# Patient Record
Sex: Female | Born: 1955 | Race: White | Hispanic: No | Smoking: Former smoker
Health system: Southern US, Community
[De-identification: ages and names within clinical notes are randomized; demographics above are authoritative.]

## PROBLEM LIST (undated history)

## (undated) DIAGNOSIS — R159 Full incontinence of feces: Principal | ICD-10-CM

## (undated) DIAGNOSIS — K219 Gastro-esophageal reflux disease without esophagitis: Secondary | ICD-10-CM

## (undated) DIAGNOSIS — M461 Sacroiliitis, not elsewhere classified: Secondary | ICD-10-CM

## (undated) DIAGNOSIS — A419 Sepsis, unspecified organism: Secondary | ICD-10-CM

## (undated) DIAGNOSIS — F17200 Nicotine dependence, unspecified, uncomplicated: Secondary | ICD-10-CM

## (undated) DIAGNOSIS — M1990 Unspecified osteoarthritis, unspecified site: Secondary | ICD-10-CM

## (undated) DIAGNOSIS — L89159 Pressure ulcer of sacral region, unspecified stage: Secondary | ICD-10-CM

## (undated) DIAGNOSIS — Z95828 Presence of other vascular implants and grafts: Secondary | ICD-10-CM

## (undated) DIAGNOSIS — R410 Disorientation, unspecified: Secondary | ICD-10-CM

## (undated) DIAGNOSIS — E119 Type 2 diabetes mellitus without complications: Secondary | ICD-10-CM

## (undated) DIAGNOSIS — I10 Essential (primary) hypertension: Secondary | ICD-10-CM

## (undated) DIAGNOSIS — Z930 Tracheostomy status: Secondary | ICD-10-CM

## (undated) DIAGNOSIS — J189 Pneumonia, unspecified organism: Secondary | ICD-10-CM

## (undated) DIAGNOSIS — M549 Dorsalgia, unspecified: Secondary | ICD-10-CM

## (undated) DIAGNOSIS — I498 Other specified cardiac arrhythmias: Secondary | ICD-10-CM

## (undated) DIAGNOSIS — S3660XA Unspecified injury of rectum, initial encounter: Secondary | ICD-10-CM

## (undated) DIAGNOSIS — R062 Wheezing: Secondary | ICD-10-CM

## (undated) DIAGNOSIS — F209 Schizophrenia, unspecified: Secondary | ICD-10-CM

## (undated) DIAGNOSIS — T148XXA Other injury of unspecified body region, initial encounter: Secondary | ICD-10-CM

## (undated) DIAGNOSIS — R6521 Severe sepsis with septic shock: Secondary | ICD-10-CM

## (undated) DIAGNOSIS — R310 Gross hematuria: Secondary | ICD-10-CM

## (undated) DIAGNOSIS — R748 Abnormal levels of other serum enzymes: Secondary | ICD-10-CM

## (undated) DIAGNOSIS — Z931 Gastrostomy status: Secondary | ICD-10-CM

## (undated) DIAGNOSIS — R079 Chest pain, unspecified: Secondary | ICD-10-CM

## (undated) DIAGNOSIS — T7840XA Allergy, unspecified, initial encounter: Secondary | ICD-10-CM

## (undated) DIAGNOSIS — N179 Acute kidney failure, unspecified: Secondary | ICD-10-CM

## (undated) DIAGNOSIS — E785 Hyperlipidemia, unspecified: Secondary | ICD-10-CM

## (undated) DIAGNOSIS — J9600 Acute respiratory failure, unspecified whether with hypoxia or hypercapnia: Secondary | ICD-10-CM

## (undated) DIAGNOSIS — R109 Unspecified abdominal pain: Secondary | ICD-10-CM

## (undated) DIAGNOSIS — F259 Schizoaffective disorder, unspecified: Secondary | ICD-10-CM

## (undated) DIAGNOSIS — M25512 Pain in left shoulder: Secondary | ICD-10-CM

## (undated) DIAGNOSIS — D638 Anemia in other chronic diseases classified elsewhere: Secondary | ICD-10-CM

## (undated) HISTORY — DX: Gastro-esophageal reflux disease without esophagitis: K21.9

## (undated) HISTORY — DX: Pneumonia, unspecified organism: J18.9

## (undated) HISTORY — DX: Presence of other vascular implants and grafts: Z95.828

## (undated) HISTORY — DX: Disorientation, unspecified: R41.0

## (undated) HISTORY — DX: Other injury of unspecified body region, initial encounter: T14.8XXA

## (undated) HISTORY — DX: Unspecified injury of rectum, initial encounter: S36.60XA

## (undated) HISTORY — DX: Schizoaffective disorder, unspecified: F25.9

## (undated) HISTORY — DX: Allergy, unspecified, initial encounter: T78.40XA

## (undated) HISTORY — DX: Full incontinence of feces: R15.9

## (undated) HISTORY — DX: Acute kidney failure, unspecified: N17.9

## (undated) HISTORY — DX: Abnormal levels of other serum enzymes: R74.8

## (undated) HISTORY — DX: Unspecified abdominal pain: R10.9

## (undated) HISTORY — DX: Chest pain, unspecified: R07.9

## (undated) HISTORY — DX: Other specified cardiac arrhythmias: I49.8

## (undated) HISTORY — DX: Gastrostomy status: Z93.1

## (undated) HISTORY — DX: Unspecified osteoarthritis, unspecified site: M19.90

## (undated) HISTORY — DX: Wheezing: R06.2

## (undated) HISTORY — DX: Anemia in other chronic diseases classified elsewhere: D63.8

## (undated) HISTORY — DX: Essential (primary) hypertension: I10

## (undated) HISTORY — DX: Pain in left shoulder: M25.512

## (undated) HISTORY — DX: Nicotine dependence, unspecified, uncomplicated: F17.200

## (undated) HISTORY — DX: Sacroiliitis, not elsewhere classified: M46.1

## (undated) HISTORY — DX: Severe sepsis with septic shock: R65.21

## (undated) HISTORY — DX: Tracheostomy status: Z93.0

## (undated) HISTORY — DX: Hyperlipidemia, unspecified: E78.5

## (undated) HISTORY — PX: TUBAL LIGATION: SHX77

## (undated) HISTORY — DX: Gross hematuria: R31.0

## (undated) HISTORY — DX: Acute respiratory failure, unspecified whether with hypoxia or hypercapnia: J96.00

## (undated) HISTORY — DX: Pressure ulcer of sacral region, unspecified stage: L89.159

## (undated) HISTORY — DX: Sepsis, unspecified organism: A41.9

---

## 1998-03-21 ENCOUNTER — Encounter: Admission: RE | Admit: 1998-03-21 | Discharge: 1998-03-21 | Payer: Self-pay | Admitting: Family Medicine

## 1998-04-06 ENCOUNTER — Encounter: Admission: RE | Admit: 1998-04-06 | Discharge: 1998-04-06 | Payer: Self-pay | Admitting: Family Medicine

## 1998-05-09 ENCOUNTER — Encounter: Admission: RE | Admit: 1998-05-09 | Discharge: 1998-05-09 | Payer: Self-pay | Admitting: Family Medicine

## 1998-06-27 ENCOUNTER — Encounter: Admission: RE | Admit: 1998-06-27 | Discharge: 1998-06-27 | Payer: Self-pay | Admitting: Family Medicine

## 1998-11-05 ENCOUNTER — Encounter: Admission: RE | Admit: 1998-11-05 | Discharge: 1998-11-05 | Payer: Self-pay | Admitting: Family Medicine

## 1998-11-08 ENCOUNTER — Encounter: Admission: RE | Admit: 1998-11-08 | Discharge: 1998-11-08 | Payer: Self-pay | Admitting: Family Medicine

## 1998-11-15 ENCOUNTER — Encounter: Admission: RE | Admit: 1998-11-15 | Discharge: 1998-11-15 | Payer: Self-pay | Admitting: Family Medicine

## 1999-04-15 ENCOUNTER — Encounter: Admission: RE | Admit: 1999-04-15 | Discharge: 1999-04-15 | Payer: Self-pay | Admitting: Family Medicine

## 1999-04-26 ENCOUNTER — Encounter: Admission: RE | Admit: 1999-04-26 | Discharge: 1999-04-26 | Payer: Self-pay | Admitting: Family Medicine

## 1999-05-20 ENCOUNTER — Encounter: Admission: RE | Admit: 1999-05-20 | Discharge: 1999-05-20 | Payer: Self-pay | Admitting: Family Medicine

## 1999-06-19 ENCOUNTER — Encounter: Admission: RE | Admit: 1999-06-19 | Discharge: 1999-06-19 | Payer: Self-pay | Admitting: Family Medicine

## 1999-07-10 ENCOUNTER — Encounter: Admission: RE | Admit: 1999-07-10 | Discharge: 1999-07-10 | Payer: Self-pay | Admitting: Family Medicine

## 1999-07-10 ENCOUNTER — Encounter: Payer: Self-pay | Admitting: Emergency Medicine

## 1999-07-11 ENCOUNTER — Encounter: Payer: Self-pay | Admitting: Family Medicine

## 1999-07-11 ENCOUNTER — Inpatient Hospital Stay (HOSPITAL_COMMUNITY): Admission: EM | Admit: 1999-07-11 | Discharge: 1999-07-12 | Payer: Self-pay | Admitting: Emergency Medicine

## 1999-07-22 ENCOUNTER — Encounter: Admission: RE | Admit: 1999-07-22 | Discharge: 1999-07-22 | Payer: Self-pay | Admitting: Family Medicine

## 1999-07-25 ENCOUNTER — Encounter: Admission: RE | Admit: 1999-07-25 | Discharge: 1999-07-25 | Payer: Self-pay | Admitting: Family Medicine

## 1999-08-08 ENCOUNTER — Encounter: Admission: RE | Admit: 1999-08-08 | Discharge: 1999-08-08 | Payer: Self-pay | Admitting: Family Medicine

## 1999-08-21 ENCOUNTER — Encounter: Admission: RE | Admit: 1999-08-21 | Discharge: 1999-08-21 | Payer: Self-pay | Admitting: Family Medicine

## 1999-09-13 ENCOUNTER — Encounter: Admission: RE | Admit: 1999-09-13 | Discharge: 1999-09-13 | Payer: Self-pay | Admitting: Sports Medicine

## 1999-09-13 ENCOUNTER — Encounter: Payer: Self-pay | Admitting: Sports Medicine

## 1999-09-13 ENCOUNTER — Encounter: Admission: RE | Admit: 1999-09-13 | Discharge: 1999-09-13 | Payer: Self-pay | Admitting: Family Medicine

## 1999-11-11 ENCOUNTER — Encounter: Admission: RE | Admit: 1999-11-11 | Discharge: 1999-11-11 | Payer: Self-pay | Admitting: Family Medicine

## 1999-12-09 ENCOUNTER — Encounter: Admission: RE | Admit: 1999-12-09 | Discharge: 1999-12-09 | Payer: Self-pay | Admitting: Family Medicine

## 1999-12-11 ENCOUNTER — Encounter: Admission: RE | Admit: 1999-12-11 | Discharge: 2000-03-10 | Payer: Self-pay | Admitting: Internal Medicine

## 1999-12-23 ENCOUNTER — Encounter: Admission: RE | Admit: 1999-12-23 | Discharge: 1999-12-23 | Payer: Self-pay | Admitting: Family Medicine

## 2000-02-05 ENCOUNTER — Encounter: Admission: RE | Admit: 2000-02-05 | Discharge: 2000-02-05 | Payer: Self-pay | Admitting: Family Medicine

## 2000-03-16 ENCOUNTER — Encounter: Admission: RE | Admit: 2000-03-16 | Discharge: 2000-03-16 | Payer: Self-pay | Admitting: Family Medicine

## 2000-06-08 ENCOUNTER — Encounter: Admission: RE | Admit: 2000-06-08 | Discharge: 2000-06-08 | Payer: Self-pay | Admitting: Family Medicine

## 2000-06-15 ENCOUNTER — Encounter: Admission: RE | Admit: 2000-06-15 | Discharge: 2000-06-15 | Payer: Self-pay | Admitting: Family Medicine

## 2000-08-12 ENCOUNTER — Encounter: Admission: RE | Admit: 2000-08-12 | Discharge: 2000-08-12 | Payer: Self-pay | Admitting: Family Medicine

## 2000-09-01 ENCOUNTER — Encounter: Admission: RE | Admit: 2000-09-01 | Discharge: 2000-09-01 | Payer: Self-pay | Admitting: Family Medicine

## 2000-09-02 ENCOUNTER — Encounter: Payer: Self-pay | Admitting: Emergency Medicine

## 2000-09-02 ENCOUNTER — Emergency Department (HOSPITAL_COMMUNITY): Admission: EM | Admit: 2000-09-02 | Discharge: 2000-09-02 | Payer: Self-pay | Admitting: Emergency Medicine

## 2000-09-16 ENCOUNTER — Encounter: Admission: RE | Admit: 2000-09-16 | Discharge: 2000-09-16 | Payer: Self-pay | Admitting: Family Medicine

## 2000-12-21 ENCOUNTER — Encounter: Admission: RE | Admit: 2000-12-21 | Discharge: 2000-12-21 | Payer: Self-pay | Admitting: Family Medicine

## 2001-03-22 ENCOUNTER — Encounter: Admission: RE | Admit: 2001-03-22 | Discharge: 2001-03-22 | Payer: Self-pay | Admitting: Family Medicine

## 2001-05-14 ENCOUNTER — Encounter: Admission: RE | Admit: 2001-05-14 | Discharge: 2001-05-14 | Payer: Self-pay | Admitting: Family Medicine

## 2001-06-25 ENCOUNTER — Encounter: Admission: RE | Admit: 2001-06-25 | Discharge: 2001-06-25 | Payer: Self-pay | Admitting: Family Medicine

## 2001-08-04 ENCOUNTER — Encounter: Admission: RE | Admit: 2001-08-04 | Discharge: 2001-08-04 | Payer: Self-pay | Admitting: Family Medicine

## 2001-08-09 ENCOUNTER — Encounter: Admission: RE | Admit: 2001-08-09 | Discharge: 2001-08-09 | Payer: Self-pay | Admitting: Family Medicine

## 2001-08-31 ENCOUNTER — Encounter: Admission: RE | Admit: 2001-08-31 | Discharge: 2001-08-31 | Payer: Self-pay | Admitting: Family Medicine

## 2001-09-08 ENCOUNTER — Encounter: Admission: RE | Admit: 2001-09-08 | Discharge: 2001-09-08 | Payer: Self-pay | Admitting: Family Medicine

## 2001-09-21 ENCOUNTER — Encounter: Admission: RE | Admit: 2001-09-21 | Discharge: 2001-09-21 | Payer: Self-pay | Admitting: Family Medicine

## 2001-09-27 ENCOUNTER — Encounter: Admission: RE | Admit: 2001-09-27 | Discharge: 2001-09-27 | Payer: Self-pay | Admitting: Family Medicine

## 2001-12-14 ENCOUNTER — Encounter: Admission: RE | Admit: 2001-12-14 | Discharge: 2001-12-14 | Payer: Self-pay | Admitting: Family Medicine

## 2002-01-18 ENCOUNTER — Encounter: Admission: RE | Admit: 2002-01-18 | Discharge: 2002-01-18 | Payer: Self-pay | Admitting: Family Medicine

## 2002-01-26 ENCOUNTER — Encounter: Admission: RE | Admit: 2002-01-26 | Discharge: 2002-04-26 | Payer: Self-pay | Admitting: *Deleted

## 2002-04-19 ENCOUNTER — Emergency Department (HOSPITAL_COMMUNITY): Admission: EM | Admit: 2002-04-19 | Discharge: 2002-04-19 | Payer: Self-pay | Admitting: Emergency Medicine

## 2002-04-19 ENCOUNTER — Encounter: Payer: Self-pay | Admitting: Emergency Medicine

## 2002-05-16 ENCOUNTER — Encounter: Admission: RE | Admit: 2002-05-16 | Discharge: 2002-05-16 | Payer: Self-pay | Admitting: Family Medicine

## 2002-08-17 ENCOUNTER — Encounter: Admission: RE | Admit: 2002-08-17 | Discharge: 2002-08-17 | Payer: Self-pay | Admitting: Family Medicine

## 2002-11-17 ENCOUNTER — Encounter: Admission: RE | Admit: 2002-11-17 | Discharge: 2002-11-17 | Payer: Self-pay | Admitting: Family Medicine

## 2003-02-15 ENCOUNTER — Encounter: Admission: RE | Admit: 2003-02-15 | Discharge: 2003-02-15 | Payer: Self-pay | Admitting: Sports Medicine

## 2003-02-21 ENCOUNTER — Encounter: Admission: RE | Admit: 2003-02-21 | Discharge: 2003-02-21 | Payer: Self-pay | Admitting: Family Medicine

## 2003-03-07 ENCOUNTER — Encounter: Admission: RE | Admit: 2003-03-07 | Discharge: 2003-03-07 | Payer: Self-pay | Admitting: Family Medicine

## 2003-03-17 ENCOUNTER — Encounter: Admission: RE | Admit: 2003-03-17 | Discharge: 2003-03-17 | Payer: Self-pay | Admitting: Sports Medicine

## 2003-04-19 ENCOUNTER — Encounter: Admission: RE | Admit: 2003-04-19 | Discharge: 2003-04-19 | Payer: Self-pay | Admitting: Family Medicine

## 2003-04-20 ENCOUNTER — Encounter: Payer: Self-pay | Admitting: Sports Medicine

## 2003-04-20 ENCOUNTER — Encounter: Admission: RE | Admit: 2003-04-20 | Discharge: 2003-04-20 | Payer: Self-pay | Admitting: Sports Medicine

## 2003-04-24 ENCOUNTER — Inpatient Hospital Stay (HOSPITAL_COMMUNITY): Admission: EM | Admit: 2003-04-24 | Discharge: 2003-04-30 | Payer: Self-pay

## 2003-04-24 ENCOUNTER — Encounter: Payer: Self-pay | Admitting: Family Medicine

## 2003-04-25 ENCOUNTER — Encounter: Payer: Self-pay | Admitting: Family Medicine

## 2003-04-28 ENCOUNTER — Encounter: Payer: Self-pay | Admitting: General Surgery

## 2003-04-28 ENCOUNTER — Encounter (INDEPENDENT_AMBULATORY_CARE_PROVIDER_SITE_OTHER): Payer: Self-pay | Admitting: Specialist

## 2003-05-09 ENCOUNTER — Encounter: Admission: RE | Admit: 2003-05-09 | Discharge: 2003-05-09 | Payer: Self-pay | Admitting: Family Medicine

## 2003-05-13 ENCOUNTER — Emergency Department (HOSPITAL_COMMUNITY): Admission: EM | Admit: 2003-05-13 | Discharge: 2003-05-13 | Payer: Self-pay | Admitting: Emergency Medicine

## 2003-05-20 ENCOUNTER — Inpatient Hospital Stay (HOSPITAL_COMMUNITY): Admission: AD | Admit: 2003-05-20 | Discharge: 2003-05-31 | Payer: Self-pay | Admitting: Psychiatry

## 2003-07-25 ENCOUNTER — Encounter: Admission: RE | Admit: 2003-07-25 | Discharge: 2003-07-25 | Payer: Self-pay | Admitting: Family Medicine

## 2003-08-01 ENCOUNTER — Encounter: Admission: RE | Admit: 2003-08-01 | Discharge: 2003-08-01 | Payer: Self-pay | Admitting: Family Medicine

## 2003-08-14 ENCOUNTER — Emergency Department (HOSPITAL_COMMUNITY): Admission: EM | Admit: 2003-08-14 | Discharge: 2003-08-14 | Payer: Self-pay | Admitting: Emergency Medicine

## 2003-08-25 ENCOUNTER — Encounter: Payer: Self-pay | Admitting: Sports Medicine

## 2003-08-25 ENCOUNTER — Encounter: Admission: RE | Admit: 2003-08-25 | Discharge: 2003-08-25 | Payer: Self-pay | Admitting: Sports Medicine

## 2003-11-13 ENCOUNTER — Encounter: Admission: RE | Admit: 2003-11-13 | Discharge: 2003-11-13 | Payer: Self-pay | Admitting: Family Medicine

## 2003-12-12 ENCOUNTER — Encounter: Admission: RE | Admit: 2003-12-12 | Discharge: 2003-12-12 | Payer: Self-pay | Admitting: Family Medicine

## 2003-12-19 ENCOUNTER — Encounter: Admission: RE | Admit: 2003-12-19 | Discharge: 2003-12-19 | Payer: Self-pay | Admitting: Family Medicine

## 2004-01-25 ENCOUNTER — Encounter: Admission: RE | Admit: 2004-01-25 | Discharge: 2004-01-25 | Payer: Self-pay | Admitting: Sports Medicine

## 2004-05-15 ENCOUNTER — Encounter: Admission: RE | Admit: 2004-05-15 | Discharge: 2004-05-15 | Payer: Self-pay | Admitting: Family Medicine

## 2004-05-30 ENCOUNTER — Encounter: Admission: RE | Admit: 2004-05-30 | Discharge: 2004-05-30 | Payer: Self-pay | Admitting: Family Medicine

## 2004-08-08 ENCOUNTER — Ambulatory Visit: Payer: Self-pay | Admitting: Family Medicine

## 2004-09-11 ENCOUNTER — Ambulatory Visit: Payer: Self-pay | Admitting: Family Medicine

## 2004-11-08 ENCOUNTER — Encounter (INDEPENDENT_AMBULATORY_CARE_PROVIDER_SITE_OTHER): Payer: Self-pay | Admitting: *Deleted

## 2004-11-08 LAB — CONVERTED CEMR LAB

## 2004-11-21 ENCOUNTER — Ambulatory Visit: Payer: Self-pay | Admitting: Family Medicine

## 2005-04-07 ENCOUNTER — Ambulatory Visit: Payer: Self-pay | Admitting: Family Medicine

## 2005-06-09 ENCOUNTER — Ambulatory Visit: Payer: Self-pay | Admitting: Family Medicine

## 2005-08-11 ENCOUNTER — Ambulatory Visit: Payer: Self-pay | Admitting: Family Medicine

## 2005-12-16 ENCOUNTER — Ambulatory Visit: Payer: Self-pay | Admitting: Sports Medicine

## 2006-04-08 ENCOUNTER — Ambulatory Visit: Payer: Self-pay | Admitting: Family Medicine

## 2006-06-29 ENCOUNTER — Ambulatory Visit: Payer: Self-pay | Admitting: Family Medicine

## 2006-07-20 ENCOUNTER — Encounter: Admission: RE | Admit: 2006-07-20 | Discharge: 2006-07-20 | Payer: Self-pay | Admitting: Sports Medicine

## 2006-07-20 ENCOUNTER — Ambulatory Visit: Payer: Self-pay | Admitting: Family Medicine

## 2006-08-20 ENCOUNTER — Ambulatory Visit: Payer: Self-pay | Admitting: Family Medicine

## 2006-12-31 DIAGNOSIS — E11319 Type 2 diabetes mellitus with unspecified diabetic retinopathy without macular edema: Secondary | ICD-10-CM | POA: Insufficient documentation

## 2006-12-31 DIAGNOSIS — F17200 Nicotine dependence, unspecified, uncomplicated: Secondary | ICD-10-CM

## 2006-12-31 DIAGNOSIS — K219 Gastro-esophageal reflux disease without esophagitis: Secondary | ICD-10-CM

## 2006-12-31 DIAGNOSIS — K449 Diaphragmatic hernia without obstruction or gangrene: Secondary | ICD-10-CM | POA: Insufficient documentation

## 2006-12-31 DIAGNOSIS — E669 Obesity, unspecified: Secondary | ICD-10-CM

## 2006-12-31 DIAGNOSIS — N3941 Urge incontinence: Secondary | ICD-10-CM

## 2006-12-31 DIAGNOSIS — F259 Schizoaffective disorder, unspecified: Secondary | ICD-10-CM

## 2006-12-31 DIAGNOSIS — I10 Essential (primary) hypertension: Secondary | ICD-10-CM

## 2006-12-31 HISTORY — DX: Schizoaffective disorder, unspecified: F25.9

## 2006-12-31 HISTORY — DX: Nicotine dependence, unspecified, uncomplicated: F17.200

## 2007-01-01 ENCOUNTER — Encounter (INDEPENDENT_AMBULATORY_CARE_PROVIDER_SITE_OTHER): Payer: Self-pay | Admitting: *Deleted

## 2007-02-11 ENCOUNTER — Encounter (INDEPENDENT_AMBULATORY_CARE_PROVIDER_SITE_OTHER): Payer: Self-pay | Admitting: Family Medicine

## 2007-02-11 DIAGNOSIS — E11319 Type 2 diabetes mellitus with unspecified diabetic retinopathy without macular edema: Secondary | ICD-10-CM

## 2007-02-11 DIAGNOSIS — E113299 Type 2 diabetes mellitus with mild nonproliferative diabetic retinopathy without macular edema, unspecified eye: Secondary | ICD-10-CM | POA: Insufficient documentation

## 2007-02-12 ENCOUNTER — Telehealth: Payer: Self-pay | Admitting: *Deleted

## 2007-02-17 ENCOUNTER — Encounter (INDEPENDENT_AMBULATORY_CARE_PROVIDER_SITE_OTHER): Payer: Self-pay | Admitting: Family Medicine

## 2007-02-17 ENCOUNTER — Ambulatory Visit: Payer: Self-pay | Admitting: Family Medicine

## 2007-02-18 ENCOUNTER — Encounter (INDEPENDENT_AMBULATORY_CARE_PROVIDER_SITE_OTHER): Payer: Self-pay | Admitting: Family Medicine

## 2007-02-18 LAB — CONVERTED CEMR LAB: Triglycerides: 277 mg/dL — ABNORMAL HIGH (ref <150)

## 2007-02-19 ENCOUNTER — Telehealth (INDEPENDENT_AMBULATORY_CARE_PROVIDER_SITE_OTHER): Payer: Self-pay | Admitting: Family Medicine

## 2007-03-30 ENCOUNTER — Telehealth: Payer: Self-pay | Admitting: *Deleted

## 2007-03-31 ENCOUNTER — Telehealth: Payer: Self-pay | Admitting: *Deleted

## 2007-04-01 ENCOUNTER — Ambulatory Visit: Payer: Self-pay | Admitting: Family Medicine

## 2007-04-14 ENCOUNTER — Telehealth: Payer: Self-pay | Admitting: *Deleted

## 2007-04-20 ENCOUNTER — Encounter (INDEPENDENT_AMBULATORY_CARE_PROVIDER_SITE_OTHER): Payer: Self-pay | Admitting: Family Medicine

## 2007-04-20 ENCOUNTER — Ambulatory Visit: Payer: Self-pay | Admitting: Sports Medicine

## 2007-05-21 ENCOUNTER — Telehealth: Payer: Self-pay | Admitting: *Deleted

## 2007-06-22 ENCOUNTER — Encounter (INDEPENDENT_AMBULATORY_CARE_PROVIDER_SITE_OTHER): Payer: Self-pay | Admitting: *Deleted

## 2007-06-22 ENCOUNTER — Ambulatory Visit: Payer: Self-pay | Admitting: Family Medicine

## 2007-06-22 LAB — CONVERTED CEMR LAB
AST: 21 units/L (ref 0–37)
BUN: 9 mg/dL (ref 6–23)
Calcium: 10.1 mg/dL (ref 8.4–10.5)
Creatinine, Ser: 0.86 mg/dL (ref 0.40–1.20)
Hgb A1c MFr Bld: 6 %
Potassium: 4.5 meq/L (ref 3.5–5.3)
Total Bilirubin: 0.3 mg/dL (ref 0.3–1.2)
Total Protein: 7 g/dL (ref 6.0–8.3)

## 2007-06-24 ENCOUNTER — Telehealth: Payer: Self-pay | Admitting: *Deleted

## 2007-08-27 ENCOUNTER — Ambulatory Visit: Payer: Self-pay | Admitting: Family Medicine

## 2007-08-30 ENCOUNTER — Telehealth (INDEPENDENT_AMBULATORY_CARE_PROVIDER_SITE_OTHER): Payer: Self-pay | Admitting: Family Medicine

## 2007-08-31 ENCOUNTER — Telehealth: Payer: Self-pay | Admitting: *Deleted

## 2007-09-10 ENCOUNTER — Ambulatory Visit: Payer: Self-pay | Admitting: Family Medicine

## 2007-09-14 ENCOUNTER — Ambulatory Visit: Payer: Self-pay | Admitting: Family Medicine

## 2007-09-14 ENCOUNTER — Encounter (INDEPENDENT_AMBULATORY_CARE_PROVIDER_SITE_OTHER): Payer: Self-pay | Admitting: *Deleted

## 2007-09-15 ENCOUNTER — Encounter (INDEPENDENT_AMBULATORY_CARE_PROVIDER_SITE_OTHER): Payer: Self-pay | Admitting: *Deleted

## 2007-09-15 LAB — CONVERTED CEMR LAB
Cholesterol: 206 mg/dL — ABNORMAL HIGH (ref 0–200)
VLDL: 52 mg/dL — ABNORMAL HIGH (ref 0–40)

## 2007-09-20 ENCOUNTER — Ambulatory Visit: Payer: Self-pay | Admitting: Family Medicine

## 2007-09-20 ENCOUNTER — Encounter (INDEPENDENT_AMBULATORY_CARE_PROVIDER_SITE_OTHER): Payer: Self-pay | Admitting: Family Medicine

## 2007-09-20 LAB — CONVERTED CEMR LAB
Glucose, Urine, Semiquant: NEGATIVE
pH: 6

## 2007-10-05 ENCOUNTER — Telehealth (INDEPENDENT_AMBULATORY_CARE_PROVIDER_SITE_OTHER): Payer: Self-pay | Admitting: *Deleted

## 2007-10-12 ENCOUNTER — Ambulatory Visit: Payer: Self-pay | Admitting: Sports Medicine

## 2007-10-12 DIAGNOSIS — M1710 Unilateral primary osteoarthritis, unspecified knee: Secondary | ICD-10-CM

## 2007-10-14 ENCOUNTER — Emergency Department (HOSPITAL_COMMUNITY): Admission: EM | Admit: 2007-10-14 | Discharge: 2007-10-14 | Payer: Self-pay | Admitting: Emergency Medicine

## 2007-10-19 ENCOUNTER — Telehealth (INDEPENDENT_AMBULATORY_CARE_PROVIDER_SITE_OTHER): Payer: Self-pay | Admitting: *Deleted

## 2007-10-19 ENCOUNTER — Encounter (INDEPENDENT_AMBULATORY_CARE_PROVIDER_SITE_OTHER): Payer: Self-pay | Admitting: *Deleted

## 2007-10-20 ENCOUNTER — Ambulatory Visit: Payer: Self-pay | Admitting: Family Medicine

## 2007-10-20 ENCOUNTER — Telehealth: Payer: Self-pay | Admitting: *Deleted

## 2007-10-22 ENCOUNTER — Telehealth (INDEPENDENT_AMBULATORY_CARE_PROVIDER_SITE_OTHER): Payer: Self-pay | Admitting: *Deleted

## 2007-10-26 ENCOUNTER — Encounter (INDEPENDENT_AMBULATORY_CARE_PROVIDER_SITE_OTHER): Payer: Self-pay | Admitting: *Deleted

## 2007-10-26 ENCOUNTER — Ambulatory Visit: Payer: Self-pay | Admitting: Sports Medicine

## 2007-10-26 LAB — CONVERTED CEMR LAB
Bilirubin Urine: NEGATIVE
Ketones, urine, test strip: NEGATIVE
Nitrite: NEGATIVE
Pap Smear: NORMAL
Protein, U semiquant: NEGATIVE
Specific Gravity, Urine: 1.01
Urobilinogen, UA: 0.2

## 2007-10-29 ENCOUNTER — Telehealth: Payer: Self-pay | Admitting: *Deleted

## 2007-11-02 ENCOUNTER — Encounter: Admission: RE | Admit: 2007-11-02 | Discharge: 2007-11-02 | Payer: Self-pay | Admitting: *Deleted

## 2007-11-03 ENCOUNTER — Telehealth (INDEPENDENT_AMBULATORY_CARE_PROVIDER_SITE_OTHER): Payer: Self-pay | Admitting: *Deleted

## 2007-11-03 ENCOUNTER — Telehealth: Payer: Self-pay | Admitting: *Deleted

## 2007-11-08 ENCOUNTER — Encounter (INDEPENDENT_AMBULATORY_CARE_PROVIDER_SITE_OTHER): Payer: Self-pay | Admitting: *Deleted

## 2007-11-08 ENCOUNTER — Telehealth (INDEPENDENT_AMBULATORY_CARE_PROVIDER_SITE_OTHER): Payer: Self-pay | Admitting: *Deleted

## 2007-11-09 ENCOUNTER — Telehealth: Payer: Self-pay | Admitting: *Deleted

## 2007-11-24 ENCOUNTER — Ambulatory Visit: Payer: Self-pay | Admitting: Family Medicine

## 2007-11-29 ENCOUNTER — Telehealth (INDEPENDENT_AMBULATORY_CARE_PROVIDER_SITE_OTHER): Payer: Self-pay | Admitting: Family Medicine

## 2007-12-06 ENCOUNTER — Telehealth (INDEPENDENT_AMBULATORY_CARE_PROVIDER_SITE_OTHER): Payer: Self-pay | Admitting: *Deleted

## 2007-12-20 ENCOUNTER — Ambulatory Visit: Payer: Self-pay | Admitting: Sports Medicine

## 2007-12-20 ENCOUNTER — Encounter (INDEPENDENT_AMBULATORY_CARE_PROVIDER_SITE_OTHER): Payer: Self-pay | Admitting: Family Medicine

## 2007-12-20 LAB — CONVERTED CEMR LAB
Bilirubin Urine: NEGATIVE
Blood in Urine, dipstick: NEGATIVE
Ketones, urine, test strip: NEGATIVE

## 2008-01-11 ENCOUNTER — Telehealth (INDEPENDENT_AMBULATORY_CARE_PROVIDER_SITE_OTHER): Payer: Self-pay | Admitting: Family Medicine

## 2008-01-12 ENCOUNTER — Encounter (INDEPENDENT_AMBULATORY_CARE_PROVIDER_SITE_OTHER): Payer: Self-pay | Admitting: *Deleted

## 2008-01-21 ENCOUNTER — Telehealth (INDEPENDENT_AMBULATORY_CARE_PROVIDER_SITE_OTHER): Payer: Self-pay | Admitting: *Deleted

## 2008-02-16 ENCOUNTER — Encounter (INDEPENDENT_AMBULATORY_CARE_PROVIDER_SITE_OTHER): Payer: Self-pay | Admitting: *Deleted

## 2008-02-16 ENCOUNTER — Ambulatory Visit: Payer: Self-pay | Admitting: Family Medicine

## 2008-02-16 DIAGNOSIS — E785 Hyperlipidemia, unspecified: Secondary | ICD-10-CM

## 2008-02-17 ENCOUNTER — Encounter (INDEPENDENT_AMBULATORY_CARE_PROVIDER_SITE_OTHER): Payer: Self-pay | Admitting: *Deleted

## 2008-02-17 LAB — CONVERTED CEMR LAB
AST: 20 units/L (ref 0–37)
Albumin: 4.7 g/dL (ref 3.5–5.2)
Alkaline Phosphatase: 67 units/L (ref 39–117)
BUN: 19 mg/dL (ref 6–23)
Calcium: 9.5 mg/dL (ref 8.4–10.5)
Glucose, Bld: 86 mg/dL (ref 70–99)
Potassium: 4.4 meq/L (ref 3.5–5.3)
Sodium: 136 meq/L (ref 135–145)
Total Protein: 7.3 g/dL (ref 6.0–8.3)

## 2008-02-22 ENCOUNTER — Telehealth: Payer: Self-pay | Admitting: *Deleted

## 2008-02-23 ENCOUNTER — Encounter (INDEPENDENT_AMBULATORY_CARE_PROVIDER_SITE_OTHER): Payer: Self-pay | Admitting: *Deleted

## 2008-02-24 ENCOUNTER — Encounter (INDEPENDENT_AMBULATORY_CARE_PROVIDER_SITE_OTHER): Payer: Self-pay | Admitting: *Deleted

## 2008-02-28 ENCOUNTER — Ambulatory Visit: Payer: Self-pay | Admitting: Family Medicine

## 2008-02-28 ENCOUNTER — Telehealth (INDEPENDENT_AMBULATORY_CARE_PROVIDER_SITE_OTHER): Payer: Self-pay | Admitting: *Deleted

## 2008-03-01 ENCOUNTER — Telehealth (INDEPENDENT_AMBULATORY_CARE_PROVIDER_SITE_OTHER): Payer: Self-pay | Admitting: *Deleted

## 2008-03-13 ENCOUNTER — Telehealth: Payer: Self-pay | Admitting: *Deleted

## 2008-03-13 ENCOUNTER — Telehealth (INDEPENDENT_AMBULATORY_CARE_PROVIDER_SITE_OTHER): Payer: Self-pay | Admitting: Family Medicine

## 2008-03-14 ENCOUNTER — Ambulatory Visit: Payer: Self-pay | Admitting: Family Medicine

## 2008-03-28 ENCOUNTER — Encounter: Payer: Self-pay | Admitting: *Deleted

## 2008-03-30 ENCOUNTER — Telehealth (INDEPENDENT_AMBULATORY_CARE_PROVIDER_SITE_OTHER): Payer: Self-pay | Admitting: *Deleted

## 2008-04-07 ENCOUNTER — Telehealth: Payer: Self-pay | Admitting: *Deleted

## 2008-04-10 ENCOUNTER — Telehealth: Payer: Self-pay | Admitting: *Deleted

## 2008-04-13 ENCOUNTER — Telehealth (INDEPENDENT_AMBULATORY_CARE_PROVIDER_SITE_OTHER): Payer: Self-pay | Admitting: Family Medicine

## 2008-04-18 ENCOUNTER — Ambulatory Visit: Payer: Self-pay | Admitting: Family Medicine

## 2008-04-24 ENCOUNTER — Encounter (INDEPENDENT_AMBULATORY_CARE_PROVIDER_SITE_OTHER): Payer: Self-pay | Admitting: *Deleted

## 2008-05-08 ENCOUNTER — Ambulatory Visit: Payer: Self-pay | Admitting: Family Medicine

## 2008-06-08 ENCOUNTER — Telehealth: Payer: Self-pay | Admitting: *Deleted

## 2008-07-17 ENCOUNTER — Ambulatory Visit: Payer: Self-pay | Admitting: Sports Medicine

## 2008-08-16 ENCOUNTER — Encounter: Payer: Self-pay | Admitting: Family Medicine

## 2008-08-16 ENCOUNTER — Ambulatory Visit: Payer: Self-pay | Admitting: Family Medicine

## 2008-08-16 LAB — CONVERTED CEMR LAB
Bilirubin Urine: NEGATIVE
Blood in Urine, dipstick: NEGATIVE
Direct LDL: 123 mg/dL — ABNORMAL HIGH
Glucose, Urine, Semiquant: NEGATIVE
Hgb A1c MFr Bld: 6.7 %
Nitrite: NEGATIVE
Protein, U semiquant: NEGATIVE
Specific Gravity, Urine: 1.01

## 2008-08-17 ENCOUNTER — Encounter: Payer: Self-pay | Admitting: Family Medicine

## 2008-08-17 LAB — CONVERTED CEMR LAB: LDL Cholesterol: 123 mg/dL

## 2008-09-04 ENCOUNTER — Telehealth (INDEPENDENT_AMBULATORY_CARE_PROVIDER_SITE_OTHER): Payer: Self-pay | Admitting: *Deleted

## 2008-09-07 ENCOUNTER — Ambulatory Visit: Payer: Self-pay | Admitting: Family Medicine

## 2008-09-07 ENCOUNTER — Telehealth: Payer: Self-pay | Admitting: Family Medicine

## 2008-09-27 ENCOUNTER — Telehealth: Payer: Self-pay | Admitting: *Deleted

## 2008-12-18 ENCOUNTER — Telehealth: Payer: Self-pay | Admitting: Family Medicine

## 2008-12-19 ENCOUNTER — Telehealth: Payer: Self-pay | Admitting: Family Medicine

## 2008-12-20 ENCOUNTER — Telehealth: Payer: Self-pay | Admitting: *Deleted

## 2008-12-22 ENCOUNTER — Telehealth (INDEPENDENT_AMBULATORY_CARE_PROVIDER_SITE_OTHER): Payer: Self-pay | Admitting: *Deleted

## 2009-01-01 ENCOUNTER — Encounter: Payer: Self-pay | Admitting: Family Medicine

## 2009-01-01 ENCOUNTER — Telehealth: Payer: Self-pay | Admitting: Family Medicine

## 2009-01-05 ENCOUNTER — Telehealth (INDEPENDENT_AMBULATORY_CARE_PROVIDER_SITE_OTHER): Payer: Self-pay | Admitting: *Deleted

## 2009-01-05 ENCOUNTER — Ambulatory Visit: Payer: Self-pay | Admitting: Family Medicine

## 2009-01-08 ENCOUNTER — Telehealth: Payer: Self-pay | Admitting: Family Medicine

## 2009-01-15 ENCOUNTER — Ambulatory Visit: Payer: Self-pay | Admitting: Family Medicine

## 2009-01-19 ENCOUNTER — Telehealth (INDEPENDENT_AMBULATORY_CARE_PROVIDER_SITE_OTHER): Payer: Self-pay | Admitting: *Deleted

## 2009-01-24 ENCOUNTER — Ambulatory Visit: Payer: Self-pay | Admitting: Family Medicine

## 2009-01-24 LAB — CONVERTED CEMR LAB
Blood in Urine, dipstick: NEGATIVE
Ketones, urine, test strip: NEGATIVE
Nitrite: NEGATIVE
WBC Urine, dipstick: NEGATIVE
pH: 6.5

## 2009-01-31 ENCOUNTER — Telehealth: Payer: Self-pay | Admitting: Family Medicine

## 2009-01-31 ENCOUNTER — Encounter: Payer: Self-pay | Admitting: Family Medicine

## 2009-02-03 ENCOUNTER — Emergency Department (HOSPITAL_COMMUNITY): Admission: EM | Admit: 2009-02-03 | Discharge: 2009-02-03 | Payer: Self-pay | Admitting: Family Medicine

## 2009-02-06 ENCOUNTER — Telehealth (INDEPENDENT_AMBULATORY_CARE_PROVIDER_SITE_OTHER): Payer: Self-pay | Admitting: *Deleted

## 2009-02-07 ENCOUNTER — Telehealth (INDEPENDENT_AMBULATORY_CARE_PROVIDER_SITE_OTHER): Payer: Self-pay | Admitting: *Deleted

## 2009-02-12 ENCOUNTER — Ambulatory Visit: Payer: Self-pay | Admitting: Sports Medicine

## 2009-02-20 ENCOUNTER — Encounter: Payer: Self-pay | Admitting: Family Medicine

## 2009-02-27 ENCOUNTER — Telehealth: Payer: Self-pay | Admitting: Family Medicine

## 2009-03-01 ENCOUNTER — Ambulatory Visit: Payer: Self-pay | Admitting: Family Medicine

## 2009-03-21 ENCOUNTER — Encounter: Payer: Self-pay | Admitting: Family Medicine

## 2009-03-23 ENCOUNTER — Encounter: Payer: Self-pay | Admitting: Family Medicine

## 2009-04-12 ENCOUNTER — Telehealth: Payer: Self-pay | Admitting: Family Medicine

## 2009-04-16 ENCOUNTER — Telehealth (INDEPENDENT_AMBULATORY_CARE_PROVIDER_SITE_OTHER): Payer: Self-pay | Admitting: *Deleted

## 2009-04-25 ENCOUNTER — Telehealth: Payer: Self-pay | Admitting: Family Medicine

## 2009-05-11 ENCOUNTER — Ambulatory Visit: Payer: Self-pay | Admitting: Family Medicine

## 2009-05-11 ENCOUNTER — Encounter: Payer: Self-pay | Admitting: Family Medicine

## 2009-05-11 LAB — CONVERTED CEMR LAB
ALT: 18 units/L (ref 0–35)
Alkaline Phosphatase: 87 units/L (ref 39–117)
BUN: 7 mg/dL (ref 6–23)
Bilirubin Urine: NEGATIVE
CO2: 23 meq/L (ref 19–32)
Glucose, Bld: 96 mg/dL (ref 70–99)
Hgb A1c MFr Bld: 6.9 %
Ketones, urine, test strip: NEGATIVE
Potassium: 4.5 meq/L (ref 3.5–5.3)
Protein, U semiquant: NEGATIVE
Sodium: 134 meq/L — ABNORMAL LOW (ref 135–145)
Specific Gravity, Urine: 1.01
Urobilinogen, UA: 0.2
pH: 7

## 2009-05-14 ENCOUNTER — Telehealth: Payer: Self-pay | Admitting: Family Medicine

## 2009-05-15 ENCOUNTER — Encounter: Payer: Self-pay | Admitting: Family Medicine

## 2009-05-21 ENCOUNTER — Telehealth (INDEPENDENT_AMBULATORY_CARE_PROVIDER_SITE_OTHER): Payer: Self-pay | Admitting: *Deleted

## 2009-05-24 ENCOUNTER — Telehealth (INDEPENDENT_AMBULATORY_CARE_PROVIDER_SITE_OTHER): Payer: Self-pay | Admitting: *Deleted

## 2009-07-24 ENCOUNTER — Telehealth: Payer: Self-pay | Admitting: Family Medicine

## 2009-08-02 ENCOUNTER — Ambulatory Visit: Payer: Self-pay | Admitting: Family Medicine

## 2009-08-02 LAB — CONVERTED CEMR LAB: Hgb A1c MFr Bld: 7.1 %

## 2009-08-06 ENCOUNTER — Telehealth: Payer: Self-pay | Admitting: Family Medicine

## 2009-08-22 ENCOUNTER — Ambulatory Visit: Payer: Self-pay | Admitting: Family Medicine

## 2009-09-13 ENCOUNTER — Telehealth: Payer: Self-pay | Admitting: Family Medicine

## 2009-10-15 ENCOUNTER — Ambulatory Visit: Payer: Self-pay | Admitting: Family Medicine

## 2009-10-15 ENCOUNTER — Encounter: Payer: Self-pay | Admitting: Family Medicine

## 2009-10-15 LAB — CONVERTED CEMR LAB
Bilirubin Urine: NEGATIVE
Glucose, Urine, Semiquant: NEGATIVE
Ketones, urine, test strip: NEGATIVE
Specific Gravity, Urine: 1.01

## 2009-12-12 ENCOUNTER — Encounter: Payer: Self-pay | Admitting: Family Medicine

## 2009-12-20 ENCOUNTER — Encounter: Payer: Self-pay | Admitting: Family Medicine

## 2009-12-20 ENCOUNTER — Ambulatory Visit: Payer: Self-pay | Admitting: Family Medicine

## 2009-12-20 LAB — CONVERTED CEMR LAB
Albumin: 4.7 g/dL (ref 3.5–5.2)
BUN: 8 mg/dL (ref 6–23)
CO2: 21 meq/L (ref 19–32)
Hgb A1c MFr Bld: 7 %
Total Bilirubin: 0.2 mg/dL — ABNORMAL LOW (ref 0.3–1.2)
Total Protein: 6.9 g/dL (ref 6.0–8.3)

## 2009-12-25 ENCOUNTER — Telehealth: Payer: Self-pay | Admitting: Family Medicine

## 2009-12-31 ENCOUNTER — Encounter: Payer: Self-pay | Admitting: Family Medicine

## 2010-01-14 ENCOUNTER — Telehealth (INDEPENDENT_AMBULATORY_CARE_PROVIDER_SITE_OTHER): Payer: Self-pay | Admitting: Family Medicine

## 2010-01-15 ENCOUNTER — Telehealth (INDEPENDENT_AMBULATORY_CARE_PROVIDER_SITE_OTHER): Payer: Self-pay | Admitting: *Deleted

## 2010-01-29 ENCOUNTER — Telehealth: Payer: Self-pay | Admitting: *Deleted

## 2010-02-14 ENCOUNTER — Telehealth: Payer: Self-pay | Admitting: Family Medicine

## 2010-02-15 ENCOUNTER — Encounter: Payer: Self-pay | Admitting: Family Medicine

## 2010-02-15 ENCOUNTER — Ambulatory Visit (HOSPITAL_COMMUNITY): Admission: RE | Admit: 2010-02-15 | Discharge: 2010-02-15 | Payer: Self-pay | Admitting: Family Medicine

## 2010-02-15 ENCOUNTER — Ambulatory Visit: Payer: Self-pay | Admitting: Family Medicine

## 2010-02-15 DIAGNOSIS — I498 Other specified cardiac arrhythmias: Secondary | ICD-10-CM

## 2010-02-15 HISTORY — DX: Other specified cardiac arrhythmias: I49.8

## 2010-02-15 LAB — CONVERTED CEMR LAB
Hemoglobin: 15.1 g/dL
TSH: 2.544 microintl units/mL (ref 0.350–4.500)

## 2010-02-18 ENCOUNTER — Telehealth: Payer: Self-pay | Admitting: *Deleted

## 2010-02-20 ENCOUNTER — Encounter: Payer: Self-pay | Admitting: Family Medicine

## 2010-02-26 ENCOUNTER — Telehealth: Payer: Self-pay | Admitting: Family Medicine

## 2010-03-04 ENCOUNTER — Telehealth: Payer: Self-pay | Admitting: Family Medicine

## 2010-03-04 ENCOUNTER — Telehealth (INDEPENDENT_AMBULATORY_CARE_PROVIDER_SITE_OTHER): Payer: Self-pay | Admitting: *Deleted

## 2010-03-05 ENCOUNTER — Encounter: Payer: Self-pay | Admitting: Family Medicine

## 2010-03-07 ENCOUNTER — Telehealth: Payer: Self-pay | Admitting: Family Medicine

## 2010-03-12 ENCOUNTER — Ambulatory Visit: Payer: Self-pay | Admitting: Family Medicine

## 2010-03-13 ENCOUNTER — Telehealth: Payer: Self-pay | Admitting: *Deleted

## 2010-03-19 ENCOUNTER — Telehealth: Payer: Self-pay | Admitting: Family Medicine

## 2010-04-02 ENCOUNTER — Emergency Department (HOSPITAL_COMMUNITY): Admission: EM | Admit: 2010-04-02 | Discharge: 2010-04-02 | Payer: Self-pay | Admitting: Emergency Medicine

## 2010-04-02 ENCOUNTER — Telehealth: Payer: Self-pay | Admitting: Family Medicine

## 2010-04-03 ENCOUNTER — Telehealth: Payer: Self-pay | Admitting: Family Medicine

## 2010-04-08 ENCOUNTER — Ambulatory Visit: Payer: Self-pay | Admitting: Family Medicine

## 2010-04-08 DIAGNOSIS — N8111 Cystocele, midline: Secondary | ICD-10-CM

## 2010-04-08 LAB — CONVERTED CEMR LAB: Hgb A1c MFr Bld: 7.1 %

## 2010-04-11 ENCOUNTER — Telehealth: Payer: Self-pay | Admitting: Family Medicine

## 2010-04-12 ENCOUNTER — Telehealth: Payer: Self-pay | Admitting: Family Medicine

## 2010-04-15 ENCOUNTER — Telehealth: Payer: Self-pay | Admitting: *Deleted

## 2010-04-17 ENCOUNTER — Encounter: Payer: Self-pay | Admitting: *Deleted

## 2010-04-18 ENCOUNTER — Ambulatory Visit: Payer: Self-pay | Admitting: Family Medicine

## 2010-04-18 ENCOUNTER — Encounter: Payer: Self-pay | Admitting: Family Medicine

## 2010-04-18 ENCOUNTER — Telehealth: Payer: Self-pay | Admitting: *Deleted

## 2010-04-18 LAB — CONVERTED CEMR LAB
Beta hcg, urine, semiquantitative: NEGATIVE
Chlamydia, DNA Probe: NEGATIVE
Whiff Test: NEGATIVE

## 2010-04-19 ENCOUNTER — Telehealth: Payer: Self-pay | Admitting: *Deleted

## 2010-04-20 ENCOUNTER — Emergency Department (HOSPITAL_COMMUNITY): Admission: EM | Admit: 2010-04-20 | Discharge: 2010-04-20 | Payer: Self-pay | Admitting: Emergency Medicine

## 2010-04-22 ENCOUNTER — Telehealth: Payer: Self-pay | Admitting: Family Medicine

## 2010-04-23 ENCOUNTER — Encounter: Payer: Self-pay | Admitting: Family Medicine

## 2010-04-23 LAB — CONVERTED CEMR LAB: Pap Smear: NORMAL

## 2010-04-25 ENCOUNTER — Telehealth: Payer: Self-pay | Admitting: *Deleted

## 2010-04-29 ENCOUNTER — Telehealth: Payer: Self-pay | Admitting: Family Medicine

## 2010-05-07 ENCOUNTER — Encounter: Payer: Self-pay | Admitting: Family Medicine

## 2010-05-07 ENCOUNTER — Telehealth: Payer: Self-pay | Admitting: *Deleted

## 2010-05-08 ENCOUNTER — Ambulatory Visit: Payer: Self-pay | Admitting: Family Medicine

## 2010-05-08 LAB — CONVERTED CEMR LAB
Blood in Urine, dipstick: NEGATIVE
Glucose, Urine, Semiquant: NEGATIVE
Ketones, urine, test strip: NEGATIVE
Specific Gravity, Urine: 1.01
pH: 6

## 2010-05-09 ENCOUNTER — Encounter: Payer: Self-pay | Admitting: Family Medicine

## 2010-05-13 ENCOUNTER — Telehealth: Payer: Self-pay | Admitting: Family Medicine

## 2010-05-20 ENCOUNTER — Telehealth: Payer: Self-pay | Admitting: Family Medicine

## 2010-05-29 ENCOUNTER — Telehealth: Payer: Self-pay | Admitting: Family Medicine

## 2010-05-30 ENCOUNTER — Ambulatory Visit: Payer: Self-pay | Admitting: Family Medicine

## 2010-05-30 LAB — CONVERTED CEMR LAB
Blood in Urine, dipstick: NEGATIVE
Glucose, Urine, Semiquant: NEGATIVE
Protein, U semiquant: NEGATIVE
Whiff Test: NEGATIVE

## 2010-06-03 ENCOUNTER — Telehealth: Payer: Self-pay | Admitting: Family Medicine

## 2010-06-26 ENCOUNTER — Telehealth: Payer: Self-pay | Admitting: Family Medicine

## 2010-07-19 ENCOUNTER — Ambulatory Visit: Payer: Self-pay | Admitting: Family Medicine

## 2010-07-19 DIAGNOSIS — M461 Sacroiliitis, not elsewhere classified: Secondary | ICD-10-CM

## 2010-07-19 HISTORY — DX: Sacroiliitis, not elsewhere classified: M46.1

## 2010-08-20 ENCOUNTER — Ambulatory Visit: Payer: Self-pay | Admitting: Family Medicine

## 2010-08-20 ENCOUNTER — Encounter: Payer: Self-pay | Admitting: Family Medicine

## 2010-10-03 ENCOUNTER — Telehealth: Payer: Self-pay | Admitting: Family Medicine

## 2010-10-09 ENCOUNTER — Telehealth (INDEPENDENT_AMBULATORY_CARE_PROVIDER_SITE_OTHER): Payer: Self-pay | Admitting: *Deleted

## 2010-10-29 ENCOUNTER — Encounter: Payer: Self-pay | Admitting: Family Medicine

## 2010-11-25 ENCOUNTER — Encounter: Payer: Self-pay | Admitting: Family Medicine

## 2010-12-05 NOTE — Progress Notes (Signed)
Summary: rx  Phone Note Call from Patient Call back at [phone]   Caller: Patient Summary of Call: needs to call [facility] for her to get diabetic shoes - they already have the size and everything. Initial call taken by: De Nurse,  January 29, 2010 10:26 AM  Follow-up for Phone Call        please call in rx for diabetic shoes Follow-up by: Lequita Asal  MD,  January 29, 2010 6:14 PM    Prescriptions: DIABETIC SHOE Please dispense 1 pair. refill x3  #1 x 3   Entered and Authorized by:   Lequita Asal  MD   Signed by:   Lequita Asal  MD on 01/29/2010   Method used:   Telephoned to ...       [facility] (retail)       [address]       [place], [place]  [address]       Ph: [phone]       Fax: [phone]   RxID:   [id number]  rx called in pt notified.Loralee Pacas CMA  January 30, 2010 9:40 AM

## 2010-12-05 NOTE — Progress Notes (Signed)
Summary: phn msg  Phone Note Call from Patient Call back at Home Phone [phone]   Caller: Patient Summary of Call: wants to know what to about itching all over -   **spoke w/ Dr Sandi Mealy and she suggested that she take Zyrtec or Clairtin OTC Initial call taken by: De Nurse,  April 18, 2010 1:53 PM  Follow-up for Phone Call        informed pt Follow-up by: Golden Circle RN,  April 18, 2010 2:06 PM

## 2010-12-05 NOTE — Progress Notes (Signed)
Summary: Refill  Phone Note Refill Request Call back at Home Phone [phone]   Refills Requested: Medication #1:  TRUETRACK TEST   STRP test blood glucose one time daily pt is testing 3-4 x daily  Initial call taken by: Knox Royalty,  January 14, 2010 2:33 PM  Follow-up for Phone Call        to PCP Follow-up by: Gladstone Pih,  January 14, 2010 3:08 PM  Additional Follow-up for Phone Call Additional follow up Details #1::        I have addressed this numerous times. will send rx for more strips, but will not say it is medically necessary as patient's A1C is basically at goal.  Additional Follow-up by: Lequita Asal  MD,  January 14, 2010 4:13 PM    Additional Follow-up for Phone Call Additional follow up Details #2::    number changed to non published number.   Follow-up by: Gladstone Pih,  January 14, 2010 4:42 PM  Prescriptions: TRUETRACK TEST   STRP (GLUCOSE BLOOD) test blood glucose one time daily  #100 x 4   Entered and Authorized by:   Lequita Asal  MD   Signed by:   Lequita Asal  MD on 01/14/2010   Method used:   Electronically to        [facility]* (retail)       [address]       [place], [place]  [id number]       Ph: [phone]       Fax: [phone]   RxID:   [id number]

## 2010-12-05 NOTE — Progress Notes (Signed)
Summary: triage  Phone Note Call from Patient Call back at Home Phone [phone]   Caller: Patient Summary of Call: Pt wondering if we did a pregnancy test on her when she was last in. Initial call taken by: Clydell Hakim,  April 22, 2010 9:01 AM  Follow-up for Phone Call        told her it was negative. c/o pain above bladder.  Then states her vagina burns. wants rx pain meds. told her to take her ibu every 6 hr with food & I will forward this to md & call her back Follow-up by: Golden Circle RN,  April 22, 2010 9:12 AM  Additional Follow-up for Phone Call Additional follow up Details #1::        did patient keep appt with gynecology? will not do any other medication at this time.  Additional Follow-up by: Lequita Asal  MD,  April 22, 2010 9:28 AM    Additional Follow-up for Phone Call Additional follow up Details #2::    has GYN appt 05/08/10. went to ED & was given pyridium. told her to continue to take it & if no improvement, call for appt. keep taken ibu with food 6 hrs apart. she agreed with plan & was ok with no new pain meds. Follow-up by: Golden Circle RN,  April 22, 2010 9:39 AM

## 2010-12-05 NOTE — Progress Notes (Signed)
  Phone Note From Pharmacy   Summary of Call: Recieved call from [employer] - need diabetes supplies form faxed back.  Dr. Lula Olszewski is on vacation until 10/14/10.  Dr. Fara Boros is covering, but not in clinic tomorrow.  Talked with Dr. Perley Jain- he signed form, faxed back today.  Form completed for testing blood glucose once per day. Initial call taken by: Rochele Pages RN,  October 09, 2010 12:17 PM

## 2010-12-05 NOTE — Miscellaneous (Signed)
Summary: form for diabetic shoes  Clinical Lists Changes patient in for flu vaccine and  presented a form for diabetic shoes that MD needs to fill out. placed in MD box. Theresia Lo RN  August 20, 2010 10:09 AM  Signed paperwork today and placed in office fac pile.  Charna Archer, MD August 26, 2010.

## 2010-12-05 NOTE — Progress Notes (Signed)
Summary: Rx Req  Phone Note Call from Patient Call back at Home Phone [phone]   Caller: Patient Summary of Call: Pt has sores in her nose and wondering if something can be called in for her for them.   Pharmacy is [facility] Initial call taken by: Clydell Hakim,  February 26, 2010 3:05 PM  Follow-up for Phone Call        sores from what? needs eval Follow-up by: Lequita Asal  MD,  February 26, 2010 3:07 PM  Additional Follow-up for Phone Call Additional follow up Details #1::        Pt notified to sched apt Additional Follow-up by: Gladstone Pih,  February 26, 2010 3:42 PM

## 2010-12-05 NOTE — Progress Notes (Signed)
Summary: triage  Phone Note Call from Patient Call back at Home Phone [phone]   Caller: Patient Summary of Call: Pt has sores in her nose and wondering if she needs to be seen or can something be called in for her. Initial call taken by: Clydell Hakim,  Mar 07, 2010 11:30 AM  Follow-up for Phone Call        sores started months ago. has been seen by md for this & has had antibiotics. she is currently using vicks salve. wants to know if md will call meds in or if she needs to be seen again. states she cannot get here until monday or tuesday. told pt I will call her back Follow-up by: Golden Circle RN,  Mar 07, 2010 11:31 AM  Additional Follow-up for Phone Call Additional follow up Details #1::        ive not seen patient for this, and will not call in antibiotics. patient cancelled an appt that was scheduled for this problem earlier this week because "she was feeling better. "    Additional Follow-up for Phone Call Additional follow up Details #2::    appt Tues with pcp 1:30 Follow-up by: Golden Circle RN,  Mar 08, 2010 8:38 AM

## 2010-12-05 NOTE — Consult Note (Signed)
Summary: [facility]  [facility]   Imported By: Clydell Hakim 01/03/2010 15:55:25  _____________________________________________________________________  External Attachment:    Type:   Image     Comment:   External Document

## 2010-12-05 NOTE — Consult Note (Signed)
Summary: [facility]   Imported By: Knox Royalty 05/25/2010 12:34:11  _____________________________________________________________________  External Attachment:    Type:   Image     Comment:   External Document

## 2010-12-05 NOTE — Progress Notes (Signed)
Summary: refill  Phone Note Refill Request Call back at Home Phone [phone] Message from:  Patient  fluconazole 150mg  has a rash under her breast and wants this called into [facility]  Initial call taken by: De Nurse,  April 12, 2010 8:53 AM  Follow-up for Phone Call        pt notified Follow-up by: Gladstone Pih,  April 12, 2010 12:12 PM    New/Updated Medications: DIFLUCAN 150 MG TABS (FLUCONAZOLE) one tab by mouth daily x 3 days Prescriptions: DIFLUCAN 150 MG TABS (FLUCONAZOLE) one tab by mouth daily x 3 days  #3 x 0   Entered and Authorized by:   Lequita Asal  MD   Signed by:   Lequita Asal  MD on 04/12/2010   Method used:   Electronically to        [facility]* (retail)       [address]       [place], [place]  [address]       Ph: [phone]       Fax: [phone]   RxID:   [id number]

## 2010-12-05 NOTE — Consult Note (Signed)
Summary: [facility]  [facility]   Imported By: De Nurse 05/14/2010 15:00:25  _____________________________________________________________________  External Attachment:    Type:   Image     Comment:   External Document

## 2010-12-05 NOTE — Progress Notes (Signed)
Summary: Rx Prob  Phone Note Call from Patient Call back at Home Phone [phone]   Caller: Patient Summary of Call: Pt wondering why her choleserol medication was denied per pharmacy. Initial call taken by: Clydell Hakim,  June 26, 2010 8:59 AM  Follow-up for Phone Call        left message for patient to call back. I do not see a request for cholesterol med or that it has been denied. will need to talk with patient about this. Follow-up by: Theresia Lo RN,  June 26, 2010 9:41 AM  Additional Follow-up for Phone Call Additional follow up Details #1::        pt returned call Additional Follow-up by: De Nurse,  June 26, 2010 11:57 AM    Additional Follow-up for Phone Call Additional follow up Details #2::    patient states that [facility] told her it was denied. will forward message to MD. Follow-up by: Theresia Lo RN,  June 26, 2010 2:14 PM  Prescriptions: NIASPAN 750 MG  TBCR (NIACIN (ANTIHYPERLIPIDEMIC)) take 2 by mouth qday  #180 x 1   Entered and Authorized by:   Zachery Dauer MD   Signed by:   Zachery Dauer MD on 06/26/2010   Method used:   Electronically to        [facility]* (retail)       [address]       [place], [place]  [address]       Ph: [phone]       Fax: [phone]   RxID:   [id number]

## 2010-12-05 NOTE — Progress Notes (Signed)
Summary: RX Req  Phone Note Refill Request Call back at Home Phone [phone] Message from:  Patient  Refills Requested: Medication #1:  NIASPAN 750 MG  TBCR take 2 by mouth qday PT USES [facility].  Initial call taken by: Clydell Hakim,  Mar 19, 2010 3:10 PM  Follow-up for Phone Call        Covering Dr. Weyman Croon box while she is out.  Refill sent. Follow-up by: Romero Belling MD,  Mar 19, 2010 9:23 PM    Prescriptions: NIASPAN 750 MG  TBCR (NIACIN (ANTIHYPERLIPIDEMIC)) take 2 by mouth qday  #180 x 0   Entered and Authorized by:   Romero Belling MD   Signed by:   Romero Belling MD on 03/19/2010   Method used:   Electronically to        [facility] (retail)       [address]       [place], [place]  [address]       Ph: [phone]       Fax: [phone]   RxID:   [id number]

## 2010-12-05 NOTE — Miscellaneous (Signed)
Summary: dm supplies  [facility] is requesting md signature for DM testing supplies for the next year. to pcp.Golden Circle RN  Mar 05, 2010 3:53 PM  will not complete, as PCP will be changing in next few weeks.  Lequita Asal  MD  Mar 05, 2010 4:41 PM  Appended Document: dm supplies upon eval, decided to complete form. completed for once daily testing.

## 2010-12-05 NOTE — Progress Notes (Signed)
Summary: refill  Phone Note Refill Request Call back at Home Phone [phone] Message from:  Patient  Refills Requested: Medication #1:  IBUPROFEN 800 MG  TABS 1 by mouth three times a day with food Initial call taken by: De Nurse,  October 03, 2010 1:57 PM  Follow-up for Phone Call        will forward to MD. Follow-up by: Theresia Lo RN,  October 03, 2010 2:27 PM  Additional Follow-up for Phone Call Additional follow up Details #1::        paper refill already completed and sent to pharmacy.  Pt. only needs one refill.  Additional Follow-up by: Ardyth Gal MD,  October 03, 2010 5:59 PM

## 2010-12-05 NOTE — Assessment & Plan Note (Signed)
Summary: female problem,tcb   Vital Signs:  Patient profile:   55 year old female Weight:      201.5 pounds Temp:     98.5 degrees F oral Pulse rate:   84 / minute BP sitting:   115 / 79  (right arm)  Vitals Entered By: Renato Battles slade,cma CC: pt states 'I have no problems really just bursitis in my right hip. i take ibuprofen, but it does not help.  have vag dryness' not sexually active since 1990 denies vag d/c Is Patient Diabetic? Yes Pain Assessment Patient in pain? no        Primary Care Ariela Mochizuki:  Ardyth Gal MD  CC:  pt states 'I have no problems really just bursitis in my right hip. i take ibuprofen and but it does not help.  have vag dryness' not sexually active since 1990 denies vag d/c.  History of Present Illness: Bladder dropped, and vaginal dryness.  Patient is not interested in a pessary or surgery.  She does not want to use topical estrogens.  She just wanted to talk about it since her Mother was here for an apt.  "bursitis" is really bad today, has been using a lot of ibuprofen ( she is a diabetic).    Habits & Providers  Alcohol-Tobacco-Diet     Tobacco Status: current     Tobacco Counseling: to quit use of tobacco products  Comments: only smokes sometimes  Current Medications (verified): 1)  Benztropine Mesylate 1 Mg  Tabs (Benztropine Mesylate) .Marland Kitchen.. 1 By Mouth At Bedtime 2)  Aspirin Ec 81 Mg Tbec (Aspirin) .... Take 1 Tablet By Mouth Every Morning 3)  Prilosec 20 Mg Cpdr (Omeprazole) .... Take 1 Capsule By Mouth Once A Day 4)  Lopid 600 Mg  Tabs (Gemfibrozil) .Marland Kitchen.. 1 By Mouth Bid 5)  Oxybutynin Chloride 5 Mg Tabs (Oxybutynin Chloride) .Marland Kitchen.. 1 Tablet By Mouth Twice A Day 6)  Lisinopril-Hydrochlorothiazide 20-25 Mg Tabs (Lisinopril-Hydrochlorothiazide) .... One Tab By Mouth Daily 7)  Ibuprofen 800 Mg  Tabs (Ibuprofen) .Marland Kitchen.. 1 By Mouth Three Times A Day With Food 8)  Niaspan 750 Mg  Tbcr (Niacin (Antihyperlipidemic)) .... Take 2 By Mouth Qday 9)   Unilet Excelite Ii   Misc (Lancets) .... Use 3-4 X Daily 10)  Truetrack Test   Strp (Glucose Blood) .... Test Blood Glucose One Time Daily 11)  Metformin Hcl 500 Mg Tabs (Metformin Hcl) .... 2 Tabs By Mouth Qam and 1 Tab By Mouth Qhs 12)  Voltaren 1 %  Gel (Diclofenac Sodium) .... Apply 4 G Two Times A Day  To Qid As Needed . Dispense 100 Gm Tube. 13)  Knee Brace .... 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 Bilateral Braces To Fit Pt  Dx: 715.96 15)  Diabetic Shoe .... Please Dispense 1 Pair. Refill X3 16)  Diabetic Shoe Inserts .... Please Use As Directed. Dispense 3 Pair. 17)  Fluphenazine Decanoate 25 Mg/ml Soln (Fluphenazine Decanoate) 18)  Flonase 50 Mcg/act Susp (Fluticasone Propionate) .... One Inhalation in Each Nostril Once A Day. 19)  Tramadol Hcl 50 Mg Tabs (Tramadol Hcl) .Marland Kitchen.. 1-2 Tabs Two Times A Day As Needed For Pain  Allergies: 1)  ! Codeine  Physical Exam  General:  Alert, mental illness obvious. Msk:  Pain concentrated in right SI joint area, increase with stressing the joint   Impression & Recommendations:  Problem # 1:  VAGINAL PRURITUS (ICD-698.1) discussed using replens OTC  product Orders: [facility]- Est Level  3 (08657)  Problem # 2:  SACROILIITIS, RIGHT (ICD-720.2) add low dose tramadol, instructed patient not to overuse ibuprofen. Orders: [facility]- Est Level  3 (84696)  Complete Medication List: 1)  Benztropine Mesylate 1 Mg Tabs (Benztropine mesylate) .Marland Kitchen.. 1 by mouth at bedtime 2)  Aspirin Ec 81 Mg Tbec (Aspirin) .... Take 1 tablet by mouth every morning 3)  Prilosec 20 Mg Cpdr (Omeprazole) .... Take 1 capsule by mouth once a day 4)  Lopid 600 Mg Tabs (Gemfibrozil) .Marland Kitchen.. 1 by mouth bid 5)  Oxybutynin Chloride 5 Mg Tabs (Oxybutynin chloride) .Marland Kitchen.. 1 tablet by mouth twice a day 6)  Lisinopril-hydrochlorothiazide 20-25 Mg Tabs (Lisinopril-hydrochlorothiazide) .... One tab by mouth daily 7)  Ibuprofen 800 Mg Tabs (Ibuprofen) .Marland Kitchen..  1 by mouth three times a day with food 8)  Niaspan 750 Mg Tbcr (Niacin (antihyperlipidemic)) .... Take 2 by mouth qday 9)  Unilet Excelite Ii Misc (Lancets) .... Use 3-4 x daily 10)  Truetrack Test Strp (Glucose blood) .... Test blood glucose one time daily 11)  Metformin Hcl 500 Mg Tabs (Metformin hcl) .... 2 tabs by mouth qam and 1 tab by mouth qhs 12)  Voltaren 1 % Gel (Diclofenac sodium) .... Apply 4 g two times a day  to qid as needed . dispense 100 gm tube. 13)  Knee Brace  .... Brace to fit pt dx: djd b knees 14)  Knee Brace - Stabilizing With Hinges  .... Bilateral braces to fit pt  dx: 715.96 15)  Diabetic Shoe  .... Please dispense 1 pair. refill x3 16)  Diabetic Shoe Inserts  .... Please use as directed. dispense 3 pair. 17)  Fluphenazine Decanoate 25 Mg/ml Soln (Fluphenazine decanoate) 18)  Flonase 50 Mcg/act Susp (Fluticasone propionate) .... One inhalation in each nostril once a day. 19)  Tramadol Hcl 50 Mg Tabs (Tramadol hcl) .Marland Kitchen.. 1-2 tabs two times a day as needed for pain  Patient Instructions: 1)  Please make apt with her primary MD for diabetic check 2)  Replens gel-can purchase OTC for the dryness 3)  Use tramadol to help with back pain 4)  Try not to take ibuprofen too much Prescriptions: TRAMADOL HCL 50 MG TABS (TRAMADOL HCL) 1-2 tabs two times a day as needed for pain  #30 x 1   Entered and Authorized by:   Luretha Murphy NP   Signed by:   Luretha Murphy NP on 07/19/2010   Method used:   Printed then faxed to ...       [facility] (retail)       [address]       [place], [place]  [id number]       Ph: [phone]       Fax: [phone]   RxID:   [id number]

## 2010-12-05 NOTE — Letter (Signed)
Summary: error  [facility]  [address]   [place], [place] [address]   Phone: [phone]  Fax: [phone]    [facility]  [address]   [place], [place] [address]   Phone: [phone]  Fax: [phone]    February 20, 2010   Patient:  Krista [name]    To Whom It May Concern:   I certify that all of the following statements are true:  Talbert Forest Ward (DOB 2056/03/16) -has Diabetes Mellitus -has poor circulation -is a patient of mine who I am treating under a comprehensive plan of care for her diabetes -requires special shoes because of her diabetes   Sincerely,    Lequita Asal  MD    ****This is a legal document and cannot be tampered with.  Schools are authorized to verify all information and to do so accordingly.    Signed by Lequita Asal  MD on 02/20/2010 at 10:45 AM  ________________________________________________________________________

## 2010-12-05 NOTE — Progress Notes (Signed)
Summary: triage  Phone Note Call from Patient Call back at Home Phone [phone]   Caller: Patient Summary of Call: pt states she has a sinus inf - wants Flonaise called in to [facility] Initial call taken by: De Nurse,  April 03, 2010 8:33 AM  Follow-up for Phone Call        went to [facility] ED. had facial numbness. htn was high. was told she could use asa or flonase. Told her she needs to come in. states she cannot as she just went to ED yesterday. told her I will ask md & she is going to call me back after she gets done with her errands. Follow-up by: Golden Circle RN,  April 03, 2010 8:44 AM  Additional Follow-up for Phone Call Additional follow up Details #1::        flonase takes  up to a month to help with symptoms and it is not treatment for sinusitis. however, will send rx.  Additional Follow-up by: Lequita Asal  MD,  April 03, 2010 9:49 AM    Additional Follow-up for Phone Call Additional follow up Details #2::    states she has already gotten the flonase. states she had forgotten to take her bp pill yesterday. discussed strategies to prevent that. has appt on 6th per pt Follow-up by: Golden Circle RN,  April 03, 2010 3:14 PM  New/Updated Medications: FLONASE 50 MCG/ACT SUSP (FLUTICASONE PROPIONATE) one inhalation in each nostril once a day. Prescriptions: FLONASE 50 MCG/ACT SUSP (FLUTICASONE PROPIONATE) one inhalation in each nostril once a day.  #1 x 1   Entered and Authorized by:   Lequita Asal  MD   Signed by:   Lequita Asal  MD on 04/03/2010   Method used:   Electronically to        [facility]* (retail)       [address]       [place], [place]  [id number]       Ph: [phone]       Fax: [phone]   RxID:   [id number]

## 2010-12-05 NOTE — Assessment & Plan Note (Signed)
Summary: female prob,tcb   Vital Signs:  Patient profile:   55 year old female Height:      64.5 inches Weight:      205 pounds BMI:     34.77 Temp:     98.1 degrees F Pulse rate:   96 / minute BP sitting:   122 / 83  (left arm) Cuff size:   regular  Vitals Entered By: Dennison Nancy RN (May 30, 2010 10:13 AM) CC: vaginal itching Is Patient Diabetic? Yes Pain Assessment Patient in pain? yes     Location: right knee Intensity: 2   Primary Care Provider:  Ardyth Gal MD  CC:  vaginal itching.  History of Present Illness: 1) Vaginal itching: Recently treated for UTI 05/08/10 w/ Keflex. Dysuria has resolved, but she developed vaginal itching and thick yellowish / whitish discharge. Has been using miconazole cream intravaginally with some relief. Has history of untreated bladder prolapse.  ROS: as above and - Denies fever, chills, abnormal vaginal bleeding, nausea, emesis, diarrhea, CVA tenderness, dysuria.  Med rec as below and miconazole cream daily   Habits & Providers  Alcohol-Tobacco-Diet     Tobacco Status: current     Tobacco Counseling: to quit use of tobacco products     Cigarette Packs/Day: 1.0  Current Medications (verified): 1)  Benztropine Mesylate 1 Mg  Tabs (Benztropine Mesylate) .Marland Kitchen.. 1 By Mouth At Bedtime 2)  Aspirin Ec 81 Mg Tbec (Aspirin) .... Take 1 Tablet By Mouth Every Morning 3)  Prilosec 20 Mg Cpdr (Omeprazole) .... Take 1 Capsule By Mouth Once A Day 4)  Lopid 600 Mg  Tabs (Gemfibrozil) .Marland Kitchen.. 1 By Mouth Bid 5)  Oxybutynin Chloride 5 Mg Tabs (Oxybutynin Chloride) .Marland Kitchen.. 1 Tablet By Mouth Twice A Day 6)  Lisinopril-Hydrochlorothiazide 20-25 Mg Tabs (Lisinopril-Hydrochlorothiazide) .... One Tab By Mouth Daily 7)  Ibuprofen 800 Mg  Tabs (Ibuprofen) .Marland Kitchen.. 1 By Mouth Three Times A Day With Food 8)  Niaspan 750 Mg  Tbcr (Niacin (Antihyperlipidemic)) .... Take 2 By Mouth Qday 9)  Unilet Excelite Ii   Misc (Lancets) .... Use 3-4 X Daily 10)  Truetrack  Test   Strp (Glucose Blood) .... Test Blood Glucose One Time Daily 11)  Metformin Hcl 500 Mg Tabs (Metformin Hcl) .... 2 Tabs By Mouth Qam and 1 Tab By Mouth Qhs 12)  Voltaren 1 %  Gel (Diclofenac Sodium) .... Apply 4 G Two Times A Day  To Qid As Needed . Dispense 100 Gm Tube. 13)  Knee Brace .... 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 Bilateral Braces To Fit Pt  Dx: 715.96 15)  Diabetic Shoe .... Please Dispense 1 Pair. Refill X3 16)  Diabetic Shoe Inserts .... Please Use As Directed. Dispense 3 Pair. 17)  Fluphenazine Decanoate 25 Mg/ml Soln (Fluphenazine Decanoate) 18)  Flonase 50 Mcg/act Susp (Fluticasone Propionate) .... One Inhalation in Each Nostril Once A Day.  Allergies (verified): 1)  ! Codeine  Physical Exam  General:  obese, pleasant but mildly agitated, NAD   Genitalia:  normal introitus with large palpable mass protruding from vagina. on bimanual exam, cervix and uterus palpated, and mass able to be reduced. cystocele.  cervix non tender.  able to visualize edge of cervix with large speculum.  no unusual discharge.   Wet prep obtained. No external lesions noted other than some mild erythema of the skin.  no breakdown of the skin or signs of trauma  Impression & Recommendations:  Problem # 1:  VAGINAL PRURITUS (ICD-698.1) Assessment New UA negative, wet prep negative, no cervical or vaginal discharge noted on exam. No cervical motion tenderness or pelvic pain. "Discharge likley miconazole cream. Pruritus likely related to cystocele. Unwilling to see GYN. Will defer to PCP. Follow up in one month.  Orders: Wet Prep- [facility] ([address]) [facility]- Est Level  3 (60454)  Complete Medication List: 1)  Benztropine Mesylate 1 Mg Tabs (Benztropine mesylate) .Marland Kitchen.. 1 by mouth at bedtime 2)  Aspirin Ec 81 Mg Tbec (Aspirin) .... Take 1 tablet by mouth every morning 3)  Prilosec 20 Mg Cpdr (Omeprazole) .... Take 1 capsule by mouth once a day 4)  Lopid 600 Mg  Tabs (Gemfibrozil) .Marland Kitchen.. 1 by mouth bid 5)  Oxybutynin Chloride 5 Mg Tabs (Oxybutynin chloride) .Marland Kitchen.. 1 tablet by mouth twice a day 6)  Lisinopril-hydrochlorothiazide 20-25 Mg Tabs (Lisinopril-hydrochlorothiazide) .... One tab by mouth daily 7)  Ibuprofen 800 Mg Tabs (Ibuprofen) .Marland Kitchen.. 1 by mouth three times a day with food 8)  Niaspan 750 Mg Tbcr (Niacin (antihyperlipidemic)) .... Take 2 by mouth qday 9)  Unilet Excelite Ii Misc (Lancets) .... Use 3-4 x daily 10)  Truetrack Test Strp (Glucose blood) .... Test blood glucose one time daily 11)  Metformin Hcl 500 Mg Tabs (Metformin hcl) .... 2 tabs by mouth qam and 1 tab by mouth qhs 12)  Voltaren 1 % Gel (Diclofenac sodium) .... Apply 4 g two times a day  to qid as needed . dispense 100 gm tube. 13)  Knee Brace  .... Brace to fit pt dx: djd b knees 14)  Knee Brace - Stabilizing With Hinges  .... Bilateral braces to fit pt  dx: 715.96 15)  Diabetic Shoe  .... Please dispense 1 pair. refill x3 16)  Diabetic Shoe Inserts  .... Please use as directed. dispense 3 pair. 17)  Fluphenazine Decanoate 25 Mg/ml Soln (Fluphenazine decanoate) 18)  Flonase 50 Mcg/act Susp (Fluticasone propionate) .... One inhalation in each nostril once a day.  Other Orders: Urinalysis-[facility] (00000)  Laboratory Results   Urine Tests  Date/Time Received: May 30, 2010 10:14 AM  Date/Time Reported: May 30, 2010 10:32 AM   Routine Urinalysis   Color: yellow Appearance: Clear Glucose: negative   (Normal Range: Negative) Bilirubin: negative   (Normal Range: Negative) Ketone: negative   (Normal Range: Negative) Spec. Gravity: 1.010   (Normal Range: 1.003-1.035) Blood: negative   (Normal Range: Negative) pH: 6.0   (Normal Range: 5.0-8.0) Protein: negative   (Normal Range: Negative) Urobilinogen: 0.2   (Normal Range: 0-1) Nitrite: negative   (Normal Range: Negative) Leukocyte Esterace: moderate   (Normal Range: Negative)  Urine Microscopic WBC/HPF: 1-5 RBC/HPF:  rare Bacteria/HPF: 3+ Epithelial/HPF: 10-20 Crystals/HPF: occ talc    Comments: ...........test performed by...........Marland KitchenTerese Door, CMA  Date/Time Received: May 30, 2010 11:02 AM  Date/Time Reported: May 30, 2010 11:14 AM   [employer] Source: vaginal WBC/hpf: 1-5 Bacteria/hpf: 2+  Rods Clue cells/hpf: none  Negative whiff Yeast/hpf: none Trichomonas/hpf: none Comments: ...........test performed by...........Marland KitchenTerese Door, CMA

## 2010-12-05 NOTE — Progress Notes (Signed)
Summary: Rx Req  Phone Note Call from Patient Call back at Home Phone [phone]   Caller: Patient Summary of Call: Pt has new pharmacy [facility] [phone].  Wants her test strips sent there.  She test's twice a day. Initial call taken by: Clydell Hakim,  April 25, 2010 9:12 AM  Follow-up for Phone Call        to Dr. Sandi Mealy for OK Follow-up by: Golden Circle RN,  April 25, 2010 9:16 AM  Additional Follow-up for Phone Call Additional follow up Details #1::        to former PCP for okay.  Additional Follow-up by: Ancil Boozer  MD,  April 25, 2010 9:41 AM    Additional Follow-up for Phone Call Additional follow up Details #2::    patient has been informed REPEATEDLY that she doesn't need to test twice a day, and I cannot prescribe it as such because that would be fraudulent. will send new rx for ONCE daily testing to new place. Follow-up by: Lequita Asal  MD,  April 25, 2010 9:45 AM  Additional Follow-up for Phone Call Additional follow up Details #3:: Details for Additional Follow-up Action Taken: called pt to notify of above note by Dr Lanier Prude, pt stated that she did not consider Dr Lanier Prude her Dr any more that Dr Ardyth Gal was her dr and that she would not talk to me any more and Hung up.  I was not able to explain need to only test once a day.Marland KitchenMarland KitchenMarland KitchenTo [facility] Additional Follow-up by: Gladstone Pih,  April 25, 2010 2:22 PM  Prescriptions: TRUETRACK TEST   STRP (GLUCOSE BLOOD) test blood glucose one time daily  #31 x 3   Entered and Authorized by:   Lequita Asal  MD   Signed by:   Lequita Asal  MD on 04/25/2010   Method used:   Faxed to ...       [facility] (mail-order)       [address]       [place], [place]  [address]       Ph: [phone]       Fax: [phone]   RxID:   [id number]

## 2010-12-05 NOTE — Assessment & Plan Note (Signed)
Summary: flu shot/bmc  Nurse Visit   Vital Signs:  Patient profile:   55 year old female Temp:     98.4 degrees F  Vitals Entered By: Theresia Lo RN (August 20, 2010 10:06 AM)  Allergies: 1)  ! Codeine  Immunizations Administered:  Influenza Vaccine # 1:    Vaccine Type: Fluvax MCR    Site: right deltoid    Mfr: GlaxoSmithKline    Dose: 0.5 ml    Route: IM    Given by: Theresia Lo RN    Exp. Date: 04/30/2011    Lot #: [id number]    VIS given: 05/27/07 version given August 20, 2010.  Flu Vaccine Consent Questions:    Do you have a history of severe allergic reactions to this vaccine? no    Any prior history of allergic reactions to egg and/or gelatin? no    Do you have a sensitivity to the preservative Thimersol? no    Do you have a past history of Guillan-Barre Syndrome? no    Do you currently have an acute febrile illness? no    Have you ever had a severe reaction to latex? no    Vaccine information given and explained to patient? yes    Are you currently pregnant? no  Orders Added: 1)  Influenza Vaccine MCR [00025] 2)  Administration Flu vaccine - MCR [G0008]

## 2010-12-05 NOTE — Letter (Signed)
Summary: Generic Letter  [facility]  [address]   [place], [place] [address]   Phone: [phone]  Fax: [phone]    04/23/2010  Krista [name] [address] [place], [place]  [address]  Dear Ms. Mayford Knife,  Your recent pap test was normal.  Your next pap test is due in 1 year.     Sincerely,   Ancil Boozer  MD  Appended Document: Generic Letter mailed

## 2010-12-05 NOTE — Progress Notes (Signed)
Summary: Rx Req  Phone Note Refill Request Call back at Home Phone [phone] Call back at [phone] Message from:  Patient  Refills Requested: Medication #1:  TRUETRACK TEST   STRP test blood glucose one time daily [facility]    Initial call taken by: Clydell Hakim,  December 25, 2009 3:26 PM  Follow-up for Phone Call        Pt notified.  Wanted you to know pharm will not fill  Niaspan.  Can only get it through mail order and is out.  Advised we do not have samples to give her till script is mailed to her,advised to go ahead and mail order Rx,  voiced understanding Follow-up by: Gladstone Pih,  December 26, 2009 9:12 AM    Prescriptions: Gilman Schmidt TEST   STRP (GLUCOSE BLOOD) test blood glucose one time daily  #30 x 11   Entered and Authorized by:   Lequita Asal  MD   Signed by:   Lequita Asal  MD on 12/25/2009   Method used:   Electronically to        [facility]* (retail)       [address]       [place], [place]  [id number]       Ph: [phone]       Fax: [phone]   RxID:   [id number]
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
## 2010-12-05 NOTE — Progress Notes (Signed)
Summary: Triage  Phone Note Call from Patient Call back at Home Phone [phone]   Reason for Call: Talk to Nurse Summary of Call: pt says the flonase is not helping, wants something else Initial call taken by: Knox Royalty,  April 15, 2010 10:49 AM  Follow-up for Phone Call        uses [facility]. she wants something else. told her i will send it to pcp Follow-up by: Golden Circle RN,  April 15, 2010 10:54 AM  Additional Follow-up for Phone Call Additional follow up Details #1::        it takes up to a month to have full benefit. just saw her last week, and she didnt complain of sinus trouble. doesnt need antibiotics at this point Additional Follow-up by: Lequita Asal  MD,  April 15, 2010 10:56 AM    Additional Follow-up for Phone Call Additional follow up Details #2::    no answer Follow-up by: Golden Circle RN,  April 15, 2010 1:48 PM  Additional Follow-up for Phone Call Additional follow up Details #3:: Details for Additional Follow-up Action Taken: no answer Additional Follow-up by: Golden Circle RN,  April 15, 2010 3:11 PM  spoke with pt & gave her the md response. she agreed to keep using for at least a month.Golden Circle RN  April 16, 2010 9:03 AM

## 2010-12-05 NOTE — Progress Notes (Signed)
Summary: Rx Req  Phone Note Call from Patient Call back at Home Phone [phone]   Caller: Patient Summary of Call: Pt wondering if she can get an rx for phenazopyridine HCL 200mg  tabs for bladder infection not sure even if she should still be taking it.  Initial call taken by: Clydell Hakim,  April 29, 2010 8:53 AM  Follow-up for Phone Call        patient states she was given pyridium at ER visit. now she is out. burning with urination has stopped . now she has pain low mid back. has a follow up with MD on 05/08/2010 . advised will ask MD about refill. suggested to patient she problably will need office visit but she states MD already knows about her problem.  pharmacy , [facility]. will send message to Dr. Sandi Mealy since she last saw her. Follow-up by: Theresia Lo RN,  April 29, 2010 9:33 AM  Additional Follow-up for Phone Call Additional follow up Details #1::        no refill at this time.  keep aptt for 7/6.  if develops fever, chills, worsening pain or burning returns she needs to call to be seen sooner. Additional Follow-up by: Ancil Boozer  MD,  April 29, 2010 10:27 AM    Additional Follow-up for Phone Call Additional follow up Details #2::    patient notified. Follow-up by: Theresia Lo RN,  April 29, 2010 10:48 AM

## 2010-12-05 NOTE — Progress Notes (Signed)
Summary: phn msg  Phone Note Call from Patient   Caller: Patient Summary of Call: Pt said she was all better and did not need to be seen today. Initial call taken by: Clydell Hakim,  Mar 04, 2010 8:41 AM  Follow-up for Phone Call        to pcp Follow-up by: Gladstone Pih,  Mar 04, 2010 10:23 AM

## 2010-12-05 NOTE — Progress Notes (Signed)
Summary: results  Phone Note Call from Patient Call back at Home Phone [phone]   Caller: Patient Summary of Call: pt is wanting results of test done the other day Initial call taken by: De Nurse,  June 03, 2010 12:23 PM  Follow-up for Phone Call        told her notes indicated no problems. she had no further questions Follow-up by: Golden Circle RN,  June 03, 2010 12:29 PM

## 2010-12-05 NOTE — Miscellaneous (Signed)
  Clinical Lists Changes  Medications: Removed medication of DARVOCET-N 50 50-325 MG TABS (PROPOXYPHENE N-APAP) one to two tabs by mouth q4 hours as needed pain. Removed medication of KEFLEX 500 MG CAPS (CEPHALEXIN) one three times a day for 7 days

## 2010-12-05 NOTE — Progress Notes (Signed)
Summary: refill  Phone Note Call from Patient Call back at Home Phone [phone]   Caller: Patient Summary of Call: would like refill on her Ibuprofen cream Roxine Caddy Initial call taken by: De Nurse,  Mar 13, 2010 12:21 PM  Follow-up for Phone Call        sent Follow-up by: Lequita Asal  MD,  Mar 13, 2010 12:22 PM  Additional Follow-up for Phone Call Additional follow up Details #1::        informed pt Additional Follow-up by: Loralee Pacas CMA,  Mar 13, 2010 1:28 PM    Prescriptions: VOLTAREN 1 %  GEL (DICLOFENAC SODIUM) apply 4 g two times a day  to qid as needed . dispense 100 gm tube.  #1 x 2   Entered and Authorized by:   Lequita Asal  MD   Signed by:   Lequita Asal  MD on 03/13/2010   Method used:   Electronically to        [facility]* (retail)       [address]       [place], [place]  [id number]       Ph: [phone]       Fax: [phone]   RxID:   [id number]

## 2010-12-05 NOTE — Progress Notes (Signed)
Summary: Rx Req  Phone Note Refill Request Call back at Home Phone [phone] Message from:  Patient  Refills Requested: Medication #1:  TRUETRACK TEST   STRP test blood glucose one time daily PT IS USING THEM TWICE A DAY.  [facility].  Initial call taken by: Clydell Hakim,  Mar 04, 2010 9:21 AM  Follow-up for Phone Call        as I've addressed repeatedly with patient and [facility] I cannot prescribe them for any higher frequency than once daily. Follow-up by: Lequita Asal  MD,  Mar 04, 2010 10:28 AM  Additional Follow-up for Phone Call Additional follow up Details #1::        patient notified. Additional Follow-up by: Theresia Lo RN,  Mar 04, 2010 11:13 AM    Prescriptions: Gilman Schmidt TEST   STRP (GLUCOSE BLOOD) test blood glucose one time daily  #31 x 3   Entered and Authorized by:   Lequita Asal  MD   Signed by:   Lequita Asal  MD on 03/04/2010   Method used:   Electronically to        [facility]* (retail)       [address]       [place], [place]  [id number]       Ph: [phone]       Fax: [phone]   RxID:   [id number]
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
## 2010-12-05 NOTE — Progress Notes (Signed)
Summary: Rx  Phone Note Refill Request Call back at Home Phone [phone]   pt needs refill on diflucan, says its for a rash she gets under breast every summer  Initial call taken by: Knox Royalty,  May 13, 2010 2:53 PM  Follow-up for Phone Call        Pt does seem to have been on diflucan before.  Will forward to MD Follow-up by: Jone Baseman CMA,  May 13, 2010 4:10 PM  Additional Follow-up for Phone Call Additional follow up Details #1::        pt calling back, wants to know what she can use until MD refills med. Additional Follow-up by: Knox Royalty,  May 16, 2010 9:12 AM    Additional Follow-up for Phone Call Additional follow up Details #2::    LM Follow-up by: Golden Circle RN,  May 16, 2010 9:16 AM  Additional Follow-up for Phone Call Additional follow up Details #3:: Details for Additional Follow-up Action Taken: she uses [facility]. wants this med NOW. told her I will ask another md about this Additional Follow-up by: Golden Circle RN,  May 16, 2010 1:56 PM   per Dr. Sheffield Slider, she can use OTC clotrimazole. pt states she has some at home & will use it. told her if not better by Monday, she will need to be seen.Golden Circle RN  May 16, 2010 2:02 PM  pt states she couldn't come this morning b/c she had to get some food from [employer].  wants to make appt for next week, told her that it would be best to call the morning that she knew she could come and she doesn't want to do that.  she wants to make appt now. De Nurse  May 17, 2010 11:09 AM  states she has blood in her urine. still on meds for UTI. states her back is hurting. tried to make her an appt today. states she has an appt at 1:30 & has no ride to come here. told her she must be seen today & urged her to go to UC when she is done with her appt & can find a ride.  states she wants an appt monday. made one for work in at 10:30. told her the significance of missing appts without calling 24 hrs in  advance. told her the one she did not come to today will be a negative. asked her to make sure she had a rid before she makes appts. again asked her to go to UC today for this c/o bloody urine & back pain.Golden Circle RN  May 17, 2010 11:17 AM

## 2010-12-05 NOTE — Assessment & Plan Note (Signed)
Summary: PATIENT SUMMARY  Patient almost always comes at same time as her mother Leodis Binet. Refuse most preventative care.    Vital Signs:  Patient profile:   55 year old female Height:      64.5 inches Weight:      212.38 pounds BMI:     36.02 Temp:     98.4 degrees F oral Pulse rate:   93 / minute BP sitting:   117 / 81  (left arm)  Vitals Entered By: Terese Door (April 08, 2010 1:41 PM) CC: DM check, female problem Is Patient Diabetic? Yes Pain Assessment Patient in pain? yes     Location: face Intensity: 6 Type: aching   Primary Care Provider:  Lequita Asal  MD  CC:  DM check and female problem.  History of Present Illness: DM- CBGs 100s-200s. denies polyuria and polydipsia. denies hypo or hyperglycemic episodes. on metformin 1000 mg in morning, 500 mg in evening. diabetic retinopathy noted on most recent eye eval.  HTN- on lisinopril-hctz. denies chest pain, SOB, N/V, peripheral edema, headaches.   female problem- notices bulge when going to urinate for at least a month. occasional difficulty urinating. not sexually active >10 years. h/o "bandaid surgery." only one pregnancy.  not painful.  Habits & Providers  Alcohol-Tobacco-Diet     Tobacco Status: current     Tobacco Counseling: to quit use of tobacco products     Cigarette Packs/Day: 1.0  Current Medications (verified): 1)  Benztropine Mesylate 1 Mg  Tabs (Benztropine Mesylate) .Marland Kitchen.. 1 By Mouth At Bedtime 2)  Aspirin Ec 81 Mg Tbec (Aspirin) .... Take 1 Tablet By Mouth Every Morning 3)  Prilosec 20 Mg Cpdr (Omeprazole) .... Take 1 Capsule By Mouth Once A Day 4)  Lopid 600 Mg  Tabs (Gemfibrozil) .Marland Kitchen.. 1 By Mouth Bid 5)  Oxybutynin Chloride 5 Mg Tabs (Oxybutynin Chloride) .Marland Kitchen.. 1 Tablet By Mouth Twice A Day 6)  Lisinopril-Hydrochlorothiazide 20-25 Mg Tabs (Lisinopril-Hydrochlorothiazide) .... One Tab By Mouth Daily 7)  Ibuprofen 800 Mg  Tabs (Ibuprofen) .Marland Kitchen.. 1 By Mouth Three Times A Day With Food 8)   Niaspan 750 Mg  Tbcr (Niacin (Antihyperlipidemic)) .... Take 2 By Mouth Qday 9)  Unilet Excelite Ii   Misc (Lancets) .... Use 3-4 X Daily 10)  Truetrack Test   Strp (Glucose Blood) .... Test Blood Glucose One Time Daily 11)  Metformin Hcl 500 Mg Tabs (Metformin Hcl) .... 2 Tabs By Mouth Qam and 1 Tab By Mouth Qhs 12)  Voltaren 1 %  Gel (Diclofenac Sodium) .... Apply 4 G Two Times A Day  To Qid As Needed . Dispense 100 Gm Tube. 13)  Knee Brace .... 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 Bilateral Braces To Fit Pt  Dx: 715.96 15)  Diabetic Shoe .... Please Dispense 1 Pair. Refill X3 16)  Diabetic Shoe Inserts .... Please Use As Directed. Dispense 3 Pair. 17)  Fluphenazine Decanoate 25 Mg/ml Soln (Fluphenazine Decanoate) 18)  Flonase 50 Mcg/act Susp (Fluticasone Propionate) .... One Inhalation in Each Nostril Once A Day.  Allergies (verified): 1)  ! Codeine  Past History:  Past medical, surgical, family and social histories (including risk factors) reviewed, and no changes noted (except as noted below).  Past Medical History: Reviewed history from 03/12/2010 and no changes required. Biliary pancreatitis 6/04 Cervical spondylosis C5-6 Diabetic Retinopathy psychosis NOS HTN DM HLD DJD of bilateral knees  Past Surgical History: Reviewed history  from 03/12/2010 and no changes required. btl--1976 - lap chole for biliary pancreatitis - 04/04/2003  Family History: Reviewed history from 09/14/2007 and no changes required. 3 brothers - HTN, HLD 4 sisters - HLD, HTN Father - unknown mother - DM, CAD, obesity  Social History: Reviewed history from 02/15/2010 and no changes required. smokes 1 ppd x 25 years. down to 1/2 ppd as of 02/2010 Lives with mother Talbert [place]) and is on disability for psychosis.Packs/Day:  1.0  Physical Exam  General:  obese female, NAD. vitals reviewed.  Mouth:  MMM Lungs:  normal respiratory effort and normal breath  sounds.  prolonged expiratory phase.   Heart:  Normal rate and regular rhythm.  Abdomen:  obese, NT, ND, +BS Genitalia:  normal introitus with large palpable mass protruding from vagina. on bimanual exam, cervix and uterus palpated, and mass able to be reduced. cystocele.     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 not on statin. retinopathy. often goes through spells of wanting to check CBGs >once daily (which won't be covered by insurance). this has been explained to her on multiple occasions.   Her updated medication list for this problem includes:    Aspirin Ec 81 Mg Tbec (Aspirin) .Marland Kitchen... Take 1 tablet by mouth every morning    Lisinopril-hydrochlorothiazide 20-25 Mg Tabs (Lisinopril-hydrochlorothiazide) ..... One tab by mouth daily    Metformin Hcl 500 Mg Tabs (Metformin hcl) .Marland Kitchen... 2 tabs by mouth qam and 1 tab by mouth qhs  Orders: [facility]- Est  Level 4 ([address])  Labs Reviewed: Creat: 0.80 (12/20/2009)   Microalbumin: neg (02/16/2008)  Last Eye Exam: retinopathy (03/21/2009) Reviewed HgBA1c results: 7.1 (04/08/2010)  7.0 (12/20/2009)  Problem # 3:  SINUS TACHYCARDIA (ICD-427.89) Assessment: Improved  Her updated medication list for this problem includes:    Aspirin Ec 81 Mg Tbec (Aspirin) .Marland Kitchen... Take 1 tablet by mouth every morning  Problem # 4:  HYPERTRIGLYCERIDEMIA (ICD-272.1) Assessment: Unchanged  on lopid and niaspan.   Her updated medication list for this problem includes:    Lopid 600 Mg Tabs (Gemfibrozil) .Marland Kitchen... 1 by mouth bid    Niaspan 750 Mg Tbcr (Niacin (antihyperlipidemic)) .Marland Kitchen... Take 2 by mouth qday  Labs Reviewed: SGOT: 23 (12/20/2009)   SGPT: 20 (12/20/2009)  Prior 10 Yr  Risk Heart Disease: 24 % (08/16/2008)   HDL:44 (09/14/2007)  LDL:123 (08/17/2008), 110 (09/14/2007)  Chol:206 (09/14/2007)  Trig:258 (09/14/2007), 277 (02/18/2007)  Problem # 5:  HYPERTENSION, BENIGN SYSTEMIC (ICD-401.1) Assessment: Unchanged  at goal.   Her updated medication list for this problem includes:    Lisinopril-hydrochlorothiazide 20-25 Mg Tabs (Lisinopril-hydrochlorothiazide) ..... One tab by mouth daily  BP today: 117/81 Prior BP: 133/88 (03/12/2010)  Prior 10 Yr Risk Heart Disease: 24 % (08/16/2008)  Labs Reviewed: K+: 3.9 (12/20/2009) Creat: : 0.80 (12/20/2009)   Chol: 206 (09/14/2007)   HDL: 44 (09/14/2007)   LDL: 123 (08/17/2008)   TG: 258 (09/14/2007)  Orders: [facility]- Est  Level 4 ([address])  Problem # 6:  HYPERLIPIDEMIA (ICD-272.4) Assessment: Comment Only direct LDL 103 in February 2011.   Her updated medication list for this problem includes:    Lopid 600 Mg Tabs (Gemfibrozil) .Marland Kitchen... 1 by mouth bid    Niaspan 750 Mg Tbcr (Niacin (antihyperlipidemic)) .Marland Kitchen... 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 Cleophas Dunker at [facility]  Her updated medication list for this problem includes:    Aspirin Ec 81 Mg Tbec (Aspirin) .Marland Kitchen... Take 1 tablet by mouth every morning    Ibuprofen 800 Mg Tabs (Ibuprofen) .Marland Kitchen... 1 by mouth three times a day with food  Problem # 8:  TOBACCO DEPENDENCE (ICD-305.1) Assessment: Comment Only precontemplative  Problem # 9:  PSYCHOSIS, UNSPECIFIED (ICD-298.9) Assessment: Comment Only goes to mental health and gets fluphenazine injections.   Other Orders: [facility] [phone])  Laboratory Results   Blood Tests   Date/Time  Received: April 08, 2010 1:26 PM  Date/Time Reported: April 08, 2010 1:44 PM   HGBA1C: 7.1%   (Normal Range: Non-Diabetic - 3-6%   Control Diabetic - 6-8%)  Comments: ...........test performed by...........Marland KitchenTerese Door, CMA      Prevention & Chronic Care Immunizations   Influenza vaccine: Fluvax MCR  (08/22/2009)   Influenza vaccine deferral: Not available  (04/08/2010)   Influenza vaccine due: 08/16/2009    Tetanus booster: 08/16/2008: given   Tetanus booster due: 08/16/2018    Pneumococcal vaccine: Pneumovax  (09/14/2007)   Pneumococcal vaccine deferral: Not indicated  (04/08/2010)   Pneumococcal vaccine due: None  Colorectal Screening   Hemoccult: Not documented   Hemoccult action/deferral: Ordered  (05/11/2009)    Colonoscopy: refused  (08/17/2008)   Colonoscopy action/deferral: Refused  (04/08/2010)   Colonoscopy due: 08/17/2018  Other Screening   Pap smear: normal  (10/26/2007)   Pap smear action/deferral: GYN referral  (04/08/2010)   Pap smear due: 10/25/2010    Mammogram: refused  (08/17/2008)   Mammogram action/deferral: Refused  (04/08/2010)   Mammogram due: 08/17/2009   Smoking status: current  (04/08/2010)   Smoking cessation counseling: yes  (05/11/2009)  Diabetes Mellitus   HgbA1C: 7.1  (04/08/2010)   Hemoglobin A1C due: 11/17/2008    Eye exam: retinopathy  (03/21/2009)   Eye exam due: 03/21/2010    Foot exam: yes  (01/24/2009)   High risk foot: Not documented   Foot care education: completed  (08/17/2008)   Foot exam due: 08/16/2009    Urine microalbumin/creatinine ratio: Not documented   Urine microalbumin action/deferral: Ordered   Urine microalbumin/cr due: 05/11/2010    Diabetes flowsheet reviewed?: Yes   Progress toward A1C goal: At goal  Lipids   Total Cholesterol: 206  (09/14/2007)   Lipid panel action/deferral: Lipid Panel ordered   LDL: 123  (08/17/2008)   LDL Direct: 103  (12/20/2009)   HDL: 44  (09/14/2007)    Triglycerides: 258  (09/14/2007)    SGOT (AST): 23  (12/20/2009)   BMP action: Ordered   SGPT (ALT): 20  (12/20/2009)   Alkaline phosphatase: 83  (12/20/2009)   Total bilirubin: 0.2  (12/20/2009)   Liver panel due: 05/11/2010    Lipid flowsheet reviewed?: Yes   Progress toward LDL goal: At goal  Hypertension   Last Blood Pressure: 117 / 81  (04/08/2010)   Serum creatinine: 0.80  (12/20/2009)   BMP action: Ordered   Serum potassium 3.9  (12/20/2009)    Hypertension flowsheet reviewed?: Yes   Progress toward BP goal: At goal  Self-Management Support :   Personal  Goals (by the next clinic visit) :     Personal A1C goal: 7  (08/02/2009)     Personal blood pressure goal: 130/80  (08/02/2009)     Personal LDL goal: 100  (08/02/2009)    Diabetes self-management support: Written self-care plan  (08/02/2009)    Diabetes self-management support not done because: Good outcomes  (12/20/2009)   Last diabetes self-management training by diabetes educator: completed    Hypertension self-management support: Written self-care plan, Education handout  (12/20/2009)    Hypertension self-management support not done because: Good outcomes  (04/08/2010)    Lipid self-management support: Written self-care plan, Education handout  (12/20/2009)     Lipid self-management support not done because: Good outcomes  (04/08/2010)
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
## 2010-12-05 NOTE — Progress Notes (Signed)
Summary: Rx Req  Phone Note Refill Request Message from:  Patient  Refills Requested: Medication #1:  TRUETRACK TEST   STRP test blood glucose one time daily [facility].  Initial call taken by: Clydell Hakim,  April 22, 2010 11:08 AM  Follow-up for Phone Call        pt notified Follow-up by: Gladstone Pih,  April 22, 2010 11:39 AM    Prescriptions: TRUETRACK TEST   STRP (GLUCOSE BLOOD) test blood glucose one time daily  #31 x 3   Entered and Authorized by:   Lequita Asal  MD   Signed by:   Lequita Asal  MD on 04/22/2010   Method used:   Electronically to        [facility]* (retail)       [address]       [place], [place]  [id number]       Ph: [phone]       Fax: [phone]   RxID:   [id number]

## 2010-12-05 NOTE — Assessment & Plan Note (Signed)
Summary: strained chest,df   Vital Signs:  Patient profile:   55 year old female Weight:      218.6 pounds Temp:     98.2 degrees F oral Pulse rate:   112 / minute Pulse rhythm:   regular BP sitting:   139 / 86  (left arm) Cuff size:   large  Vitals Entered By: Loralee Pacas CMA (February 15, 2010 9:37 AM) CC: chest wall pain, tachycardia Comments pt states that when she was sweeping and mopping last week she strained a muscle in her chest.  she had an xray done on 04.11.2011   Primary Care Provider:  Lequita Asal  MD  CC:  chest wall pain and tachycardia.  History of Present Illness: 55 y/o female c/o  chest pain- started last week with strenuous house work. not associated with shortness of breath, no radiation, diaphoresis, N/V. occasional palpitations. reproducible. left sided. improved with ibuprofen and flexeril. had MSK xrays done at [facility] to eval DJD, not for chest pain.   palpitations- patient drinks 3-4 cups of coffee each morning. has been having occasional episodes of feeling her heart race. no SOB, lightheadedness, or related chest pain.  Habits & Providers  Alcohol-Tobacco-Diet     Tobacco Status: current     Tobacco Counseling: to quit use of tobacco products     Cigarette Packs/Day: 0.5  Current Medications (verified): 1)  Benztropine Mesylate 1 Mg  Tabs (Benztropine Mesylate) .Marland Kitchen.. 1 By Mouth At Bedtime 2)  Aspirin Ec 81 Mg Tbec (Aspirin) .... Take 1 Tablet By Mouth Every Morning 3)  Prilosec 20 Mg Cpdr (Omeprazole) .... Take 1 Capsule By Mouth Once A Day 4)  Lopid 600 Mg  Tabs (Gemfibrozil) .Marland Kitchen.. 1 By Mouth Bid 5)  Oxybutynin Chloride 5 Mg Tabs (Oxybutynin Chloride) .Marland Kitchen.. 1 Tablet By Mouth Twice A Day 6)  Lisinopril-Hydrochlorothiazide 20-25 Mg Tabs (Lisinopril-Hydrochlorothiazide) .... One Tab By Mouth Daily 7)  Ibuprofen 800 Mg  Tabs (Ibuprofen) .Marland Kitchen.. 1 By Mouth Three Times A Day With Food 8)  Niaspan 750 Mg  Tbcr (Niacin (Antihyperlipidemic)) .... Take 2 By  Mouth Qday 9)  Unilet Excelite Ii   Misc (Lancets) .... Use 3-4 X Daily 10)  Truetrack Test   Strp (Glucose Blood) .... Test Blood Glucose One Time Daily 11)  Metformin Hcl 500 Mg Tabs (Metformin Hcl) .... 2 Tabs By Mouth Qam and 1 Tab By Mouth Qhs 12)  Voltaren 1 %  Gel (Diclofenac Sodium) .... Apply 4 G Two Times A Day  To Qid As Needed . Dispense 100 Gm Tube. 13)  Knee Brace .... 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 Bilateral Braces To Fit Pt  Dx: 715.96 15)  Diabetic Shoe .... Please Dispense 1 Pair. Refill X3 16)  Diabetic Shoe Inserts .... Please Use As Directed. Dispense 3 Pair. 17)  Fluphenazine Decanoate 25 Mg/ml Soln (Fluphenazine Decanoate)  Allergies (verified): 1)  ! Codeine  Past History:  Past medical history reviewed for relevance to current acute and chronic problems. Family history reviewed for relevance to current acute and chronic problems.  Past Medical History: Reviewed history from 01/05/2009 and no changes required. baker`s cyst R  knee Biliary pancreatitis 6/04 Cervical spondylosis C5-6 Drug-induced hepatitis (which drug?) Drug-seeking behavior  History of STDs Ophtho- Dr. Eulah Pont [facility])  Family History: Reviewed history from 09/14/2007 and no changes required. 3 brothers - HTN, HLD 4 sisters - HLD, HTN Father -  unknown mother - DM, CAD, obesity  Social History: smokes 1 ppd x 25 years. down to 1/2 ppd as of 02/2010 Lives with mother Talbert Forest Ward) and is on disability for psychosis.Packs/Day:  0.5  Physical Exam  General:  obese female, NAD. vitals reviewed.  Mouth:  poor dentition, MMM Neck:  No deformities, masses, or tenderness noted. no JVD Chest Wall:  +TTP of L chest wall Lungs:  normal respiratory effort and normal breath sounds.  prolonged expiratory phase.   Heart:  tachycardic and regular rhythm. S1 and S2 normal without gallop, murmur, click, rub or other extra  sounds. Abdomen:  obese, NT, ND Extremities:  no edema of BLE   Impression & Recommendations:  Problem # 1:  CHEST WALL PAIN, ANTERIOR (ICD-786.52) Assessment New  EKG unremarkable. pain reproducible. continue ibuprofen since that is providing relief. pain already improving per patient. avoid strenuous activity. no relationship to activity, radiation, diaphoresis, etc.   Her updated medication list for this problem includes:    Aspirin Ec 81 Mg Tbec (Aspirin) .Marland Kitchen... Take 1 tablet by mouth every morning    Ibuprofen 800 Mg Tabs (Ibuprofen) .Marland Kitchen... 1 by mouth three times a day with food  Orders: [facility]- Est  Level 4 (99214)  Problem # 2:  TACHYCARDIA (ICD-785.0) Assessment: New sinus tach on EKG. check TSH and hemoglobin. likely 2/2 caffeine. encouraged to reduce caffeine intake.   Orders: 12 Lead EKG (12 Lead EKG) TSH-[facility] (45409-81191) Hemoglobin-[facility] (85018) [facility]- Est  Level 4 (47829)  Patient Instructions: 1)  Keep taking the ibuprofen and flexeril for the pain in your chest.  2)  Cut down on how much caffeine you drink. It is making your heart beat fast.   Laboratory Results   Blood Tests   Date/Time Received: February 15, 2010 10:16 AM  Date/Time Reported: February 15, 2010 11:12 AM     CBC   HGB:  15.1 g/dL   (Normal Range: 56.2-13.0 in Males, 12.0-15.0 in Females) Comments: venous sample ...............test performed by......Marland KitchenBonnie A. [place], MLS (ASCP)cm

## 2010-12-05 NOTE — Progress Notes (Signed)
Summary: results  Phone Note Call from Patient Call back at Home Phone [phone]   Caller: Patient Summary of Call: wants to know results of lab test,df Initial call taken by: De Nurse,  February 18, 2010 9:15 AM  Follow-up for Phone Call        told her they were both normal Follow-up by: Golden Circle RN,  February 18, 2010 9:20 AM
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
## 2010-12-05 NOTE — Progress Notes (Signed)
Summary: Test strips  Phone Note Call from Patient Call back at Home Phone [phone] Call back at [phone]   Reason for Call: Talk to Nurse Summary of Call: wants to discuss getting rx for test strips to say she checks blood sugar 3-4 x daily.  Initial call taken by: Knox Royalty,  January 15, 2010 1:43 PM  Follow-up for Phone Call        to PCP Follow-up by: Gladstone Pih,  January 15, 2010 1:49 PM  Additional Follow-up for Phone Call Additional follow up Details #1::        was unable to reach patient. I tried to return call to explain to patient again (as I have done previously) that I cannot say that it is medically necessary for her to test her CBGs more than once daily, as she doesn't require insulin and her A1C is basically at goal. that would be fraudulent. I called and spoke to pharmacy and confirmed with them that if I write rx so that she can test more frequently, her insurance company will be paying for strips. Patient is free to purchase more strips on her own if she would like to continue to check her CBGs 3-4x daily. Additional Follow-up by: Lequita Asal  MD,  January 15, 2010 3:43 PM    Additional Follow-up for Phone Call Additional follow up Details #2::    Able to reach pt, and informed of MD message.  Starleen Blue RN  January 15, 2010 4:30 PM
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
## 2010-12-05 NOTE — Progress Notes (Signed)
Summary: refill  Phone Note Refill Request Call back at Home Phone [phone] Message from:  Patient  Refills Requested: Medication #1:  METFORMIN HCL 500 MG TABS 2 tabs by mouth qam and 1 tab by mouth qhs Initial call taken by: De Nurse,  May 07, 2010 9:34 AM  Follow-up for Phone Call       Follow-up by: Golden Circle RN,  May 07, 2010 9:39 AM    Prescriptions: METFORMIN HCL 500 MG TABS (METFORMIN HCL) 2 tabs by mouth qam and 1 tab by mouth qhs  #270 x 0   Entered by:   Golden Circle RN   Authorized by:   Pearlean Brownie MD   Signed by:   Golden Circle RN on 05/07/2010   Method used:   Electronically to        [facility]* (retail)       [address]       [place], [place]  [id number]       Ph: [phone]       Fax: [phone]   RxID:   [id number]

## 2010-12-05 NOTE — Assessment & Plan Note (Signed)
Summary: Krista    Vital Signs:  Patient profile:   55 year old female Weight:      219.1 pounds BMI:     37.16 Temp:     98.9 degrees F oral Pulse rate:   86 / minute Pulse rhythm:   regular BP sitting:   130 / 89  (right arm) Cuff size:   large  Vitals Entered By: Loralee Pacas CMA (December 20, 2009 2:39 PM)  Nutrition Counseling: Patient's BMI is greater than 25 and therefore counseled on weight management options.  Primary Care Provider:  Lequita Asal  MD  CC:  Krista DM, HTN, and HLD.  History of Present Illness: 55 y/o female here for Krista chronic medical issues  DM- CBGs 100s-200s. endorses polyuria and polydipsia. denies hypo or hyperglycemic episodes. on metformin 1000 mg in morning, 500 mg in evening. diabetic retinopathy noted on most recent eye eval.  HTN- on lisinopril-hctz. denies chest pain, SOB, N/V, peripheral edema, headaches.   cough- 1 week of nonproductive cough, sore throat. no rhinorrhea, fever, chills, N/V.   HLD- due for lipid check. on niaspan and gemfibrozil. prior ?intolerance to simvastatin and crestor per patient.   Habits & Providers  Alcohol-Tobacco-Diet     Tobacco Status: current     Tobacco Counseling: to quit use of tobacco products     Cigarette Packs/Day: 1.0  Current Medications (verified): 1)  Benztropine Mesylate 1 Mg  Tabs (Benztropine Mesylate) .Marland Kitchen.. 1 By Mouth At Bedtime 2)  Aspirin Ec 81 Mg Tbec (Aspirin) .... Take 1 Tablet By Mouth Every Morning 3)  Prilosec 20 Mg Cpdr (Omeprazole) .... Take 1 Capsule By Mouth Once A Day 4)  Lopid 600 Mg  Tabs (Gemfibrozil) .Marland Kitchen.. 1 By Mouth Bid 5)  Oxybutynin Chloride 5 Mg Tabs (Oxybutynin Chloride) .Marland Kitchen.. 1 Tablet By Mouth Twice A Day 6)  Lisinopril-Hydrochlorothiazide 20-12.5 Mg Tabs (Lisinopril-Hydrochlorothiazide) .... Take 1 Tablet By Mouth Once A Day 7)  Ibuprofen 800 Mg  Tabs (Ibuprofen) .Marland Kitchen.. 1 By Mouth Three Times A Day With Food 8)  Niaspan 750 Mg  Tbcr (Niacin (Antihyperlipidemic)) ....  Take 2 By Mouth Qday 9)  Unilet Excelite Ii   Misc (Lancets) .... Use 3-4 X Daily 10)  Truetrack Test   Strp (Glucose Blood) .... Test Blood Glucose One Time Daily 11)  Metformin Hcl 500 Mg Tabs (Metformin Hcl) .... 2 Tabs By Mouth Qam and 1 Tab By Mouth Qhs 12)  Voltaren 1 %  Gel (Diclofenac Sodium) .... Apply 4 G Two Times A Day  To Qid As Needed . Dispense 100 Gm Tube. 13)  Knee Brace .... 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 Bilateral Braces To Fit Pt  Dx: 715.96 15)  Diabetic Shoe .... Please Dispense 1 Pair. Refill X3 16)  Diabetic Shoe Inserts .... Please Use As Directed. Dispense 3 Pair. 17)  Fluphenazine Decanoate 25 Mg/ml Soln (Fluphenazine Decanoate)  Allergies (verified): 1)  ! Codeine  Physical Exam  General:  obese female, NAD. vitals reviewed.  Eyes:  EOMI, PERRLA Mouth:  MMM; poor dentition Chest Wall:  TTP of L anterior chest wall Lungs:  normal respiratory effort and normal breath sounds.   Heart:  Normal rate and regular rhythm. S1 and S2 normal without gallop, murmur, click, rub or other extra sounds. Abdomen:  Bowel sounds positive,abdomen soft and non-tender without masses, organomegaly or hernias noted. Extremities:  no edema of BLE   Impression &  Recommendations:  Problem # 1:  DIABETES MELLITUS II, UNCOMPLICATED (ICD-250.00) Assessment Improved no changes. A1C at 7.   [name] updated medication list for this problem includes:    Aspirin Ec 81 Mg Tbec (Aspirin) .Marland Kitchen... Take 1 tablet by mouth every morning    Lisinopril-hydrochlorothiazide 20-25 Mg Tabs (Lisinopril-hydrochlorothiazide) ..... One tab by mouth daily    Metformin Hcl 500 Mg Tabs (Metformin hcl) .Marland Kitchen... 2 tabs by mouth qam and 1 tab by mouth qhs  Orders: A1C-[facility] (04540) [facility]- Est  Level 4 (98119)  Problem # 2:  HYPERTENSION, BENIGN SYSTEMIC (ICD-401.1) Assessment: Unchanged near goal. will change to 20/25 mg tab of lisinopril-hctz.   [name] updated  medication list for this problem includes:    Lisinopril-hydrochlorothiazide 20-25 Mg Tabs (Lisinopril-hydrochlorothiazide) ..... One tab by mouth daily  Orders: Comp Met-[facility] (14782-95621) [facility]- Est  Level 4 (30865)  Problem # 3:  HYPERLIPIDEMIA (ICD-272.4) Assessment: Unchanged check direct LDL. patient opposed to statin at this time, unless LDL grossly above goal.   [name] updated medication list for this problem includes:    Lopid 600 Mg Tabs (Gemfibrozil) .Marland Kitchen... 1 by mouth bid    Niaspan 750 Mg Tbcr (Niacin (antihyperlipidemic)) .Marland Kitchen... Take 2 by mouth qday  Orders: Direct LDL-[facility] (78469-62952) [facility]- Est  Level 4 (84132)  Problem # 4:  TOBACCO DEPENDENCE (ICD-305.1) Assessment: Unchanged patient precontemplative.  Prescriptions: METFORMIN HCL 500 MG TABS (METFORMIN HCL) 2 tabs by mouth qam and 1 tab by mouth qhs  #270 x 1   Entered and Authorized by:   Lequita Asal  MD   Signed by:   Lequita Asal  MD on 12/20/2009   Method used:   Faxed to ...       [facility] (mail-order)       [address] [place], [place]         Ph: [phone]       Fax: [phone]   RxID:   [id number] NIASPAN 750 MG  TBCR (NIACIN (ANTIHYPERLIPIDEMIC)) take 2 by mouth qday  #180 x 1   Entered and Authorized by:   Lequita Asal  MD   Signed by:   Lequita Asal  MD on 12/20/2009   Method used:   Faxed to ...       [facility] (mail-order)       [address] [place], [place]         Ph: [phone]       Fax: [phone]   RxID:   [id number] OXYBUTYNIN CHLORIDE 5 MG TABS (OXYBUTYNIN CHLORIDE) 1 tablet by mouth twice a day  #180 x 1   Entered and Authorized by:   Lequita Asal  MD   Signed by:   Lequita Asal  MD on 12/20/2009   Method used:   Faxed to ...       [facility] (mail-order)       [address]       [place], [place]         Ph: [phone]       Fax: [phone]   RxID:   [id number] LOPID  600 MG  TABS (GEMFIBROZIL) 1 by mouth bid  #180 x 1   Entered and Authorized by:   Lequita Asal  MD   Signed by:   Lequita Asal  MD on 12/20/2009   Method used:   Faxed to ...       [facility] (  mail-order)       [address]       [place], [place]         Ph: [phone]       Fax: [phone]   RxID:   [id number] PRILOSEC 20 MG CPDR (OMEPRAZOLE) Take 1 capsule by mouth once a day  #90 x 1   Entered and Authorized by:   Lequita Asal  MD   Signed by:   Lequita Asal  MD on 12/20/2009   Method used:   Faxed to ...       [facility] (mail[facility]order)       [address] [place], [place]         Ph: [phone]       Fax: [phone]   RxID:   [id number] LISINOPRIL-HYDROCHLOROTHIAZIDE 20-25 MG TABS (LISINOPRIL-HYDROCHLOROTHIAZIDE) one tab by mouth daily  #90 x 1   Entered and Authorized by:   Lequita Asal  MD   Signed by:   Lequita Asal  MD on 12/20/2009   Method used:   Faxed to ...       [facility] (mail-order)       [address] [place], [place]         Ph: [phone]       Fax: [phone]   RxID:   [id number] NIASPAN 750 MG  TBCR (NIACIN (ANTIHYPERLIPIDEMIC)) take 2 by mouth qday  #60 x 0   Entered and Authorized by:   Lequita Asal  MD   Signed by:   Lequita Asal  MD on 12/20/2009   Method used:   Electronically to        [facility]* (retail)       [address]       [place], [place]  [address]       Ph: [phone]       Fax: [phone]   RxID:   [id number]   Laboratory Results   Blood Tests   Date/Time Received: December 20, 2009 2:47 PM  Date/Time Reported: December 20, 2009 3:06 PM   HGBA1C: 7.0%   (Normal Range: Non-Diabetic - 3-6%   Control Diabetic - 6-8%)  Comments: ...............test performed by......Marland KitchenBonnie A. [place], MLS (ASCP)cm.       Prevention & Chronic Care Immunizations   Influenza vaccine: Fluvax  MCR  (08/22/2009)   Influenza vaccine due: 08/16/2009    Tetanus booster: 08/16/2008: given   Tetanus booster due: 08/16/2018    Pneumococcal vaccine: Pneumovax  (09/14/2007)   Pneumococcal vaccine due: None  Colorectal Screening   Hemoccult: Not documented   Hemoccult action/deferral: Ordered  (05/11/2009)    Colonoscopy: refused  (08/17/2008)   Colonoscopy due: 08/17/2018  Other Screening   Pap smear: normal  (10/26/2007)   Pap smear due: 10/25/2010    Mammogram: refused  (08/17/2008)   Mammogram due: 08/17/2009   Smoking status: current  (12/20/2009)   Smoking cessation counseling: yes  (05/11/2009)  Diabetes Mellitus   HgbA1C: 7.0  (12/20/2009)   Hemoglobin A1C due: 11/17/2008    Eye exam: retinopathy  (03/21/2009)   Eye exam due: 03/21/2010    Foot exam: yes  (01/24/2009)   High risk foot: Not documented   Foot care education: completed  (08/17/2008)   Foot exam due: 08/16/2009    Urine microalbumin/creatinine ratio: Not documented   Urine microalbumin action/deferral: Ordered   Urine microalbumin/cr due: 05/11/2010    Diabetes flowsheet  reviewed?: Yes   Progress toward A1C goal: At goal  Lipids   Total Cholesterol: 206  (09/14/2007)   Lipid panel action/deferral: Lipid Panel ordered   LDL: 123  (08/17/2008)   LDL Direct: 123  (08/16/2008)   HDL: 44  (09/14/2007)   Triglycerides: 258  (09/14/2007)    SGOT (AST): 20  (05/11/2009)   BMP action: Ordered   SGPT (ALT): 18  (05/11/2009) CMP ordered    Alkaline phosphatase: 87  (05/11/2009)   Total bilirubin: 0.3  (05/11/2009)   Liver panel due: 05/11/2010    Lipid flowsheet reviewed?: Yes   Progress toward LDL goal: Unchanged  Hypertension   Last Blood Pressure: 130 / 89  (12/20/2009)   Serum creatinine: 0.73  (05/11/2009)   BMP action: Ordered   Serum potassium 4.5  (05/11/2009) CMP ordered     Hypertension flowsheet reviewed?: Yes   Progress toward BP goal: Deteriorated  Self-Management  Support :   Personal Goals (by the next clinic visit) :     Personal A1C goal: 7  (08/02/2009)     Personal blood pressure goal: 130/80  (08/02/2009)     Personal LDL goal: 100  (08/02/2009)    Patient will work on the following items until the next clinic visit to reach self-care goals:     Medications and monitoring: take my medicines every day, check my blood sugar  (08/02/2009)     Eating: eat foods that are low in salt, eat baked foods instead of fried foods  (12/20/2009)     Activity: take a 30 minute walk every day  (12/20/2009)    Diabetes self-management support: Written self-care plan  (08/02/2009)    Diabetes self-management support not done because: Good outcomes  (12/20/2009)   Last diabetes self-management training by diabetes educator: completed    Hypertension self-management support: Written self-care plan, Education handout  (12/20/2009)   Hypertension self-care plan printed.   Hypertension education handout printed    Lipid self-management support: Written self-care plan, Education handout  (12/20/2009)   Lipid self-care plan printed.   Lipid education handout printed
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
## 2010-12-05 NOTE — Letter (Signed)
Summary: cert for diabetic shoes  [facility]  [address]   [place], [place] [address]   Phone: [phone]  Fax: [phone]    [facility]  [address]   [place], [place] [address]   Phone: [phone]  Fax: [phone]    February 20, 2010   Patient:  Krista [name]    To Whom It May Concern:   I certify that all of the following statements are true:  Valicia Rief (DOB 02-20-2056) -has Diabetes Mellitus -has poor circulation -is a patient of mine who I am treating under a comprehensive plan of care for her diabetes -requires special shoes because of her diabetes   Sincerely,    Lequita Asal  MD    ****This is a legal document and cannot be tampered with.  Schools are authorized to verify all information and to do so accordingly.    Signed by Lequita Asal  MD on 02/20/2010 at 10:45 AM  ________________________________________________________________________

## 2010-12-05 NOTE — Assessment & Plan Note (Signed)
Summary: sores in nose/[place]   Vital Signs:  Patient profile:   55 year old female Weight:      216 pounds Temp:     98.2 degrees F oral Pulse rate:   100 / minute Pulse rhythm:   regular BP sitting:   133 / 88  (right arm) Cuff size:   large  Vitals Entered By: Loralee Pacas CMA (Mar 12, 2010 1:29 PM) CC: sores in nose   Primary Care Provider:  Lequita Asal  MD  CC:  sores in nose.  History of Present Illness: 55 y/o female c/o "sores in nose." similar issue 12/10. diagnosed with impetigo and treated with cephalexin. lesions resolved, but then returned 2-3 days ago. no bleeding. scabbed over lesions, R>L nare. patient does endorse some picking at scabs in nose.   ?allergic rhinitis- "sinus headache," drainage, rhinorrhea. tried benadryl. no fever, chills, decreased hearing  Habits & Providers  Alcohol-Tobacco-Diet     Tobacco Status: current     Tobacco Counseling: to quit use of tobacco products     Ophthalmologist: Dr. Eulah Pont     Sports Medicine: Dr. Albertha Ghee  Current Medications (verified): 1)  Benztropine Mesylate 1 Mg  Tabs (Benztropine Mesylate) .Marland Kitchen.. 1 By Mouth At Bedtime 2)  Aspirin Ec 81 Mg Tbec (Aspirin) .... Take 1 Tablet By Mouth Every Morning 3)  Prilosec 20 Mg Cpdr (Omeprazole) .... Take 1 Capsule By Mouth Once A Day 4)  Lopid 600 Mg  Tabs (Gemfibrozil) .Marland Kitchen.. 1 By Mouth Bid 5)  Oxybutynin Chloride 5 Mg Tabs (Oxybutynin Chloride) .Marland Kitchen.. 1 Tablet By Mouth Twice A Day 6)  Lisinopril-Hydrochlorothiazide 20-25 Mg Tabs (Lisinopril-Hydrochlorothiazide) .... One Tab By Mouth Daily 7)  Ibuprofen 800 Mg  Tabs (Ibuprofen) .Marland Kitchen.. 1 By Mouth Three Times A Day With Food 8)  Niaspan 750 Mg  Tbcr (Niacin (Antihyperlipidemic)) .... Take 2 By Mouth Qday 9)  Unilet Excelite Ii   Misc (Lancets) .... Use 3-4 X Daily 10)  Truetrack Test   Strp (Glucose Blood) .... Test Blood Glucose One Time Daily 11)  Metformin Hcl 500 Mg Tabs (Metformin Hcl) .... 2 Tabs By Mouth Qam and 1  Tab By Mouth Qhs 12)  Voltaren 1 %  Gel (Diclofenac Sodium) .... Apply 4 G Two Times A Day  To Qid As Needed . Dispense 100 Gm Tube. 13)  Knee Brace .... 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 Bilateral Braces To Fit Pt  Dx: 715.96 15)  Diabetic Shoe .... Please Dispense 1 Pair. Refill X3 16)  Diabetic Shoe Inserts .... Please Use As Directed. Dispense 3 Pair. 17)  Fluphenazine Decanoate 25 Mg/ml Soln (Fluphenazine Decanoate)  Allergies (verified): 1)  ! Codeine  Past History:  Past Medical History: Biliary pancreatitis 6/04 Cervical spondylosis C5-6 Diabetic Retinopathy psychosis NOS HTN DM HLD DJD of bilateral knees  Past Surgical History: btl--1976 - lap chole for biliary pancreatitis - 04/04/2003  Physical Exam  General:  obese female, NAD. vitals reviewed.  Head:  no TTP over maxillary or frontal sinuses bilaterally Nose:  yellow crusting lesions in bilateral nares, R>L Lungs:  normal respiratory effort and normal breath sounds.  prolonged expiratory phase.   Heart:  Normal rate and regular rhythm. S1 and S2 normal without gallop, murmur, click, rub or other extra sounds.   Impression & Recommendations:  Problem # 1:  IMPETIGO (ZOX-096) Assessment Deteriorated  repeat course of cephalexin.   Orders: [facility]- Est Level  3 (04540)  Problem # 2:  ? of ALLERGIC RHINITIS (ICD-477.9) Assessment: New  patient to try OTC antihistamine (see patient instructions)  Orders: [facility]- Est Level  3 (98119)  Patient Instructions: 1)  You can try LORATADINE, CETIRIZINE, or ALLEGRA (all over the counter) for your allergies/sinuses.  2)  Schedule follow up after Mar 19, 2010 for Diabetes.  Prescriptions: KEFLEX 500 MG CAPS (CEPHALEXIN) one tab by mouth three times a day x7 days.  #21 x 0   Entered and Authorized by:   Lequita Asal  MD   Signed by:   Lequita Asal  MD on 03/12/2010   Method used:   Electronically to        [facility]* (retail)       [address]       [place], [place]  [address]       Ph: [phone]       Fax: [phone]   RxID:   [id number]

## 2010-12-05 NOTE — Miscellaneous (Signed)
  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

## 2010-12-05 NOTE — Progress Notes (Signed)
Summary: labs results  Phone Note Call from Patient Call back at Home Phone [phone]   Caller: Patient Summary of Call: pt is wanting results of her labs Initial call taken by: De Nurse,  April 19, 2010 4:09 PM  Follow-up for Phone Call        to Dr Sandi Mealy Follow-up by: Gladstone Pih,  April 19, 2010 4:31 PM  Additional Follow-up for Phone Call Additional follow up Details #1::        attempted to call to let her know all of her labs are negative (except pap still pending).  she was at church.  will try again later Additional Follow-up by: Ancil Boozer  MD,  April 21, 2010 9:47 AM    Additional Follow-up for Phone Call Additional follow up Details #2::    please call patient and let her know all of her STD tests wer negative but that we are still waiting on her pap smear results.  I was unable to get in touch with her Sunday.  Follow-up by: Ancil Boozer  MD,  April 21, 2010 10:47 PM  Additional Follow-up for Phone Call Additional follow up Details #3:: Details for Additional Follow-up Action Taken: Patient informed of above Additional Follow-up by: Garen Grams LPN,  April 22, 2010 9:47 AM

## 2010-12-05 NOTE — Progress Notes (Signed)
Summary: triage  Phone Note Call from Patient Call back at Home Phone [phone]   Caller: Patient Summary of Call: had bm and saw a little blood and needs to talk to nurse Initial call taken by: De Nurse,  April 11, 2010 1:46 PM  Follow-up for Phone Call        was constipated & has hemmorhoids. states she only sees blood when she is constipated. suggested OTC stool softners. may try Tucks to soothe hemmorhoids or hemmrhoid creme.  wanted rx so she would not have to pay for them. told her they are OTC but I will send message to pcp Follow-up by: Golden Circle RN,  April 11, 2010 1:54 PM

## 2010-12-05 NOTE — Progress Notes (Signed)
Summary: triage  Phone Note Call from Patient Call back at [phone]   Caller: Patient Summary of Call: Wondering if she can have antibotics for congestion in her chest.   Initial call taken by: Clydell Hakim,  February 14, 2010 9:36 AM  Follow-up for Phone Call        told her she had to be seen first. has an appt in am here. advised continue her ibu with food. stop smoking! says she has bronchitis Follow-up by: Golden Circle RN,  February 14, 2010 9:42 AM

## 2010-12-31 ENCOUNTER — Encounter: Payer: Self-pay | Admitting: Family Medicine

## 2010-12-31 ENCOUNTER — Ambulatory Visit (INDEPENDENT_AMBULATORY_CARE_PROVIDER_SITE_OTHER): Payer: PRIVATE HEALTH INSURANCE | Admitting: Family Medicine

## 2010-12-31 VITALS — BP 121/80 | HR 85 | Wt 200.0 lb

## 2010-12-31 DIAGNOSIS — K219 Gastro-esophageal reflux disease without esophagitis: Secondary | ICD-10-CM

## 2010-12-31 DIAGNOSIS — M1710 Unilateral primary osteoarthritis, unspecified knee: Secondary | ICD-10-CM

## 2010-12-31 DIAGNOSIS — E1139 Type 2 diabetes mellitus with other diabetic ophthalmic complication: Secondary | ICD-10-CM

## 2010-12-31 DIAGNOSIS — E119 Type 2 diabetes mellitus without complications: Secondary | ICD-10-CM

## 2010-12-31 DIAGNOSIS — I10 Essential (primary) hypertension: Secondary | ICD-10-CM

## 2010-12-31 DIAGNOSIS — E785 Hyperlipidemia, unspecified: Secondary | ICD-10-CM

## 2010-12-31 DIAGNOSIS — N3941 Urge incontinence: Secondary | ICD-10-CM

## 2010-12-31 DIAGNOSIS — E781 Pure hyperglyceridemia: Secondary | ICD-10-CM

## 2010-12-31 DIAGNOSIS — F17200 Nicotine dependence, unspecified, uncomplicated: Secondary | ICD-10-CM

## 2010-12-31 NOTE — Progress Notes (Signed)
  Subjective:     Krista [name] is a 55 y.o. female who presents for follow up of diabetes.. Current symptoms include: none. Patient denies foot ulcerations, hyperglycemia, hypoglycemia , polydipsia, polyuria and visual disturbances. Evaluation to date has been: hemoglobin A1C. Home sugars: BGs are running  consistent with Hgb A1C, BGs range between 80 and 150. Current treatments: more intensive attention to diet which has been effective, weight loss of 20 lbs in past year lbs which has been effective, Continued metformin which has been effective and Continued ACE inhibitor/ARB which has been effective. Krista [name] says she has been getting more exercise, and eating more vegetables, and she is very excited to have lost so much weight.  Krista [name] does complain that her insurance will not pay for her diabetic shoes, and that they would cost her $70-80.    She denies any problems taking her blood pressure medications, and denies any chest pain, shortness of breath, headaches.   Patient does complain that her knees have been bothering her more this winter, and says she did not have an injury recently, but feels like her right knee has "bone on bone."  She has not seen her orthopedist recently, but thinks she will try to make an appointment with them soon.   Krista [name] is still smoking about 1/2 ppd.  She has cut back a lot in the past year (from 1ppd).  She says she uses the nicotine gum which helps some, but is not ready to quit, she wants to keep slowly decreasing how much she smokes.   Review of Systems Pertinent items are noted in HPI.    Objective:    BP 121/80  Pulse 85  Wt 200 lb (90.719 kg)  LMP 12/31/2009 General appearance: alert, cooperative and no distress Eyes: conjunctivae/corneas clear. PERRL, EOM's intact. Fundi benign. Throat: lips, mucosa, and tongue normal; teeth and gums normal Neck: no adenopathy, no JVD, supple, symmetrical, trachea midline and thyroid not enlarged, symmetric, no  tenderness/mass/nodules Lungs: clear to auscultation bilaterally Heart: regular rate and rhythm, S1, S2 normal, no murmur, click, rub or gallop Abdomen: soft, non-tender; bowel sounds normal; no masses,  no organomegaly Extremities: extremities normal, atraumatic, no cyanosis or edema and +crepitus over bilateral knees, but no swelling, erythema, effusion, or warmth.  Pulses: 2+ and symmetric   Laboratory:    Hemoglobin A1C= 6.1 Assessment:    Diabetes mellitus Type II, under good control. Diabetes related complications: peripheral neuropathy    Plan:    Counseling at today's visit: Pt advised to quit smoking, given quit suggestions hand out. Asked her to let me know when she was ready to quit. . Encouraged aerobic exercise. Discussed foot care. Reminded to get yearly retinal exam. Follow up in 6 month or as needed.  Asked her to schedule her next visit as a Physical and will plan on addressing health maintenance.

## 2010-12-31 NOTE — Assessment & Plan Note (Signed)
A1c 6.1%, well controlled. 

## 2010-12-31 NOTE — Patient Instructions (Addendum)
It was good to see you.  You have lost about 20 pounds since last February.  Great job, keep it up!  Your hemoglobin A1c, the long-term measure of your blood sugar is 6.1, which shows your diabetes is under control.  Four blood pressure is 120/80- also well controlled.  Way to go!    I would like to see you in 3-6 months for a Well Woman exam (physical).  Please contact the office if you have problems or questions before then.    Please keep trying to quit smoking- it is one of the healthiest things you can do for yourself.  Place diabetes mellitus type 2 patient instructions here.

## 2011-01-01 ENCOUNTER — Encounter: Payer: Self-pay | Admitting: Family Medicine

## 2011-01-01 MED ORDER — NIACIN ER (ANTIHYPERLIPIDEMIC) 750 MG PO TBCR: 1500.0000 mg | EXTENDED_RELEASE_TABLET | Freq: Every day | ORAL | Status: DC

## 2011-01-01 MED ORDER — OXYBUTYNIN CHLORIDE 5 MG PO TABS: 5.0000 mg | ORAL_TABLET | Freq: Two times a day (BID) | ORAL | Status: DC

## 2011-01-01 MED ORDER — IBUPROFEN 800 MG PO TABS: 800.0000 mg | ORAL_TABLET | Freq: Three times a day (TID) | ORAL | Status: DC

## 2011-01-01 MED ORDER — GEMFIBROZIL 600 MG PO TABS: 600.0000 mg | ORAL_TABLET | Freq: Two times a day (BID) | ORAL | Status: DC

## 2011-01-01 MED ORDER — METFORMIN HCL 500 MG PO TABS: 500.0000 mg | ORAL_TABLET | Freq: Two times a day (BID) | ORAL | Status: DC

## 2011-01-01 MED ORDER — OMEPRAZOLE 20 MG PO CPDR: 20.0000 mg | DELAYED_RELEASE_CAPSULE | Freq: Every day | ORAL | Status: DC

## 2011-01-01 NOTE — Assessment & Plan Note (Signed)
Patient with significant pain but controlled on non-narcotic medications and she is still able to exercise.  Continue current medications for now.  I have asked her to make an appointment with Orthopedic surgery.  Considering her weight loss and improved control of her chronic medical conditions, she may be a surgical candidate for knee replacement.

## 2011-01-01 NOTE — Assessment & Plan Note (Signed)
Continue current medication.

## 2011-01-01 NOTE — Assessment & Plan Note (Signed)
Continue current regimen

## 2011-01-01 NOTE — Assessment & Plan Note (Signed)
Well controlled. Continue current medications  

## 2011-01-01 NOTE — Assessment & Plan Note (Signed)
Patient strongly encouraged to quit.  Given quit smoking hand out.

## 2011-01-13 ENCOUNTER — Encounter: Payer: Self-pay | Admitting: *Deleted

## 2011-01-20 LAB — URINALYSIS, ROUTINE W REFLEX MICROSCOPIC
Bilirubin Urine: NEGATIVE
Glucose, UA: NEGATIVE mg/dL
Hgb urine dipstick: NEGATIVE
Ketones, ur: NEGATIVE mg/dL
Nitrite: NEGATIVE
Nitrite: NEGATIVE
Specific Gravity, Urine: 1.006 (ref 1.005–1.030)
Specific Gravity, Urine: 1.006 (ref 1.005–1.030)
pH: 6 (ref 5.0–8.0)
pH: 6.5 (ref 5.0–8.0)

## 2011-01-20 LAB — URINE CULTURE: Colony Count: 80000

## 2011-01-20 LAB — WET PREP, GENITAL: Yeast Wet Prep HPF POC: NONE SEEN

## 2011-03-05 ENCOUNTER — Ambulatory Visit: Payer: PRIVATE HEALTH INSURANCE | Admitting: Family Medicine

## 2011-03-21 NOTE — Discharge Summary (Signed)
NAME:  Krista [name], Krista [name]                        ACCOUNT NO.:  [id number]   MEDICAL RECORD NO.:  [id number]                   PATIENT TYPE:  INP   LOCATION:  3031                                 FACILITY:  [facility]   PHYSICIAN:  Asencion Partridge, M.D.                  DATE OF BIRTH:  September 06, 1956   DATE OF ADMISSION:  04/24/2003  DATE OF DISCHARGE:  04/30/2003                                 DISCHARGE SUMMARY   DISCHARGE DIAGNOSES:  1. Biliary pancreatitis.  2. Hypertension.  3. Diabetes mellitus.  4. Constipation.   DISCHARGE MEDICATIONS:  1. Darvocet-N 100 one tablet q.4h. p.r.n. for pain.  2. Zocor 20 mg daily.  3. Glucophage 500 mg 2 tablets b.i.d.  4. Prilosec over-the-counter 20 mg daily.  5. Monopril 20 mg daily.  6. Hydrochlorothiazide 12.5 mg daily.  7. Relafen 500 mg b.i.d.  8. Ditropan 5 mg daily.  9. Prolixin at previous home dose.  10.      Lopid 600 mg b.i.d.  11.      Fish oil tablets.   SPECIAL INSTRUCTIONS:  No heavy lifting of more than 5 pounds.  Continue to  follow low-salt, low-fat, appropriate diabetic diet.  Wound care per Dr.  Janee Morn.   DISPOSITION:  The patient was discharged home in stable condition.   FOLLOW UP:  1. Patient instructed to call the family practice center to see her primary     physician in two weeks.  2. Patient to call [facility] at [phone] for an appointment     within the next two to three days.   HISTORY OF PRESENT ILLNESS:  Krista [name] is a 55 year old female with  history of obesity, hypercholesterolemia, and diabetes mellitus, who  presented with sudden onset of postprandial left lower quadrant pain that  was sharp, aching, and severe.  It was exacerbated by eating.  No known  history of gallbladder disease, no jaundice or icterus that she noted.  She  had emesis x 4 that was not bloody and not bilious.   PHYSICAL EXAMINATION:  GENERAL:  Obese female in moderate distress.  VITAL SIGNS:  Tachycardia at 110,  blood pressure hypertensive at 182/103.  ABDOMEN:  Nondistended without bowel sounds, soft with bilateral upper  quadrant tenderness to palpation but mostly in the left lower quadrant.  No  guarding or rebound.   LABORATORY DATA:  Urinalysis not consistent with infection. White count  16.1, hemoglobin 14.6, ANC 12.8.  Amylase 1612, lipase 4087.  Total  bilirubin 3.4, alkaline phosphatase 165, AST 152, ALT 193.   HOSPITAL COURSE:  1. ABDOMINAL PAIN WITH ELEVATED LIPASE, AMYLASE, CONCERNING FOR PANCREATITIS     WITH HYPERBILIRUBINEMIA:  Was consistent with a biliary pancreatitis, and     abdominal ultrasound was obtained.  It was a limited study secondary to     patient's body habitus.  This showed small gallstone.  Common bile  duct     was 9 to 10 mm which was borderline dilation.  Dr. Laural Benes, from     gastroenterology, was consulted and recommended an MRCP which small     stones in the gallbladder with no stones in the duct.  Dr. Laural Benes     interpreted this as a likely passed stone as there biliary duct dilation     at the time of the MRCP.  They followed the patient's clinical exam and     enzymes.  When they had nearly normalized, Dr. Janee Morn took the patient     for a laparoscopic cholecystectomy on April 28, 2003.  The patient had an     uncomplicated postoperative course.  Her pain was significantly improved     postoperatively.   1. HYPERTENSION:  Medication adjustments were made to attempt to achieve     better blood pressure control.  Hydrochlorothiazide was begun.  Monopril     was continued but at an increased dose.   1. HYPERCHOLESTEROLEMIA:  Fasting lipid profile was obtained.  Total     cholesterol 206, triglycerides 468, HDL 41.  Continued patient's Lopid     and started her on fish oil.  Zocor was also continued.   1. DIABETES MELLITUS:  Fairly well controlled throughout hospitalization on     Glucophage.   1. CONSTIPATION:  Continued bowel regimen.    CONSULTATIONS:  1. Gabrielle Dare. Janee Morn, M.D., general surgery  2. Danise Edge, M.D., gastroenterology.   PROCEDURES:  1. MRCP April 25, 2003.  2. Laparoscopic cholecystectomy by Dr. Janee Morn on April 28, 2003.  Please     see his dictated note for details.     Barney Drain, M.D.                       Asencion Partridge, M.D.    SS/MEDQ  D:  05/18/2003  T:  05/19/2003  Job:  [id number]   cc:   Bradly Bienenstock, M.D.  [facility] Resident  [place], [place] [address]  Fax: [phone]   Gabrielle Dare. Janee Morn, M.D.  [facility]  [address] [place], [place] [address]  Fax: [phone]    cc:   Bradly Bienenstock, M.D.  [facility] Resident  [place], [place] [address]  Fax: [phone]   Gabrielle Dare. Janee Morn, M.D.  [facility]  [address] [place], [place] [address]  Fax: [phone]

## 2011-03-21 NOTE — Op Note (Signed)
NAME:  Krista [name], Krista [name]                        ACCOUNT NO.:  [id number]   MEDICAL RECORD NO.:  [id number]                   PATIENT TYPE:  INP   LOCATION:  3031                                 FACILITY:  [facility]   PHYSICIAN:  Gabrielle Dare. Janee Morn, M.D.             DATE OF BIRTH:  [birth year]   DATE OF PROCEDURE:  04/28/2003  DATE OF DISCHARGE:                                 OPERATIVE REPORT   PREOPERATIVE DIAGNOSIS:  Biliary pancreatitis.   POSTOPERATIVE DIAGNOSIS:  Biliary pancreatitis.   PROCEDURE:  Laparoscopic cholecystectomy with intraoperative cholangiogram.   SURGEON:  Gabrielle Dare. Janee Morn, M.D.   ASSISTANT:  Jimmye Norman, M.D.   ANESTHESIA:  General.   HISTORY OF PRESENT ILLNESS:  The patient is a 55 year old female who was  admitted to the [facility] with acute pancreatitis.  Workup  revealed this was likely from biliary origin.  Amylase and lipase returned  down towards normal levels.  An MRCP was done which showed no filling defect  in the common bile duct.  She was brought to the operating room for  laparoscopic cholecystectomy with intraoperative cholangiogram.   PROCEDURE IN DETAIL:  Informed consent had been obtained.  The patient  received intravenous antibiotics.  She was brought to the operating room.  General anesthesia was administered.  Her abdomen was prepped and draped in  a sterile fashion.  A small midline incision was made above the umbilicus.  Subcutaneous tissues were dissected down.  The fascia was divided, and the  peritoneal cavity was entered under direct vision.  A 0 Vicryl pursestring  suture was placed around the fascial opening, a blunt trocar was inserted  into the abdomen, and the abdomen was insufflated with carbon dioxide in the  standard fashion.  Subsequently, an 11-mm epigastric port and two 5-mm  lateral ports were placed under direct vision.  At this time, the dome of  the gallbladder was retracted superomedially, and it was  noted to be very  inflamed down around the infundibulum area.  These attachments were taken  down with care not to damage the nearby colon and duodenum.  It was  extremely inflamed in the area.  Once the infundibulum was freed up a little  bit more, further dissection revealed a large vein running with the cystic  artery in the anteromedial part of the infundibulum.  This was dissected  free, and clipped twice proximally, once distally, and divided.  Once the  cystic artery was out of the way, the infundibulo/cystic duct junction was  more easily visualized.  Dissecting further down on the cystic duct was not  possible secondary to the extreme amount of inflammation down in that area.  In order to be safe, we stayed high on the infundibulo/cystic duct junction.  This was dissected out circumferentially with a good window between the  liver and cystic duct and the infundibulum there.  Clip was placed on the  infundibulum.  A small nick was made in the first part of the cystic duct,  and intraoperative cholangiogram was shot with a Reddick catheter.  This  demonstrates no common bile duct filling defects and quite a long cystic  duct.  Once this was accomplished, cholangiogram catheter was removed.  Two  clips were proximally on the cystic duct, and due to the extreme  inflammation, we had to do some more dissection in order to place the third  proximal clip.  This was accomplished, and it traversed the cystic duct, but  due to the intense amount of inflammation down in that area, we planned on  leaving a drain later in the case.  The gallbladder was then removed from  the liver bed with Bovie cautery.  Large veins were encountered which were  controlled with Bovie cautery, and one posterior branch of the cystic artery  was also encountered.  These was clipped twice proximally and divided.  The  gallbladder was removed from the liver bed.  Good hemostasis was obtained.  The gallbladder was  placed in an EndoCatch bag and taken out of the abdomen  via the umbilical port site.  The abdomen was copiously irrigated with 2 L  of saline.  A few areas of bleeding on the liver bed were controlled with  Bovie cautery with excellent hemostasis.  A 19 Blake drain was placed in the  gallbladder fossa and brought out through the lateral 5-mm port site.  The  area was rechecked.  There was no bleeding and no bile effluent was noted  from the clip area.  After close inspection and pneumoperitoneum was  released, the ports were removed under direct vision.  The drain was secured  to the skin with 2-0 nylon suture.  The pneumoperitoneum was released, and  the Hasson trocar was removed from the umbilical port site.  The fascia  above the umbilicus was closed by tying the pursestring 0 Vicryl suture.  All three remaining wounds were copiously irrigated and then closed with  running 4-0 Vicryl suture in a subcuticular stitch.  Sponge, needle, and  instrument counts were correct.  The patient tolerated the procedure well  without apparent complication and was taken to the recovery room in stable  condition.                                               Gabrielle Dare Janee Morn, M.D.    BET/MEDQ  D:  04/28/2003  T:  04/29/2003  Job:  [id number]

## 2011-03-21 NOTE — Consult Note (Signed)
NAME:  Krista [name], Krista [name]                        ACCOUNT NO.:  [id number]   MEDICAL RECORD NO.:  [id number]                   PATIENT TYPE:  INP   LOCATION:  3031                                 FACILITY:  [facility]   PHYSICIAN:  Gabrielle Dare. Janee Morn, M.D.             DATE OF BIRTH:  Feb 13, 1956   DATE OF CONSULTATION:  04/24/2003  DATE OF DISCHARGE:                                   CONSULTATION   REASON FOR CONSULTATION:  Biliary pancreatitis.   HISTORY OF PRESENT ILLNESS:  I was asked to evaluate this 55 year old white  female by Dr. Olene Floss from the family practice service in regards to her  pancreatitis of likely biliary origin.  The patient was admitted early this  morning with a four day history of left upper quadrant abdominal pain with  some associated nausea and vomiting.  She has this increasing pain which was  progressive and she sought evaluation.  Currently, the patient claims that  the pain is a little bit better, but it is still present.  She has an NG  tube in and denies any further nausea, otherwise she has no other  complaints.  She has had no change in her stools, the last bowel movement  was normal earlier today.   PAST MEDICAL HISTORY:  1. Hypertension  2. Hypercholesterolemia.  3. Obesity.  4. Diabetes mellitus.   PAST SURGICAL HISTORY:  Bilateral tubal ligation.   MEDICATIONS CURRENTLY IN HOSPITAL:  Dilaudid PCA, sliding scale insulin,  Protonix and Phenergan.   ALLERGIES:  CODEINE (itching.   SOCIAL HISTORY:  She smokes one pack per day.  She occasionally drinks  alcohol especially on the weekends.   FAMILY HISTORY:  Diabetes, coronary artery disease in her mother.   REVIEW OF SYSTEMS:  GENERAL:  Negative.  CARDIOVASCULAR:  Negative.  PULMONARY:  Negative.  GI:  Please see history of present illness.  MUSCULOSKELETAL:  Negative.  Her remainder of review of systems is  noncontributory.   PHYSICAL EXAMINATION:  VITAL SIGNS:  Temperature 98.1, pulse 113,  respirations 22, blood pressure 205/113.  GENERAL:  She is awake, alert and in no acute distress.  NECK:  Supple.  LUNGS:  Clear to auscultation bilaterally.  HEART:  Regular rate and rhythm.  PMI is palpable on the left.  Distal  pulses are 1+ and equal bilaterally.  ABDOMEN:  Soft.  She has some minimal epigastric and upper quadrant  tenderness.  No guarding or peritoneal signs.  She has a left umbilical scar  from her tubal ligation in the past.  SKIN:  No rashes are present.  Data reviewed includes a white blood cell count 12.3, hemoglobin 14.8,  hematocrit 42.4, platelet 279,000.  Sodium 135, potassium 3.6, chloride 103,  CO2 20, BUN 9, creatinine 0.9, glucose 263, lipase 4087, amylase 1615, AST  152, ALT 193, alkaline phosphatase 165, bilirubin 3.4.  She had a another  total bilirubin done later  this morning which was 5.2.  Ultrasound of the  abdomen by report shows some mild common duct dilatation and small  gallstones.   IMPRESSION:  A 55 year old white female with biliary pancreatitis.   RECOMMENDATIONS:  1. Continue bowel rest, intravenous fluids and consider some intravenous     antibiotics.  I would also recommend gastrointestinal evaluation for     possible ERCP, this was discussed with Dr. Olene Floss.  2. Continue to follow up her amylase, lipase and liver function tests in the     morning and I will plan to do a cholecystectomy once the patient's     pancreatitis improves.  I will follow her along with you.   Thank you very much for the consult.     Gabrielle Dare Janee Morn, M.D.                   Gabrielle Dare Janee Morn, M.D.    BET/MEDQ  D:  04/24/2003  T:  04/25/2003  Job:  [id number]   cc:   Barney Drain, M.D.  [facility].  Family Prac. Resident  [place]  [place] [address]  Fax: [phone]

## 2011-03-21 NOTE — Discharge Summary (Signed)
NAME:  Krista [name], Krista [name]                        ACCOUNT NO.:  [id number]   MEDICAL RECORD NO.:  [id number]                   PATIENT TYPE:  IPS   LOCATION:  0406                                 FACILITY:  [facility]   PHYSICIAN:  Jeanice Lim, M.D.              DATE OF BIRTH:  28-Feb-1956   DATE OF ADMISSION:  05/20/2003  DATE OF DISCHARGE:  05/31/2003                                 DISCHARGE SUMMARY   IDENTIFYING DATA:  This is a 55 year old Caucasian female brought in by the  police floridly psychotic.  Had been released from [facility], was  hollering at cars, pulling clothing off in front yard, and walking in the  traffic.  Followed by Dr. Hortencia Pilar from [facility].   MEDICATIONS:  1. Prolixin Decanoate.  2. Hydrochlorothiazide.  3. Zocor.  4. Lopid.  5. Relafen.  6. Ditropan.  7. Glucophage.  8. Glucotrol.  9. Monopril.  10.      Darvocet p.r.n.   ALLERGIES:  To CODEINE.   PHYSICAL EXAMINATION:  GENERAL:  Physical exam is essentially within normal  limits, performed in the emergency room.  NEUROLOGICAL:  Nonfocal.   ROUTINE ADMISSION LABS:  Elevated enzymes.  Urine drug screen negative.  CBC  within normal limits.  Glucose elevated at 165.   MENTAL STATUS EXAMINATION:  Obese female, redirectible, with bright smile,  affect.  ________ pressure, some latency.  Mood elevated, irritable at  times.  Thought processes rapid, disorganized.  Some internal preoccupation.  Cognitively intact.  Judgment and insight poor.  Intelligence average.   ADMISSION DIAGNOSES:   AXIS I:  Bipolar disorder, type 1, manic.   AXIS II:  None.   AXIS III:  1. Diabetes mellitus type 2.  2. Gastroesophageal reflux disease.  3. Hypertension.  4. Hepatitis C, by history.  5. Elevated liver function tests.   AXIS IV:  Moderate stressors related to support system.   AXIS V:  22/55.   Patient was admitted, ordered routine p.r.n. medications, underwent further  monitoring,  was encouraged to participate in the individual, group, and  milieu therapy.  Due to agitation at the time of admission, patient was  given Geodon 20 mg IM and Ativan, and patient was responsive to this.  She  believed that she was pregnant with a dog and was giving birth to this dog.  She remained intrusive, confused at times, labile, lifting her shirt up in  the hallway, rubbing her belly, talking about her new puppy, and she was  optimized on Geodon, Prolixin, and Cogentin.  For extrapyramidal symptoms,  patient was further optimized on Geodon to 100 mg b.i.d. and had a positive  response with decrease for psychotic symptoms, was more reality based,  demonstrating no side effects.  Klonopin was given adjunctively, and patient  cleared further.   CONDITION AT DISCHARGE:  Markedly improved.  Mood was more euthymic, affect  brighter, thought  process goal-directed, thought content negative for  dangerous ideation or psychotic symptoms.   PATIENT WAS DISCHARGED ON MEDICATIONS:  1. Colace 100 mg b.i.d.  2. Trazodone 100 mg q.h.s.  3. Prolixin 5 mg t.i.d.  4. Cogentin 0.5 mg t.i.d.  5. Geodon 60 mg b.i.d. and 40 mg b.i.d.  6. Nasonex.  7. Lopid 600 mg q.a.m.  8. Relafen 500 mg b.i.d.  9. Glucophage 500 mg two tabs twice per day.  10.      Prolixin Decanoate 37.5 mg IM every three weeks, next on June 13, 2003.  11.      Klonopin 1 mg q.h.s.  12.      Ambien 10 mg q.h.s. p.r.n.  13.      Zocor 20 mg q.6h. p.r.n.  14.      Prinivil 20 mg q.a.m.  15.      Hydrochlorothiazide 12.5 mg q.a.m.  16.      Ditropan 5 mg q.a.m.  17.      Protonix 40 mg q.a.m.   Patient is to follow up with Dr. Lang Snow on Thursday, June 01, 2003.   DISCHARGE DIAGNOSES:   AXIS I:  Bipolar disorder, type 1, manic.   AXIS II:  None.   AXIS III:  1. Diabetes mellitus type 2.  2. Gastroesophageal reflux disease.  3. Hypertension.  4. Hepatitis C, by history.  5. Elevated liver function tests.   AXIS  IV:  Moderate stressors related to support system.   AXIS V:  Global assessment of functioning on discharge was 50.                                               Jeanice Lim, M.D.    JEM/MEDQ  D:  06/22/2003  T:  06/23/2003  Job:  [id number]

## 2011-03-25 ENCOUNTER — Encounter: Payer: Self-pay | Admitting: Family Medicine

## 2011-03-25 ENCOUNTER — Ambulatory Visit (INDEPENDENT_AMBULATORY_CARE_PROVIDER_SITE_OTHER): Payer: PRIVATE HEALTH INSURANCE | Admitting: Family Medicine

## 2011-03-25 DIAGNOSIS — F17200 Nicotine dependence, unspecified, uncomplicated: Secondary | ICD-10-CM

## 2011-03-25 DIAGNOSIS — F29 Unspecified psychosis not due to a substance or known physiological condition: Secondary | ICD-10-CM

## 2011-03-25 DIAGNOSIS — E119 Type 2 diabetes mellitus without complications: Secondary | ICD-10-CM

## 2011-03-25 DIAGNOSIS — E785 Hyperlipidemia, unspecified: Secondary | ICD-10-CM

## 2011-03-25 DIAGNOSIS — E781 Pure hyperglyceridemia: Secondary | ICD-10-CM

## 2011-03-25 DIAGNOSIS — E1139 Type 2 diabetes mellitus with other diabetic ophthalmic complication: Secondary | ICD-10-CM

## 2011-03-25 LAB — POCT GLYCOSYLATED HEMOGLOBIN (HGB A1C): Hemoglobin A1C: 6.5

## 2011-03-25 MED ORDER — BENZTROPINE MESYLATE 1 MG PO TABS: 1.0000 mg | ORAL_TABLET | Freq: Every day | ORAL | Status: DC

## 2011-03-25 MED ORDER — NIACIN ER (ANTIHYPERLIPIDEMIC) 750 MG PO TBCR: 1500.0000 mg | EXTENDED_RELEASE_TABLET | Freq: Every day | ORAL | Status: DC

## 2011-03-25 NOTE — Progress Notes (Signed)
  Subjective:    Patient ID: Krista [name], female    DOB: 1955/11/22, 55 y.o.   MRN: [id number]  HPI Krista [name] presents for follow up of chronic medical problems.  DM- patient reports fasting blood sugars 80's to 130's, and PM sugars of 170-180.  She says she has not noticed any loss of sensation or open sores on her feet, and has been checking almost daily.    Psych- pt says that she has been to behavioral health recently, who refilled her cogentin, but that the Rx has not come through.  She has been without the medication for 2-3 weeks.  She says she has not missed an appt there and does not know why it has not been filled.   HLD- patient has not had cholesterol checked in a long time, but is taking her medications as prescribed (niaspan, lopid).  She has not made diet modifications.  Tobacco use- pt is smoking less than one pack per day, and says she is thinking about quitting, but is not ready yet.    Review of Systems  Constitutional: Negative for unexpected weight change.  HENT: Negative for rhinorrhea.   Eyes: Negative for visual disturbance.  Respiratory: Negative for cough.   Cardiovascular: Negative for chest pain.  Gastrointestinal: Negative for abdominal pain.  Genitourinary: Negative for difficulty urinating.  Musculoskeletal: Positive for arthralgias.  Skin: Negative for rash.  Neurological: Negative for dizziness.  Psychiatric/Behavioral: Negative for hallucinations, dysphoric mood and agitation.       Objective:   Physical Exam BP 144/82  Pulse 88  Temp(Src) 99 F (37.2 C) (Oral)  Wt 208 lb (94.348 kg) Eyes: conjunctivae/corneas clear. PERRL, EOM's intact. Fundi benign. Throat: oral mucosa moist, no lesions or exudates, dentition poor.  Lungs: clear to auscultation bilaterally Heart: regular rate and rhythm, S1, S2 normal, no murmur, click, rub or gallop Abdomen: soft, non-tender; bowel sounds normal; no masses,  no organomegaly and obese.  Extremities:  extremities normal, atraumatic, no cyanosis or edema and Patient has no visible ulcers or open sores on feet or legs.  Pulses: 2+ and symmetric Skin: Skin color, texture, turgor normal. No rashes or lesions Neurologic: Grossly normal, patient has good sensation throghout        Assessment & Plan:

## 2011-03-25 NOTE — Patient Instructions (Signed)
It was good to see you today.  Your Hemoglobin A1C is  Lab Results  Component Value Date   HGBA1C 6.5 03/25/2011  .  Remember your goal for A1C is less than 7.  Your goal for fasting morning blood sugar is 80-120.   Great job!    I want to check your cholesterol, so please make a lab appointment for first thing in the morning before you've eaten anything.  Please get this done before your next appointment.  Make an appointment to see me again in about 3 months.

## 2011-03-26 ENCOUNTER — Encounter: Payer: Self-pay | Admitting: Family Medicine

## 2011-03-26 NOTE — Assessment & Plan Note (Signed)
Patient has not had lipid panel in several years, will order.

## 2011-03-26 NOTE — Assessment & Plan Note (Addendum)
Patient is out of cogentin and having difficulty with script from behavioral health.  She has been off the medication for 2-3 weeks, but at this time denies any symptoms of psychosis.  I will fill today for one month, but explained to patient that this is not a medication I am comfortable managing and she needs to follow up with [facility].

## 2011-03-26 NOTE — Assessment & Plan Note (Signed)
Well controlled, no changes 

## 2011-03-26 NOTE — Assessment & Plan Note (Signed)
Patient has not had lipid panel in several years, will order.  

## 2011-03-26 NOTE — Assessment & Plan Note (Signed)
Continue to encourage patient, she is in contemplation stage right now.

## 2011-04-17 ENCOUNTER — Other Ambulatory Visit: Payer: Self-pay | Admitting: Family Medicine

## 2011-04-17 NOTE — Telephone Encounter (Signed)
Refill request

## 2011-05-14 ENCOUNTER — Other Ambulatory Visit: Payer: Self-pay | Admitting: Family Medicine

## 2011-05-14 NOTE — Telephone Encounter (Signed)
Refill request

## 2011-06-05 ENCOUNTER — Other Ambulatory Visit: Payer: Self-pay | Admitting: Family Medicine

## 2011-06-05 DIAGNOSIS — I10 Essential (primary) hypertension: Secondary | ICD-10-CM

## 2011-06-05 MED ORDER — LISINOPRIL-HYDROCHLOROTHIAZIDE 20-25 MG PO TABS: 1.0000 | ORAL_TABLET | Freq: Every day | ORAL | Status: DC

## 2011-07-21 ENCOUNTER — Other Ambulatory Visit: Payer: Self-pay | Admitting: Family Medicine

## 2011-07-21 DIAGNOSIS — E781 Pure hyperglyceridemia: Secondary | ICD-10-CM

## 2011-07-21 DIAGNOSIS — E785 Hyperlipidemia, unspecified: Secondary | ICD-10-CM

## 2011-07-21 MED ORDER — GEMFIBROZIL 600 MG PO TABS: 600.0000 mg | ORAL_TABLET | Freq: Two times a day (BID) | ORAL | Status: DC

## 2011-08-21 ENCOUNTER — Other Ambulatory Visit: Payer: PRIVATE HEALTH INSURANCE

## 2011-08-21 ENCOUNTER — Other Ambulatory Visit: Payer: Self-pay | Admitting: Family Medicine

## 2011-08-21 ENCOUNTER — Ambulatory Visit (INDEPENDENT_AMBULATORY_CARE_PROVIDER_SITE_OTHER): Payer: PRIVATE HEALTH INSURANCE

## 2011-08-21 DIAGNOSIS — E781 Pure hyperglyceridemia: Secondary | ICD-10-CM

## 2011-08-21 DIAGNOSIS — Z23 Encounter for immunization: Secondary | ICD-10-CM

## 2011-08-21 DIAGNOSIS — K219 Gastro-esophageal reflux disease without esophagitis: Secondary | ICD-10-CM

## 2011-08-21 LAB — LIPID PANEL
HDL: 45 mg/dL (ref >39)
LDL Cholesterol: 117 mg/dL — ABNORMAL HIGH (ref 0–99)
Total CHOL/HDL Ratio: 4.5 Ratio
VLDL: 39 mg/dL (ref 0–40)

## 2011-08-21 MED ORDER — OMEPRAZOLE 20 MG PO CPDR: 20.0000 mg | DELAYED_RELEASE_CAPSULE | Freq: Every day | ORAL | Status: DC

## 2011-08-21 NOTE — Progress Notes (Signed)
FLP DONE TODAY Shay Jhaveri 

## 2011-08-25 ENCOUNTER — Encounter: Payer: Self-pay | Admitting: Family Medicine

## 2011-08-25 ENCOUNTER — Other Ambulatory Visit: Payer: Self-pay | Admitting: Family Medicine

## 2011-08-25 ENCOUNTER — Telehealth: Payer: Self-pay | Admitting: Family Medicine

## 2011-08-25 MED ORDER — SIMVASTATIN 20 MG PO TABS: 20.0000 mg | ORAL_TABLET | Freq: Every day | ORAL | Status: DC

## 2011-08-25 NOTE — Telephone Encounter (Signed)
States that someone called to tell her to stop taking one of her meds.  Not sure which one.  pls advise

## 2011-08-25 NOTE — Telephone Encounter (Signed)
Did you call patient?

## 2011-08-27 NOTE — Telephone Encounter (Signed)
Pt is to start Simvastatin, not d/c any medications, please see letter.

## 2011-09-08 ENCOUNTER — Telehealth: Payer: Self-pay | Admitting: Family Medicine

## 2011-09-08 ENCOUNTER — Other Ambulatory Visit: Payer: Self-pay | Admitting: Family Medicine

## 2011-09-08 NOTE — Telephone Encounter (Signed)
Refill request

## 2011-09-08 NOTE — Telephone Encounter (Signed)
Patient states 2 days after starting simvastatin she developed cramping in sides  just above hips. She stopped simvastatin on 09/06/2011 and the cramping stopped. Consulted with Dr. [place] and she advises to stop simvastatin for now and follow up with MD for evaluation.[facility] appointment tomorrow but cannot come in until next week. Appointment scheduled 09/17/2011. Dr. [place] notified.

## 2011-09-08 NOTE — Telephone Encounter (Signed)
Pt says the medication she was recently prescribed for cholesterol is giving her cramps, wants to know if that is normal?

## 2011-09-15 ENCOUNTER — Telehealth: Payer: Self-pay | Admitting: Family Medicine

## 2011-09-15 NOTE — Telephone Encounter (Signed)
Krista [name] is calling to see what she should take in place of Simvastatin.  I let her know that based on the last Encounter, she would need to make an appointment to discuss this.  She did not want to do that just yet.  She would like Dr. Lula Olszewski to give her a call.

## 2011-09-17 ENCOUNTER — Ambulatory Visit: Payer: PRIVATE HEALTH INSURANCE | Admitting: Family Medicine

## 2011-09-18 NOTE — Telephone Encounter (Signed)
Called Krista [name] back, simvastatin made her muscles sore, so she stopped taking it.  Reviewed her lipid profile:  Lab Results  Component Value Date   CHOL 201* 08/21/2011   HDL 45 08/21/2011   LDLCALC 117* 08/21/2011   LDLDIRECT 103* 12/20/2009   TRIG 197* 08/21/2011   CHOLHDL 4.5 08/21/2011   Discussed with patient that it was only a little high, advised discontinuing statin and continuing lopid and niacin, and lifestyle changes.  She agrees.

## 2011-12-11 ENCOUNTER — Other Ambulatory Visit (INDEPENDENT_AMBULATORY_CARE_PROVIDER_SITE_OTHER): Payer: PRIVATE HEALTH INSURANCE | Admitting: Family Medicine

## 2011-12-11 DIAGNOSIS — E1139 Type 2 diabetes mellitus with other diabetic ophthalmic complication: Secondary | ICD-10-CM

## 2011-12-11 DIAGNOSIS — H579 Unspecified disorder of eye and adnexa: Secondary | ICD-10-CM

## 2011-12-11 NOTE — Telephone Encounter (Signed)
Needs a refill on Neurontin and Rx for Diabetic shoes sent to [facility].

## 2011-12-15 MED ORDER — GABAPENTIN 300 MG PO CAPS: 300.0000 mg | ORAL_CAPSULE | Freq: Three times a day (TID) | ORAL | Status: DC

## 2011-12-15 NOTE — Telephone Encounter (Signed)
Rx sent to [facility].

## 2012-01-07 ENCOUNTER — Other Ambulatory Visit: Payer: Self-pay | Admitting: Family Medicine

## 2012-01-07 NOTE — Telephone Encounter (Signed)
Refill request

## 2012-01-07 NOTE — Telephone Encounter (Signed)
Refill request-Dr. Chamberlain on vacation 

## 2012-01-12 ENCOUNTER — Other Ambulatory Visit: Payer: Self-pay | Admitting: Family Medicine

## 2012-01-12 NOTE — Telephone Encounter (Signed)
Refill request

## 2012-01-13 ENCOUNTER — Ambulatory Visit: Payer: PRIVATE HEALTH INSURANCE | Admitting: Family Medicine

## 2012-02-02 ENCOUNTER — Other Ambulatory Visit: Payer: Self-pay | Admitting: Family Medicine

## 2012-02-02 DIAGNOSIS — E119 Type 2 diabetes mellitus without complications: Secondary | ICD-10-CM

## 2012-02-02 DIAGNOSIS — E1139 Type 2 diabetes mellitus with other diabetic ophthalmic complication: Secondary | ICD-10-CM

## 2012-02-02 MED ORDER — METFORMIN HCL 500 MG PO TABS: 500.0000 mg | ORAL_TABLET | Freq: Two times a day (BID) | ORAL | Status: DC

## 2012-02-10 ENCOUNTER — Telehealth: Payer: Self-pay | Admitting: Family Medicine

## 2012-02-10 DIAGNOSIS — N3941 Urge incontinence: Secondary | ICD-10-CM

## 2012-02-10 DIAGNOSIS — I10 Essential (primary) hypertension: Secondary | ICD-10-CM

## 2012-02-10 DIAGNOSIS — E119 Type 2 diabetes mellitus without complications: Secondary | ICD-10-CM

## 2012-02-10 DIAGNOSIS — E1139 Type 2 diabetes mellitus with other diabetic ophthalmic complication: Secondary | ICD-10-CM

## 2012-02-10 MED ORDER — OMEPRAZOLE 40 MG PO CPDR: 40.0000 mg | DELAYED_RELEASE_CAPSULE | Freq: Every day | ORAL | Status: DC

## 2012-02-10 MED ORDER — TRAMADOL HCL 50 MG PO TABS: 50.0000 mg | ORAL_TABLET | Freq: Three times a day (TID) | ORAL | Status: DC | PRN

## 2012-02-10 MED ORDER — UNILET EXCELITE II MISC: Status: DC

## 2012-02-10 MED ORDER — METFORMIN HCL 500 MG PO TABS: 500.0000 mg | ORAL_TABLET | Freq: Two times a day (BID) | ORAL | Status: DC

## 2012-02-10 MED ORDER — BENZTROPINE MESYLATE 1 MG PO TABS: 1.0000 mg | ORAL_TABLET | Freq: Every day | ORAL | Status: DC

## 2012-02-10 MED ORDER — FLUTICASONE PROPIONATE 50 MCG/ACT NA SUSP: 1.0000 | Freq: Every day | NASAL | Status: DC

## 2012-02-10 MED ORDER — SIMVASTATIN 20 MG PO TABS: 20.0000 mg | ORAL_TABLET | Freq: Every day | ORAL | Status: DC

## 2012-02-10 MED ORDER — LISINOPRIL-HYDROCHLOROTHIAZIDE 20-25 MG PO TABS: 1.0000 | ORAL_TABLET | Freq: Every day | ORAL | Status: DC

## 2012-02-10 MED ORDER — ASPIRIN 81 MG PO TBEC: 81.0000 mg | DELAYED_RELEASE_TABLET | Freq: Every day | ORAL | Status: DC

## 2012-02-10 MED ORDER — NIACIN ER (ANTIHYPERLIPIDEMIC) 750 MG PO TBCR: 750.0000 mg | EXTENDED_RELEASE_TABLET | Freq: Every day | ORAL | Status: DC

## 2012-02-10 MED ORDER — GABAPENTIN 300 MG PO CAPS: 300.0000 mg | ORAL_CAPSULE | Freq: Three times a day (TID) | ORAL | Status: DC

## 2012-02-10 MED ORDER — OXYBUTYNIN CHLORIDE 5 MG PO TABS: 5.0000 mg | ORAL_TABLET | Freq: Two times a day (BID) | ORAL | Status: DC

## 2012-02-10 MED ORDER — GLUCOSE BLOOD VI STRP: ORAL_STRIP | Status: DC

## 2012-02-10 MED ORDER — GEMFIBROZIL 600 MG PO TABS: 600.0000 mg | ORAL_TABLET | Freq: Two times a day (BID) | ORAL | Status: DC

## 2012-02-10 NOTE — Telephone Encounter (Signed)
All long term medications sent with 90 day supplies, with a year's worth of Rx.

## 2012-02-10 NOTE — Telephone Encounter (Signed)
Patient is calling because she is changing her Pharmacy to [facility] [phone].  She needs new Rx's for all of her meds to go to this pharmacy.

## 2012-02-12 ENCOUNTER — Encounter: Payer: Self-pay | Admitting: Family Medicine

## 2012-02-12 ENCOUNTER — Ambulatory Visit (INDEPENDENT_AMBULATORY_CARE_PROVIDER_SITE_OTHER): Payer: PRIVATE HEALTH INSURANCE | Admitting: Family Medicine

## 2012-02-12 VITALS — BP 145/88 | HR 99 | Temp 98.7°F | Ht 64.5 in | Wt 211.0 lb

## 2012-02-12 DIAGNOSIS — E1139 Type 2 diabetes mellitus with other diabetic ophthalmic complication: Secondary | ICD-10-CM

## 2012-02-12 DIAGNOSIS — E669 Obesity, unspecified: Secondary | ICD-10-CM

## 2012-02-12 DIAGNOSIS — R062 Wheezing: Secondary | ICD-10-CM

## 2012-02-12 DIAGNOSIS — H579 Unspecified disorder of eye and adnexa: Secondary | ICD-10-CM

## 2012-02-12 HISTORY — DX: Wheezing: R06.2

## 2012-02-12 MED ORDER — DICLOFENAC SODIUM 1 % TD GEL: 1.0000 "application " | Freq: Four times a day (QID) | TRANSDERMAL | Status: DC

## 2012-02-12 MED ORDER — ALBUTEROL SULFATE HFA 108 (90 BASE) MCG/ACT IN AERS: 2.0000 | INHALATION_SPRAY | Freq: Four times a day (QID) | RESPIRATORY_TRACT | Status: DC | PRN

## 2012-02-12 NOTE — Progress Notes (Signed)
  Subjective:    Patient ID: Krista [name], female    DOB: 09/22/1956, 56 y.o.   MRN: [id number]  HPI  Krista [name] comes in for some intermittent wheezing and shortness of breath.  She says it is usually when she is outside and has been going on for a few weeks since the weather turned nice.  She has been trying to walk more outside.  She denies fevers/chills/productive cough. She is having a stuffy nose.   DM- taking metformin takes a pill in am and pm, sometimes in middle of day if blood sugar is too high, checks CGB's 2-3 x/day because that helps her decide how much metformin to take.   HTN- has been taking tylenol cold for nasal symptoms.  Taking blood pressure medications every day.  No dizziness, palpitations, chest pain.   Obesity- Krista [name] has been trying to walk more for exercise.  She says her knees hurt (but Voltaren gel helps), and she is happy that she has lost a few pounds.    Review of Systems Pertinent items in HPI.     Objective:   Physical Exam BP 145/88  Pulse 99  Temp(Src) 98.7 F (37.1 C) (Oral)  Ht 5' 4.5" (1.638 m)  Wt 211 lb (95.709 kg)  BMI 35.66 kg/m2 General appearance: alert, cooperative and no distress Ears: normal TM's and external ear canals both ears Nose: Nares normal. Septum midline. Mucosa normal. No drainage or sinus tenderness. Throat: lips, mucosa, and tongue normal; teeth and gums normal Lungs: clear to auscultation bilaterally Heart: regular rate and rhythm, S1, S2 normal, no murmur, click, rub or gallop Abdomen: soft, non-tender; bowel sounds normal; no masses,  no organomegaly Extremities: extremities normal, atraumatic, no cyanosis or edema Pulses: 2+ and symmetric       Assessment & Plan:

## 2012-02-12 NOTE — Assessment & Plan Note (Signed)
No wheezing on exam today, but she may wheeze when outside/at night time.  Will rx albuterol inhaler as needed.

## 2012-02-12 NOTE — Assessment & Plan Note (Signed)
A1C at goal, continue metformin.  

## 2012-02-12 NOTE — Assessment & Plan Note (Signed)
Patient exercising more, not made significant diet changes, will continue to encourage her.

## 2012-02-12 NOTE — Patient Instructions (Signed)
It was good to see you today.  Your Hemoglobin A1C is  Lab Results  Component Value Date   HGBA1C 6.4 02/12/2012  .  Remember your goal for A1C is less than 7.  Your goal for fasting morning blood sugar is 80-120.   Your blood pressure today was BP: 145/88 mmHg.  Remember your goal blood pressure is about 120/80.  Please be sure to take your medication every day.    Please continue to exercise and try to eat low fat foods to help with your cholesterol.

## 2012-02-13 ENCOUNTER — Telehealth: Payer: Self-pay | Admitting: Family Medicine

## 2012-02-13 DIAGNOSIS — I10 Essential (primary) hypertension: Secondary | ICD-10-CM

## 2012-02-13 DIAGNOSIS — N3941 Urge incontinence: Secondary | ICD-10-CM

## 2012-02-13 DIAGNOSIS — E1139 Type 2 diabetes mellitus with other diabetic ophthalmic complication: Secondary | ICD-10-CM

## 2012-02-13 DIAGNOSIS — E119 Type 2 diabetes mellitus without complications: Secondary | ICD-10-CM

## 2012-02-13 MED ORDER — FLUTICASONE PROPIONATE 50 MCG/ACT NA SUSP: 1.0000 | Freq: Every day | NASAL | Status: DC

## 2012-02-13 MED ORDER — UNILET EXCELITE II MISC: Status: DC

## 2012-02-13 MED ORDER — LISINOPRIL-HYDROCHLOROTHIAZIDE 20-25 MG PO TABS: 1.0000 | ORAL_TABLET | Freq: Every day | ORAL | Status: DC

## 2012-02-13 MED ORDER — OXYBUTYNIN CHLORIDE 5 MG PO TABS: 5.0000 mg | ORAL_TABLET | Freq: Two times a day (BID) | ORAL | Status: DC

## 2012-02-13 MED ORDER — GEMFIBROZIL 600 MG PO TABS: 600.0000 mg | ORAL_TABLET | Freq: Two times a day (BID) | ORAL | Status: DC

## 2012-02-13 MED ORDER — BENZTROPINE MESYLATE 1 MG PO TABS: 1.0000 mg | ORAL_TABLET | Freq: Every day | ORAL | Status: DC

## 2012-02-13 MED ORDER — ASPIRIN 81 MG PO TBEC: 81.0000 mg | DELAYED_RELEASE_TABLET | Freq: Every day | ORAL | Status: DC

## 2012-02-13 MED ORDER — OMEPRAZOLE 40 MG PO CPDR: 40.0000 mg | DELAYED_RELEASE_CAPSULE | Freq: Every day | ORAL | Status: DC

## 2012-02-13 MED ORDER — NIACIN ER (ANTIHYPERLIPIDEMIC) 750 MG PO TBCR: 750.0000 mg | EXTENDED_RELEASE_TABLET | Freq: Every day | ORAL | Status: DC

## 2012-02-13 MED ORDER — SIMVASTATIN 20 MG PO TABS: 20.0000 mg | ORAL_TABLET | Freq: Every day | ORAL | Status: DC

## 2012-02-13 MED ORDER — DICLOFENAC SODIUM 1 % TD GEL: 1.0000 "application " | Freq: Four times a day (QID) | TRANSDERMAL | Status: DC

## 2012-02-13 MED ORDER — TRAMADOL HCL 50 MG PO TABS: 50.0000 mg | ORAL_TABLET | Freq: Three times a day (TID) | ORAL | Status: DC | PRN

## 2012-02-13 MED ORDER — METFORMIN HCL 500 MG PO TABS: 500.0000 mg | ORAL_TABLET | Freq: Two times a day (BID) | ORAL | Status: DC

## 2012-02-13 MED ORDER — GLUCOSE BLOOD VI STRP: ORAL_STRIP | Status: DC

## 2012-02-13 MED ORDER — GABAPENTIN 300 MG PO CAPS: 300.0000 mg | ORAL_CAPSULE | Freq: Three times a day (TID) | ORAL | Status: DC

## 2012-02-13 MED ORDER — ALBUTEROL SULFATE HFA 108 (90 BASE) MCG/ACT IN AERS: 2.0000 | INHALATION_SPRAY | Freq: Four times a day (QID) | RESPIRATORY_TRACT | Status: DC | PRN

## 2012-02-13 NOTE — Telephone Encounter (Signed)
Will forward message to Dr. Chamberlain 

## 2012-02-13 NOTE — Telephone Encounter (Signed)
All chronic medications Rx sent to [facility], 90 days at a time, year's supply.

## 2012-02-13 NOTE — Telephone Encounter (Signed)
Pt states that all the meds that were sent yesterday in to [place] [facility] needs to be changed to [facility].  They had sent her a letter stating that she is not covered under them ([facility] [facility]) anymore

## 2012-02-16 ENCOUNTER — Other Ambulatory Visit: Payer: Self-pay | Admitting: Family Medicine

## 2012-02-16 DIAGNOSIS — N3941 Urge incontinence: Secondary | ICD-10-CM

## 2012-02-16 DIAGNOSIS — E1139 Type 2 diabetes mellitus with other diabetic ophthalmic complication: Secondary | ICD-10-CM

## 2012-02-16 DIAGNOSIS — E119 Type 2 diabetes mellitus without complications: Secondary | ICD-10-CM

## 2012-02-16 MED ORDER — METFORMIN HCL 500 MG PO TABS: 500.0000 mg | ORAL_TABLET | Freq: Two times a day (BID) | ORAL | Status: DC

## 2012-02-16 MED ORDER — OXYBUTYNIN CHLORIDE 5 MG PO TABS: 5.0000 mg | ORAL_TABLET | Freq: Two times a day (BID) | ORAL | Status: DC

## 2012-02-16 MED ORDER — IBUPROFEN 800 MG PO TABS: 400.0000 mg | ORAL_TABLET | Freq: Three times a day (TID) | ORAL | Status: DC | PRN

## 2012-03-09 ENCOUNTER — Telehealth: Payer: Self-pay | Admitting: Family Medicine

## 2012-03-09 NOTE — Telephone Encounter (Signed)
Pt is requesting rx for 800 ibuprofen instead of using the tramadol.  Tramadol causes patient to have seizures and bad headaches.  Need ibuprofen sent to [facility] for dosage to be taken 3 times daily.

## 2012-03-11 MED ORDER — IBUPROFEN 800 MG PO TABS: 400.0000 mg | ORAL_TABLET | Freq: Three times a day (TID) | ORAL | Status: DC | PRN

## 2012-03-11 NOTE — Telephone Encounter (Signed)
Rx sent to pharmacy   

## 2012-04-04 ENCOUNTER — Other Ambulatory Visit: Payer: Self-pay | Admitting: Family Medicine

## 2012-04-05 MED ORDER — IBUPROFEN 800 MG PO TABS: 800.0000 mg | ORAL_TABLET | Freq: Two times a day (BID) | ORAL | Status: DC | PRN

## 2012-04-05 NOTE — Telephone Encounter (Signed)
Called patient to discuss chronic NSAID use as I received a refill request. She is taking motrin 2-3 times per day and neurontin. It has been a year since Cr checked. I advised her to come in for f/u and blood work. She already had plans to make an appt for herself and her mom. She request a refill for motrin 800 mg BID.

## 2012-04-14 ENCOUNTER — Telehealth: Payer: Self-pay | Admitting: Family Medicine

## 2012-04-14 NOTE — Telephone Encounter (Signed)
Patient is calling because of the pain she is feeling and needs to take Ibuprofen at least 3 times per day instead of 2 and she needs a refill to be sent to [facility].

## 2012-04-15 NOTE — Telephone Encounter (Signed)
Tried to return call, phone rang many times, but no answering machine.

## 2012-04-15 NOTE — Telephone Encounter (Signed)
Tried calling patient again, no answer again.

## 2012-04-19 ENCOUNTER — Telehealth: Payer: Self-pay | Admitting: Family Medicine

## 2012-04-19 NOTE — Telephone Encounter (Signed)
Patient is calling about her Niaspan medication.  She said the dosage was changed and she doesn't understand why.  She also needs Dr. Lula Olszewski to contact [facility] for clarification on this medication.

## 2012-04-20 MED ORDER — NIACIN ER (ANTIHYPERLIPIDEMIC) 750 MG PO TBCR: 1500.0000 mg | EXTENDED_RELEASE_TABLET | Freq: Every day | ORAL | Status: DC

## 2012-04-20 NOTE — Telephone Encounter (Signed)
Called patient- Rx accidentally changed.  Refill for 1500 mg Niaspan sent to her mail order pharmacy.

## 2012-04-23 ENCOUNTER — Other Ambulatory Visit: Payer: PRIVATE HEALTH INSURANCE

## 2012-04-27 ENCOUNTER — Other Ambulatory Visit: Payer: Self-pay | Admitting: Family Medicine

## 2012-05-03 ENCOUNTER — Other Ambulatory Visit: Payer: Self-pay | Admitting: *Deleted

## 2012-05-31 ENCOUNTER — Other Ambulatory Visit: Payer: Self-pay | Admitting: Family Medicine

## 2012-05-31 MED ORDER — GLUCOSE BLOOD VI STRP: ORAL_STRIP | Status: DC

## 2012-05-31 MED ORDER — UNILET EXCELITE II MISC: Status: DC

## 2012-05-31 NOTE — Telephone Encounter (Signed)
Rx sent electronically.  

## 2012-05-31 NOTE — Telephone Encounter (Signed)
Patient is calling because she needs a refill on her Test Strips for [employer] to go to [facility].  That fax # is [phone] and the phone # is [phone].

## 2012-06-04 ENCOUNTER — Telehealth: Payer: Self-pay | Admitting: Family Medicine

## 2012-06-04 MED ORDER — CYCLOBENZAPRINE HCL 5 MG PO TABS: 5.0000 mg | ORAL_TABLET | Freq: Two times a day (BID) | ORAL | Status: DC | PRN

## 2012-06-04 MED ORDER — IBUPROFEN 800 MG PO TABS: 800.0000 mg | ORAL_TABLET | Freq: Two times a day (BID) | ORAL | Status: DC | PRN

## 2012-06-04 NOTE — Telephone Encounter (Signed)
Rx for about two weeks worth of medication called in [facility].  I have told her many times that neither of these medications are long-term medicines that she can take every day. Please let her know they [facility].

## 2012-06-04 NOTE — Telephone Encounter (Signed)
Patient is calling for muscle relaxers and her ibuprofen to be sent to her pharmacy.  She strained her back while lifting Kitty Litter and other heavy objects.  She cannot come in today because she needs to go to the bank, but the is in a lot of pain.

## 2012-06-07 ENCOUNTER — Encounter (HOSPITAL_COMMUNITY): Payer: Self-pay | Admitting: *Deleted

## 2012-06-07 ENCOUNTER — Emergency Department (HOSPITAL_COMMUNITY)
Admission: EM | Admit: 2012-06-07 | Discharge: 2012-06-07 | Disposition: A | Discharge status: Home / Self Care | Payer: Medicare Other | Attending: Emergency Medicine | Admitting: Emergency Medicine

## 2012-06-07 DIAGNOSIS — S99922A Unspecified injury of left foot, initial encounter: Secondary | ICD-10-CM

## 2012-06-07 DIAGNOSIS — I10 Essential (primary) hypertension: Secondary | ICD-10-CM | POA: Insufficient documentation

## 2012-06-07 DIAGNOSIS — M545 Low back pain, unspecified: Secondary | ICD-10-CM | POA: Insufficient documentation

## 2012-06-07 DIAGNOSIS — S8990XA Unspecified injury of unspecified lower leg, initial encounter: Secondary | ICD-10-CM | POA: Insufficient documentation

## 2012-06-07 DIAGNOSIS — F17200 Nicotine dependence, unspecified, uncomplicated: Secondary | ICD-10-CM | POA: Insufficient documentation

## 2012-06-07 DIAGNOSIS — S99929A Unspecified injury of unspecified foot, initial encounter: Secondary | ICD-10-CM | POA: Insufficient documentation

## 2012-06-07 DIAGNOSIS — E119 Type 2 diabetes mellitus without complications: Secondary | ICD-10-CM | POA: Insufficient documentation

## 2012-06-07 DIAGNOSIS — X500XXA Overexertion from strenuous movement or load, initial encounter: Secondary | ICD-10-CM | POA: Insufficient documentation

## 2012-06-07 DIAGNOSIS — IMO0002 Reserved for concepts with insufficient information to code with codable children: Secondary | ICD-10-CM | POA: Insufficient documentation

## 2012-06-07 DIAGNOSIS — K219 Gastro-esophageal reflux disease without esophagitis: Secondary | ICD-10-CM | POA: Insufficient documentation

## 2012-06-07 DIAGNOSIS — E785 Hyperlipidemia, unspecified: Secondary | ICD-10-CM | POA: Insufficient documentation

## 2012-06-07 DIAGNOSIS — Z79899 Other long term (current) drug therapy: Secondary | ICD-10-CM | POA: Insufficient documentation

## 2012-06-07 DIAGNOSIS — Z885 Allergy status to narcotic agent status: Secondary | ICD-10-CM | POA: Insufficient documentation

## 2012-06-07 DIAGNOSIS — Z7982 Long term (current) use of aspirin: Secondary | ICD-10-CM | POA: Insufficient documentation

## 2012-06-07 MED ORDER — CYCLOBENZAPRINE HCL 10 MG PO TABS: 5.0000 mg | ORAL_TABLET | Freq: Two times a day (BID) | ORAL | Status: AC | PRN

## 2012-06-07 MED ORDER — IBUPROFEN 800 MG PO TABS: 800.0000 mg | ORAL_TABLET | Freq: Three times a day (TID) | ORAL | Status: DC

## 2012-06-07 NOTE — ED Notes (Signed)
C/o back pain, h/o same, r/t lifting cat food bags, denies sx other than pain, (denies: nv, abd pain, vaginal or urinary sx or fever), has been taking ibuprofen and flexeril. Onset 1 week ago. Also stubbed L 4th toe last night "and it is purple".

## 2012-06-07 NOTE — ED Provider Notes (Signed)
History     CSN: [id number]  Arrival date & time 06/07/12  0627   First MD Initiated Contact with Patient 06/07/12 [phone]      Chief Complaint  Patient presents with  . Back Pain    (Consider location/radiation/quality/duration/timing/severity/associated sxs/prior treatment) HPI Comments: Krista [name] 56 y.o. female   The chief complaint is: Patient presents with:   Back Pain   The patient has medical history significant for:   Past Medical History:   Arthritis                                                    Diabetes mellitus                                            Hyperlipidemia                                               Hypertension                                                 Allergy                                                      GERD (gastroesophageal reflux disease)                      Patient presents for exacerbation of chronic back pain. Patient states that one week ago she hurt her back while lifting a 25 lb bag of cat food. She states that she used proper body mechanics during the lift and did not sustain a fall. She states that she has a history of chronic back pain for which she takes 3-4 800 mg Ibuprofen and Flexeril. On this incident that pain combination did not help. She states that the pain is at the lowest part of her back and rates it 8-9/10. Pain is better with rest and worst with walking, thus impairing her ability to do chores. Patient states that also recently "stubbed" her left 4th digit and believes it to be broken. Denies fever, chills, NS. Denies NVD. Denies difficulty ambulating Denies bowel or bladder incontinence. Denies past medical history of malignancy.      Patient is a 56 y.o. female presenting with back pain.  Back Pain  Associated symptoms include numbness. Pertinent negatives include no fever, no dysuria and no weakness.    Past Medical History  Diagnosis Date  . Arthritis   . Diabetes mellitus   .  Hyperlipidemia   . Hypertension   . Allergy   . GERD (gastroesophageal reflux disease)     History reviewed. No pertinent past surgical history.  Family History  Problem Relation Age of Onset  . Diabetes Mother   . COPD Mother   .  Heart disease Mother   . Hyperlipidemia Mother   . Hypertension Mother   . Arthritis Mother     History  Substance Use Topics  . Smoking status: Current Everyday Smoker -- 1.0 packs/day    Types: Cigarettes  . Smokeless tobacco: Not on file  . Alcohol Use: No    OB History    Grav Para Term Preterm Abortions TAB SAB Ect Mult Living                  Review of Systems  Constitutional: Positive for activity change. Negative for fever, chills and fatigue.  Respiratory: Negative for shortness of breath.   Gastrointestinal: Negative for nausea, vomiting and diarrhea.       Negative for bowel or bladder incontinence.  Genitourinary: Negative for dysuria, urgency and flank pain.  Musculoskeletal: Positive for back pain.  Neurological: Positive for numbness. Negative for weakness.       Numbness of the left 4th digit.    Allergies  Codeine  Home Medications   Current Outpatient Rx  Name Route Sig Dispense Refill  . ALBUTEROL SULFATE HFA 108 (90 BASE) MCG/ACT IN AERS Inhalation Inhale 2 puffs into the lungs every 6 (six) hours as needed for wheezing. 2 Inhaler 5  . ASPIRIN 81 MG PO TBEC Oral Take 1 tablet (81 mg total) by mouth daily. 90 tablet 3  . BENZTROPINE MESYLATE 1 MG PO TABS Oral Take 1 tablet (1 mg total) by mouth at bedtime. 90 tablet 3  . CYCLOBENZAPRINE HCL 5 MG PO TABS Oral Take 1 tablet (5 mg total) by mouth 2 (two) times daily as needed for muscle spasms. 30 tablet 0  . DICLOFENAC SODIUM 1 % TD GEL Topical Apply 1 application topically 4 (four) times daily. Apply 4 g two times a day  to qid as needed . 100 g 11  . FLUPHENAZINE DECANOATE 25 MG/ML IJ SOLN  No instruction given     . FLUTICASONE PROPIONATE 50 MCG/ACT NA SUSP Nasal  Place 1 spray into the nose daily. 16 g 11  . DIABETIC INSOLES MISC  Use as directed. Dispense 3 pair (DUABETUC SHOE INSERTS)     . GABAPENTIN 300 MG PO CAPS Oral Take 1 capsule (300 mg total) by mouth 3 (three) times daily. 270 capsule 3  . GEMFIBROZIL 600 MG PO TABS Oral Take 1 tablet (600 mg total) by mouth 2 (two) times daily. 180 tablet 3  . GLUCOSE BLOOD VI STRP  Test blood sugars one time daily 100 each 11  . IBUPROFEN 800 MG PO TABS Oral Take 1 tablet (800 mg total) by mouth 2 (two) times daily as needed for pain. 30 tablet 0    Please send all refill request electronically.  Marland Kitchen UNILET EXCELITE II MISC  Use 3 to 4 times daily 100 each 11  . LISINOPRIL-HYDROCHLOROTHIAZIDE 20-25 MG PO TABS Oral Take 1 tablet by mouth daily. 90 tablet 3  . METFORMIN HCL 500 MG PO TABS Oral Take 1 tablet (500 mg total) by mouth 2 (two) times daily. 2 tabs by mouth qam and 1 tab by mouth qhs 90 tablet 5    Please send all refill request electronically.  Marland Kitchen NIACIN ER (ANTIHYPERLIPIDEMIC) 750 MG PO TBCR Oral Take 2 tablets (1,500 mg total) by mouth at bedtime. 180 tablet 3  . OMEPRAZOLE 40 MG PO CPDR Oral Take 1 capsule (40 mg total) by mouth daily. 90 capsule 3  . OXYBUTYNIN CHLORIDE 5 MG PO TABS Oral  Take 1 tablet (5 mg total) by mouth 2 (two) times daily. 180 tablet 3    Please send all refill request electronically.  Marland Kitchen SIMVASTATIN 20 MG PO TABS Oral Take 1 tablet (20 mg total) by mouth at bedtime. 90 tablet 3  . TRAMADOL HCL 50 MG PO TABS Oral Take 1 tablet (50 mg total) by mouth every 8 (eight) hours as needed for pain. 270 tablet 3    BP 145/92  Pulse 88  Temp 97.6 F (36.4 C) (Oral)  Resp 16  SpO2 99%  Physical Exam  Nursing note and vitals reviewed. Constitutional: She appears well-developed and well-nourished.  HENT:  Head: Normocephalic and atraumatic.  Mouth/Throat: Oropharynx is clear and moist.  Eyes: No scleral icterus.  Neck: Normal range of motion. Neck supple.  Cardiovascular: Normal  rate, regular rhythm and normal heart sounds.   Pulmonary/Chest: Effort normal. She has wheezes.  Abdominal: Soft. Bowel sounds are normal. There is no tenderness.  Musculoskeletal: Normal range of motion. She exhibits tenderness. She exhibits no edema.       Left ankle: tenderness.       Lumbar back: She exhibits tenderness, bony tenderness and pain. She exhibits normal range of motion, no swelling, no edema, no deformity, no laceration, no spasm and normal pulse.       Back:       Feet:       Patient has point tenderness at the sacrum.  Neurological: She is alert.  Skin: Skin is warm and dry.    ED Course  Procedures (including critical care time)  Labs Reviewed - No data to display No results found.   1. Back pain, lumbosacral   2. Injury of toe on left foot       MDM  Patient presents with history of chronic back pain and lumbosacral strain s/p lifting a 25lb bag of cat food last week. Denies bowel or bladder incontinence, fever, numbness, or difficulty ambulating. Denies dysuria or urgency. Patient instructed on not taking more than three 800 mg ibuprofen per day. Also instructed to take Tylenol 650 mg 4 times daily in addition to her current regimen. Patient has no red flags for Cauda equina, epidural abscess or other more serious causes of back pain. Patient given return precautions verbally and in discharge summary.        Pixie Casino, PA-C 06/07/12 [phone]

## 2012-06-09 ENCOUNTER — Other Ambulatory Visit: Payer: Self-pay | Admitting: Family Medicine

## 2012-06-10 ENCOUNTER — Telehealth: Payer: Self-pay | Admitting: Family Medicine

## 2012-06-10 NOTE — ED Provider Notes (Signed)
Medical screening examination/treatment/procedure(s) were performed by non-physician practitioner and as supervising physician I was immediately available for consultation/collaboration.   Gwyneth Sprout, MD 06/10/12 [phone]

## 2012-06-10 NOTE — Telephone Encounter (Signed)
Pt switched pharmacies for her diabetic testing strips since she has a different insurance, pt needs [place] to send a new rx to  [facility] [phone], says she is testing twice daily.

## 2012-06-14 ENCOUNTER — Ambulatory Visit: Payer: PRIVATE HEALTH INSURANCE | Admitting: Family Medicine

## 2012-06-16 NOTE — Telephone Encounter (Signed)
I cannot find a pharmacy called [facility] in our data base.  I also do not know what type of diabetic testing supplies patient uses.  I need to know the brand name of her test strips, and the name of the pharmacy, or she can ask the pharmacy to send me a request electronically, I prefer no faxes.  Please notify her.

## 2012-06-16 NOTE — Telephone Encounter (Signed)
Wants to know why she hasn't heard anything yet about her testing supplies

## 2012-06-17 NOTE — Telephone Encounter (Signed)
Spoke with patient and she uses [employer] supplies. States she only wants to use [facility] for her diabetic supplies. Called [facility], the do not do electronic requests only faxes. They will fax a new refill request for patient, Item #3 needs to say that she tests twice a day.

## 2012-06-24 ENCOUNTER — Telehealth: Payer: Self-pay | Admitting: Family Medicine

## 2012-06-24 MED ORDER — IBUPROFEN 800 MG PO TABS: 800.0000 mg | ORAL_TABLET | Freq: Three times a day (TID) | ORAL | Status: DC | PRN

## 2012-06-24 NOTE — Telephone Encounter (Signed)
Refill sent in for 90. Pharmacy may be reluctant to fill due to high dose. If so patient my buy OTC and take equivalent dose. If she is still having pain w/o improvement she should make f/u appt with PCP.

## 2012-06-24 NOTE — Telephone Encounter (Signed)
Patient notified

## 2012-06-24 NOTE — Telephone Encounter (Signed)
Spoke with patient and what she actually wants is a larger quanity called in for ibuprofen to last her a month . Wants #90 sent in.  States she has not gotten the RX for # 30 sent in on 08/07 yet. Was waiting to see if MD would send in more at the time. Advised will send message to MD  covering for Dr. Lula Olszewski

## 2012-06-24 NOTE — Telephone Encounter (Signed)
Pt states that her back still hurts and wants to know if her Ibuprofen 800 can be called in - she is now out and pharm won't fill anymore.

## 2012-07-30 ENCOUNTER — Ambulatory Visit (INDEPENDENT_AMBULATORY_CARE_PROVIDER_SITE_OTHER): Payer: Medicare Other | Admitting: Family Medicine

## 2012-07-30 ENCOUNTER — Encounter: Payer: Self-pay | Admitting: Family Medicine

## 2012-07-30 VITALS — BP 139/89 | HR 94 | Temp 98.3°F | Ht 64.5 in | Wt 207.0 lb

## 2012-07-30 DIAGNOSIS — I10 Essential (primary) hypertension: Secondary | ICD-10-CM

## 2012-07-30 DIAGNOSIS — Z23 Encounter for immunization: Secondary | ICD-10-CM

## 2012-07-30 DIAGNOSIS — H579 Unspecified disorder of eye and adnexa: Secondary | ICD-10-CM

## 2012-07-30 DIAGNOSIS — R300 Dysuria: Secondary | ICD-10-CM

## 2012-07-30 DIAGNOSIS — E1139 Type 2 diabetes mellitus with other diabetic ophthalmic complication: Secondary | ICD-10-CM

## 2012-07-30 DIAGNOSIS — E785 Hyperlipidemia, unspecified: Secondary | ICD-10-CM

## 2012-07-30 LAB — POCT URINALYSIS DIPSTICK
Bilirubin, UA: NEGATIVE
Glucose, UA: NEGATIVE
Nitrite, UA: POSITIVE
Spec Grav, UA: 1.01
Urobilinogen, UA: 0.2

## 2012-07-30 LAB — POCT UA - MICROSCOPIC ONLY

## 2012-07-30 LAB — POCT GLYCOSYLATED HEMOGLOBIN (HGB A1C): Hemoglobin A1C: 6.6

## 2012-07-30 MED ORDER — GEMFIBROZIL 600 MG PO TABS: 600.0000 mg | ORAL_TABLET | Freq: Two times a day (BID) | ORAL | Status: DC

## 2012-07-30 MED ORDER — METFORMIN HCL 500 MG PO TABS: 500.0000 mg | ORAL_TABLET | Freq: Two times a day (BID) | ORAL | Status: DC

## 2012-07-30 MED ORDER — METFORMIN HCL 500 MG PO TABS: ORAL_TABLET | ORAL | Status: DC

## 2012-07-30 MED ORDER — CEPHALEXIN 500 MG PO CAPS: 500.0000 mg | ORAL_CAPSULE | Freq: Two times a day (BID) | ORAL | Status: DC

## 2012-07-30 MED ORDER — KNEE SLEEVE/KIDS LARGE MISC: 1.0000 | Freq: Once | Status: DC

## 2012-07-30 MED ORDER — LISINOPRIL-HYDROCHLOROTHIAZIDE 20-25 MG PO TABS: 1.0000 | ORAL_TABLET | Freq: Every day | ORAL | Status: DC

## 2012-07-30 NOTE — Patient Instructions (Signed)
It was good to see you, please continue to try to be as active as possible- I suggest icing your knee and ankle 20 minutes after activity.   Please get your mammogram and your eye exam.  Please make a lab appointment for on or after October 18th- please do not eat or drink anything before that appointment for 8 hours (except water).

## 2012-07-30 NOTE — Progress Notes (Signed)
  Subjective:    Patient ID: Krista [name], female    DOB: 11-27-1955, 56 y.o.   MRN: [id number]  HPI  Naara comes in for follow up.  She is doing fairly well.   Dysuria- is having discomfort with urination, this has been going on about a week, no fevers/chills/nausea/vomiting.   DM- taking metformin without side effects, checks blood sugars, fasting sugars are around 100.  She has not gotten an eye exam in more than a year.  No hypo or hyperglycemic episodes.   Tobacco abuse- has cut back, is now only smoking about 1/2 ppd.  She is happy with that.  She is not ready to quit.   HTN- no chest pain, dyspnea, dizziness, LE edema, taking medications daily.    HLD- taking genfibrozil and niacin daily.  Has not had lipids checked in more than a year.  Has been watching diet and has lost some weight.   Review of Systems See HPI    Objective:   Physical Exam BP 139/89  Pulse 94  Temp 98.3 F (36.8 C) (Oral)  Ht 5' 4.5" (1.638 m)  Wt 207 lb (93.895 kg)  BMI 34.98 kg/m2 General appearance: alert, cooperative and no distress Neck: no adenopathy, supple, symmetrical, trachea midline and thyroid not enlarged, symmetric, no tenderness/mass/nodules Lungs: clear to auscultation bilaterally Heart: regular rate and rhythm, S1, S2 normal, no murmur, click, rub or gallop Extremities: extremities normal, atraumatic, no cyanosis or edema Pulses: 2+ and symmetric       Assessment & Plan:

## 2012-07-30 NOTE — Assessment & Plan Note (Signed)
UA with + nitrites and Leuks, Rx for keflex 500 mg po bid.

## 2012-08-04 ENCOUNTER — Telehealth: Payer: Self-pay | Admitting: Family Medicine

## 2012-08-04 MED ORDER — CIPROFLOXACIN HCL 500 MG PO TABS: 500.0000 mg | ORAL_TABLET | Freq: Two times a day (BID) | ORAL | Status: DC

## 2012-08-04 NOTE — Telephone Encounter (Signed)
Attempted call back and there is no answer.  Will try again later. Will send message to MD.

## 2012-08-04 NOTE — Telephone Encounter (Signed)
Cipro called into her pharmacy for 5 days.  Please notify her.

## 2012-08-04 NOTE — Telephone Encounter (Signed)
Patient is calling because the abx she was prescribed is a form of penicillin and she says she is allergic to it so it is making her feel really bad.  She needs a different abx sent to [facility] [address].  She wants to know if she should continue taking this medication.

## 2012-08-04 NOTE — Telephone Encounter (Signed)
Patient notified

## 2012-08-09 ENCOUNTER — Ambulatory Visit: Payer: Medicare Other

## 2012-08-11 ENCOUNTER — Telehealth: Payer: Self-pay | Admitting: Family Medicine

## 2012-08-11 NOTE — Telephone Encounter (Signed)
Patient is calling for a Rx to [facility].

## 2012-08-12 NOTE — Telephone Encounter (Signed)
Rx written, will have it faxed.

## 2012-08-14 ENCOUNTER — Telehealth: Payer: Self-pay | Admitting: Family Medicine

## 2012-08-14 NOTE — Telephone Encounter (Signed)
Walking around 9 PM and tripped and fell.  She injured her LT knee, says "bone is sticking out."  She can barely walk on LT knee.  She has an old brace but waiting for a new brace per PCP.  No open wound or active bleeding.  She took Motrin and Gabapentin.  Pain well controlled.  Recommended ice, elevation, and continue NSAID.  She is going to Urgent Care center @ 10 AM to be evaluated.  Will route to PCP to watch for X-ray results.  Advised patient to follow up with PCP next week.

## 2012-08-23 ENCOUNTER — Other Ambulatory Visit: Payer: Self-pay | Admitting: Family Medicine

## 2012-08-31 ENCOUNTER — Encounter: Payer: Self-pay | Admitting: *Deleted

## 2012-08-31 NOTE — Telephone Encounter (Signed)
This encounter was created in error - please disregard.

## 2012-09-06 ENCOUNTER — Telehealth: Payer: Self-pay | Admitting: Family Medicine

## 2012-09-06 NOTE — Telephone Encounter (Signed)
[facility] Emergency Line  Patient calling because she is concerned she may be pregnant as "the man across the street told me I may be pregnant". She had a BTL reportedly and has not had a period in 4 years. Also complains of slight "tubes hurting" over last 2 days. Concerned new partner may have STI adn that she may have infection. Denies nausea/vomiting/fever/chills/fatigue/overall sick feelings.  Told patient to make appointment with [place] this week.

## 2012-09-10 ENCOUNTER — Telehealth: Payer: Self-pay | Admitting: Family Medicine

## 2012-09-10 ENCOUNTER — Ambulatory Visit (INDEPENDENT_AMBULATORY_CARE_PROVIDER_SITE_OTHER): Payer: Medicare Other | Admitting: Family Medicine

## 2012-09-10 ENCOUNTER — Encounter: Payer: Self-pay | Admitting: Family Medicine

## 2012-09-10 VITALS — BP 144/83 | HR 104 | Temp 97.7°F | Wt 202.0 lb

## 2012-09-10 DIAGNOSIS — R300 Dysuria: Secondary | ICD-10-CM

## 2012-09-10 DIAGNOSIS — N760 Acute vaginitis: Secondary | ICD-10-CM

## 2012-09-10 DIAGNOSIS — R062 Wheezing: Secondary | ICD-10-CM

## 2012-09-10 LAB — POCT UA - MICROSCOPIC ONLY

## 2012-09-10 LAB — POCT URINALYSIS DIPSTICK
Ketones, UA: NEGATIVE
Protein, UA: NEGATIVE
Spec Grav, UA: 1.01
Urobilinogen, UA: 0.2
pH, UA: 7

## 2012-09-10 MED ORDER — ALBUTEROL SULFATE (2.5 MG/3ML) 0.083% IN NEBU
2.5000 mg | INHALATION_SOLUTION | Freq: Once | RESPIRATORY_TRACT | Status: AC
  Administered 2012-09-10: 2.5 mg via RESPIRATORY_TRACT

## 2012-09-10 MED ORDER — IBUPROFEN 800 MG PO TABS: 800.0000 mg | ORAL_TABLET | Freq: Three times a day (TID) | ORAL | Status: DC | PRN

## 2012-09-10 MED ORDER — IPRATROPIUM BROMIDE 0.02 % IN SOLN
0.5000 mg | Freq: Once | RESPIRATORY_TRACT | Status: AC
  Administered 2012-09-10: 0.5 mg via RESPIRATORY_TRACT

## 2012-09-10 MED ORDER — CEPHALEXIN 500 MG PO CAPS: 500.0000 mg | ORAL_CAPSULE | Freq: Two times a day (BID) | ORAL | Status: DC

## 2012-09-10 NOTE — Patient Instructions (Addendum)
Krista [name], it was nice to meet you today. If you have a bladder infection, we will call you and send medication to your pharmacy Drink plenty of WATER and avoid sweetened drinks and juice. Schedule follow up appointment with your PCP in early January. If you develop worsening pelvic pain associated with fever, nausea/vomiting, chills, decreased urine output, please return to clinic.  Urinary Tract Infection A urinary tract infection (UTI) is often caused by a germ (bacteria). A UTI is usually helped with medicine (antibiotics) that kills germs. Take all the medicine until it is gone. Do this even if you are feeling better. You are usually better in 7 to 10 days. HOME CARE   Drink enough water and fluids to keep your pee (urine) clear or pale yellow. Drink:  Cranberry juice.  Water.  Avoid:  Caffeine.  Tea.  Bubbly (carbonated) drinks.  Alcohol.  Only take medicine as told by your doctor.  To prevent further infections:  Pee often.  After pooping (bowel movement), women should wipe from front to back. Use each tissue only once.  Pee before and after having sex (intercourse). Ask your doctor when your test results will be ready. Make sure you follow up and get your test results.  GET HELP RIGHT AWAY IF:   There is very bad back pain or lower belly (abdominal) pain.  You get the chills.  You have a fever.  Your baby is older than 3 months with a rectal temperature of 102 F (38.9 C) or higher.  Your baby is 76 months old or younger with a rectal temperature of 100.4 F (38 C) or higher.  You feel sick to your stomach (nauseous) or throw up (vomit).  There is continued burning with peeing.  Your problems are not better in 3 days. Return sooner if you are getting worse. MAKE SURE YOU:   Understand these instructions.  Will watch your condition.  Will get help right away if you are not doing well or get worse. Document Released: 04/07/2008 Document Revised:  01/12/2012 Document Reviewed: 04/07/2008 [facility] Patient Information 2013 [place], [place].

## 2012-09-10 NOTE — Telephone Encounter (Signed)
Tried to call patient.  Line is busy.  Please call patient and let her know that she does have a UTI and Keflex was sent to her pharmacy.  Thank you.

## 2012-09-11 ENCOUNTER — Encounter: Payer: Self-pay | Admitting: Family Medicine

## 2012-09-12 NOTE — Assessment & Plan Note (Signed)
UA positive for nitrites and large 3+ LE.  No hx of nausea/vomiting or pelvic pain.  Denies any vaginal discharge or recent sexual intercourse. - Keflex sent to pharmacy for uncomplicated UTI - Red flags reviewed with patient and per AVS  - Follow up PRN

## 2012-09-12 NOTE — Progress Notes (Signed)
  Subjective:    Krista [name] is a 56 y.o. female who complains of frequency and suprapubic pressure. She has had symptoms for 2 days. Patient also complains of blood sugars running high.  CBG today was 228.  She also proceeds to tell me that she went over to female friend's house and smoked pot.  She fell asleep and cannot remember any other events from that night.  When I ask her specifically about sexual intercourse, patient denies being "touched."  Denies any sexual abuse.  Patient denies back pain, hematuria, or vaginal discharge. Patient does have a history of recurrent UTI.  She denies any fever, chills, NS, nausea/vomiting.  Denies any decreased appetite.  The following portions of the patient's history were reviewed and updated as appropriate: allergies, current medications and problem list.  Review of Systems Pertinent items are noted in HPI.    Objective:    BP 144/83  Pulse 104  Temp 97.7 F (36.5 C) (Oral)  Wt 202 lb (91.627 kg) General appearance: alert, cooperative and no distress Abdomen: soft, non-tender; bowel sounds normal; no masses,  no organomegaly MSK: No CVA tenderness    Assessment:    Acute cystitis     Plan:    See Problem List

## 2012-09-13 ENCOUNTER — Ambulatory Visit: Payer: Medicare Other | Admitting: Family Medicine

## 2012-09-13 NOTE — Telephone Encounter (Signed)
Called to inform pt that she does have a UTI and that an rx has been sent to her pharmacy.Loralee Pacas [place].

## 2012-09-20 ENCOUNTER — Telehealth: Payer: Self-pay | Admitting: Family Medicine

## 2012-09-20 NOTE — Telephone Encounter (Signed)
Would like for something else to be called in for her bladder infection.Krista [name]

## 2012-09-27 ENCOUNTER — Encounter: Payer: Self-pay | Admitting: Family Medicine

## 2012-09-27 NOTE — Telephone Encounter (Signed)
This encounter was created in error - please disregard.

## 2012-09-27 NOTE — Telephone Encounter (Signed)
Error

## 2012-09-28 ENCOUNTER — Other Ambulatory Visit: Payer: Self-pay | Admitting: Family Medicine

## 2012-09-28 MED ORDER — SULFAMETHOXAZOLE-TMP DS 800-160 MG PO TABS: 1.0000 | ORAL_TABLET | Freq: Two times a day (BID) | ORAL | Status: DC

## 2012-09-28 NOTE — Telephone Encounter (Signed)
Patient is calling because she can't take Penicillin, she needs Sulfa medications.  If it is called in to [facility], they will deliver it for her.

## 2012-09-28 NOTE — Telephone Encounter (Signed)
Bactrim sent to pharmacy, please notify patient.

## 2012-09-28 NOTE — Telephone Encounter (Signed)
Advised pt of Bactrim being sent to pharmacy.

## 2012-10-02 ENCOUNTER — Other Ambulatory Visit: Payer: Self-pay | Admitting: Family Medicine

## 2012-10-07 ENCOUNTER — Encounter: Payer: Self-pay | Admitting: Family Medicine

## 2012-10-07 ENCOUNTER — Ambulatory Visit (INDEPENDENT_AMBULATORY_CARE_PROVIDER_SITE_OTHER): Payer: Medicare Other | Admitting: Family Medicine

## 2012-10-07 VITALS — BP 112/88 | HR 80 | Temp 98.8°F | Ht 64.5 in | Wt 206.0 lb

## 2012-10-07 DIAGNOSIS — E1149 Type 2 diabetes mellitus with other diabetic neurological complication: Secondary | ICD-10-CM

## 2012-10-07 DIAGNOSIS — S3660XA Unspecified injury of rectum, initial encounter: Secondary | ICD-10-CM

## 2012-10-07 DIAGNOSIS — M549 Dorsalgia, unspecified: Secondary | ICD-10-CM | POA: Insufficient documentation

## 2012-10-07 DIAGNOSIS — E1142 Type 2 diabetes mellitus with diabetic polyneuropathy: Secondary | ICD-10-CM | POA: Insufficient documentation

## 2012-10-07 DIAGNOSIS — I10 Essential (primary) hypertension: Secondary | ICD-10-CM

## 2012-10-07 DIAGNOSIS — E781 Pure hyperglyceridemia: Secondary | ICD-10-CM

## 2012-10-07 HISTORY — DX: Unspecified injury of rectum, initial encounter: S36.60XA

## 2012-10-07 LAB — BASIC METABOLIC PANEL
CO2: 26 mEq/L (ref 19–32)
Calcium: 10 mg/dL (ref 8.4–10.5)
Glucose, Bld: 77 mg/dL (ref 70–99)
Potassium: 4.3 mEq/L (ref 3.5–5.3)
Sodium: 131 mEq/L — ABNORMAL LOW (ref 135–145)

## 2012-10-07 LAB — LDL CHOLESTEROL, DIRECT: Direct LDL: 90 mg/dL

## 2012-10-07 MED ORDER — IBUPROFEN 600 MG PO TABS: 600.0000 mg | ORAL_TABLET | Freq: Two times a day (BID) | ORAL | Status: DC | PRN

## 2012-10-07 NOTE — Assessment & Plan Note (Signed)
Rx filled out for diabetic shoes.

## 2012-10-07 NOTE — Assessment & Plan Note (Signed)
Advised Vaseline as needed. Discussed relationship safety and offered resources, patient declines.

## 2012-10-07 NOTE — Assessment & Plan Note (Signed)
I had a long conversation with Krista [name] today about the dangers of long-term NSAID use.  I told her it was not safe for her to continue to take ibuprofen 800 TID.  She will try tylenol 1000 po TID and ibuprofen po 600 prn for back pain.  Also encouraged weight loss.

## 2012-10-07 NOTE — Patient Instructions (Signed)
It was good to see you.  You can use preparation H and vaseline on your rectum if it hurts. Please be sure you feel safe in your relationships.   For your back pain, I want you to take tylenol, two extra strength tablets three times a day.  I am refilling your ibuprofen, but you should only use it as needed for severe back pain.
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
## 2012-10-11 ENCOUNTER — Encounter: Payer: Self-pay | Admitting: Family Medicine

## 2012-10-13 ENCOUNTER — Telehealth: Payer: Self-pay | Admitting: Family Medicine

## 2012-10-13 NOTE — Telephone Encounter (Signed)
Patient is calling for her lab results.  

## 2012-10-14 NOTE — Telephone Encounter (Signed)
Advised pt of test results and that letter is in mail.

## 2012-10-14 NOTE — Telephone Encounter (Signed)
Please let Krista [name] know that a letter is in the mail with her lab results, but that her electrolytes, blood sugar, and kidney function are within normal limits.  Also, the LDL shows your cholesterol is under control.

## 2012-10-15 ENCOUNTER — Ambulatory Visit (INDEPENDENT_AMBULATORY_CARE_PROVIDER_SITE_OTHER): Payer: Medicare Other | Admitting: Family Medicine

## 2012-10-15 VITALS — BP 118/79 | HR 81 | Temp 98.0°F | Wt 205.0 lb

## 2012-10-15 DIAGNOSIS — M549 Dorsalgia, unspecified: Secondary | ICD-10-CM

## 2012-10-15 NOTE — Assessment & Plan Note (Signed)
Will increase flexeril to TID, advised heat, voltaren gel.  Gave hand out with home exercise program for low back strain.
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
## 2012-10-15 NOTE — Patient Instructions (Signed)
I think you have pulled muscles in your back.   - please use a heating pad on your back - take the flexeril up to three times a day - use the voltaren gel on your back - see the hand out with stretches and exercises.  - be as active as possible.

## 2012-10-17 ENCOUNTER — Emergency Department (HOSPITAL_COMMUNITY)
Admission: EM | Admit: 2012-10-17 | Discharge: 2012-10-17 | Disposition: A | Discharge status: Home / Self Care | Payer: Medicare Other | Attending: Emergency Medicine | Admitting: Emergency Medicine

## 2012-10-17 ENCOUNTER — Encounter (HOSPITAL_COMMUNITY): Payer: Self-pay | Admitting: *Deleted

## 2012-10-17 DIAGNOSIS — M545 Low back pain, unspecified: Secondary | ICD-10-CM | POA: Insufficient documentation

## 2012-10-17 DIAGNOSIS — E119 Type 2 diabetes mellitus without complications: Secondary | ICD-10-CM | POA: Insufficient documentation

## 2012-10-17 DIAGNOSIS — F17200 Nicotine dependence, unspecified, uncomplicated: Secondary | ICD-10-CM | POA: Insufficient documentation

## 2012-10-17 DIAGNOSIS — I10 Essential (primary) hypertension: Secondary | ICD-10-CM | POA: Insufficient documentation

## 2012-10-17 DIAGNOSIS — J309 Allergic rhinitis, unspecified: Secondary | ICD-10-CM | POA: Insufficient documentation

## 2012-10-17 DIAGNOSIS — K219 Gastro-esophageal reflux disease without esophagitis: Secondary | ICD-10-CM | POA: Insufficient documentation

## 2012-10-17 DIAGNOSIS — Z79899 Other long term (current) drug therapy: Secondary | ICD-10-CM | POA: Insufficient documentation

## 2012-10-17 DIAGNOSIS — M129 Arthropathy, unspecified: Secondary | ICD-10-CM | POA: Insufficient documentation

## 2012-10-17 DIAGNOSIS — E785 Hyperlipidemia, unspecified: Secondary | ICD-10-CM | POA: Insufficient documentation

## 2012-10-17 DIAGNOSIS — Z7982 Long term (current) use of aspirin: Secondary | ICD-10-CM | POA: Insufficient documentation

## 2012-10-17 HISTORY — DX: Dorsalgia, unspecified: M54.9

## 2012-10-17 LAB — URINALYSIS, ROUTINE W REFLEX MICROSCOPIC
Nitrite: NEGATIVE
Specific Gravity, Urine: 1.008 (ref 1.005–1.030)
Urobilinogen, UA: 0.2 mg/dL (ref 0.0–1.0)
pH: 7 (ref 5.0–8.0)

## 2012-10-17 LAB — URINE MICROSCOPIC-ADD ON

## 2012-10-17 MED ORDER — HYDROCODONE-ACETAMINOPHEN 5-325 MG PO TABS: 1.0000 | ORAL_TABLET | ORAL | Status: DC | PRN

## 2012-10-17 MED ORDER — HYDROCODONE-ACETAMINOPHEN 5-325 MG PO TABS
1.0000 | ORAL_TABLET | Freq: Once | ORAL | Status: AC
  Administered 2012-10-17: 1 via ORAL
  Filled 2012-10-17: qty 1

## 2012-10-17 NOTE — ED Provider Notes (Signed)
History     CSN: [id number]  Arrival date & time 10/17/12  4098   First MD Initiated Contact with Patient 10/17/12 0901      Chief Complaint  Patient presents with  . Back Pain    (Consider location/radiation/quality/duration/timing/severity/associated sxs/prior treatment) Patient is a 56 y.o. female presenting with back pain. The history is provided by the patient.  Back Pain  The current episode started more than 1 week ago. The problem occurs constantly. The problem has not changed since onset.Associated with: She started having back pain earlier in December. The pain is severe. Pertinent negatives include no fever, no numbness, no headaches, no abdominal pain, no bowel incontinence, no bladder incontinence, no paresthesias and no weakness. Risk factors include obesity.    Past Medical History  Diagnosis Date  . Arthritis   . Diabetes mellitus   . Hyperlipidemia   . Hypertension   . Allergy   . GERD (gastroesophageal reflux disease)   . Back pain     History reviewed. No pertinent past surgical history.  Family History  Problem Relation Age of Onset  . Diabetes Mother   . COPD Mother   . Heart disease Mother   . Hyperlipidemia Mother   . Hypertension Mother   . Arthritis Mother     History  Substance Use Topics  . Smoking status: Current Every Day Smoker -- 1.0 packs/day    Types: Cigarettes  . Smokeless tobacco: Never Used  . Alcohol Use: No    OB History    Grav Para Term Preterm Abortions TAB SAB Ect Mult Living                  Review of Systems  Constitutional: Negative for fever and chills.  Cardiovascular: Negative.   Gastrointestinal: Negative.  Negative for nausea, abdominal pain and bowel incontinence.  Genitourinary: Negative for bladder incontinence.  Musculoskeletal: Positive for back pain.  Neurological: Negative.  Negative for weakness, numbness, headaches and paresthesias.    Allergies  Codeine  Home Medications   Current  Outpatient Rx  Name  Route  Sig  Dispense  Refill  . ALBUTEROL SULFATE HFA 108 (90 BASE) MCG/ACT IN AERS   Inhalation   Inhale 2 puffs into the lungs every 6 (six) hours as needed for wheezing.   2 Inhaler   5   . ASPIRIN 81 MG PO TBEC   Oral   Take 81 mg by mouth every evening.         Marland Kitchen BENZTROPINE MESYLATE 1 MG PO TABS   Oral   Take 1 tablet (1 mg total) by mouth at bedtime.   90 tablet   3   . CYCLOBENZAPRINE HCL 5 MG PO TABS               . DICLOFENAC SODIUM 1 % TD GEL   Topical   Apply 1 application topically 4 (four) times daily. Apply 4 g two times a day  to qid as needed .   100 g   11   . FLUTICASONE PROPIONATE 50 MCG/ACT NA SUSP   Nasal   Place 1 spray into the nose daily.   16 g   11   . GABAPENTIN 300 MG PO CAPS   Oral   Take 1 capsule (300 mg total) by mouth 3 (three) times daily.   270 capsule   3   . GEMFIBROZIL 600 MG PO TABS   Oral   Take 1 tablet (600 mg total)  by mouth 2 (two) times daily.   180 tablet   3   . IBUPROFEN 600 MG PO TABS   Oral   Take 1 tablet (600 mg total) by mouth 2 (two) times daily as needed for pain.   60 tablet   1     Please send all refill request electronically.   Marland Kitchen LISINOPRIL-HYDROCHLOROTHIAZIDE 20-25 MG PO TABS   Oral   Take 1 tablet by mouth every morning.         Marland Kitchen METFORMIN HCL 500 MG PO TABS      2 tabs by mouth qam and 1 tab by mouth qhs   180 tablet   3     Please send all refill request electronically.   Marland Kitchen NIACIN ER (ANTIHYPERLIPIDEMIC) 750 MG PO TBCR   Oral   Take 750 mg by mouth at bedtime.         . OMEPRAZOLE 40 MG PO CPDR   Oral   Take 40 mg by mouth every morning.         . OXYBUTYNIN CHLORIDE 5 MG PO TABS   Oral   Take 5 mg by mouth daily.         Marland Kitchen KNEE SLEEVE/KIDS LARGE MISC   Does not apply   1 Device by Does not apply route once.   1 each   0   . FLUPHENAZINE DECANOATE 25 MG/ML IJ SOLN   Intramuscular   Inject 25 mg into the muscle every 14 (fourteen) days.  No instruction given         . SULFAMETHOXAZOLE-TMP DS 800-160 MG PO TABS   Oral   Take 1 tablet by mouth 2 (two) times daily.   10 tablet   0     BP 126/77  Pulse 79  Temp 97.5 F (36.4 C)  Resp 20  SpO2 100%  Physical Exam  Constitutional: She is oriented to person, place, and time. She appears well-developed and well-nourished.  HENT:  Head: Normocephalic.  Neck: Normal range of motion. Neck supple.  Cardiovascular: Normal rate and regular rhythm.   Pulmonary/Chest: Effort normal and breath sounds normal.  Abdominal: Soft. Bowel sounds are normal. There is no tenderness. There is no rebound and no guarding.  Musculoskeletal: Normal range of motion.       Mild lumbar and paralumbar tenderness without swelling or discoloration.  Neurological: She is alert and oriented to person, place, and time. She has normal reflexes. Coordination normal.       Ambulatory without loss of balance. Normal sensory exam LE's.  Skin: Skin is warm and dry. No rash noted.  Psychiatric: She has a normal mood and affect.    ED Course  Procedures (including critical care time)  Labs Reviewed - No data to display No results found.   No diagnosis found.  1. Low back pain  MDM  Patient with several weeks of low back pain that is persistent. Will treat pain and give ortho referral.        Rodena Medin, PA-C 10/17/12 [phone]

## 2012-10-17 NOTE — ED Notes (Signed)
Per EMS pt complaining of back pain, went to [facility] this past Friday, was sent home w/ flexeril and ibuprofen, pt states it is not helping. BP 172/100, HR 92, RR 18.

## 2012-10-18 ENCOUNTER — Telehealth: Payer: Self-pay | Admitting: Family Medicine

## 2012-10-18 NOTE — ED Provider Notes (Signed)
Medical screening examination/treatment/procedure(s) were performed by non-physician practitioner and as supervising physician I was immediately available for consultation/collaboration.  Jones Skene, M.D.     Jones Skene, MD 10/18/12 1531

## 2012-10-18 NOTE — Telephone Encounter (Signed)
Advised pt of office policy. Voiced understanding.

## 2012-10-18 NOTE — Telephone Encounter (Signed)
Per clinic policy we do not prescribe controlled substances over the phone.  Please notify her.

## 2012-10-18 NOTE — Telephone Encounter (Signed)
Patient is calling because she needs a refill on her Vicodin sent to [facility] on [address].  She went to the ER due to her back pain.

## 2012-10-21 ENCOUNTER — Other Ambulatory Visit: Payer: Self-pay | Admitting: Sports Medicine

## 2012-10-21 DIAGNOSIS — M545 Low back pain: Secondary | ICD-10-CM

## 2012-10-29 ENCOUNTER — Telehealth: Payer: Self-pay | Admitting: Family Medicine

## 2012-10-29 NOTE — Telephone Encounter (Signed)
Krista [name], discussed that I cannot prescribe Narcotics over the phone.  I told her that if Dr. Reola Calkins is treating her back pain she should see Dr. Reola Calkins.  Let her know that I do not think chronic narcotics are usually a safe medication.  She should take her ibuprofen and tylenol, use a heating pad, and do back stretches and exercises.  She voices understanding.
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
## 2012-11-01 ENCOUNTER — Other Ambulatory Visit: Payer: Medicare Other

## 2012-11-05 ENCOUNTER — Telehealth: Payer: Self-pay | Admitting: Family Medicine

## 2012-11-05 NOTE — Telephone Encounter (Signed)
Patient called to discuss back pain. She was a poor historian and difficult to hear, but it seems like she is having acute on chronic back pain for which she takes gabapentin, ibuprofen and occasionally flexeril. She was also given hydrocodone, but says that makes her "heart hurt" and upsets her stomach. She denies any new trauma or falls. I explained that this is chronic issue that I could not do much for over the phone. I recommended she take a flexeril for pain and benadryl to help her sleep. She was agreeable to this and will follow up with her PCP.   Si Raider Clinton Sawyer, MD, MBA 11/05/2012, 1:00 AM Family Medicine Resident, PGY-2 [phone] pager

## 2012-11-17 ENCOUNTER — Ambulatory Visit (INDEPENDENT_AMBULATORY_CARE_PROVIDER_SITE_OTHER): Payer: Medicare Other | Admitting: Family Medicine

## 2012-11-17 VITALS — BP 133/83 | HR 109 | Temp 98.2°F | Ht 64.5 in | Wt 191.0 lb

## 2012-11-17 DIAGNOSIS — R300 Dysuria: Secondary | ICD-10-CM

## 2012-11-17 DIAGNOSIS — R350 Frequency of micturition: Secondary | ICD-10-CM

## 2012-11-17 DIAGNOSIS — M549 Dorsalgia, unspecified: Secondary | ICD-10-CM

## 2012-11-17 LAB — POCT URINALYSIS DIPSTICK
Bilirubin, UA: NEGATIVE
Nitrite, UA: NEGATIVE
Spec Grav, UA: 1.01
pH, UA: 6.5

## 2012-11-17 LAB — POCT UA - MICROSCOPIC ONLY

## 2012-11-17 MED ORDER — CEPHALEXIN 500 MG PO CAPS: 500.0000 mg | ORAL_CAPSULE | Freq: Two times a day (BID) | ORAL | Status: DC

## 2012-11-17 MED ORDER — IBUPROFEN 600 MG PO TABS: 600.0000 mg | ORAL_TABLET | Freq: Two times a day (BID) | ORAL | Status: DC | PRN

## 2012-11-17 MED ORDER — CYCLOBENZAPRINE HCL 5 MG PO TABS: 5.0000 mg | ORAL_TABLET | Freq: Two times a day (BID) | ORAL | Status: DC | PRN

## 2012-11-17 NOTE — Assessment & Plan Note (Signed)
I again offered Physical therapy for back pain, she declines, I gave her a hand out with home exercise program a second time today.  I reviewed dangers of NSAID use (kidney failure and stomach bleeding).  Advised to schedule tylenol, use ibuprofen as needed, use heat or ice and flexeril.  I do not feel this patient is appropriate for long-term narcotic use and will not prescribe them to her.

## 2012-11-17 NOTE — Assessment & Plan Note (Signed)
UA concerning for UTI, do not feel back pain is related to UTI/Pyleo as it is unchanged from previous.  rx for keflex.
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
## 2012-11-17 NOTE — Patient Instructions (Signed)
I am sorry your back hurts.  Please start taking two extra strength tylenol with breakfast, lunch and dinner.  Also, please try heat or ice on your back.  Please start doing the exercises and stretches in the hand out twice a day.  You can use the ibuprofen as needed for back pain.   Please take the antibiotics twice a day for a week.

## 2012-11-19 ENCOUNTER — Telehealth: Payer: Self-pay | Admitting: Family Medicine

## 2012-11-19 NOTE — Telephone Encounter (Signed)
Called number provided, female answered the phone and states that she does not live there.  Will await callback. Montgomery Rothlisberger, Maryjo [place]

## 2012-11-19 NOTE — Telephone Encounter (Signed)
Patient was seen 10/07/12, I documented that I would fax paperwork for diabetic shoes.  This was more than one month ago, and unfortunately we only keep fax records for a month, so I have no way of checking if this was done.  Let Ryelle know she can bring another form by or mail it to [place] and I will fill it out and have it faxed.  She says she will do this.

## 2012-11-19 NOTE — Telephone Encounter (Signed)
Pt states that she brought paperwork in at her last appt 2 weeks ago.  Wanted to know status -   Also that was her brother that answered.Marland KitchenMarland Kitchen

## 2012-11-19 NOTE — Telephone Encounter (Signed)
Please let Krista [name] know our policy about diabetic supplies- we need to discuss the product and need for them during an office visit, she needs to bring the paperwork with her to be filled out.  To prevent fraud, we do not fill out forms that have been faxed to [place] from supply companies.

## 2012-11-19 NOTE — Telephone Encounter (Signed)
Wants to know when Dr Lula Olszewski is going to send paperwork for her SM shoes to company.  pls advise

## 2013-01-02 ENCOUNTER — Emergency Department (HOSPITAL_COMMUNITY)
Admission: EM | Admit: 2013-01-02 | Discharge: 2013-01-02 | Disposition: A | Discharge status: Home / Self Care | Payer: Medicare Other | Attending: Emergency Medicine | Admitting: Emergency Medicine

## 2013-01-02 ENCOUNTER — Encounter (HOSPITAL_COMMUNITY): Payer: Self-pay | Admitting: *Deleted

## 2013-01-02 ENCOUNTER — Emergency Department (HOSPITAL_COMMUNITY): Payer: Medicare Other

## 2013-01-02 DIAGNOSIS — Z862 Personal history of diseases of the blood and blood-forming organs and certain disorders involving the immune mechanism: Secondary | ICD-10-CM | POA: Insufficient documentation

## 2013-01-02 DIAGNOSIS — Z8639 Personal history of other endocrine, nutritional and metabolic disease: Secondary | ICD-10-CM | POA: Insufficient documentation

## 2013-01-02 DIAGNOSIS — Z79899 Other long term (current) drug therapy: Secondary | ICD-10-CM | POA: Insufficient documentation

## 2013-01-02 DIAGNOSIS — R109 Unspecified abdominal pain: Secondary | ICD-10-CM

## 2013-01-02 DIAGNOSIS — M549 Dorsalgia, unspecified: Secondary | ICD-10-CM | POA: Insufficient documentation

## 2013-01-02 DIAGNOSIS — R1012 Left upper quadrant pain: Secondary | ICD-10-CM | POA: Insufficient documentation

## 2013-01-02 DIAGNOSIS — F17200 Nicotine dependence, unspecified, uncomplicated: Secondary | ICD-10-CM | POA: Insufficient documentation

## 2013-01-02 DIAGNOSIS — I10 Essential (primary) hypertension: Secondary | ICD-10-CM | POA: Insufficient documentation

## 2013-01-02 DIAGNOSIS — Z8739 Personal history of other diseases of the musculoskeletal system and connective tissue: Secondary | ICD-10-CM | POA: Insufficient documentation

## 2013-01-02 DIAGNOSIS — N390 Urinary tract infection, site not specified: Secondary | ICD-10-CM

## 2013-01-02 DIAGNOSIS — K219 Gastro-esophageal reflux disease without esophagitis: Secondary | ICD-10-CM | POA: Insufficient documentation

## 2013-01-02 DIAGNOSIS — E119 Type 2 diabetes mellitus without complications: Secondary | ICD-10-CM | POA: Insufficient documentation

## 2013-01-02 LAB — COMPREHENSIVE METABOLIC PANEL
ALT: 9 U/L (ref 0–35)
Alkaline Phosphatase: 162 U/L — ABNORMAL HIGH (ref 39–117)
BUN: 6 mg/dL (ref 6–23)
CO2: 26 mEq/L (ref 19–32)
Chloride: 82 mEq/L — ABNORMAL LOW (ref 96–112)
GFR calc Af Amer: >90 mL/min (ref >90)
Glucose, Bld: 126 mg/dL — ABNORMAL HIGH (ref 70–99)
Potassium: 3.1 mEq/L — ABNORMAL LOW (ref 3.5–5.1)
Sodium: 120 mEq/L — ABNORMAL LOW (ref 135–145)
Total Bilirubin: 0.7 mg/dL (ref 0.3–1.2)

## 2013-01-02 LAB — CBC WITH DIFFERENTIAL/PLATELET
Hemoglobin: 11.7 g/dL — ABNORMAL LOW (ref 12.0–15.0)
Lymphocytes Relative: 21 % (ref 12–46)
Lymphs Abs: 1.8 10*3/uL (ref 0.7–4.0)
MCH: 30.2 pg (ref 26.0–34.0)
Monocytes Relative: 7 % (ref 3–12)
Neutro Abs: 6.4 10*3/uL (ref 1.7–7.7)
Neutrophils Relative %: 72 % (ref 43–77)
Platelets: 349 10*3/uL (ref 150–400)
RBC: 3.88 MIL/uL (ref 3.87–5.11)
WBC: 8.9 10*3/uL (ref 4.0–10.5)

## 2013-01-02 LAB — URINALYSIS, ROUTINE W REFLEX MICROSCOPIC
Bilirubin Urine: NEGATIVE
Glucose, UA: NEGATIVE mg/dL
Ketones, ur: NEGATIVE mg/dL
Nitrite: NEGATIVE
Protein, ur: NEGATIVE mg/dL
pH: 7 (ref 5.0–8.0)

## 2013-01-02 LAB — URINE MICROSCOPIC-ADD ON

## 2013-01-02 MED ORDER — DEXTROSE 5 % IV SOLN
1.0000 g | Freq: Once | INTRAVENOUS | Status: AC
  Administered 2013-01-02: 1 g via INTRAVENOUS
  Filled 2013-01-02: qty 10

## 2013-01-02 MED ORDER — OXYCODONE-ACETAMINOPHEN 5-325 MG PO TABS: 2.0000 | ORAL_TABLET | ORAL | Status: DC | PRN

## 2013-01-02 MED ORDER — SODIUM CHLORIDE 0.9 % IV BOLUS (SEPSIS)
1000.0000 mL | Freq: Once | INTRAVENOUS | Status: AC
  Administered 2013-01-02: 1000 mL via INTRAVENOUS

## 2013-01-02 MED ORDER — KETOROLAC TROMETHAMINE 30 MG/ML IJ SOLN
30.0000 mg | Freq: Once | INTRAMUSCULAR | Status: AC
  Administered 2013-01-02: 30 mg via INTRAVENOUS
  Filled 2013-01-02: qty 1

## 2013-01-02 MED ORDER — CIPROFLOXACIN HCL 500 MG PO TABS: 500.0000 mg | ORAL_TABLET | Freq: Two times a day (BID) | ORAL | Status: DC

## 2013-01-02 MED ORDER — OXYCODONE-ACETAMINOPHEN 5-325 MG PO TABS
1.0000 | ORAL_TABLET | Freq: Once | ORAL | Status: AC
  Administered 2013-01-02: 1 via ORAL
  Filled 2013-01-02: qty 1

## 2013-01-02 MED ORDER — POTASSIUM CHLORIDE CRYS ER 20 MEQ PO TBCR
40.0000 meq | EXTENDED_RELEASE_TABLET | Freq: Once | ORAL | Status: AC
  Administered 2013-01-02: 40 meq via ORAL
  Filled 2013-01-02: qty 2
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
## 2013-01-02 NOTE — ED Notes (Signed)
Per EMS pt from home, complaining of LQ abdominal pain radiating to lower back, hx of kidney stones but pt states this does not feel like a kidney stone, pain 8/10, pt states had diarrhea yesterday, denies n/v, pain since this morning. Hx of lower back pain also. BP 142/84, HR 88

## 2013-01-04 LAB — URINE CULTURE

## 2013-01-05 NOTE — ED Notes (Signed)
+   Urine Patient treated with Cipro-sensitive to same-chart appended per protocol MD. 

## 2013-01-11 ENCOUNTER — Other Ambulatory Visit: Payer: Self-pay | Admitting: Family Medicine

## 2013-01-19 ENCOUNTER — Emergency Department (HOSPITAL_COMMUNITY): Payer: Medicare Other

## 2013-01-19 ENCOUNTER — Encounter (HOSPITAL_COMMUNITY): Payer: Self-pay | Admitting: *Deleted

## 2013-01-19 ENCOUNTER — Inpatient Hospital Stay (HOSPITAL_COMMUNITY)
Admission: EM | Admit: 2013-01-19 | Discharge: 2013-01-24 | DRG: 871 | Disposition: A | Discharge status: Home Health | Payer: Medicare Other | Attending: Family Medicine | Admitting: Family Medicine

## 2013-01-19 DIAGNOSIS — F29 Unspecified psychosis not due to a substance or known physiological condition: Secondary | ICD-10-CM

## 2013-01-19 DIAGNOSIS — M129 Arthropathy, unspecified: Secondary | ICD-10-CM | POA: Diagnosis present

## 2013-01-19 DIAGNOSIS — I10 Essential (primary) hypertension: Secondary | ICD-10-CM | POA: Diagnosis present

## 2013-01-19 DIAGNOSIS — E119 Type 2 diabetes mellitus without complications: Secondary | ICD-10-CM | POA: Diagnosis present

## 2013-01-19 DIAGNOSIS — R6520 Severe sepsis without septic shock: Secondary | ICD-10-CM | POA: Diagnosis present

## 2013-01-19 DIAGNOSIS — E11319 Type 2 diabetes mellitus with unspecified diabetic retinopathy without macular edema: Secondary | ICD-10-CM | POA: Diagnosis present

## 2013-01-19 DIAGNOSIS — A419 Sepsis, unspecified organism: Principal | ICD-10-CM | POA: Diagnosis present

## 2013-01-19 DIAGNOSIS — E1142 Type 2 diabetes mellitus with diabetic polyneuropathy: Secondary | ICD-10-CM

## 2013-01-19 DIAGNOSIS — R6521 Severe sepsis with septic shock: Secondary | ICD-10-CM

## 2013-01-19 DIAGNOSIS — F17200 Nicotine dependence, unspecified, uncomplicated: Secondary | ICD-10-CM | POA: Diagnosis present

## 2013-01-19 DIAGNOSIS — Z79899 Other long term (current) drug therapy: Secondary | ICD-10-CM

## 2013-01-19 DIAGNOSIS — K219 Gastro-esophageal reflux disease without esophagitis: Secondary | ICD-10-CM | POA: Diagnosis present

## 2013-01-19 DIAGNOSIS — F2089 Other schizophrenia: Secondary | ICD-10-CM | POA: Diagnosis present

## 2013-01-19 DIAGNOSIS — R404 Transient alteration of awareness: Secondary | ICD-10-CM | POA: Diagnosis not present

## 2013-01-19 DIAGNOSIS — Z87442 Personal history of urinary calculi: Secondary | ICD-10-CM

## 2013-01-19 DIAGNOSIS — F05 Delirium due to known physiological condition: Secondary | ICD-10-CM | POA: Diagnosis present

## 2013-01-19 DIAGNOSIS — E785 Hyperlipidemia, unspecified: Secondary | ICD-10-CM | POA: Diagnosis present

## 2013-01-19 DIAGNOSIS — N179 Acute kidney failure, unspecified: Secondary | ICD-10-CM | POA: Diagnosis present

## 2013-01-19 DIAGNOSIS — E871 Hypo-osmolality and hyponatremia: Secondary | ICD-10-CM

## 2013-01-19 DIAGNOSIS — N390 Urinary tract infection, site not specified: Secondary | ICD-10-CM | POA: Diagnosis present

## 2013-01-19 DIAGNOSIS — E876 Hypokalemia: Secondary | ICD-10-CM | POA: Diagnosis present

## 2013-01-19 LAB — CBC WITH DIFFERENTIAL/PLATELET
Basophils Absolute: 0 10*3/uL (ref 0.0–0.1)
Basophils Relative: 0 % (ref 0–1)
Eosinophils Absolute: 0 10*3/uL (ref 0.0–0.7)
Eosinophils Relative: 0 % (ref 0–5)
HCT: 35.3 % — ABNORMAL LOW (ref 36.0–46.0)
Hemoglobin: 13.2 g/dL (ref 12.0–15.0)
Lymphocytes Relative: 20 % (ref 12–46)
Lymphs Abs: 2.9 10*3/uL (ref 0.7–4.0)
MCH: 30.6 pg (ref 26.0–34.0)
MCHC: 37.4 g/dL — ABNORMAL HIGH (ref 30.0–36.0)
MCV: 81.7 fL (ref 78.0–100.0)
Monocytes Absolute: 1 10*3/uL (ref 0.1–1.0)
Monocytes Relative: 7 % (ref 3–12)
Neutro Abs: 10.9 10*3/uL — ABNORMAL HIGH (ref 1.7–7.7)
Neutrophils Relative %: 74 % (ref 43–77)
Platelets: 404 10*3/uL — ABNORMAL HIGH (ref 150–400)
RBC: 4.32 MIL/uL (ref 3.87–5.11)
RDW: 12.5 % (ref 11.5–15.5)
WBC: 14.8 10*3/uL — ABNORMAL HIGH (ref 4.0–10.5)

## 2013-01-19 LAB — COMPREHENSIVE METABOLIC PANEL
ALT: 40 U/L — ABNORMAL HIGH (ref 0–35)
AST: 44 U/L — ABNORMAL HIGH (ref 0–37)
Albumin: 3.5 g/dL (ref 3.5–5.2)
Alkaline Phosphatase: 140 U/L — ABNORMAL HIGH (ref 39–117)
BUN: 98 mg/dL — ABNORMAL HIGH (ref 6–23)
CO2: 18 mEq/L — ABNORMAL LOW (ref 19–32)
Calcium: 9.3 mg/dL (ref 8.4–10.5)
Chloride: 73 mEq/L — ABNORMAL LOW (ref 96–112)
Creatinine, Ser: 7.77 mg/dL — ABNORMAL HIGH (ref 0.50–1.10)
GFR calc Af Amer: 6 mL/min — ABNORMAL LOW (ref >90)
GFR calc non Af Amer: 5 mL/min — ABNORMAL LOW (ref >90)
Glucose, Bld: 158 mg/dL — ABNORMAL HIGH (ref 70–99)
Potassium: 3.1 mEq/L — ABNORMAL LOW (ref 3.5–5.1)
Sodium: 117 mEq/L — CL (ref 135–145)
Total Bilirubin: 0.9 mg/dL (ref 0.3–1.2)
Total Protein: 6.9 g/dL (ref 6.0–8.3)

## 2013-01-19 LAB — URINE MICROSCOPIC-ADD ON

## 2013-01-19 LAB — URINALYSIS, ROUTINE W REFLEX MICROSCOPIC
Glucose, UA: 100 mg/dL — AB
Ketones, ur: NEGATIVE mg/dL
Nitrite: POSITIVE — AB
Protein, ur: >300 mg/dL — AB
Specific Gravity, Urine: 1.025 (ref 1.005–1.030)
Urobilinogen, UA: 1 mg/dL (ref 0.0–1.0)
pH: 5.5 (ref 5.0–8.0)

## 2013-01-19 LAB — CG4 I-STAT (LACTIC ACID): Lactic Acid, Venous: 1.37 mmol/L (ref 0.5–2.2)

## 2013-01-19 LAB — TROPONIN I: Troponin I: <0.3 ng/mL (ref <0.30)

## 2013-01-19 MED ORDER — PIPERACILLIN-TAZOBACTAM 3.375 G IVPB 30 MIN
3.3750 g | Freq: Once | INTRAVENOUS | Status: AC
  Administered 2013-01-19: 3.375 g via INTRAVENOUS
  Filled 2013-01-19: qty 50

## 2013-01-19 MED ORDER — SODIUM CHLORIDE 0.9 % IV BOLUS (SEPSIS)
2000.0000 mL | Freq: Once | INTRAVENOUS | Status: AC
  Administered 2013-01-19: 2000 mL via INTRAVENOUS

## 2013-01-19 MED ORDER — NOREPINEPHRINE BITARTRATE 1 MG/ML IJ SOLN
2.0000 ug/min | Freq: Once | INTRAVENOUS | Status: AC
  Administered 2013-01-20: 5 ug/min via INTRAVENOUS
  Filled 2013-01-19: qty 4

## 2013-01-19 MED ORDER — POTASSIUM CHLORIDE 10 MEQ/100ML IV SOLN: 10.0000 meq | Freq: Once | INTRAVENOUS | Status: DC

## 2013-01-19 MED ORDER — POTASSIUM CHLORIDE 10 MEQ/100ML IV SOLN
10.0000 meq | INTRAVENOUS | Status: AC
  Administered 2013-01-19 – 2013-01-20 (×3): 10 meq via INTRAVENOUS
  Filled 2013-01-19 (×2): qty 100
  Filled 2013-01-19: qty 200

## 2013-01-19 NOTE — ED Notes (Signed)
MD at bedside inserting central line.

## 2013-01-19 NOTE — ED Notes (Signed)
Per EMS:  Pt went to mental health dr on the 12 and got a shot of prolixin And ever since then pt has been "out of it" and pt's family waited until today to call EMS.  Pt has no other complaints other than diarrhea.  Equal grip strength, some facial droop which family st's is normal for her.  Alert and oriented x 4, pt is just slow to respond.  Pt st's her CBG usually runs 120.

## 2013-01-19 NOTE — ED Provider Notes (Signed)
  CENTRAL LINE Date/Time: 01/19/2013 11:45 PM Performed by: Dierdre Forth Authorized by: Dierdre Forth Consent: Verbal consent obtained. Risks and benefits: risks, benefits and alternatives were discussed Consent given by: patient Patient understanding: patient states understanding of the procedure being performed Patient consent: the patient's understanding of the procedure matches consent given Procedure consent: procedure consent matches procedure scheduled Relevant documents: relevant documents present and verified Site marked: the operative site was marked Required items: required blood products, implants, devices, and special equipment available Patient identity confirmed: verbally with patient and arm band Time out: Immediately prior to procedure a "time out" was called to verify the correct patient, procedure, equipment, support staff and site/side marked as required. Indications: vascular access Anesthesia: local infiltration Local anesthetic: lidocaine 1% without epinephrine Anesthetic total: 5 ml Patient sedated: no Preparation: skin prepped with 2% chlorhexidine Skin prep agent dried: skin prep agent completely dried prior to procedure Sterile barriers: all five maximum sterile barriers used - cap, mask, sterile gown, sterile gloves, and large sterile sheet Hand hygiene: hand hygiene performed prior to central venous catheter insertion Location details: right internal jugular Patient position: flat Catheter type: triple lumen Catheter size: 7 Fr Pre-procedure: landmarks identified Ultrasound guidance: yes Number of attempts: 1 Successful placement: yes Post-procedure: line sutured and dressing applied Assessment: blood return through all ports, free fluid flow, placement verified by x-ray and no pneumothorax on x-ray Patient tolerance: Patient tolerated the procedure well with no immediate complications.    Dahlia Client Lucille Crichlow, PA-C 01/20/13 [phone]

## 2013-01-19 NOTE — ED Provider Notes (Signed)
History    57 year old female with decreased mental status. Basically generalized fatigue. Gradual onset about a week ago and progressively worsening. Patient relates this to timing of a Prolixin injection. She has received this in the past though with no similar side effects. Patient denies any acute pain anywhere. No fever or chills. No shortness of breath. No urinary complaints. No acute trauma. No rash. No recent medication changes.  CSN: [id number]  Arrival date & time 01/19/13  2024   First MD Initiated Contact with Patient 01/19/13 2034      No chief complaint on file.   (Consider location/radiation/quality/duration/timing/severity/associated sxs/prior treatment) HPI  Past Medical History  Diagnosis Date  . Arthritis   . Diabetes mellitus   . Hyperlipidemia   . Hypertension   . Allergy   . GERD (gastroesophageal reflux disease)   . Back pain     History reviewed. No pertinent past surgical history.  Family History  Problem Relation Age of Onset  . Diabetes Mother   . COPD Mother   . Heart disease Mother   . Hyperlipidemia Mother   . Hypertension Mother   . Arthritis Mother     History  Substance Use Topics  . Smoking status: Current Every Day Smoker -- 1.00 packs/day    Types: Cigarettes  . Smokeless tobacco: Never Used  . Alcohol Use: No    OB History   Grav Para Term Preterm Abortions TAB SAB Ect Mult Living                  Review of Systems  All systems reviewed and negative, other than as noted in HPI.   Allergies  Codeine  Home Medications   Current Outpatient Rx  Name  Route  Sig  Dispense  Refill  . benztropine (COGENTIN) 1 MG tablet   Oral   Take 1 tablet (1 mg total) by mouth at bedtime.   90 tablet   3   . ciprofloxacin (CIPRO) 500 MG tablet   Oral   Take 1 tablet (500 mg total) by mouth every 12 (twelve) hours.   20 tablet   0   . fluPHENAZine decanoate (PROLIXIN) 25 MG/ML injection   Intramuscular   Inject 25 mg into  the muscle every 14 (fourteen) days. No instruction given         . gabapentin (NEURONTIN) 300 MG capsule   Oral   Take 1 capsule (300 mg total) by mouth 3 (three) times daily.   270 capsule   3   . gemfibrozil (LOPID) 600 MG tablet   Oral   Take 1 tablet (600 mg total) by mouth 2 (two) times daily.   180 tablet   3   . ibuprofen (ADVIL,MOTRIN) 600 MG tablet      take 1 tablet by mouth twice a day if needed for pain   60 tablet   1   . lisinopril-hydrochlorothiazide (PRINZIDE,ZESTORETIC) 20-25 MG per tablet   Oral   Take 1 tablet by mouth every morning.         . metFORMIN (GLUCOPHAGE) 500 MG tablet   Oral   Take 500-10,000 mg by mouth 2 (two) times daily with a meal. 2 tabs by mouth qam and 1 tab by mouth qhs         . niacin (NIASPAN) 750 MG CR tablet   Oral   Take 750 mg by mouth at bedtime.         Marland Kitchen omeprazole (PRILOSEC) 40 MG capsule  Oral   Take 40 mg by mouth every morning.         Marland Kitchen oxybutynin (DITROPAN) 5 MG tablet   Oral   Take 5 mg by mouth daily.         Marland Kitchen oxyCODONE-acetaminophen (PERCOCET) 5-325 MG per tablet   Oral   Take 2 tablets by mouth every 4 (four) hours as needed for pain.   8 tablet   0     SpO2 96%  Physical Exam  Nursing note and vitals reviewed. Constitutional: She appears well-developed and well-nourished. No distress.  HENT:  Head: Normocephalic and atraumatic.  Generally poor dentition  Eyes: Conjunctivae are normal. Pupils are equal, round, and reactive to light. Right eye exhibits no discharge. Left eye exhibits no discharge.  Neck: Neck supple.  Cardiovascular: Normal rate, regular rhythm and normal heart sounds.  Exam reveals no gallop and no friction rub.   No murmur heard. Pulmonary/Chest: Effort normal and breath sounds normal. No respiratory distress.  Abdominal: Soft. She exhibits no distension and no mass. There is tenderness. There is no rebound and no guarding.  Lower abdominal tenderness without  rebound or guarding. No surgical scars noted.  Musculoskeletal: She exhibits no edema and no tenderness.  Neurological:  Drowsy but arousable to voice. Answering questions appropriately. Hx of schizophrenia and odd affect, but this appears to be her norm. Some repetitive lip movements, but this is normal per her sister at bedside. Mild right-sided ptosis, but this is chronic per her sister as well. Cranial nerves otherwise intact. Strength is 5 out of 5 bilateral upper lower extremities. Sensation intact to light touch. Inferior to nose testing bilaterally.  Skin: Skin is warm and dry.  Psychiatric: She has a normal mood and affect. Her behavior is normal. Thought content normal.    ED Course  Procedures (including critical care time)  CRITICAL CARE Performed by: Raeford Razor   Total critical care time: 60 minutes  Critical care time was exclusive of separately billable procedures and treating other patients.  Critical care was necessary to treat or prevent imminent or life-threatening deterioration.  Critical care was time spent personally by me on the following activities: development of treatment plan with patient and/or surrogate as well as nursing, discussions with consultants, evaluation of patient's response to treatment, examination of patient, obtaining history from patient or surrogate, ordering and performing treatments and interventions, ordering and review of laboratory studies, ordering and review of radiographic studies, pulse oximetry and re-evaluation of patient's condition.   Labs Reviewed  CBC WITH DIFFERENTIAL - Abnormal; Notable for the following:    WBC 14.8 (*)    HCT 35.3 (*)    MCHC 37.4 (*)    Platelets 404 (*)    Neutro Abs 10.9 (*)    All other components within normal limits  COMPREHENSIVE METABOLIC PANEL - Abnormal; Notable for the following:    Sodium 117 (*)    Potassium 3.1 (*)    Chloride 73 (*)    CO2 18 (*)    Glucose, Bld 158 (*)    BUN 98  (*)    Creatinine, Ser 7.77 (*)    AST 44 (*)    ALT 40 (*)    Alkaline Phosphatase 140 (*)    GFR calc non Af Amer 5 (*)    GFR calc Af Amer 6 (*)    All other components within normal limits  URINALYSIS, ROUTINE W REFLEX MICROSCOPIC - Abnormal; Notable for the following:  APPearance TURBID (*)    Glucose, UA 100 (*)    Hgb urine dipstick MODERATE (*)    Bilirubin Urine MODERATE (*)    Protein, ur >300 (*)    Nitrite POSITIVE (*)    Leukocytes, UA MODERATE (*)    All other components within normal limits  URINE MICROSCOPIC-ADD ON - Abnormal; Notable for the following:    Bacteria, UA MANY (*)    All other components within normal limits  CULTURE, BLOOD (ROUTINE X 2)  CULTURE, BLOOD (ROUTINE X 2)  URINE CULTURE  TROPONIN I  CG4 I-STAT (LACTIC ACID)   Ct Head Wo Contrast  01/19/2013  *RADIOLOGY REPORT*  Clinical Data: Lethargy and acute mental status changes after a dose of Prolixin.  CT HEAD WITHOUT CONTRAST  Technique:  Contiguous axial images were obtained from the base of the skull through the vertex without contrast.  Comparison: None.  Findings: Ventricular system normal in size and appearance for age. No mass lesion.  No midline shift.  No acute hemorrhage or hematoma.  No extra-axial fluid collections.  No evidence of acute infarction.  No focal brain parenchymal abnormalities.  Partially calcified round lesion in the right frontal scalp near the vertex which does not appear to arise from the skull. Visualized paranasal sinuses, mastoid air cells, and middle ear cavities well-aerated.  IMPRESSION:  1.  Normal intracranially. 2.  Probable calcified sebaceous cyst in the right frontal scalp near the vertex.   Original Report Authenticated By: Hulan Saas, M.D.    Dg Chest Portable 1 View  01/20/2013  *RADIOLOGY REPORT*  Clinical Data: Right-sided line placement.  PORTABLE CHEST - 1 VIEW  Comparison: 01/19/2013 at 2104 hours  Findings: Interval placement of a right central  venous catheter with tip overlying the mid SVC.  No pneumothorax.  Mediastinal contours appear intact.  Shallow inspiration.  Atelectasis in the lung bases.  Normal heart size and pulmonary vascularity. Degenerative changes in the spine and shoulders.  IMPRESSION: Right central venous catheter tip overlies the SVC.  No pneumothorax.  Otherwise stable appearance of the chest.   Original Report Authenticated By: Burman Nieves, M.D.    Dg Chest Portable 1 View  01/19/2013  *RADIOLOGY REPORT*  Clinical Data: Acute mental status changes.  Hypotension.  PORTABLE CHEST - 1 VIEW 01/19/2013 2104 hours:  Comparison: One-view chest x-ray 01/02/2013.  Findings: Suboptimal inspiration which accounts for atelectasis at the bases and accentuates the cardiac silhouette.  Taking this into account, cardiac silhouette mildly enlarged but stable.  Lungs otherwise clear.  Pulmonary vascularity normal.  IMPRESSION: Suboptimal inspiration accounts for bibasilar atelectasis.  No acute cardiopulmonary disease otherwise.  Stable cardiomegaly without pulmonary edema.   Original Report Authenticated By: Hulan Saas, M.D.     EKG:  Rhythm: Normal sinus rhythm Rate: 87 Intervals: NS intraventricular delay Left ventricular hypertrophy Abnormal R wave progression Axis: Left ST segments: Normal   1. Severe sepsis with septic shock   2. UTI (urinary tract infection)   3. ARF (acute renal failure)   4. Hyponatremia       MDM  53:24 PM 57 year old female with decreased mental status. Noted to be significantly hypotensive. Concern for possible sepsis. Second IV line was established. Bolus IV fluids. Will consider empiric antibiotics if pressure doesn't respond. Pan culture. Chest x-ray. CT head although pt seems to be at her baseline aside from her mental status. Patient relates symptoms to possible Prolixin injections, but my suspicion for this is relatively low. She's tolerated this  in the past without similar effects  and this doesn't explain her hypotension. Will continue closely monitor.    Severe sepsis. Pt now with MAPs >65 after 4L IVF though.Central access established under my supervision. UA consistent with UTI. When discussing further with sister about recent UTI diagnosis she replied she didn't think she was taking abx. Cultures reviewed. E. Coli. Zosyn ordered and sensitive to this. ARF, hyponatremia. Family Medicine patient. Critically ill, but hypotension IVF responsive.  Discussed with CCM, because now MAPs 60-65.  Norepinephrine started.         Raeford Razor, MD 01/20/13 [facility] Manners

## 2013-01-20 ENCOUNTER — Inpatient Hospital Stay (HOSPITAL_COMMUNITY): Payer: Medicare Other

## 2013-01-20 DIAGNOSIS — N390 Urinary tract infection, site not specified: Secondary | ICD-10-CM

## 2013-01-20 DIAGNOSIS — R310 Gross hematuria: Secondary | ICD-10-CM

## 2013-01-20 DIAGNOSIS — E1142 Type 2 diabetes mellitus with diabetic polyneuropathy: Secondary | ICD-10-CM

## 2013-01-20 DIAGNOSIS — N179 Acute kidney failure, unspecified: Secondary | ICD-10-CM

## 2013-01-20 DIAGNOSIS — A419 Sepsis, unspecified organism: Secondary | ICD-10-CM

## 2013-01-20 DIAGNOSIS — E871 Hypo-osmolality and hyponatremia: Secondary | ICD-10-CM

## 2013-01-20 DIAGNOSIS — E1149 Type 2 diabetes mellitus with other diabetic neurological complication: Secondary | ICD-10-CM

## 2013-01-20 DIAGNOSIS — R6521 Severe sepsis with septic shock: Secondary | ICD-10-CM

## 2013-01-20 HISTORY — DX: Gross hematuria: R31.0

## 2013-01-20 LAB — PROTIME-INR
INR: 1.14 (ref 0.00–1.49)
Prothrombin Time: 14.4 seconds (ref 11.6–15.2)

## 2013-01-20 LAB — BASIC METABOLIC PANEL
CO2: 16 mEq/L — ABNORMAL LOW (ref 19–32)
CO2: 19 mEq/L (ref 19–32)
Chloride: 90 mEq/L — ABNORMAL LOW (ref 96–112)
Chloride: 97 mEq/L (ref 96–112)
Creatinine, Ser: 2.93 mg/dL — ABNORMAL HIGH (ref 0.50–1.10)
GFR calc Af Amer: 8 mL/min — ABNORMAL LOW (ref >90)
Potassium: 4.4 mEq/L (ref 3.5–5.1)
Potassium: 2.6 mEq/L — CL (ref 3.5–5.1)
Sodium: 123 mEq/L — ABNORMAL LOW (ref 135–145)
Sodium: 131 mEq/L — ABNORMAL LOW (ref 135–145)

## 2013-01-20 LAB — GLUCOSE, CAPILLARY
Glucose-Capillary: 132 mg/dL — ABNORMAL HIGH (ref 70–99)
Glucose-Capillary: 102 mg/dL — ABNORMAL HIGH (ref 70–99)
Glucose-Capillary: 118 mg/dL — ABNORMAL HIGH (ref 70–99)

## 2013-01-20 LAB — CREATININE, SERUM: GFR calc non Af Amer: 7 mL/min — ABNORMAL LOW (ref >90)

## 2013-01-20 LAB — APTT: aPTT: 28 seconds (ref 24–37)

## 2013-01-20 LAB — CBC
Hemoglobin: 11.1 g/dL — ABNORMAL LOW (ref 12.0–15.0)
MCV: 82.4 fL (ref 78.0–100.0)
Platelets: 340 10*3/uL (ref 150–400)
RBC: 3.7 MIL/uL — ABNORMAL LOW (ref 3.87–5.11)
WBC: 11.7 10*3/uL — ABNORMAL HIGH (ref 4.0–10.5)

## 2013-01-20 LAB — CARBOXYHEMOGLOBIN
Carboxyhemoglobin: 1.2 % (ref 0.5–1.5)
Methemoglobin: 1.1 % (ref 0.0–1.5)
O2 Saturation: 62.5 %
O2 Saturation: 75.1 %
Total hemoglobin: 10.1 g/dL — ABNORMAL LOW (ref 12.0–16.0)

## 2013-01-20 LAB — CORTISOL: Cortisol, Plasma: 20.3 ug/dL

## 2013-01-20 LAB — PROCALCITONIN: Procalcitonin: 0.21 ng/mL

## 2013-01-20 LAB — FIBRINOGEN: Fibrinogen: 392 mg/dL (ref 204–475)

## 2013-01-20 MED ORDER — INSULIN ASPART 100 UNIT/ML ~~LOC~~ SOLN
1.0000 [IU] | SUBCUTANEOUS | Status: DC
  Administered 2013-01-20: 2 [IU] via SUBCUTANEOUS
  Administered 2013-01-20: 1 [IU] via SUBCUTANEOUS

## 2013-01-20 MED ORDER — SODIUM CHLORIDE 0.9 % IV SOLN
250.0000 mL | INTRAVENOUS | Status: DC | PRN
  Administered 2013-01-21: 250 mL via INTRAVENOUS

## 2013-01-20 MED ORDER — PNEUMOCOCCAL VAC POLYVALENT 25 MCG/0.5ML IJ INJ
0.5000 mL | INJECTION | INTRAMUSCULAR | Status: AC
Start: 2013-01-21 — End: 2013-01-21
  Administered 2013-01-21: 0.5 mL via INTRAMUSCULAR
  Filled 2013-01-20 (×2): qty 0.5

## 2013-01-20 MED ORDER — NOREPINEPHRINE BITARTRATE 1 MG/ML IJ SOLN: 5.0000 ug/min | INTRAVENOUS | Status: DC

## 2013-01-20 MED ORDER — SODIUM CHLORIDE 0.9 % IV BOLUS (SEPSIS)
2000.0000 mL | Freq: Once | INTRAVENOUS | Status: AC
  Administered 2013-01-20: 2000 mL via INTRAVENOUS

## 2013-01-20 MED ORDER — HEPARIN SODIUM (PORCINE) 5000 UNIT/ML IJ SOLN
5000.0000 [IU] | Freq: Three times a day (TID) | INTRAMUSCULAR | Status: DC
  Administered 2013-01-20 – 2013-01-24 (×14): 5000 [IU] via SUBCUTANEOUS
  Filled 2013-01-20 (×16): qty 1

## 2013-01-20 MED ORDER — DEXTROSE 5 % IV SOLN
2.0000 g | INTRAVENOUS | Status: DC
  Administered 2013-01-21 – 2013-01-22 (×2): 2 g via INTRAVENOUS
  Filled 2013-01-20 (×2): qty 2

## 2013-01-20 MED ORDER — SODIUM CHLORIDE 0.9 % IV BOLUS (SEPSIS): 1000.0000 mL | INTRAVENOUS | Status: DC | PRN

## 2013-01-20 MED ORDER — POTASSIUM CHLORIDE CRYS ER 20 MEQ PO TBCR
40.0000 meq | EXTENDED_RELEASE_TABLET | Freq: Once | ORAL | Status: AC
  Administered 2013-01-20: 40 meq via ORAL
  Filled 2013-01-20: qty 2

## 2013-01-20 MED ORDER — OXYCODONE-ACETAMINOPHEN 5-325 MG PO TABS
1.0000 | ORAL_TABLET | ORAL | Status: DC | PRN
  Administered 2013-01-20: 1 via ORAL
  Filled 2013-01-20 (×4): qty 1

## 2013-01-20 MED ORDER — DEXTROSE 5 % IV SOLN
2.0000 g | Freq: Once | INTRAVENOUS | Status: AC
  Administered 2013-01-20: 2 g via INTRAVENOUS
  Filled 2013-01-20: qty 2

## 2013-01-20 MED ORDER — SODIUM CHLORIDE 0.9 % IV SOLN
INTRAVENOUS | Status: DC
  Administered 2013-01-20 – 2013-01-21 (×3): via INTRAVENOUS
  Administered 2013-01-21: 1000 mL via INTRAVENOUS
  Administered 2013-01-21 – 2013-01-22 (×2): via INTRAVENOUS

## 2013-01-20 MED ORDER — SODIUM CHLORIDE 0.9 % IV SOLN
INTRAVENOUS | Status: AC
  Administered 2013-01-20 (×3): via INTRAVENOUS

## 2013-01-20 NOTE — H&P (Signed)
Family Medicine Teaching Service [facility] Admission History and Physical-->Patient to be admitted to CCM Service Pager: [phone]  Patient name: Krista [name] Medical record number: [id number] Date of birth: 08/07/1956 Age: 57 y.o. Gender: female  Primary Care Provider: Ardyth Gal, MD  Chief Complaint: altered mental status  Assessment and Plan: Krista [name] is a 57 y.o. year old female presenting with altered mental status found to be in severe sepsis.   # Severe Sepsis/?septic shock-upon review of patient's data and examining patient initially, I contacted E-Link Dr. Marchelle Gearing with concern that patient may not be appropriate for stepdown level of care. Discussed with EDP who also had new concerns about stepdown as appropriate given several MAPs <65 despite 4L of fluid. Spoke with Dr. Kendrick Fries in ED and patient is to be taken to ICU for further care.   *started in zosyn in ED. Suspect coverage may be broadened initially although likely source is untreated UTI (known E. Coli)   *consider lactic acid, procalcitonin, cortisol level.   * levophed for MAPs >65  *sepsis is likely etiology of altered mental status/encephalopathy. Patient with mental health disorder (appears to be either schizophrenia or bipolar from chart but regardless has psychotic featuress) and would hold sedating medications including pain medications. CT head withotu bleed.   *we will continue to follow along with patient in the ICU and are prepared to care for her when transitions to stepdown or floor status.   # AKI-suspect prerenal in etiology. Patient now s/p 4L of fluid and will likely need further resuscitation. Given patient's poor quality of life at home and dependency on family, patient would not likely be a dialysis candidate so certainly hope renal function improves with fluid resuscitation. Renal failure likely cause of metabolic acidosis.   *hold metformin and lisinopril  # Hyponatremia-concern patient  may have had poor PO for several weeks now given hyponatremia to 120 in ED on 3/2 now worsened to 117. Expect some improvement with hydration. Suspect hypovolemic hyponatremia.   #Hypokalemia-repleted in ED with 3 runs, will need #DM-sliding scale only. Hold metformin.  #Hypertension-hold all BP meds. Hold ASA until improved acute illness.  #tobacco abuse-may need nicotine patch  1. Disposition-Admit to [facility] service.  2. Code Status-will need to discuss with family. No notes to indicate DNR/DNI  History of Present Illness: Krista [name] is a 57 y.o. year old female presenting with altered mental status.   Level 5 caveat applies. History provided by sister who does not live with patient. Patient lives with her mother and brother and is dependent on them for her care. 2 weeks ago, patient able to do activities of daily living such as dress herself and feed herself. On 01/12/13, patient received her Prolixin injection at mental health. Family notes that within next 1-2 days, patient became progressively less interactive and began to lay around the house more and more. Eventually she reached the point where she only responded yes or no with head nods. She had minimal PO intake of fluids or food during this time. She was incontinent of urine. Sister reports she had complained of burning with peeing for several weeks and had seen a physician (urgent care/ED? Sister uncertain) but never taken the antibiotic. From records, appears she was seen in ED on 3/2 and given CTX and sent home on Cipro. Urine culture from that time shows e coli sensitive to ciprofloxacin. The only time patient responded to me was when I asked if she is taking pain medications and she  nods her head yes and holds up 3 fingers.   Sister denies hearing complaints of shortness of breath, fever, chest pain, back pain changed from patient's baseline. Does endorse some loose stools. Patient has refused almost all pills this week as well.    In the ED, patient was found to have UA with nitrites, mod leuk, many bacteria, WBC 15k, NA 117 (down from 120 22 weeks ago and 130 3 months ago), metabolic acidosis with bicarb 18, Cr 7.777, 1 troponin negative, CXR with bibasilar atelectasis, and CT head negative for bleed. Patient had a central line placed and 4L of fluid given after initial MAPs <65. Had some initial response to this above 41 and family medicine was paged for admission.   Patient Active Problem List  Diagnosis  . DIABETES MELLITUS, TYPE II, WITH RETINOPATHY  . HYPERTRIGLYCERIDEMIA  . HYPERLIPIDEMIA  . OBESITY, NOS  . PSYCHOSIS, UNSPECIFIED  . TOBACCO DEPENDENCE  . RETINOPATHY, DIABETIC, BACKGROUND  . HYPERTENSION, BENIGN SYSTEMIC  . SINUS TACHYCARDIA  . GASTROESOPHAGEAL REFLUX, NO ESOPHAGITIS  . HERNIA, HIATAL, NONCONGENITAL  . 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 DEGENERATIVE JOINT DISEASE, BOTH KNEES, SEVERE  . INCONTINENCE, URGE  . SACROILIITIS, RIGHT  . Wheezing symptom  . Dysuria  . Diabetic peripheral neuropathy associated with type 2 diabetes mellitus  . Back pain  . Rectal trauma   Past Medical History: Past Medical History  Diagnosis Date  . Arthritis   . Diabetes mellitus   . Hyperlipidemia   . Hypertension   . Allergy   . GERD (gastroesophageal reflux disease)   . Back pain    Past Surgical History: History reviewed. No pertinent past surgical history. Social History: History  Substance Use Topics  . Smoking status: Current Every Day Smoker -- 1.00 packs/day    Types: Cigarettes  . Smokeless tobacco: Never Used  . Alcohol Use: No   For any additional social history documentation, please refer to relevant sections of EMR.  Family History: Family History  Problem Relation Age of Onset  . Diabetes Mother   . COPD Mother   . Heart disease Mother   . Hyperlipidemia Mother   . Hypertension Mother   . Arthritis Mother    Allergies: Allergies  Allergen Reactions   . Codeine     REACTION: itching   No current facility-administered medications on file prior to encounter.   Current Outpatient Prescriptions on File Prior to Encounter  Medication Sig Dispense Refill  . benztropine (COGENTIN) 1 MG tablet Take 1 tablet (1 mg total) by mouth at bedtime.  90 tablet  3  . ciprofloxacin (CIPRO) 500 MG tablet Take 1 tablet (500 mg total) by mouth every 12 (twelve) hours.  20 tablet  0  . fluPHENAZine decanoate (PROLIXIN) 25 MG/ML injection Inject 25 mg into the muscle every 14 (fourteen) days. No instruction given      . gabapentin (NEURONTIN) 300 MG capsule Take 1 capsule (300 mg total) by mouth 3 (three) times daily.  270 capsule  3  . gemfibrozil (LOPID) 600 MG tablet Take 1 tablet (600 mg total) by mouth 2 (two) times daily.  180 tablet  3  . ibuprofen (ADVIL,MOTRIN) 600 MG tablet take 1 tablet by mouth twice a day if needed for pain  60 tablet  1  . lisinopril-hydrochlorothiazide (PRINZIDE,ZESTORETIC) 20-25 MG per tablet Take 1 tablet by mouth every morning.      . metFORMIN (GLUCOPHAGE) 500 MG tablet Take 500-10,000 mg by mouth 2 (  two) times daily with a meal. 2 tabs by mouth qam and 1 tab by mouth qhs      . niacin (NIASPAN) 750 MG CR tablet Take 750 mg by mouth at bedtime.      Marland Kitchen omeprazole (PRILOSEC) 40 MG capsule Take 40 mg by mouth every morning.      Marland Kitchen oxybutynin (DITROPAN) 5 MG tablet Take 5 mg by mouth 2 (two) times daily.       Marland Kitchen oxyCODONE-acetaminophen (PERCOCET) 5-325 MG per tablet Take 2 tablets by mouth every 4 (four) hours as needed for pain.  8 tablet  0   Review Of Systems: Per HPI  Otherwise 12 point review of systems was performed and was unremarkable.  Physical Exam: BP 94/48  Pulse 76  Temp(Src) 97.2 F (36.2 C) (Rectal)  Resp 22  SpO2 100% Exam: Gen: disheveled, minimal responses, tongue protruding, poor skin turgor, poor capillary refill HEENT: extremely dry mucus membranes CV: RRR no mrg  Lungs: CTAB anteriorly Abd:  soft/nontender/nondistended/normal bowel sounds  MSK: moves all extremities, no edema  Skin: warm and dry, no rash  Neuro: minimal responses, does not respond to orientation questions.    Labs and Imaging: CBC BMET   Recent Labs Lab 01/19/13 2114  WBC 14.8*  HGB 13.2  HCT 35.3*  PLT 404*    Recent Labs Lab 01/19/13 2114  NA 117*  K 3.1*  CL 73*  CO2 18*  BUN 98*  CREATININE 7.77*  GLUCOSE 158*  CALCIUM 9.3     See lab summary in HPI  Tana Conch, MD 01/20/2013, 12:37 AM

## 2013-01-20 NOTE — Progress Notes (Signed)
eLink Physician-Brief Progress Note Patient Name: Krista [name] DOB: 1956-10-24 MRN: [id number]  Date of Service  01/20/2013   HPI/Events of Note    Recent Labs Lab 01/19/13 2354  LATICACIDVEN 1.37   Patient admitted as code sepsis related to urinary tract infection and renal failure. Currently hemoglobin 10.1. Mixed venous saturation 62.5. CVP 10. Mean arterial pressure 73. Current heart rate 70. Looks okay on clinical exam to the camera. She has had total of 5.5 L of normal saline. Currently on Levophed 5 mcg per minute and the dose is being weaned down   Of note, admitting physician Dr. Kendrick [name] thought clinically patient was extremely dehydrated   eICU Interventions   plan  -CVP goal greater than 12 with the help of fluids  - If after obtaining CVP goal greater than 12 patient still on Levophed then recheck mixed venous saturation to decide on dobutamine    Intervention Category Major Interventions: Sepsis - evaluation and management;Other:  Krista [name] 01/20/2013, 3:52 AM

## 2013-01-20 NOTE — Progress Notes (Signed)
Utilization review completed.  P.J. Kosha Jaquith,RN,BSN Case Manager [phone]  

## 2013-01-20 NOTE — H&P (Addendum)
PULMONARY  / CRITICAL CARE MEDICINE  Name: Krista [name] MRN: [id number] DOB: 12-May-1956    ADMISSION DATE:  01/19/2013 CONSULTATION DATE:  01/20/2013  REFERRING MD :  Juleen China, EDP  CHIEF COMPLAINT:  Septic shock  BRIEF PATIENT DESCRIPTION: 57 y/o female with schizophrenia and DM2 was admitted on 3/20 with septic shock from a urinary tract infection.  SIGNIFICANT EVENTS / STUDIES:    LINES / TUBES: 3/19 R IJ CVL (EDP) >>  CULTURES: 3/19 Blood>> 3/19 urine >>  ANTIBIOTICS: 3/19 zosyn x1 3/19 ceftriaxone >>   HISTORY OF PRESENT ILLNESS:  57 y/o female with schizophrenia and DM2 was admitted on 3/20 with septic shock from a urinary tract infection.  Her family states that two weeks prior to admission she was seen by her PCP who treated her with antibiotics for a UTI.  However the patient did not take the antibiotics.  She developed progressive lethargy, anorexia, and lack of interaction with family.  They brought her to the ED on 3/19 because of progressive symptoms.  She was found to be in shock which did not respond appropriately to 4 L normal saline.  PCCM consulted for admission.  PAST MEDICAL HISTORY :  Past Medical History  Diagnosis Date  . Arthritis   . Diabetes mellitus   . Hyperlipidemia   . Hypertension   . Allergy   . GERD (gastroesophageal reflux disease)   . Back pain    History reviewed. No pertinent past surgical history. Prior to Admission medications   Medication Sig Start Date End Date Taking? Authorizing Provider  aspirin EC 81 MG tablet Take 81 mg by mouth daily.   Yes Historical Provider, MD  benztropine (COGENTIN) 1 MG tablet Take 1 tablet (1 mg total) by mouth at bedtime. 02/13/12  Yes Ardyth Gal, MD  ciprofloxacin (CIPRO) 500 MG tablet Take 1 tablet (500 mg total) by mouth every 12 (twelve) hours. 01/02/13  Yes Richardean Canal, MD  fluPHENAZine decanoate (PROLIXIN) 25 MG/ML injection Inject 25 mg into the muscle every 14 (fourteen) days. No  instruction given   Yes Historical Provider, MD  gabapentin (NEURONTIN) 300 MG capsule Take 1 capsule (300 mg total) by mouth 3 (three) times daily. 02/13/12  Yes Ardyth Gal, MD  gemfibrozil (LOPID) 600 MG tablet Take 1 tablet (600 mg total) by mouth 2 (two) times daily. 07/30/12  Yes Ardyth Gal, MD  ibuprofen (ADVIL,MOTRIN) 600 MG tablet take 1 tablet by mouth twice a day if needed for pain 01/11/13  Yes Ardyth Gal, MD  lisinopril-hydrochlorothiazide [facility]) 20-25 MG per tablet Take 1 tablet by mouth every morning. 07/30/12  Yes Ardyth Gal, MD  metFORMIN (GLUCOPHAGE) 500 MG tablet Take 500-10,000 mg by mouth 2 (two) times daily with a meal. 2 tabs by mouth qam and 1 tab by mouth qhs 07/30/12  Yes Ardyth Gal, MD  niacin (NIASPAN) 750 MG CR tablet Take 750 mg by mouth at bedtime. 04/20/12  Yes Ardyth Gal, MD  omeprazole (PRILOSEC) 40 MG capsule Take 40 mg by mouth every morning. 02/13/12  Yes Ardyth Gal, MD  oxybutynin (DITROPAN) 5 MG tablet Take 5 mg by mouth 2 (two) times daily.  02/16/12  Yes Ardyth Gal, MD  oxyCODONE-acetaminophen (PERCOCET) 5-325 MG per tablet Take 2 tablets by mouth every 4 (four) hours as needed for pain. 01/02/13  Yes Richardean Canal, MD   Allergies  Allergen Reactions  . Codeine     REACTION: itching    FAMILY HISTORY:  Family History  Problem Relation Age of Onset  . Diabetes Mother   . COPD Mother   . Heart disease Mother   . Hyperlipidemia Mother   . Hypertension Mother   . Arthritis Mother    SOCIAL HISTORY:  reports that she has been smoking Cigarettes.  She has been smoking about 1.00 pack per day. She has never used smokeless tobacco. She reports that she does not drink alcohol. Her drug history is not on file.  REVIEW OF SYSTEMS:  Cannot obtain due to confusion  SUBJECTIVE:   VITAL SIGNS: Temp:  [97.2 F (36.2 C)-97.5 F (36.4 C)] 97.2 F (36.2 C) (03/19 2141) Pulse Rate:  [75-87]  76 (03/19 2345) Resp:  [16-26] 22 (03/19 2345) BP: (70-105)/(33-56) 94/48 mmHg (03/19 2345) SpO2:  [94 %-100 %] 100 % (03/19 2345) HEMODYNAMICS:   VENTILATOR SETTINGS:   INTAKE / OUTPUT: Intake/Output   None     PHYSICAL EXAMINATION:  Gen: lethargic but pleasant mood, no distress HEENT: NCAT, PERRL, EOMi, MM dry PULM: CTA B CV: RRR, no mgr, no JVD AB: BS+, soft, nontender, no hsm Ext: warm, no edema, no clubbing, no cyanosis Derm: no rash or skin breakdown Neuro: Awake and alert, moves all four extremities, answers questions with one word responses   LABS:  Recent Labs Lab 01/19/13 2114 01/19/13 2354  HGB 13.2  --   WBC 14.8*  --   PLT 404*  --   NA 117*  --   K 3.1*  --   CL 73*  --   CO2 18*  --   GLUCOSE 158*  --   BUN 98*  --   CREATININE 7.77*  --   CALCIUM 9.3  --   AST 44*  --   ALT 40*  --   ALKPHOS 140*  --   BILITOT 0.9  --   PROT 6.9  --   ALBUMIN 3.5  --   LATICACIDVEN  --  1.37  TROPONINI <0.30  --    No results found for this basename: GLUCAP,  in the last 168 hours  3/19 CXR: R IJ in place, lungs clear  ASSESSMENT / PLAN:  PULMONARY A: No acute issues Tobacco abuse P:   -monitor O2 saturation -counsel to quit tobacco  CARDIOVASCULAR A: Septic shock from a urinary source; hypovolemic from poor po intake P:  -sepsis protocol, EGDT -Needs more IVF  RENAL A:  Acute renal failure from sepsis and hypovolemia from poor po intake History of nephrolithiasis; no back pain currently (her usual complaint with nephrolithiasis) Poor candidate for chronic hemodialysis (has to live with family for ADL's, has never lived independently) Hyponatremia in setting of profound volume depletion P:   -aggressive IVF -monitor UOP -repeat BMET -renal ultrasound -image for renal stone if worsening sepsis/shock -monitor BMET to ensure Na not rising too quickly, but at this point considering clear volume depletion needs isotonic resuscitation until  euvolemic  GASTROINTESTINAL A:  No acute issues P:   npo  HEMATOLOGIC A:  No acute issues P:    INFECTIOUS A:  Septic shock due to a urinary source; no flank or back pain so do not suspect obstructive uropathy/struvite stone but if worsening shock needs CT imaging of stone P:   -CT abdomen if worsening shock -ceftriaxone (e.coli sensitive to ceftriaxone) -f/u cultures  ENDOCRINE A:  DM2 P:   -ICU hyperglycemia protocol  NEUROLOGIC A:  Delirium due to sepsis, renal failure and multiple sedating meds at home Schizophrenia P:   -hold  home meds 3/20, restart as condition improves   TODAY'S SUMMARY:   I have personally obtained a history, examined the patient, evaluated laboratory and imaging results, formulated the assessment and plan and placed orders. CRITICAL CARE: The patient is critically ill with multiple organ systems failure and requires high complexity decision making for assessment and support, frequent evaluation and titration of therapies, application of advanced monitoring technologies and extensive interpretation of multiple databases. Critical Care Time devoted to patient care services described in this note is 60 minutes.   Fonnie Jarvis Pulmonary and Critical Care Medicine [facility] Pager: [phone]  01/20/2013, 12:43 AM

## 2013-01-20 NOTE — Progress Notes (Signed)
FMTS Attending Daily Note: Krista Vanecek MD [phone] pager office [phone] I  have seen and examined this patient, reviewed their chart. I have discussed this patient with the resident. I agree with the resident's findings, assessment and care plan. 

## 2013-01-20 NOTE — ED Provider Notes (Signed)
Medical screening examination/treatment/procedure(s) were conducted as a shared visit with non-physician practitioner(s) and myself.  I personally evaluated the patient during the encounter.  I was present for the entirety of this procedure and assisted.  Raeford Razor, MD 01/20/13 [phone]

## 2013-01-20 NOTE — Progress Notes (Signed)
Primary Care Provider Note:   Ms. Weedman is a 57 year old female with MMP including DM, tobacco abuse, substance abuse, Bipolar I disorder, who I saw two weeks ago in clinic for a UTI.  Keflex was prescribed and urine culture grew E. Coli sensitive to 1st generation cephalosporins.  Ms. Morrissey tells me today that she did get the keflex filled but only took it a few days.  She has told other care providers a different story, unclear what is true.   [facility]rolixin) and when she got home she was acting confused so her mom felt she needed to come to the ER.   She was found to be septic with a UTI, with acute kidney failure, and was admitted to the ICU.  I appreciate both CCM and FMTS care of my patient.  I suspect that kidney failure is complicated by NSAID over use as she takes multiple doses most days.  I will continue to follow her hospital course, please contact me if I can be of assistance.   Kaizen Ibsen 01/20/2013 11:58 AM

## 2013-01-20 NOTE — Progress Notes (Signed)
ANTIBIOTIC CONSULT NOTE - INITIAL  Pharmacy Consult for ceftriaxone Indication: R/o sepsis  Allergies  Allergen Reactions  . Codeine     REACTION: itching    Patient Measurements:    Vital Signs: Temp: 97.2 F (36.2 C) (03/19 2141) Temp src: Rectal (03/19 2141) BP: 80/54 mmHg (03/20 0230) Pulse Rate: 74 (03/20 0200) Intake/Output from previous day: 03/19 0701 - 03/20 0700 In: 5368.8 [I.V.:168.8; IV Piggyback:200] Out: -  Intake/Output from this shift: Total I/O In: 5368.8 [I.V.:168.8; Other:5000; IV Piggyback:200] Out: -   Labs:  Recent Labs  01/19/13 2114  WBC 14.8*  HGB 13.2  PLT 404*  CREATININE 7.77*   The CrCl is unknown because both a height and weight (above a minimum accepted value) are required for this calculation. No results found for this basename: VANCOTROUGH, Leodis Binet, VANCORANDOM, GENTTROUGH, GENTPEAK, GENTRANDOM, TOBRATROUGH, TOBRAPEAK, TOBRARND, AMIKACINPEAK, AMIKACINTROU, AMIKACIN,  in the last 72 hours   Microbiology: Recent Results (from the past 720 hour(s))  URINE CULTURE     Status: None   Collection Time    01/02/13 11:13 AM      Result Value Range Status   Specimen Description URINE, CLEAN CATCH   Final   Special Requests NONE   Final   Culture  Setup Time 01/02/2013 18:03   Final   Colony Count >=100,000 COLONIES/ML   Final   Culture ESCHERICHIA COLI   Final   Report Status 01/04/2013 FINAL   Final   Organism ID, Bacteria ESCHERICHIA COLI   Final    Medical History: Past Medical History  Diagnosis Date  . Arthritis   . Diabetes mellitus   . Hyperlipidemia   . Hypertension   . Allergy   . GERD (gastroesophageal reflux disease)   . Back pain     Medications:  Scheduled:  . cefTRIAXone (ROCEPHIN)  IV  2 g Intravenous Once  . heparin  5,000 Units Subcutaneous Q8H  . insulin aspart  1-3 Units Subcutaneous Q4H  . [COMPLETED] norepinephrine (LEVOPHED) Adult infusion  2-50 mcg/min Intravenous Once  . [COMPLETED]  piperacillin-tazobactam  3.375 g Intravenous Once  . potassium chloride  10 mEq Intravenous Q1 Hr x 3  . [COMPLETED] sodium chloride  2,000 mL Intravenous Once  . [COMPLETED] sodium chloride  2,000 mL Intravenous Once  . [DISCONTINUED] potassium chloride  10 mEq Intravenous Once   Assessment: 57 yo female admitted with septic shock due to urinary source (previously untreated E. coli UTI resistant only to ampicillin). Pharmacy to manage ceftriaxone.   Plan:  1. Ceftriaxone 2gm IV Q24H.   Emeline Gins 01/20/2013,3:34 AM

## 2013-01-20 NOTE — Progress Notes (Signed)
PULMONARY  / CRITICAL CARE MEDICINE  Name: Krista [name] MRN: [id number] DOB: 26-Jul-1956    ADMISSION DATE:  01/19/2013 CONSULTATION DATE:  01/20/2013  REFERRING MD :  Juleen China, EDP  CHIEF COMPLAINT:  Septic shock  BRIEF PATIENT DESCRIPTION: 57 y/o female with schizophrenia and DM2 was admitted on 3/20 with septic shock from a urinary tract infection.  SIGNIFICANT EVENTS / STUDIES: 3/20 off pressors    LINES / TUBES: 3/19 R IJ CVL (EDP) >>  CULTURES: 3/19 Blood>> 3/19 urine >>  ANTIBIOTICS: 3/19 zosyn x1 3/19 ceftriaxone >>   HISTORY OF PRESENT ILLNESS:  57 y/o female with schizophrenia and DM2 was admitted on 3/20 with septic shock from a urinary tract infection.  Her family states that two weeks prior to admission she was seen by her PCP who treated her with antibiotics for a UTI.  However the patient did not take the antibiotics.  She developed progressive lethargy, anorexia, and lack of interaction with family.  They brought her to the ED on 3/19 because of progressive symptoms.  She was found to be in shock which did not respond appropriately to 4 L normal saline.  PCCM consulted for admission.   SUBJECTIVE: denies CP, dyspnea 'sleepy' Feels better  VITAL SIGNS: Temp:  [97.2 F (36.2 C)-97.6 F (36.4 C)] 97.6 F (36.4 C) (03/20 1147) Pulse Rate:  [66-90] 90 (03/20 1100) Resp:  [10-26] 17 (03/20 1100) BP: (70-125)/(33-100) 86/68 mmHg (03/20 1100) SpO2:  [94 %-100 %] 97 % (03/20 1100) Weight:  [77 kg (169 lb 12.1 oz)] 77 kg (169 lb 12.1 oz) (03/20 0300) HEMODYNAMICS: CVP:  [1 mmHg-12 mmHg] 12 mmHg    INTAKE / OUTPUT: Intake/Output     03/19 0701 - 03/20 0700 03/20 0701 - 03/21 0700   I.V. (mL/kg) 881.6 (11.4) 641.4 (8.3)   Other 5000    IV Piggyback 2250    Total Intake(mL/kg) 8131.6 (105.6) 641.4 (8.3)   Urine (mL/kg/hr) 920 1050 (1.7)   Total Output 920 1050   Net +7211.6 -408.7        Urine Occurrence 1 x 1 x     PHYSICAL EXAMINATION:  Gen:  lethargic but pleasant mood, no distress HEENT: NCAT, PERRL, EOMi, MM dry PULM: CTA B CV: RRR, no mgr, no JVD AB: BS+, soft, nontender, no hsm Ext: warm, no edema, no clubbing, no cyanosis Derm: no rash or skin breakdown Neuro: Awake and alert, moves all four extremities, answers questions with one word responses   LABS:  Recent Labs Lab 01/19/13 2114 01/19/13 2354 01/20/13 0333  HGB 13.2  --  11.1*  WBC 14.8*  --  11.7*  PLT 404*  --  340  NA 117*  --  123*  K 3.1*  --  4.4  CL 73*  --  90*  CO2 18*  --  16*  GLUCOSE 158*  --  138*  BUN 98*  --  80*  CREATININE 7.77*  --  5.93*  5.86*  CALCIUM 9.3  --  7.4*  AST 44*  --   --   ALT 40*  --   --   ALKPHOS 140*  --   --   BILITOT 0.9  --   --   PROT 6.9  --   --   ALBUMIN 3.5  --   --   APTT  --   --  28  INR  --   --  1.14  LATICACIDVEN  --  1.37 0.5  TROPONINI <0.30  --   --  PROCALCITON  --   --  0.21    Recent Labs Lab 01/20/13 0332 01/20/13 0811 01/20/13 1146  GLUCAP 132* 102* 108*    3/19 CXR: R IJ in place, lungs clear  ASSESSMENT / PLAN:  PULMONARY A: Tobacco abuse P:   -monitor O2 saturation -counsel to quit tobacco  CARDIOVASCULAR A: Septic shock from a urinary source; hypovolemic P:  -dc sepsis protocol -Needs more IVF , CVp was 3  RENAL A:  Acute renal failure from sepsis and hypovolemia from poor po intake in setting on metformin/ lisinopril History of nephrolithiasis; no back pain currently (her usual complaint with nephrolithiasis) Poor candidate for chronic hemodialysis (has to live with family for ADL's, has never lived independently) Hyponatremia in setting of profound volume depletion -renal ultrasound - no hnosis P:   -aggressive IVF -monitor UOP -picking up - expect to get polyuric -repeat BMET -monitor BMET twice daily  to ensure Na not rising too quickly, but at this point considering clear volume depletion needs isotonic resuscitation until  euvolemic  GASTROINTESTINAL A:  No acute issues P:   Resume diet  HEMATOLOGIC A:  No acute issues P:    INFECTIOUS A:  Septic shock due to a urinary source; no flank or back pain so do not suspect obstructive uropathy/struvite stone but if worsening shock needs CT imaging of stone Unclear if she took prescribed keflex  P:   -ceftriaxone (e.coli sensitive to ceftriaxone) -f/u cultures  ENDOCRINE A:  DM2 P:   -ICU hyperglycemia protocol  NEUROLOGIC A:  Delirium due to sepsis, renal failure and multiple sedating meds at home Schizophrenia P:   -hold home meds 3/20, restart as condition improves   TODAY'S SUMMARY:   I have personally obtained a history, examined the patient, evaluated laboratory and imaging results, formulated the assessment and plan and placed orders. CRITICAL CARE: The patient is critically ill with multiple organ systems failure and requires high complexity decision making for assessment and support, frequent evaluation and titration of therapies, application of advanced monitoring technologies and extensive interpretation of multiple databases. Critical Care Time devoted to patient care services described in this note is 45 minutes.   [facility]MD Pulmonary and Critical Care Medicine [facility] Pager: [phone]  01/20/2013, 3:00 PM

## 2013-01-20 NOTE — H&P (Signed)
FMTS Attending Admission Note: Renold Don MD Personal pager:  [phone] FPTS Service Pager:  [phone]  I  have seen and examined this patient, reviewed their chart. I have discussed this patient with the resident. I agree with the resident's findings, assessment and care plan.  Briefly, 57 yo F seen here in clinic about 2 weeks ago, diagnosed with UTI and provided antibiotics but she did not take them.  Increasing lethargy at home since then until she was brought by family to ED last night.  Found to be septic, likely secondary to urinary source.  Not responding to 4L NS and therefore CCM called for admission.  Exam: Gen:  Awake and alert this AM HEENT:  Oral mucosa dry Heart:  RRR Lungs:  Clear  Imp/plan:   1.  Sepsis:  - Admitted to CCM service.  Apreciate care  2.  AKi: - Likely combination of both prerenal depetion and NSAID oversue (per her PCP)  3.  Hyponatremia:  - Slowly correcting.  Also likely hypovolemic/hypoosmolar  4.  Dispo: - We will be happy to pick her up once she leaves ICU.  She is doing much better today than yesterday.

## 2013-01-20 NOTE — Progress Notes (Signed)
Family Medicine Teaching Service Daily Progress Note Service Page: [phone]  Subjective:  Feeling better this am.  Eager to eat and drink.   Objective: Temp:  [97.2 F (36.2 C)-97.5 F (36.4 C)] 97.3 F (36.3 C) (03/20 0400) Pulse Rate:  [66-87] 66 (03/20 0600) Resp:  [10-26] 10 (03/20 0600) BP: (70-113)/(33-65) 101/59 mmHg (03/20 0600) SpO2:  [94 %-100 %] 99 % (03/20 0600) Weight:  [169 lb 12.1 oz (77 kg)] 169 lb 12.1 oz (77 kg) (03/20 0300)  Exam: General: resting comfortably in bed. NAD. Cardiovascular: RRR. No murmurs, rubs, or gallops. Respiratory: CTAB. No rales, rhonchi, or wheezing. Abdomen: soft, nontender, nondistended. Extremities: warm, well perfused. No edema. Neuro: Alert, responsive to questions. Oriented to person, place.   Labs:  CBC BMET   Recent Labs Lab 01/19/13 2114 01/20/13 0333  WBC 14.8* 11.7*  HGB 13.2 11.1*  HCT 35.3* 30.5*  PLT 404* 340    Recent Labs Lab 01/19/13 2114 01/20/13 0333  NA 117* 123*  K 3.1* 4.4  CL 73* 90*  CO2 18* 16*  BUN 98* 80*  CREATININE 7.77* 5.93*  5.86*  GLUCOSE 158* 138*  CALCIUM 9.3 7.4*     Urinalysis    Component Value Date/Time   COLORURINE YELLOW 01/19/2013 2127   APPEARANCEUR TURBID* 01/19/2013 2127   LABSPEC 1.025 01/19/2013 2127   PHURINE 5.5 01/19/2013 2127   GLUCOSEU 100* 01/19/2013 2127   HGBUR MODERATE* 01/19/2013 2127   HGBUR negative 05/30/2010 0934   BILIRUBINUR MODERATE* 01/19/2013 2127   BILIRUBINUR NEG 11/17/2012 1041   KETONESUR NEGATIVE 01/19/2013 2127   PROTEINUR >300* 01/19/2013 2127   UROBILINOGEN 1.0 01/19/2013 2127   UROBILINOGEN 0.2 11/17/2012 1041   NITRITE POSITIVE* 01/19/2013 2127   NITRITE NEG 11/17/2012 1041   LEUKOCYTESUR MODERATE* 01/19/2013 2127   Imaging/Diagnostic Tests:  Ct Head Wo Contrast 01/19/2013  IMPRESSION:  1.  Normal intracranially. 2.  Probable calcified sebaceous cyst in the right frontal scalp near the vertex.   Dg Chest Portable 1 View 01/20/2013   IMPRESSION: Right central venous catheter tip overlies the SVC.  No pneumothorax.  Otherwise stable appearance of the chest.  Dg Chest Portable 1 View 01/19/2013   IMPRESSION: Suboptimal inspiration accounts for bibasilar atelectasis.  No acute cardiopulmonary disease otherwise.  Stable cardiomegaly without pulmonary edema.    Micro: Results for orders placed during the hospital encounter of 01/19/13  MRSA PCR SCREENING     Status: None   Collection Time    01/20/13  3:36 AM      Result Value Range Status   MRSA by PCR NEGATIVE  NEGATIVE Final   Comment:            The GeneXpert MRSA Assay (FDA     approved for NASAL specimens     only), is one component of a     comprehensive MRSA colonization     surveillance program. It is not     intended to diagnose MRSA     infection nor to guide or     monitor treatment for     MRSA infections.   Urine Cx - pending Blood culture x 2 - pending  Assessment/Plan: Krista [name] is a 57 y.o. year old female with a history of HTN, DM-2,.and psychosis who presented with altered mental status and found to be in septic shock.  # Septic shock secondary to UTI - In the ED patient found to have leukocytosis, hyponatremia, metabolic acidosis with bicarb of 18, Creatinine  of 7.77 and UA positive for nitrites, moderate leukocytes, and many bacteria. - MAP remained below 65 despite aggressive fluid resuscitation (4L). - Patient treated empirically with CTX and Zosyn and CCM was called. - Patient currently being managed by CCM:  - Antibiotics - Zosyn x 1, CTX 2 g (3/19 >>)  - Now s/p 8 L NS.  Currently on Levophed.  # Acute Kidney Injury - Secondary to septic shock. - Creatinine improving with aggressive fluid management. Creatinine 5.93 this am.  # Acute delirium - Holding home medications at this time  # Hyponatremia - secondary to hypovolemia - Improving with fluid resuscitation. Na+ 123 this am.   # Hypokalemia  - Now resolved.   # DM-2   - SSI, sensitive - CBG's Q4  # HTN - Currently holding antihypertensives  FEN/GI: NS @ 150 mL/hr.  PPx: Heparin SQ  Dispo: Pending clinical improvement Code Status: Full   [name], DO 01/20/2013, 7:14 AM

## 2013-01-21 LAB — URINE CULTURE: Colony Count: >=100000

## 2013-01-21 LAB — RENAL FUNCTION PANEL
CO2: 20 mEq/L (ref 19–32)
Calcium: 9 mg/dL (ref 8.4–10.5)
GFR calc Af Amer: 39 mL/min — ABNORMAL LOW (ref >90)
Glucose, Bld: 88 mg/dL (ref 70–99)
Sodium: 134 mEq/L — ABNORMAL LOW (ref 135–145)

## 2013-01-21 LAB — BASIC METABOLIC PANEL
CO2: 20 mEq/L (ref 19–32)
GFR calc non Af Amer: 63 mL/min — ABNORMAL LOW (ref >90)
Glucose, Bld: 244 mg/dL — ABNORMAL HIGH (ref 70–99)
Potassium: 4 mEq/L (ref 3.5–5.1)
Sodium: 130 mEq/L — ABNORMAL LOW (ref 135–145)

## 2013-01-21 LAB — GLUCOSE, CAPILLARY
Glucose-Capillary: 119 mg/dL — ABNORMAL HIGH (ref 70–99)
Glucose-Capillary: 236 mg/dL — ABNORMAL HIGH (ref 70–99)
Glucose-Capillary: 94 mg/dL (ref 70–99)

## 2013-01-21 LAB — HEPATIC FUNCTION PANEL
Bilirubin, Direct: 0.2 mg/dL (ref 0.0–0.3)
Indirect Bilirubin: 0.2 mg/dL — ABNORMAL LOW (ref 0.3–0.9)
Total Bilirubin: 0.4 mg/dL (ref 0.3–1.2)

## 2013-01-21 LAB — CBC
MCH: 30.3 pg (ref 26.0–34.0)
MCHC: 36.6 g/dL — ABNORMAL HIGH (ref 30.0–36.0)
MCV: 82.7 fL (ref 78.0–100.0)
Platelets: 309 10*3/uL (ref 150–400)
RDW: 12.6 % (ref 11.5–15.5)

## 2013-01-21 MED ORDER — SODIUM CHLORIDE 0.9 % IV SOLN
6.0000 g | Freq: Once | INTRAVENOUS | Status: AC
  Administered 2013-01-21: 6 g via INTRAVENOUS
  Filled 2013-01-21: qty 12

## 2013-01-21 MED ORDER — POTASSIUM CHLORIDE CRYS ER 20 MEQ PO TBCR
40.0000 meq | EXTENDED_RELEASE_TABLET | Freq: Once | ORAL | Status: AC
  Administered 2013-01-21: 40 meq via ORAL
  Filled 2013-01-21: qty 2

## 2013-01-21 MED ORDER — POTASSIUM CHLORIDE 10 MEQ/50ML IV SOLN
10.0000 meq | Freq: Once | INTRAVENOUS | Status: AC
  Administered 2013-01-21: 10 meq via INTRAVENOUS
  Filled 2013-01-21: qty 50

## 2013-01-21 MED ORDER — POTASSIUM CHLORIDE 10 MEQ/50ML IV SOLN
10.0000 meq | INTRAVENOUS | Status: AC
  Administered 2013-01-21 (×5): 10 meq via INTRAVENOUS
  Filled 2013-01-21: qty 100
  Filled 2013-01-21: qty 200

## 2013-01-21 MED ORDER — INSULIN ASPART 100 UNIT/ML ~~LOC~~ SOLN
0.0000 [IU] | Freq: Three times a day (TID) | SUBCUTANEOUS | Status: DC
  Administered 2013-01-21: 3 [IU] via SUBCUTANEOUS
  Administered 2013-01-21 – 2013-01-22 (×2): 1 [IU] via SUBCUTANEOUS
  Administered 2013-01-22: 2 [IU] via SUBCUTANEOUS
  Administered 2013-01-23: 1 [IU] via SUBCUTANEOUS
  Administered 2013-01-23: 13:00:00 via SUBCUTANEOUS

## 2013-01-21 MED ORDER — METOPROLOL TARTRATE 12.5 MG HALF TABLET
12.5000 mg | ORAL_TABLET | Freq: Two times a day (BID) | ORAL | Status: DC
  Administered 2013-01-21 – 2013-01-22 (×3): 12.5 mg via ORAL
  Filled 2013-01-21 (×4): qty 1

## 2013-01-21 MED ORDER — HYDRALAZINE HCL 20 MG/ML IJ SOLN
5.0000 mg | Freq: Four times a day (QID) | INTRAMUSCULAR | Status: DC | PRN
Start: 2013-01-21 — End: 2013-01-24
  Filled 2013-01-21: qty 0.25

## 2013-01-21 MED ORDER — GLUCERNA SHAKE PO LIQD
237.0000 mL | Freq: Two times a day (BID) | ORAL | Status: DC
  Administered 2013-01-21 – 2013-01-24 (×6): 237 mL via ORAL

## 2013-01-21 NOTE — Progress Notes (Signed)
CRITICAL VALUE ALERT  Critical value received:  K 2.5  Date of notification:  01/21/2013  Time of notification:  0529  Critical value read back:yes  Nurse who received alert:  Lamount Cranker  MD notified (1st page):  Dr. Frederico Hamman  Time of first page:  0530  MD notified (2nd page):  Time of second page:  Responding MD:  Dr. Frederico Hamman  Time MD responded:  [phone]

## 2013-01-21 NOTE — Progress Notes (Signed)
PULMONARY  / CRITICAL CARE MEDICINE  Name: Krista [name] MRN: [id number] DOB: 1956-10-18    ADMISSION DATE:  01/19/2013 CONSULTATION DATE:  01/20/2013  REFERRING MD :  Juleen China, EDP  CHIEF COMPLAINT:  Septic shock  BRIEF PATIENT DESCRIPTION: 57 y/o female with schizophrenia and DM2 was admitted on 3/20 with septic shock from a urinary tract infection.  SIGNIFICANT EVENTS / STUDIES: 3/20 off pressors    LINES / TUBES: 3/19 R IJ CVL (EDP) >>  CULTURES: 3/19 Blood>>ng 3/19 urine >>  ANTIBIOTICS: 3/19 zosyn x1 3/19 ceftriaxone >>   HISTORY OF PRESENT ILLNESS:  57 y/o female with schizophrenia and DM2 was admitted on 3/20 with septic shock from a urinary tract infection.  Her family states that two weeks prior to admission she was seen by her PCP who treated her with antibiotics for a UTI.  However the patient did not take the antibiotics.  She developed progressive lethargy, anorexia, and lack of interaction with family.  They brought her to the ED on 3/19 because of progressive symptoms.  She was found to be in shock which did not respond appropriately to 4 L normal saline.  PCCM consulted for admission.   SUBJECTIVE: denies CP, dyspnea Rested well last night Feels better, denies hallucinations  VITAL SIGNS: Temp:  [97.6 F (36.4 C)-98.1 F (36.7 C)] 98.1 F (36.7 C) (03/21 0833) Pulse Rate:  [82-112] 96 (03/21 1056) Resp:  [11-22] 13 (03/21 1000) BP: (86-156)/(59-102) 145/82 mmHg (03/21 1056) SpO2:  [96 %-100 %] 99 % (03/21 1000) Weight:  [79 kg (174 lb 2.6 oz)] 79 kg (174 lb 2.6 oz) (03/21 0400) HEMODYNAMICS: CVP:  [0 mmHg-14 mmHg] 0 mmHg    INTAKE / OUTPUT: Intake/Output     03/20 0701 - 03/21 0700 03/21 0701 - 03/22 0700   P.O. 600    I.V. (mL/kg) 3491.4 (44.2)    Other     IV Piggyback 150 150   Total Intake(mL/kg) 4241.4 (53.7) 150 (1.9)   Urine (mL/kg/hr) 5525 (2.9)    Stool  2 (0)   Total Output 5525 2   Net -1283.7 +148        Urine Occurrence 2 x 1 x    Stool Occurrence 2 x      PHYSICAL EXAMINATION:  Gen: pleasant mood, no distress HEENT: NCAT, PERRL, EOMi, MM dry PULM: clear CV: RRR, no mgr, no JVD AB: BS+, soft, nontender, no hsm Ext: warm, no edema, no clubbing, no cyanosis Derm: no rash or skin breakdown Neuro: Awake and alert, non focal,no cog wheeling or tremors, answers questions with one word responses   LABS:  Recent Labs Lab 01/19/13 2114 01/19/13 2354 01/20/13 0333 01/20/13 1800 01/21/13 0443 01/21/13 0446  HGB 13.2  --  11.1*  --   --  11.2*  WBC 14.8*  --  11.7*  --   --  7.1  PLT 404*  --  340  --   --  309  NA 117*  --  123* 131* 134*  --   K 3.1*  --  4.4 2.6* 2.5*  --   CL 73*  --  90* 97 99  --   CO2 18*  --  16* 19 20  --   GLUCOSE 158*  --  138* 142* 88  --   BUN 98*  --  80* 59* 41*  --   CREATININE 7.77*  --  5.93*  5.86* 2.93* 1.66*  --   CALCIUM 9.3  --  7.4* 8.9 9.0  --   MG  --   --   --   --   --  1.4*  PHOS  --   --   --   --  2.9  --   AST 44*  --   --   --   --  30  ALT 40*  --   --   --   --  25  ALKPHOS 140*  --   --   --   --  104  BILITOT 0.9  --   --   --   --  0.4  PROT 6.9  --   --   --   --  5.7*  ALBUMIN 3.5  --   --   --  2.7* 2.6*  APTT  --   --  28  --   --   --   INR  --   --  1.14  --   --   --   LATICACIDVEN  --  1.37 0.5  --   --   --   TROPONINI <0.30  --   --   --   --   --   PROCALCITON  --   --  0.21  --   --   --     Recent Labs Lab 01/20/13 1616 01/20/13 1943 01/20/13 2339 01/21/13 0411 01/21/13 0821  GLUCAP 180* 118* 94 80 90    3/19 CXR: R IJ in place, lungs clear  ASSESSMENT / PLAN:  PULMONARY A: Tobacco abuse P:   -monitor O2 saturation -counsel to quit tobacco  CARDIOVASCULAR A: Septic shock from a urinary source; hypovolemic P:  -resolved  RENAL A:  Acute renal failure from sepsis and hypovolemia from poor po intake in setting on metformin/ lisinopril History of nephrolithiasis; no back pain currently (her usual complaint with  nephrolithiasis) Hyponatremia in setting of profound volume depletion -renal ultrasound - no hnosis Hypokalemia/ hypomag P:   -ct  IVF @ 100/h to replace losses during polyuric phase -monitor BMET twice daily  -replete lytes aggressively   INFECTIOUS A:  Septic shock due to a urinary source; no flank or back pain so do not suspect obstructive uropathy/struvite stone but if worsening shock needs CT imaging of stone Unclear if she took prescribed keflex  P:   -ceftriaxone (e.coli sensitive to ceftriaxone) -f/u cultures  ENDOCRINE A:  DM2 P:   -SSI  NEUROLOGIC A:  Delirium due to sepsis, renal failure and multiple sedating meds at home Schizophrenia P:   -resume home meds   Have asked FPTS to assume care PCCM to sign off, Can transfer to floor  [facility] V.,MD Pulmonary and Critical Care Medicine [facility] Pager: [phone]  01/21/2013, 10:58 AM

## 2013-01-21 NOTE — Progress Notes (Signed)
INITIAL NUTRITION ASSESSMENT  DOCUMENTATION CODES Per approved criteria  -Not Applicable   INTERVENTION: Glucerna Shake BID which will provide 440 kcal and 20 grams of protein  NUTRITION DIAGNOSIS: Unintentional weight loss related to suspected poor POs as evidenced by 17 pound weight loss (9% body weight) in two months.   Goal: Intake to meet >/=90% estimated nutrition needs  Monitor:  Tolerance of Glucerna Shake, PO's, weight trends, labs  Reason for Assessment: Malnutrition Screening Tool  57 y.o. female  Admitting Dx: Severe sepsis with acute organ dysfunction  ASSESSMENT: Pt admitted with complaints of LQ abdominal pain radiating to lower back. Pt with hx of kidney stones. Per notes, pt with UTI recently but unclear if took the abx that were prescribed.  Pt with worsening mental status since onset, family brought to ED with worsening mental status and fatigue.   Per Resident note, pt with hyponatremia with concern for poor PO's for several weeks.   Pt with hx of Type II DM, schizophrenia, tobacco and substance abuse, and bipolar disorder; now with delirium 2/2 sepsis. Sepsis protocol ordered and then d/c'ed yesterday.   Pt reports good appetite and no nausea. No meal completion documented at this time. She states that her usual weight is around 200 pounds although she is not sure of an exact weight. Pt reports that she feels she has lost weight in the past few months, weight history is consistent with this showing a 17 pound weight loss (9% body weight) in two months.  Pt is interested in trying Glucerna. Will order Glucerna Shake BID.  Height: Ht Readings from Last 1 Encounters:  01/20/13 5\' 5"  (1.651 m)    Weight: Wt Readings from Last 1 Encounters:  01/21/13 174 lb 2.6 oz (79 kg)    Ideal Body Weight: 125 lbs   % Ideal Body Weight: 139%  Wt Readings from Last 10 Encounters:  01/21/13 174 lb 2.6 oz (79 kg)  11/17/12 191 lb (86.637 kg)  10/15/12 205 lb  (92.987 kg)  10/07/12 206 lb (93.441 kg)  09/10/12 202 lb (91.627 kg)  07/30/12 207 lb (93.895 kg)  02/12/12 211 lb (95.709 kg)  03/25/11 208 lb (94.348 kg)  12/31/10 200 lb (90.719 kg)  07/19/10 201 lb 8 oz (91.4 kg)    Usual Body Weight: 200 lbs, per pt report  % Usual Body Weight: 87%  BMI:  Body mass index is 28.98 kg/(m^2).--Overweight  Estimated Nutritional Needs: Kcal: 1600-1800 Protein: 80-90 grams Fluid: 1.6-1.8 L/day  Skin: intact, no wounds noted  Diet Order: Cardiac  EDUCATION NEEDS: -No education needs identified at this time   Intake/Output Summary (Last 24 hours) at 01/21/13 0836 Last data filed at 01/21/13 0800  Gross per 24 hour  Intake 4122.55 ml  Output   5526 ml  Net -1403.45 ml    Last BM: 3/21  Labs:   Recent Labs Lab 01/20/13 0333 01/20/13 1800 01/21/13 0443 01/21/13 0446  NA 123* 131* 134*  --   K 4.4 2.6* 2.5*  --   CL 90* 97 99  --   CO2 16* 19 20  --   BUN 80* 59* 41*  --   CREATININE 5.93*  5.86* 2.93* 1.66*  --   CALCIUM 7.4* 8.9 9.0  --   MG  --   --   --  1.4*  PHOS  --   --  2.9  --   GLUCOSE 138* 142* 88  --     CBG (last 3)  Recent Labs  01/20/13 2339 01/21/13 0411 01/21/13 0821  GLUCAP 94 80 90    Scheduled Meds: . cefTRIAXone (ROCEPHIN)  IV  2 g Intravenous Q24H  . heparin  5,000 Units Subcutaneous Q8H  . insulin aspart  1-3 Units Subcutaneous Q4H  . magnesium sulfate LVP 250-500 ml  6 g Intravenous Once  . pneumococcal 23 valent vaccine  0.5 mL Intramuscular Tomorrow-1000  . potassium chloride  10 mEq Intravenous Q1 Hr x 6    Continuous Infusions: . sodium chloride 1,000 mL (01/21/13 0244)    Past Medical History  Diagnosis Date  . Arthritis   . Diabetes mellitus   . Hyperlipidemia   . Hypertension   . Allergy   . GERD (gastroesophageal reflux disease)   . Back pain     History reviewed. No pertinent past surgical history.   Trenton Gammon Dietetic Intern # [phone]  Agree with  Dietetic intern note  Clarene Duke RD, LDN Pager [phone] After Hours pager [phone]

## 2013-01-21 NOTE — Progress Notes (Signed)
Family Medicine Teaching Service Daily Progress Note Service Page: [phone]  Subjective:  Feeling well this am. No complaints.  Denies chest pain, SOB.  Objective: Temp:  [97.6 F (36.4 C)-98.1 F (36.7 C)] 98 F (36.7 C) (03/21 0400) Pulse Rate:  [75-112] 95 (03/21 0800) Resp:  [11-22] 18 (03/21 0800) BP: (86-156)/(59-102) 148/102 mmHg (03/21 0800) SpO2:  [96 %-100 %] 99 % (03/21 0800) Weight:  [174 lb 2.6 oz (79 kg)] 174 lb 2.6 oz (79 kg) (03/21 0400)  Exam: General: resting comfortably in bed. NAD. Eating breakfast. Cardiovascular: RRR. No murmurs, rubs, or gallops. Respiratory: CTAB. No rales, rhonchi, or wheezing. Abdomen: soft, nontender, nondistended. Extremities: warm, well perfused. No edema. Neuro: Alert, responsive to questions. Oriented to person, place.  Labs:  CBC BMET   Recent Labs Lab 01/19/13 2114 01/20/13 0333 01/21/13 0446  WBC 14.8* 11.7* 7.1  HGB 13.2 11.1* 11.2*  HCT 35.3* 30.5* 30.6*  PLT 404* 340 309    Recent Labs Lab 01/20/13 0333 01/20/13 1800 01/21/13 0443  NA 123* 131* 134*  K 4.4 2.6* 2.5*  CL 90* 97 99  CO2 16* 19 20  BUN 80* 59* 41*  CREATININE 5.93*  5.86* 2.93* 1.66*  GLUCOSE 138* 142* 88  CALCIUM 7.4* 8.9 9.0     Urinalysis    Component Value Date/Time   COLORURINE YELLOW 01/19/2013 2127   APPEARANCEUR TURBID* 01/19/2013 2127   LABSPEC 1.025 01/19/2013 2127   PHURINE 5.5 01/19/2013 2127   GLUCOSEU 100* 01/19/2013 2127   HGBUR MODERATE* 01/19/2013 2127   HGBUR negative 05/30/2010 0934   BILIRUBINUR MODERATE* 01/19/2013 2127   BILIRUBINUR NEG 11/17/2012 1041   KETONESUR NEGATIVE 01/19/2013 2127   PROTEINUR >300* 01/19/2013 2127   UROBILINOGEN 1.0 01/19/2013 2127   UROBILINOGEN 0.2 11/17/2012 1041   NITRITE POSITIVE* 01/19/2013 2127   NITRITE NEG 11/17/2012 1041   LEUKOCYTESUR MODERATE* 01/19/2013 2127   Imaging/Diagnostic Tests:  Ct Head Wo Contrast 01/19/2013  IMPRESSION:  1.  Normal intracranially. 2.  Probable  calcified sebaceous cyst in the right frontal scalp near the vertex.   Dg Chest Portable 1 View 01/20/2013  IMPRESSION: Right central venous catheter tip overlies the SVC.  No pneumothorax.  Otherwise stable appearance of the chest.  Dg Chest Portable 1 View 01/19/2013   IMPRESSION: Suboptimal inspiration accounts for bibasilar atelectasis.  No acute cardiopulmonary disease otherwise.  Stable cardiomegaly without pulmonary edema.    Micro: Results for orders placed during the hospital encounter of 01/19/13  CULTURE, BLOOD (ROUTINE X 2)     Status: None   Collection Time    01/19/13  8:52 PM      Result Value Range Status   Specimen Description BLOOD ARM RIGHT   Final   Special Requests BOTTLES DRAWN AEROBIC ONLY 5CC   Final   Culture  Setup Time 01/20/2013 02:44   Final   Culture     Final   Value:        BLOOD CULTURE RECEIVED NO GROWTH TO DATE CULTURE WILL BE HELD FOR 5 DAYS BEFORE ISSUING A FINAL NEGATIVE REPORT   Report Status PENDING   Incomplete  CULTURE, BLOOD (ROUTINE X 2)     Status: None   Collection Time    01/19/13  9:05 PM      Result Value Range Status   Specimen Description BLOOD ARM RIGHT   Final   Special Requests BOTTLES DRAWN AEROBIC ONLY 5CC   Final   Culture  Setup Time 01/20/2013 02:44  Final   Culture     Final   Value:        BLOOD CULTURE RECEIVED NO GROWTH TO DATE CULTURE WILL BE HELD FOR 5 DAYS BEFORE ISSUING A FINAL NEGATIVE REPORT   Report Status PENDING   Incomplete  URINE CULTURE     Status: None   Collection Time    01/19/13  9:27 PM      Result Value Range Status   Specimen Description URINE, RANDOM   Final   Special Requests NONE   Final   Culture  Setup Time 01/19/2013 22:16   Final   Colony Count PENDING   Incomplete   Culture Culture reincubated for better growth   Final   Report Status PENDING   Incomplete  MRSA PCR SCREENING     Status: None   Collection Time    01/20/13  3:36 AM      Result Value Range Status   MRSA by PCR NEGATIVE   NEGATIVE Final   Comment:            The GeneXpert MRSA Assay (FDA     approved for NASAL specimens     only), is one component of a     comprehensive MRSA colonization     surveillance program. It is not     intended to diagnose MRSA     infection nor to guide or     monitor treatment for     MRSA infections.   Urine Cx - E coli (>100,000 CFU) - Pan sensitive except ampicillin Blood culture x 2 - Negative to Date  Assessment/Plan: Krista [name] is a 57 y.o. year old female with a history of HTN, DM-2,.and psychosis who presented with altered mental status and found to be in septic shock.  # Septic shock secondary to UTI - In the ED patient found to have leukocytosis, hyponatremia, metabolic acidosis with bicarb of 18, Creatinine of 7.77 and UA positive for nitrites, moderate leukocytes, and many bacteria. - MAP remained below 65 despite aggressive fluid resuscitation (4L). - Patient treated empirically with CTX and Zosyn and CCM was called. - Patient now on our service as of 3/21. - Blood culture negative to date, Urine Cx revealed E. Coli (see above).  Will continue CTX (day 2) today and transition to Keflex tomorrow.  - Transferring to Telemetry today.  # Acute Kidney Injury - Secondary to septic shock/hypovolemia. - Creatinine markedly improved - 1.66 this am.  - Will continue NS @ 150 mL/hr  # Acute delirium - Appears resolved.  - Holding home medications at this time  # Hyponatremia - secondary to hypovolemia - Na+ 134 this am.   # Hypokalemia and Hypomagnesemia  - Potassium 2.5 this am and Mg 1.4 this am - Repleting this am. Will recheck BMP   # DM-2  - SSI, sensitive - CBG's TIDAC and QHS  # HTN - Currently holding home ACE and HCTZ - Adding Metoprolol 12.5 mg BID today, Hydralazine PRN  FEN/GI: NS @ 150 mL/hr.  PPx: Heparin SQ  Dispo: Pending clinical improvement. Transfer to telemetry today. Code Status: Full   Krista Other, DO 01/21/2013, 8:20  AM

## 2013-01-21 NOTE — Progress Notes (Signed)
Hypokalemia   K replaced  

## 2013-01-21 NOTE — Progress Notes (Signed)
FMTS Attending Daily Note: Sara Neal MD [phone] pager office [phone] I  have seen and examined this patient, reviewed their chart. I have discussed this patient with the resident. I agree with the resident's findings, assessment and care plan. 

## 2013-01-21 NOTE — Clinical Social Work Psychosocial (Signed)
     Clinical Social Work Department BRIEF PSYCHOSOCIAL ASSESSMENT 01/21/2013  Patient:  Krista [name], Krista [name]     Account Number:  [id number]     Admit date:  01/19/2013  Clinical Social Worker:  Margaree Mackintosh  Date/Time:  01/21/2013 12:00 M  Referred by:  Physician  Date Referred:  01/21/2013 Referred for  Other - See comment   Other Referral:   "Other psychosocial needs".   Interview type:  Patient Other interview type:   With sister, Burna Mortimer, present.    PSYCHOSOCIAL DATA Living Status:  FAMILY Admitted from facility:   Level of care:   Primary support name:  [place]: [phone] Primary support relationship to patient:  NONE Degree of support available:   Unknown.    CURRENT CONCERNS Current Concerns  Other - See comment   Other Concerns:   "Other psychosocial needs".    SOCIAL WORK ASSESSMENT / PLAN Clinical Social Worker recieved referral for "other psychosocial needs".  CSW reviewed chart and met with pt and pt's sister at bedside.  CSW introduced self, explained role, and provided support.  CSW inquired about orientation questions.  Pt able to state she is at "[facility]" and identify her sister at bedside.  Pt unable to state month or list any supports.  Sister provides most information.  Sister states pt recieves counseling at [place].  CSW inquired about post acute needs.  Pt and sister unable to identify needs at this time.  Pt transferred to 6700, CSW provided updated to [facility] and unit CSW.  CSW to continue to follow and assist as needed.   Assessment/plan status:  Information/Referral to [facility] Other assessment/ plan:   Information/referral to community resources:   [employer].    PATIENTS/FAMILYS RESPONSE TO PLAN OF CARE: Pt and sister thanked CSW for intervention.

## 2013-01-21 NOTE — Progress Notes (Signed)
Pt arrived to the unit via wheel chair from 2100. Pt alert and oriented to self and place. Responds to questions appropriately. Pt noted with delayed response. Pt denies any discomfort or pain. O2 sats 90% room air. BP slightly elevated. Pt placed on telemetry. Family and pt oriented to the unit. Will cont to monitor.

## 2013-01-22 DIAGNOSIS — I10 Essential (primary) hypertension: Secondary | ICD-10-CM

## 2013-01-22 DIAGNOSIS — F29 Unspecified psychosis not due to a substance or known physiological condition: Secondary | ICD-10-CM

## 2013-01-22 LAB — RENAL FUNCTION PANEL
Albumin: 2.9 g/dL — ABNORMAL LOW (ref 3.5–5.2)
BUN: 16 mg/dL (ref 6–23)
Calcium: 9.4 mg/dL (ref 8.4–10.5)
Creatinine, Ser: 0.75 mg/dL (ref 0.50–1.10)
Phosphorus: 2.3 mg/dL (ref 2.3–4.6)
Potassium: 3.2 mEq/L — ABNORMAL LOW (ref 3.5–5.1)

## 2013-01-22 LAB — CBC
HCT: 33 % — ABNORMAL LOW (ref 36.0–46.0)
RDW: 13 % (ref 11.5–15.5)
WBC: 8.8 10*3/uL (ref 4.0–10.5)

## 2013-01-22 LAB — GLUCOSE, CAPILLARY
Glucose-Capillary: 113 mg/dL — ABNORMAL HIGH (ref 70–99)
Glucose-Capillary: 152 mg/dL — ABNORMAL HIGH (ref 70–99)
Glucose-Capillary: 123 mg/dL — ABNORMAL HIGH (ref 70–99)

## 2013-01-22 LAB — MAGNESIUM: Magnesium: 1.7 mg/dL (ref 1.5–2.5)

## 2013-01-22 MED ORDER — LISINOPRIL 20 MG PO TABS
20.0000 mg | ORAL_TABLET | Freq: Every day | ORAL | Status: DC
  Administered 2013-01-22 – 2013-01-24 (×3): 20 mg via ORAL
  Filled 2013-01-22 (×4): qty 1

## 2013-01-22 MED ORDER — CEPHALEXIN 500 MG PO CAPS
500.0000 mg | ORAL_CAPSULE | Freq: Three times a day (TID) | ORAL | Status: DC
  Administered 2013-01-23 – 2013-01-24 (×5): 500 mg via ORAL
  Filled 2013-01-22 (×7): qty 1

## 2013-01-22 MED ORDER — HYDROCHLOROTHIAZIDE 25 MG PO TABS
25.0000 mg | ORAL_TABLET | Freq: Every day | ORAL | Status: DC
  Administered 2013-01-22 – 2013-01-23 (×2): 25 mg via ORAL
  Filled 2013-01-22 (×3): qty 1

## 2013-01-22 MED ORDER — GABAPENTIN 300 MG PO CAPS
300.0000 mg | ORAL_CAPSULE | Freq: Three times a day (TID) | ORAL | Status: DC
  Administered 2013-01-22 – 2013-01-24 (×8): 300 mg via ORAL
  Filled 2013-01-22 (×10): qty 1

## 2013-01-22 MED ORDER — BENZTROPINE MESYLATE 1 MG PO TABS
1.0000 mg | ORAL_TABLET | Freq: Every day | ORAL | Status: DC
  Administered 2013-01-22 – 2013-01-23 (×2): 1 mg via ORAL
  Filled 2013-01-22 (×3): qty 1

## 2013-01-22 MED ORDER — FLUCONAZOLE 200 MG PO TABS
200.0000 mg | ORAL_TABLET | Freq: Every day | ORAL | Status: DC
  Administered 2013-01-22 – 2013-01-24 (×3): 200 mg via ORAL
  Filled 2013-01-22 (×3): qty 1

## 2013-01-22 MED ORDER — POTASSIUM CHLORIDE CRYS ER 20 MEQ PO TBCR
40.0000 meq | EXTENDED_RELEASE_TABLET | Freq: Once | ORAL | Status: AC
  Administered 2013-01-22: 40 meq via ORAL
  Filled 2013-01-22: qty 2

## 2013-01-22 MED ORDER — LISINOPRIL-HYDROCHLOROTHIAZIDE 20-25 MG PO TABS: 1.0000 | ORAL_TABLET | Freq: Every morning | ORAL | Status: DC

## 2013-01-22 NOTE — Evaluation (Signed)
Physical Therapy Evaluation Patient Details Name: BREZLYN MANRIQUE MRN: [id number] DOB: April 10, 1956 Today's Date: 01/22/2013 Time: 4098-1191 PT Time Calculation (min): 26 min  PT Assessment / Plan / Recommendation Clinical Impression  Patient recieved in bedside chair nearly completely out of chair and sliding towards floor.  +2 and at time +3 assist to help patient stand and transfer. Patient continued to have runny BM while standing and transferring to bed with bed pan.  Assisted in cleaning patient and positioning in bed. Patient able to perform rolling in bed but required assist for all other activities. Rec SNF upon discharge for improvements in functional mobility.    PT Assessment  Patient needs continued PT services    Follow Up Recommendations  SNF    Does the patient have the potential to tolerate intense rehabilitation      Barriers to Discharge        Equipment Recommendations  None recommended by PT    Recommendations for Other Services     Frequency Min 2X/week    Precautions / Restrictions     Pertinent Vitals/Pain NAD      Mobility  Bed Mobility Bed Mobility: Rolling Right;Rolling Left;Sit to Supine;Scooting to [facility] Rolling Right: 4: Min assist Rolling Left: 4: Min assist Sit to Supine: 1: +2 Total assist Sit to Supine: Patient Percentage: 40% Scooting to HOB: 1: +2 Total assist Scooting to [facility]: Patient Percentage: 20% Transfers Transfers: Sit to Stand;Stand to Sit Sit to Stand: 1: +2 Total assist;From chair/3-in-1 Sit to Stand: Patient Percentage: 50% Stand to Sit: 1: +2 Total assist;To bed Stand to Sit: Patient Percentage: 30% Details for Transfer Assistance: Pt requiring total assist with +2 and at times 3rd person; possible due to pt fixated on fact that she was having BM while standing during transition. Pt assisted to bed and thoroughly cleaned and positioned in bed Ambulation/Gait Ambulation/Gait Assistance: Not tested (comment)    Exercises      PT Diagnosis: Difficulty walking;Abnormality of gait;Altered mental status  PT Problem List: Decreased strength;Decreased range of motion;Decreased activity tolerance;Decreased balance;Decreased mobility;Decreased cognition PT Treatment Interventions: Gait training;Functional mobility training;Therapeutic activities;Therapeutic exercise;Patient/family education   PT Goals Acute Rehab PT Goals PT Goal Formulation: Patient unable to participate in goal setting Time For Goal Achievement: 01/29/13 Potential to Achieve Goals: Fair Pt will go Sit to Stand: with supervision PT Goal: Sit to Stand - Progress: Goal set today Pt will go Stand to Sit: with supervision PT Goal: Stand to Sit - Progress: Goal set today Pt will Ambulate: >150 feet;with supervision PT Goal: Ambulate - Progress: Goal set today  Visit Information  Last PT Received On: 01/22/13 Assistance Needed: +2    Subjective Data  Subjective: Pt perseverating on the phrase "i apologize" Patient Stated Goal: none indicated   Prior Functioning  Home Living Lives With: Family Type of Home: House Additional Comments: Pt poor historian at this time, perseverating and unable to provide clear answers to PT questions Prior Function Comments: Unknown PLOF at this time    Cognition  Cognition Overall Cognitive Status: History of cognitive impairments - at baseline Arousal/Alertness: Awake/alert Orientation Level: Disoriented X4 Behavior During Session: Anxious    Extremity/Trunk Assessment Right Upper Extremity Assessment RUE ROM/Strength/Tone: [facility] for tasks assessed Left Upper Extremity Assessment LUE ROM/Strength/Tone: WFL for tasks assessed Right Lower Extremity Assessment RLE ROM/Strength/Tone: Deficits;Due to impaired cognition Left Lower Extremity Assessment LLE ROM/Strength/Tone: Deficits;Due to impaired cognition   Balance    End of Session PT - End of  Session Equipment Utilized During Treatment: Gait  belt Activity Tolerance: Other (comment) (limited to patient BM during activity) Patient left: in bed;with bed alarm set;with nursing in room Nurse Communication: Mobility status;Other (comment) (pt spilled entire glycerna shake; urine and MB incontinent)  GP     Fabio Asa 01/22/2013, 12:31 PM Charlotte Crumb, PT DPT  [phone]

## 2013-01-22 NOTE — Progress Notes (Signed)
FMTS Daily Intern Progress Note: pager: [phone]  Subjective:  No acute events overnight. Denies any pain with urination or abdominal pain.  Incontinent of stool and urine, per RN's report. Walks to commode with assistance.   I have reviewed the patient's medications.  Objective Temp:  [97.8 F (36.6 C)-98.7 F (37.1 C)] 97.9 F (36.6 C) (03/22 0418) Pulse Rate:  [80-103] 94 (03/22 0418) Resp:  [13-20] 18 (03/22 0418) BP: (128-161)/(82-98) 158/98 mmHg (03/22 0418) SpO2:  [90 %-100 %] 98 % (03/22 0418) Weight:  [171 lb 6.4 oz (77.747 kg)] 171 lb 6.4 oz (77.747 kg) (03/21 2110)   Intake/Output Summary (Last 24 hours) at 01/22/13 0955 Last data filed at 01/22/13 0700  Gross per 24 hour  Intake   1440 ml  Output      0 ml  Net   1440 ml    CBG (last 3)   Recent Labs  01/21/13 1621 01/21/13 2107 01/22/13 0752  GLUCAP 236* 119* 113*    General: alert and oriented to person, time and place. Slow speech HEENT: PERRLA, moist mucous membranes CV: S1S2, RRR, no murmur Pulm: CTA B/L Abd: soft, diffusely uncomfortable with palpation, no rebound, no guarding Ext: no edema Neuro: slow speech, flat affect  Labs and Imaging  Recent Labs Lab 01/20/13 0333 01/21/13 0446 01/22/13 0610  WBC 11.7* 7.1 8.8  HGB 11.1* 11.2* 12.0  HCT 30.5* 30.6* 33.0*  PLT 340 309 351     Recent Labs Lab 01/21/13 0443 01/21/13 1534 01/22/13 0610  NA 134* 130* 136  K 2.5* 4.0 3.2*  CL 99 95* 98  CO2 20 20 26   BUN 41* 27* 16  CREATININE 1.66* 0.98 0.75  GLUCOSE 88 244* 113*  CALCIUM 9.0 9.3 9.4   Urinalysis    Component Value Date/Time   COLORURINE YELLOW 01/19/2013 2127   APPEARANCEUR TURBID* 01/19/2013 2127   LABSPEC 1.025 01/19/2013 2127   PHURINE 5.5 01/19/2013 2127   GLUCOSEU 100* 01/19/2013 2127   HGBUR MODERATE* 01/19/2013 2127   HGBUR negative 05/30/2010 0934   BILIRUBINUR MODERATE* 01/19/2013 2127   BILIRUBINUR NEG 11/17/2012 1041   KETONESUR NEGATIVE 01/19/2013 2127    PROTEINUR >300* 01/19/2013 2127   UROBILINOGEN 1.0 01/19/2013 2127   UROBILINOGEN 0.2 11/17/2012 1041   NITRITE POSITIVE* 01/19/2013 2127   NITRITE NEG 11/17/2012 1041   LEUKOCYTESUR MODERATE* 01/19/2013 2127   Urine culture:  01/19/13: >100,000 yeast 01/02/13: > 100,000 E-coli   Blood culture:  01/19/13: no growth to date  Assessment and Plan 57 yo female with h/o HTN, DM-2, schizophrena who presented with altered mental status and found to be in septic shock.   # Septic shock secondary to UTI  - In the ED patient found to have leukocytosis, hyponatremia, metabolic acidosis with bicarb of 18, Creatinine of 7.77 and UA positive for nitrites, moderate leukocytes, and many bacteria.  - MAP remained below 65 despite aggressive fluid resuscitation (4L).  - Patient treated empirically with CTX and Zosyn and CCM was called.  - Patient now on our service as of 3/21.  - Blood culture negative to date, Urine Cx revealed E. Coli (see above) and yeast.  - Received ceftriaxone today (start: 01/20/13) for 3 doses. D/C ceftriaxone and transition to keflex.  - start diflucan x14 days for yeast UTI  # Acute Kidney Injury  - Secondary to septic shock/hypovolemia.  - Creatinine markedly improved - 0.75 today - patient eating and drinking. D/C fluids and monitor Cr tomorrow.  -  restart lisinopril and HCTZ and monitor Cr.  # Acute delirium  - Appears resolved.  - start back home meds.  # Hyponatremia - secondary to hypovolemia  - resolved  # Hypokalemia and Hypomagnesemia  - Mg 1.7 after 6gm Mag supplement. - K low at 3.2 today. Replete with KDur - recheck BMP # DM-2  - SSI, sensitive  - patient to be discharged home on metformin if Cr remains normal  - CBG's TIDAC and QHS  # HTN  - stop metoprolol and restart home lisinopril/hctz.  - Hydralazine PRN  FEN/GI: d/c fluids PPx: Heparin SQ  Dispo: Pending clinical improvement. PT/OT consult today Code Status: Full   Marena Chancy,  PGY-2 Family Medicine Resident

## 2013-01-22 NOTE — Progress Notes (Signed)
Family Medicine Teaching Service Interim Note: Rectal exam done due to report of fecal incontinence.  Patient consented to procedure.  Normal rectal tone on exam.   Marena Chancy, PGY-2 Family Medicine Resident

## 2013-01-22 NOTE — Progress Notes (Signed)
FMTS Attending Daily Note: Shonnie Poudrier MD [phone] pager office [phone] I  have seen and examined this patient, reviewed their chart. I have discussed this patient with the resident. I agree with the resident's findings, assessment and care plan. 

## 2013-01-23 ENCOUNTER — Inpatient Hospital Stay (HOSPITAL_COMMUNITY): Payer: Medicare Other

## 2013-01-23 LAB — RENAL FUNCTION PANEL
BUN: 12 mg/dL (ref 6–23)
CO2: 27 mEq/L (ref 19–32)
Chloride: 96 mEq/L (ref 96–112)
Creatinine, Ser: 0.72 mg/dL (ref 0.50–1.10)
GFR calc non Af Amer: >90 mL/min (ref >90)
Glucose, Bld: 108 mg/dL — ABNORMAL HIGH (ref 70–99)

## 2013-01-23 LAB — GLUCOSE, CAPILLARY
Glucose-Capillary: 134 mg/dL — ABNORMAL HIGH (ref 70–99)
Glucose-Capillary: 88 mg/dL (ref 70–99)

## 2013-01-23 LAB — CLOSTRIDIUM DIFFICILE BY PCR: Toxigenic C. Difficile by PCR: NEGATIVE

## 2013-01-23 MED ORDER — MAGNESIUM SULFATE 40 MG/ML IJ SOLN
2.0000 g | Freq: Once | INTRAMUSCULAR | Status: AC
  Administered 2013-01-23: 2 g via INTRAVENOUS
  Filled 2013-01-23: qty 50

## 2013-01-23 MED ORDER — IBUPROFEN 600 MG PO TABS
600.0000 mg | ORAL_TABLET | Freq: Once | ORAL | Status: AC
  Administered 2013-01-23: 600 mg via ORAL
  Filled 2013-01-23: qty 1

## 2013-01-23 MED ORDER — IBUPROFEN 600 MG PO TABS
600.0000 mg | ORAL_TABLET | Freq: Four times a day (QID) | ORAL | Status: DC | PRN
  Administered 2013-01-23 – 2013-01-24 (×2): 600 mg via ORAL
  Filled 2013-01-23 (×3): qty 1

## 2013-01-23 NOTE — Progress Notes (Signed)
FMTS Daily Intern Progress Note: pager: [phone]  Subjective:  Nurse reports episode of right cheeck twitching with left arm shaking and patient not responsive this morning, followed by a similar 45sec episode. Since then, patient has been her normal self answering questions appropriately.  Patient reports being incontinent of stool and urine at home, requiring diapers.   I have reviewed the patient's medications.  Objective Temp:  [97.7 F (36.5 C)-98.1 F (36.7 C)] 97.7 F (36.5 C) (03/23 0849) Pulse Rate:  [86-110] 110 (03/23 0849) Resp:  [20-21] 21 (03/23 0849) BP: (100-144)/(77-99) 118/91 mmHg (03/23 0849) SpO2:  [96 %-100 %] 98 % (03/23 0849) Weight:  [166 lb 9.6 oz (75.569 kg)] 166 lb 9.6 oz (75.569 kg) (03/22 2112)   Intake/Output Summary (Last 24 hours) at 01/23/13 1147 Last data filed at 01/23/13 0850  Gross per 24 hour  Intake   1940 ml  Output      0 ml  Net   1940 ml    CBG (last 3)   Recent Labs  01/22/13 2109 01/23/13 0734 01/23/13 1120  GLUCAP 123* 108* 132*    General: alert and oriented to person, time and place. Normal speech, normal affect. Much improved compared to yesterday HEENT: PERRLA, moist mucous membranes CV: S1S2, RRR, no murmur Pulm: CTA B/L Abd: soft, non tender, non distended Neuro: CN2-12 grossly intact, good grip strength.   Labs and Imaging  Recent Labs Lab 01/20/13 0333 01/21/13 0446 01/22/13 0610  WBC 11.7* 7.1 8.8  HGB 11.1* 11.2* 12.0  HCT 30.5* 30.6* 33.0*  PLT 340 309 351     Recent Labs Lab 01/21/13 1534 01/22/13 0610 01/23/13 0520  NA 130* 136 135  K 4.0 3.2* 3.7  CL 95* 98 96  CO2 20 26 27   BUN 27* 16 12  CREATININE 0.98 0.75 0.72  GLUCOSE 244* 113* 108*  CALCIUM 9.3 9.4 9.3   Urinalysis    Component Value Date/Time   COLORURINE YELLOW 01/19/2013 2127   APPEARANCEUR TURBID* 01/19/2013 2127   LABSPEC 1.025 01/19/2013 2127   PHURINE 5.5 01/19/2013 2127   GLUCOSEU 100* 01/19/2013 2127   HGBUR  MODERATE* 01/19/2013 2127   HGBUR negative 05/30/2010 0934   BILIRUBINUR MODERATE* 01/19/2013 2127   BILIRUBINUR NEG 11/17/2012 1041   KETONESUR NEGATIVE 01/19/2013 2127   PROTEINUR >300* 01/19/2013 2127   UROBILINOGEN 1.0 01/19/2013 2127   UROBILINOGEN 0.2 11/17/2012 1041   NITRITE POSITIVE* 01/19/2013 2127   NITRITE NEG 11/17/2012 1041   LEUKOCYTESUR MODERATE* 01/19/2013 2127   Urine culture:  01/19/13: >100,000 yeast 01/02/13: > 100,000 E-coli   Blood culture:  01/19/13: no growth to date  c-diff negative  Assessment and Plan 57 yo female with h/o HTN, DM-2, schizophrena who presented with altered mental status and found to be in septic shock.   # Septic shock secondary to UTI  - In the ED patient found to have leukocytosis, hyponatremia, metabolic acidosis with bicarb of 18, Creatinine of 7.77 and UA positive for nitrites, moderate leukocytes, and many bacteria.  - MAP remained below 65 despite aggressive fluid resuscitation (4L).  - Patient treated empirically with CTX and Zosyn and CCM was called.  - Blood culture negative to date, Urine Cx revealed E. Coli (see above) and yeast.  - Received ceftriaxone(start: 01/20/13) for 3 doses. D/C ceftriaxone. Transitioned to keflex 3/22 and stable on it.  - started diflucan x14 days for yeast UTI  # Acute Kidney Injury  - Secondary to septic shock/hypovolemia.  -  resolved and Cr stable off of IV fluids and on lisinopril and HCTZ.   # Acute delirium and altered mental status: short episode this morning of tremor and blank stare, quickly resolved: seizure vs cogentin side effect?  - Appears resolved.  - CT head negative # Hyponatremia - secondary to hypovolemia  - resolved  # Hypokalemia and Hypomagnesemia  - Mg low today. Replete with 2gm.  - K stable - recheck BMP in am # DM-2  - SSI, sensitive  - patient to be discharged home on metformin if Cr remains normal  - CBG's TIDAC and QHS  # HTN  - BP stable on lisinopril/hctz - Hydralazine  PRN  FEN/GI: d/c fluids PPx: Heparin SQ  Dispo: Pending clinical improvement. PT/OT consult tomorrow to re-evaluate SNF placement as patient significantly improved.  Code Status: Full   Marena Chancy, PGY-2 Family Medicine Resident

## 2013-01-23 NOTE — Progress Notes (Signed)
FMTS Attending Daily Note: Denny Levy MD [phone] pager office [phone] I  have seen and examined this patient, reviewed their chart. I have discussed this patient with the resident. I agree with the resident's findings, assessment and care plan. She seems at her described baseline today. She does have odd affect. Will follow for any new symptoms that would concern [place] for siezure. Have restarted her congentin as she has depo antipsychotic.

## 2013-01-24 DIAGNOSIS — A419 Sepsis, unspecified organism: Secondary | ICD-10-CM

## 2013-01-24 DIAGNOSIS — R410 Disorientation, unspecified: Secondary | ICD-10-CM

## 2013-01-24 DIAGNOSIS — N179 Acute kidney failure, unspecified: Secondary | ICD-10-CM

## 2013-01-24 HISTORY — DX: Disorientation, unspecified: R41.0

## 2013-01-24 HISTORY — DX: Acute kidney failure, unspecified: N17.9

## 2013-01-24 HISTORY — DX: Sepsis, unspecified organism: A41.9

## 2013-01-24 LAB — BASIC METABOLIC PANEL
Calcium: 9.3 mg/dL (ref 8.4–10.5)
Creatinine, Ser: 0.81 mg/dL (ref 0.50–1.10)
GFR calc Af Amer: >90 mL/min (ref >90)

## 2013-01-24 LAB — RENAL FUNCTION PANEL
BUN: 12 mg/dL (ref 6–23)
CO2: 26 mEq/L (ref 19–32)
Calcium: 9.4 mg/dL (ref 8.4–10.5)
GFR calc Af Amer: >90 mL/min (ref >90)
Glucose, Bld: 98 mg/dL (ref 70–99)
Sodium: 132 mEq/L — ABNORMAL LOW (ref 135–145)

## 2013-01-24 LAB — CBC
Hemoglobin: 12.2 g/dL (ref 12.0–15.0)
MCH: 30.3 pg (ref 26.0–34.0)
RBC: 4.03 MIL/uL (ref 3.87–5.11)

## 2013-01-24 LAB — GLUCOSE, CAPILLARY
Glucose-Capillary: 95 mg/dL (ref 70–99)
Glucose-Capillary: 114 mg/dL — ABNORMAL HIGH (ref 70–99)

## 2013-01-24 MED ORDER — CEPHALEXIN 500 MG PO CAPS: 500.0000 mg | ORAL_CAPSULE | Freq: Three times a day (TID) | ORAL | Status: DC

## 2013-01-24 MED ORDER — POTASSIUM CHLORIDE CRYS ER 20 MEQ PO TBCR
40.0000 meq | EXTENDED_RELEASE_TABLET | ORAL | Status: AC
  Administered 2013-01-24 (×2): 40 meq via ORAL
  Filled 2013-01-24: qty 4

## 2013-01-24 MED ORDER — FLUCONAZOLE 200 MG PO TABS: 200.0000 mg | ORAL_TABLET | Freq: Every day | ORAL | Status: DC

## 2013-01-24 NOTE — Care Management Note (Signed)
   CARE MANAGEMENT NOTE 01/24/2013  Patient:  Krista [name], Krista [name]   Account Number:  [id number]  Date Initiated:  01/20/2013  Documentation initiated by:  [facility]  Subjective/Objective Assessment:   admitted with sepsis from UTI -  lives with family - dependent on them.     Action/Plan:   3/24 Met with pt had had walked with PT to setup [facility] needs. Pt lives with mother and brother, brother has back problems.  Plans to return to home with HHPT, selected [facility] , CM ordering RW.   Anticipated DC Date:  01/24/2013   Anticipated DC Plan:  HOME W HOME HEALTH SERVICES      DC Planning Services  CM consult      Choice offered to / List presented to:     DME arranged  [facility]st      DME agency  [facility]     [facility] arranged  HH-2 PT  HH-1 RN      [facility] agency  [facility]   Status of service:  Completed, signed off Medicare Important Message given?   (If response is "NO", the following Medicare IM given date fields will be blank) Date Medicare IM given:   Date Additional Medicare IM given:    Discharge Disposition:  HOME W HOME HEALTH SERVICES  Per UR Regulation:  Reviewed for med. necessity/level of care/duration of stay  If discussed at Long Length of Stay Meetings, dates discussed:    Comments:  Contact:  Ward,Shirley   [phone]                 Christella Scheuermann   [phone]  01/21/2013  [address] 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  NCM to continue to follow regarding medical status and discharge planning.

## 2013-01-24 NOTE — Discharge Summary (Signed)
Family Medicine Teaching [facility] Discharge Summary  Patient name: Krista [name] Medical record number: [id number] Date of birth: [birth year] Age: 57 y.o. Gender: female Date of Admission: 01/19/2013  Date of Discharge: 01/24/13 Admitting Physician: Kalman Shan, MD  Primary Care Provider: Ardyth Gal, MD  Indication for Hospitalization: Altered mental status, Sepsis  Discharge Diagnoses:  Principal Problem:   Severe sepsis with septic shock Active Problems:   DIABETES MELLITUS, TYPE II, WITH RETINOPATHY   HYPERTENSION, BENIGN SYSTEMIC   UTI (lower urinary tract infection)   Acute delirium   Hypokalemia   Acute kidney injury   Hyponatremia  Brief Hospital Course:  57 yo female with h/o HTN, DM-2, schizophrena who presented with altered mental status and found to be in septic shock.   1) Severe sepsis with Septic shock secondary to UTI - In the ED patient found to have leukocytosis, hyponatremia, metabolic acidosis with bicarb of 18, Creatinine of 7.77 and UA positive for nitrites, moderate leukocytes, and many bacteria.  - MAP remained below 65 despite aggressive fluid resuscitation (4L). - Patient treated empirically with CTX and Zosyn and CCM was called.  Patient subsequently placed on levophed and aggressively hydrated with IV fluids.  Patient improved and was later transferred to a Telemetry bed. -  Patient was continued on CTX for 3 days at which time culture results with sensitivities returned [Urine Cx revealed E. Coli (pan sensitive excluding ampicillin) and Yeast].  Yeast was treated with Diflucan. - Patient discharged on Keflex and Diflucan to complete a 10 day and 14 day course respectively.  2) Acute kidney injury - secondary to hypovolemia - Creatinine markedly elevated on admission - 7.77. - Following aggressive IV fluids creatinine improved to 0.81.  3) Acute delirium - CT head was negative. - Resolved following treatment of sepsis  4) DM-2 -  Patient was treated with SSI during admission.  5) HTN  - Following improvement in hypotension home Lisinopril and HCTZ was restarted.  6) Hypokalemia - Resolved following oral repletion.  7) Hyponatremia - Improved following IV fluids.  Significant Labs and Imaging:   CBC BMET   Recent Labs Lab 01/21/13 0446 01/22/13 0610 01/24/13 0555  WBC 7.1 8.8 8.9  HGB 11.2* 12.0 12.2  HCT 30.6* 33.0* 34.7*  PLT 309 351 322    Recent Labs Lab 01/23/13 0520 01/24/13 0555 01/24/13 1437  NA 135 132* 129*  K 3.7 3.3* 4.0  CL 96 93* 90*  CO2 27 26 25   BUN 12 12 13   CREATININE 0.72 0.76 0.81  GLUCOSE 108* 98 191*  CALCIUM 9.3 9.4 9.3     Urinalysis    Component Value Date/Time   COLORURINE YELLOW 01/19/2013 2127   APPEARANCEUR TURBID* 01/19/2013 2127   LABSPEC 1.025 01/19/2013 2127   PHURINE 5.5 01/19/2013 2127   GLUCOSEU 100* 01/19/2013 2127   HGBUR MODERATE* 01/19/2013 2127   HGBUR negative 05/30/2010 0934   BILIRUBINUR MODERATE* 01/19/2013 2127   BILIRUBINUR NEG 11/17/2012 1041   KETONESUR NEGATIVE 01/19/2013 2127   PROTEINUR >300* 01/19/2013 2127   UROBILINOGEN 1.0 01/19/2013 2127   UROBILINOGEN 0.2 11/17/2012 1041   NITRITE POSITIVE* 01/19/2013 2127   NITRITE NEG 11/17/2012 1041   LEUKOCYTESUR MODERATE* 01/19/2013 2127   Results for orders placed during the hospital encounter of 01/19/13  CULTURE, BLOOD (ROUTINE X 2)     Status: None   Collection Time    01/19/13  8:52 PM      Result Value Range Status   Specimen Description BLOOD  ARM RIGHT   Final   Special Requests BOTTLES DRAWN AEROBIC ONLY 5CC   Final   Culture  Setup Time 01/20/2013 02:44   Final   Culture     Final   Value:        BLOOD CULTURE RECEIVED NO GROWTH TO DATE CULTURE WILL BE HELD FOR 5 DAYS BEFORE ISSUING A FINAL NEGATIVE REPORT   Report Status PENDING   Incomplete  CULTURE, BLOOD (ROUTINE X 2)     Status: None   Collection Time    01/19/13  9:05 PM      Result Value Range Status   Specimen Description  BLOOD ARM RIGHT   Final   Special Requests BOTTLES DRAWN AEROBIC ONLY 5CC   Final   Culture  Setup Time 01/20/2013 02:44   Final   Culture     Final   Value:        BLOOD CULTURE RECEIVED NO GROWTH TO DATE CULTURE WILL BE HELD FOR 5 DAYS BEFORE ISSUING A FINAL NEGATIVE REPORT   Report Status PENDING   Incomplete  URINE CULTURE     Status: None   Collection Time    01/19/13  9:27 PM      Result Value Range Status   Specimen Description URINE, RANDOM   Final   Special Requests NONE   Final   Culture  Setup Time 01/19/2013 22:16   Final   Colony Count >=100,000 COLONIES/ML   Final   Culture YEAST   Final   Report Status 01/21/2013 FINAL   Final  MRSA PCR SCREENING     Status: None   Collection Time    01/20/13  3:36 AM      Result Value Range Status   MRSA by PCR NEGATIVE  NEGATIVE Final   Comment:            The GeneXpert MRSA Assay (FDA     approved for NASAL specimens     only), is one component of a     comprehensive MRSA colonization     surveillance program. It is not     intended to diagnose MRSA     infection nor to guide or     monitor treatment for     MRSA infections.  CLOSTRIDIUM DIFFICILE BY PCR     Status: None   Collection Time    01/23/13  2:17 AM      Result Value Range Status   C difficile by pcr NEGATIVE  NEGATIVE Final   Ct Head Wo Contrast  01/23/2013 IMPRESSION: No evidence of acute intracranial abnormality.   Ct Head Wo Contrast  01/19/2013 IMPRESSION: 1. Normal intracranially. 2. Probable calcified sebaceous cyst in the right frontal scalp near the vertex.   US Renal  01/20/2013 IMPRESSION: Slight increased echogenicity of both kidneys suggesting medical renal disease. No hydronephrosis.  Procedures: Central line placement  Consultations: None  Discharge Medications:    Medication List    STOP taking these medications       ciprofloxacin 500 MG tablet  Commonly known as:  CIPRO      TAKE these medications       aspirin EC 81 MG tablet   Take 81 mg by mouth daily.     benztropine 1 MG tablet  Commonly known as:  COGENTIN  Take 1 tablet (1 mg total) by mouth at bedtime.     cephALEXin 500 MG capsule  Commonly known as:  KEFLEX  Take 1 capsule (500  mg total) by mouth every 8 (eight) hours.     fluconazole 200 MG tablet  Commonly known as:  DIFLUCAN  Take 1 tablet (200 mg total) by mouth daily.     fluPHENAZine decanoate 25 MG/ML injection  Commonly known as:  PROLIXIN  Inject 25 mg into the muscle every 14 (fourteen) days. No instruction given     gabapentin 300 MG capsule  Commonly known as:  NEURONTIN  Take 1 capsule (300 mg total) by mouth 3 (three) times daily.     gemfibrozil 600 MG tablet  Commonly known as:  LOPID  Take 1 tablet (600 mg total) by mouth 2 (two) times daily.     ibuprofen 600 MG tablet  Commonly known as:  ADVIL,MOTRIN  take 1 tablet by mouth twice a day if needed for pain     lisinopril-hydrochlorothiazide 20-25 MG per tablet  Commonly known as:  PRINZIDE,ZESTORETIC  Take 1 tablet by mouth every morning.     metFORMIN 500 MG tablet  Commonly known as:  GLUCOPHAGE  Take 500-10,000 mg by mouth 2 (two) times daily with a meal. 2 tabs by mouth qam and 1 tab by mouth qhs     niacin 750 MG CR tablet  Commonly known as:  NIASPAN  Take 750 mg by mouth at bedtime.     omeprazole 40 MG capsule  Commonly known as:  PRILOSEC  Take 40 mg by mouth every morning.     oxybutynin 5 MG tablet  Commonly known as:  DITROPAN  Take 5 mg by mouth 2 (two) times daily.     oxyCODONE-acetaminophen 5-325 MG per tablet  Commonly known as:  PERCOCET  Take 2 tablets by mouth every 4 (four) hours as needed for pain.       Issues for Follow Up:  1) Completion of antibiotic course 2) Compliance with Fluphenazine 3) Recommend follow up BMP to assess potassium.  Outstanding Results: Final Blood Culture results  Discharge Instructions:  Patient was counseled important signs and symptoms that should  prompt return to medical care, changes in medications, dietary instructions, activity restrictions, and follow up appointments.   Follow-up Information   Follow up with [facility], MD On 01/31/2013. (145 pm)    Contact information:   [address] [place] [address] [phone]       Discharge Condition: Stable. Discharged home.   [name], DO 01/24/2013, 7:39 PM

## 2013-01-24 NOTE — Progress Notes (Signed)
Physical Therapy Treatment Patient Details Name: Krista [name] MRN: [id number] DOB: 07/16/1956 Today's Date: 01/24/2013 Time: 4098-1191 PT Time Calculation (min): 15 min  PT Assessment / Plan / Recommendation Comments on Treatment Session  Pt very pleasant and cooperative, happy to walk. She did well with RW and agrees it will help her at home although she would like to return to previous status of mobility without device. Will update d/c plan to HHPT with RW     Follow Up Recommendations  Home health PT     Does the patient have the potential to tolerate intense rehabilitation     Barriers to Discharge        Equipment Recommendations  Rolling walker with 5" wheels    Recommendations for Other Services    Frequency Min 2X/week   Plan Discharge plan needs to be updated;Equipment recommendations need to be updated    Precautions / Restrictions     Pertinent Vitals/Pain No c/o pain    Mobility  Bed Mobility Bed Mobility: Rolling Right;Rolling Left;Sit to Supine;Scooting to [facility] Rolling Right: 4: Min assist Rolling Left: 4: Min assist Sit to Supine: 1: +2 Total assist Sit to Supine: Patient Percentage: 40% Scooting to HOB: 1: +2 Total assist Scooting to [facility]: Patient Percentage: 20% Transfers Transfers: Sit to Stand;Stand to Sit Sit to Stand: With armrests;From chair/3-in-1;5: Supervision Sit to Stand: Patient Percentage: 50% Stand to Sit: To chair/3-in-1;With armrests;5: Supervision Stand to Sit: Patient Percentage: 30% Details for Transfer Assistance: pt able to use RW well for support Ambulation/Gait Ambulation/Gait Assistance: 5: Supervision Ambulation Distance (Feet): 150 Feet Assistive device: Rolling walker Ambulation/Gait Assistance Details: pt pushed RW out in front Gait Pattern: Step-through pattern;Trunk flexed Gait velocity: WFL General Gait Details: pt maintains trunk flexion (previous kyphosis evident) but is safe with RW Stairs: No Wheelchair  Mobility Wheelchair Mobility: No    Exercises     PT Diagnosis:    PT Problem List:   PT Treatment Interventions:     PT Goals Acute Rehab PT Goals PT Goal Formulation: Patient unable to participate in goal setting Time For Goal Achievement: 01/29/13 Potential to Achieve Goals: Fair Pt will go Sit to Stand: with supervision PT Goal: Sit to Stand - Progress: Met Pt will go Stand to Sit: with supervision PT Goal: Stand to Sit - Progress: Met Pt will Ambulate: >150 feet;with supervision PT Goal: Ambulate - Progress: Met  Visit Information  Last PT Received On: 01/24/13 Assistance Needed: +1    Subjective Data  Subjective: If feels good to walk Patient Stated Goal: to go home   Cognition  Cognition Overall Cognitive Status: History of cognitive impairments - at baseline Arousal/Alertness: Awake/alert Orientation Level: Disoriented X4 Behavior During Session: Anxious    Balance  Balance Balance Assessed: Yes Static Sitting Balance Static Sitting - Balance Support: No upper extremity supported Static Sitting - Level of Assistance: 7: Independent Static Standing Balance Static Standing - Balance Support: Bilateral upper extremity supported Static Standing - Level of Assistance: 6: Modified independent (Device/Increase time)  End of Session PT - End of Session Activity Tolerance: Patient tolerated treatment well Patient left: with chair alarm set Nurse Communication: Mobility status   GP    Bayard Hugger. [place], [place] [phone]  01/24/2013, 2:54 PM

## 2013-01-24 NOTE — Progress Notes (Signed)
FMTS Attending Admission Note: Krista Rossano,MD I  have seen and examined this patient, reviewed their chart. I have discussed this patient with the resident. I agree with the resident's findings, assessment and care plan.  Patient assessed by me this morning,looks good,only complaint was back pain,exam significant for lumbar spine tenderness,with mild flank tenderness.Resident to reassess back pain,if persistent to consider imaging or repeat UA (flank pain).

## 2013-01-24 NOTE — Progress Notes (Signed)
Family Medicine Teaching Service Daily Progress Note Service Page: [phone]  Subjective:  Feeling well this am. Reports low back pain this am. No SOB, chest pain.  Objective: Temp:  [97.7 F (36.5 C)-98.4 F (36.9 C)] 98.1 F (36.7 C) (03/24 0849) Pulse Rate:  [80-112] 106 (03/24 0849) Resp:  [18-20] 18 (03/24 0849) BP: (107-126)/(74-84) 126/77 mmHg (03/24 0849) SpO2:  [97 %-100 %] 100 % (03/24 0849) Weight:  [168 lb 10.4 oz (76.5 kg)] 168 lb 10.4 oz (76.5 kg) (03/23 2032)  Exam: General: well appearing, NAD. Heart: RRR, no murmurs, rubs, or gallops. Lungs: CTAB. No rales, rhonchi, or wheeze. Abdomen: soft, nontender, nondistended.  Extremities: no cyanosis, clubbing, or edema. Skin: no rashes or lesions. Psych: normal mood and affect. Neuro: no focal deficits.    CBC BMET   Recent Labs Lab 01/21/13 0446 01/22/13 0610 01/24/13 0555  WBC 7.1 8.8 8.9  HGB 11.2* 12.0 12.2  HCT 30.6* 33.0* 34.7*  PLT 309 351 322    Recent Labs Lab 01/22/13 0610 01/23/13 0520 01/24/13 0555  NA 136 135 132*  K 3.2* 3.7 3.3*  CL 98 96 93*  CO2 26 27 26   BUN 16 12 12   CREATININE 0.75 0.72 0.76  GLUCOSE 113* 108* 98  CALCIUM 9.4 9.3 9.4     Urinalysis    Component Value Date/Time   COLORURINE YELLOW 01/19/2013 2127   APPEARANCEUR TURBID* 01/19/2013 2127   LABSPEC 1.025 01/19/2013 2127   PHURINE 5.5 01/19/2013 2127   GLUCOSEU 100* 01/19/2013 2127   HGBUR MODERATE* 01/19/2013 2127   HGBUR negative 05/30/2010 0934   BILIRUBINUR MODERATE* 01/19/2013 2127   BILIRUBINUR NEG 11/17/2012 1041   KETONESUR NEGATIVE 01/19/2013 2127   PROTEINUR >300* 01/19/2013 2127   UROBILINOGEN 1.0 01/19/2013 2127   UROBILINOGEN 0.2 11/17/2012 1041   NITRITE POSITIVE* 01/19/2013 2127   NITRITE NEG 11/17/2012 1041   LEUKOCYTESUR MODERATE* 01/19/2013 2127   Urine Culture - >100,000 CFU E. Coli and Yeast Blood Cultures - NTD.  Imaging/Diagnostic Tests:  Ct Head Wo Contrast 01/23/2013  IMPRESSION: No  evidence of acute intracranial abnormality.  Ct Head Wo Contrast 01/19/2013  IMPRESSION:  1.  Normal intracranially. 2.  Probable calcified sebaceous cyst in the right frontal scalp near the vertex.  US Renal 01/20/2013   IMPRESSION: Slight increased echogenicity of both kidneys suggesting medical renal disease.  No hydronephrosis.  Assessment/Plan: 57 yo female with h/o HTN, DM-2, schizophrena who presented with altered mental status and found to be in septic shock.   # Septic shock secondary to UTI  - In the ED patient found to have leukocytosis, hyponatremia, metabolic acidosis with bicarb of 18, Creatinine of 7.77 and UA positive for nitrites, moderate leukocytes, and many bacteria.  - MAP remained below 65 despite aggressive fluid resuscitation (4L).  - Patient treated empirically with CTX and Zosyn and CCM was called.  - Blood culture negative to date, Urine Cx revealed E. Coli (see above) and yeast.  - Received ceftriaxone(start: 01/20/13) for 3 doses. D/C ceftriaxone. Transitioned to keflex 3/22.  Will continue at discharge.   - Will also continue Diflucan   # Acute Kidney Injury  - Secondary to septic shock/hypovolemia.  - Now resolved. Creatinine 0.76 today.  # Acute delirium  - CT head negative  - Currently resolved.  Patient AO x 3 this am.  # Hyponatremia - secondary to hypovolemia  - Mild hyponatremia noted this am - 132. - BP stable this am.  Holding HCTZ.  #  Hypokalemia  - K+ 3.2 this am.  Repleting orally.  # DM-2  - SSI, sensitive  - CBG's TIDAC and QHS  - patient to be discharged home on metformin   # HTN  - BP stable on this am.   - Holding HCTZ in the setting of hyponatremia.  FEN/GI: Heart healthy diet PPx: Heparin SQ  Dispo: Pending PT evaluation and ? SNF placement vs. D/C home. Code Status: Full code   [name], DO 01/24/2013, 9:11 AM

## 2013-01-24 NOTE — Clinical Social Work Note (Signed)
Message received from MD requesting that CSW talk with patient to assure she will have support and will be safe at home. CSW consulted with nurse case manager C. Royal who had talked with patient about discharging home and her family supports and patient lives with mother and brother who can offer some support, and she also has a sister who does not live in the home.   CSW talked with patient regarding her transportation home and her mother will pay for a taxi to transport her home. This information shared with patient's nurse. CSW signing off as patient discharging home and she will have adequate support once home.  Genelle Bal, MSW, LCSW [phone]

## 2013-01-24 NOTE — Progress Notes (Signed)
OT Cancellation Note  Patient Details Name: Krista [name] MRN: [id number] DOB: 31-Jan-1956   Cancelled Treatment:    Reason Eval/Treat Not Completed: Other (comment) Will defer OT to SNF. If eval needed, or D/C plan changes to home, please reorder. Thank you  [facility] Alysah Carton, OTR/L  [phone] 01/24/2013 01/24/2013, 9:48 AM

## 2013-01-25 NOTE — Discharge Summary (Signed)
FMTS Attending Admission Note: Meagan Ancona,MD I  have seen and examined this patient, reviewed their chart. I have discussed this patient with the resident. I agree with the resident's findings, assessment and care plan.  

## 2013-01-26 ENCOUNTER — Telehealth: Payer: Self-pay | Admitting: Internal Medicine

## 2013-01-26 LAB — CULTURE, BLOOD (ROUTINE X 2)
Culture: NO GROWTH
Culture: NO GROWTH

## 2013-01-26 NOTE — Telephone Encounter (Signed)
pcp is CHAMBERLAIN,RACHEL, MD. I think she is with Family practice. I only saw patient from elink or took a call. Alva rounded on patient. REgardless, this issue should be address viad family practice. Please forward tot hat office. I will cc Ardyth Gal, MD on epick   Dr. Kalman Shan, M.D., [facility].C.P Pulmonary and Critical Care Medicine Staff Physician [facility] [place] [facility] Pager: [phone], If no answer or between  15:00h - 7:00h: call [phone]  01/26/2013 11:58 AM

## 2013-01-26 NOTE — Telephone Encounter (Signed)
I spoke with Dewayne Hatch. She stated pt has Psych issue for being non compliant with her medications. She is wanting order for a psych nurse to help pt with these issues. Please advise MR. Looks like pt has been only seen in the hospital. [facility]

## 2013-01-26 NOTE — Telephone Encounter (Signed)
I spoke with Dewayne Hatch and advised of recs. She voiced understanding and needed nothing further. She will call them.  Nothing further was needed

## 2013-01-27 ENCOUNTER — Telehealth: Payer: Self-pay | Admitting: Family Medicine

## 2013-01-27 NOTE — Telephone Encounter (Signed)
Is asking for a psych nurse to come out to eval patient - since patient has schizophrenia - they need orders

## 2013-01-27 NOTE — Telephone Encounter (Signed)
Called and spoke to [facility]- Krista [name] has had some issues with being very compliant and knowing her medications one day and then not knowing them the next day, not being able to speak to nurse on phone.  Krista [name] is requesting having the Psych nurse from their group come to help her with her medications, which I gave verbal orders for.  I also asked for any phone calls if they are concerned about medications side effects and over sedation.    Krista [name] 01/27/2013 1:42 PM

## 2013-01-27 NOTE — Telephone Encounter (Signed)
Will forward to MD for advise.  [name], [name]

## 2013-01-30 ENCOUNTER — Emergency Department (HOSPITAL_COMMUNITY): Payer: Medicare Other

## 2013-01-30 ENCOUNTER — Encounter (HOSPITAL_COMMUNITY): Payer: Self-pay | Admitting: *Deleted

## 2013-01-30 ENCOUNTER — Inpatient Hospital Stay (HOSPITAL_COMMUNITY)
Admission: EM | Admit: 2013-01-30 | Discharge: 2013-02-03 | DRG: 683 | Disposition: A | Discharge status: Home / Self Care | Payer: Medicare Other | Attending: Family Medicine | Admitting: Family Medicine

## 2013-01-30 DIAGNOSIS — E1139 Type 2 diabetes mellitus with other diabetic ophthalmic complication: Secondary | ICD-10-CM

## 2013-01-30 DIAGNOSIS — E871 Hypo-osmolality and hyponatremia: Secondary | ICD-10-CM | POA: Diagnosis not present

## 2013-01-30 DIAGNOSIS — I10 Essential (primary) hypertension: Secondary | ICD-10-CM | POA: Diagnosis present

## 2013-01-30 DIAGNOSIS — N390 Urinary tract infection, site not specified: Secondary | ICD-10-CM

## 2013-01-30 DIAGNOSIS — N179 Acute kidney failure, unspecified: Principal | ICD-10-CM | POA: Diagnosis present

## 2013-01-30 DIAGNOSIS — E1142 Type 2 diabetes mellitus with diabetic polyneuropathy: Secondary | ICD-10-CM | POA: Diagnosis present

## 2013-01-30 DIAGNOSIS — R410 Disorientation, unspecified: Secondary | ICD-10-CM

## 2013-01-30 DIAGNOSIS — K219 Gastro-esophageal reflux disease without esophagitis: Secondary | ICD-10-CM | POA: Diagnosis present

## 2013-01-30 DIAGNOSIS — J40 Bronchitis, not specified as acute or chronic: Secondary | ICD-10-CM

## 2013-01-30 DIAGNOSIS — R109 Unspecified abdominal pain: Secondary | ICD-10-CM | POA: Diagnosis present

## 2013-01-30 DIAGNOSIS — M129 Arthropathy, unspecified: Secondary | ICD-10-CM | POA: Diagnosis present

## 2013-01-30 DIAGNOSIS — E1149 Type 2 diabetes mellitus with other diabetic neurological complication: Secondary | ICD-10-CM | POA: Diagnosis present

## 2013-01-30 DIAGNOSIS — Z79899 Other long term (current) drug therapy: Secondary | ICD-10-CM

## 2013-01-30 DIAGNOSIS — M8448XA Pathological fracture, other site, initial encounter for fracture: Secondary | ICD-10-CM | POA: Diagnosis present

## 2013-01-30 DIAGNOSIS — R4789 Other speech disturbances: Secondary | ICD-10-CM | POA: Diagnosis not present

## 2013-01-30 DIAGNOSIS — F17200 Nicotine dependence, unspecified, uncomplicated: Secondary | ICD-10-CM | POA: Diagnosis present

## 2013-01-30 DIAGNOSIS — R079 Chest pain, unspecified: Secondary | ICD-10-CM

## 2013-01-30 DIAGNOSIS — E785 Hyperlipidemia, unspecified: Secondary | ICD-10-CM | POA: Diagnosis present

## 2013-01-30 DIAGNOSIS — F205 Residual schizophrenia: Secondary | ICD-10-CM | POA: Diagnosis present

## 2013-01-30 LAB — TROPONIN I: Troponin I: <0.3 ng/mL

## 2013-01-30 LAB — URINALYSIS, MICROSCOPIC ONLY
Glucose, UA: NEGATIVE mg/dL
Ketones, ur: NEGATIVE mg/dL
Specific Gravity, Urine: 1.012 (ref 1.005–1.030)
pH: 6 (ref 5.0–8.0)

## 2013-01-30 LAB — COMPREHENSIVE METABOLIC PANEL
Albumin: 3.7 g/dL (ref 3.5–5.2)
Alkaline Phosphatase: 148 U/L — ABNORMAL HIGH (ref 39–117)
BUN: 21 mg/dL (ref 6–23)
Calcium: 10 mg/dL (ref 8.4–10.5)
Potassium: 4.2 mEq/L (ref 3.5–5.1)
Sodium: 133 mEq/L — ABNORMAL LOW (ref 135–145)
Total Protein: 7.2 g/dL (ref 6.0–8.3)

## 2013-01-30 LAB — CBC WITH DIFFERENTIAL/PLATELET
Basophils Relative: 0 % (ref 0–1)
Eosinophils Absolute: 0.1 10*3/uL (ref 0.0–0.7)
MCH: 30.8 pg (ref 26.0–34.0)
MCHC: 35 g/dL (ref 30.0–36.0)
Monocytes Relative: 7 % (ref 3–12)
Neutrophils Relative %: 53 % (ref 43–77)
Platelets: 395 10*3/uL (ref 150–400)

## 2013-01-30 LAB — LACTIC ACID, PLASMA: Lactic Acid, Venous: 0.8 mmol/L (ref 0.5–2.2)

## 2013-01-30 LAB — GLUCOSE, CAPILLARY: Glucose-Capillary: 121 mg/dL — ABNORMAL HIGH (ref 70–99)

## 2013-01-30 MED ORDER — SODIUM CHLORIDE 0.9 % IV SOLN: Freq: Once | INTRAVENOUS | Status: DC

## 2013-01-30 MED ORDER — SODIUM CHLORIDE 0.9 % IV BOLUS (SEPSIS)
1000.0000 mL | Freq: Once | INTRAVENOUS | Status: AC
  Administered 2013-01-30: 1000 mL via INTRAVENOUS

## 2013-01-30 MED ORDER — ALBUTEROL SULFATE (5 MG/ML) 0.5% IN NEBU
5.0000 mg | INHALATION_SOLUTION | Freq: Once | RESPIRATORY_TRACT | Status: AC
  Administered 2013-01-30: 5 mg via RESPIRATORY_TRACT
  Filled 2013-01-30: qty 1

## 2013-01-30 MED ORDER — IPRATROPIUM BROMIDE 0.02 % IN SOLN
0.5000 mg | Freq: Once | RESPIRATORY_TRACT | Status: AC
  Administered 2013-01-30: 0.5 mg via RESPIRATORY_TRACT
  Filled 2013-01-30: qty 2.5

## 2013-01-30 MED ORDER — ALBUTEROL SULFATE (5 MG/ML) 0.5% IN NEBU: 2.5000 mg | INHALATION_SOLUTION | Freq: Once | RESPIRATORY_TRACT | Status: DC

## 2013-01-30 MED ORDER — SODIUM CHLORIDE 0.9 % IV SOLN
1000.0000 mL | Freq: Once | INTRAVENOUS | Status: AC
  Administered 2013-01-31: 1000 mL via INTRAVENOUS

## 2013-01-30 MED ORDER — NITROGLYCERIN 0.4 MG SL SUBL: 0.4000 mg | SUBLINGUAL_TABLET | SUBLINGUAL | Status: DC | PRN

## 2013-01-30 MED ORDER — IOHEXOL 300 MG/ML  SOLN
50.0000 mL | Freq: Once | INTRAMUSCULAR | Status: AC | PRN
  Administered 2013-01-30: 50 mL via ORAL

## 2013-01-30 NOTE — ED Notes (Signed)
JWJ:[id number]<NW> Expected date:01/30/13<BR> Expected time: 9:13 PM<BR> Means of arrival:Ambulance<BR> Comments:<BR> Generalized weakness

## 2013-01-30 NOTE — H&P (Signed)
Family Medicine Teaching [facility] Admission History and Physical Service Pager: [phone]  Patient name: Krista [name] Medical record number: [id number] Date of birth: 19-Mar-1956 Age: 57 y.o. Gender: female  Primary Care Provider: Ardyth Gal, MD  Chief Complaint: Chest pain, Abdominal pain  Assessment and Plan: Krista [name] is a 57 y.o. year old female PMH of HTN, DM-2, GERD and schizophrenia who presents with complaints of chest and abdominal pain.  Patient recently discharged on 01/24/13 following admission for septic shock, AKI, and delirium.  # Chest pain - EKG revealed NSR with no ST or T wave changes. Pain now resolved. - Will admit to telemetry, attending Dr. Sheffield Slider - 1st Troponin negative.  Will cycle additional Troponin x 2. - SL Nitro PRN, Morphine PRN for severe pain. Will continue daily Aspirin. - Will obtain risk stratification labs - Lipid panel, TSH, A1C.  # Abdominal pain - Patient has had recent admission for septic shock secondary to urosepsis.  However, she has no urinary tract symptoms.  DDX - Pyelonephritis (less likely given no fever, leukocytosis, or findings on CT), Constipation, Viral gastroenteritis (denies diarrhea, nausea/vomiting), Inflammatory bowel disease, Colitis (no findings on CT scan). Unclear etiology at this time.   - Acute abdominal series negative.   - CT abdomen/pelvis was negative for intrabdominal/pelvic inflammatory or infectious processes. - Patient was given 1 dose of Cefepime in the ED which would cover for pyelonephritis. - Will continue to monitor closely and treat pain aggressively.  Colace daily for constipation.  # AKI - Creatinine elevated at 1.22 on admission (0.81 on 3/24).  Patient given 1 L NS bolus in the ED. - Likely secondary to poor PO intake (reported by ED provider after talking with patient's mother) - IV fluids: NS @ 100 mL/hr  # Compression fractures - Seen on CT abdomen/pelvis.  Patient denies recent  fall.  Reports back pain currently. - Fractures appear chronic in nature. - Will consider further work up during admission - Consider Calcitonin to treat vertebral compression Fx, however these are not acute  # DM-2 - Will obtain A1C - CBG's TID AC and QHS - Holding SSI at this time at CBG's are well controlled and last A1C (07/2012) was 6.6. - Will continue home Gabapentin  # HTN - BP stable currently - Will hold home Lisinopril/HCTZ in the setting of AKI  # Hyperlipidemia - Awaiting lipid panel  # GERD - Daily Protonix  # Schizophrenia - Will continue home Cogentin  FEN/GI: Carb modified diet; NS @ 100 mL/hr Prophylaxis: Heparin SQ Disposition: Pending clinical improvement Code Status: Full Code  History of Present Illness:  Krista [name] is a 57 y.o. year old female with PMH of HTN, DM-2, HLD, GERD and schizophrenia who presented to [facility] ED for chest pain and abdominal pain. Patient recently discharged on 01/24/13 following admission for septic shock (urosepsis), AKI, and delirium.  Currently, patient complains of abdominal pain that started "a few days ago."  Pain is located LLQ and does not radiate.  She also points to lumbar spine and complains of pain, started a few days ago as well.  Pain is 7/10 in severity.  Denies any nausea/vomiting, constipation, diarrhea.  No fever, chills.  Patient denies any CP or SOB at this time. She also denies dysuria, urinary frequency.  Denies any urinary or bowel incontinence.  Patient able to answer yes or no questions, but she is difficult to understand at times.    Additionally, ED physician reports that patient's  mother states that she has been lethargic with poor PO intake since discharge home.  Patient has been unable to take care of her self at home.   Patient Active Problem List  Diagnosis  . DIABETES MELLITUS, TYPE II, WITH RETINOPATHY  . HYPERTRIGLYCERIDEMIA  . HYPERLIPIDEMIA  . OBESITY, NOS  . PSYCHOSIS, UNSPECIFIED  .  TOBACCO DEPENDENCE  . RETINOPATHY, DIABETIC, BACKGROUND  . HYPERTENSION, BENIGN SYSTEMIC  . SINUS TACHYCARDIA  . GASTROESOPHAGEAL REFLUX, NO ESOPHAGITIS  . HERNIA, HIATAL, NONCONGENITAL  . 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 DEGENERATIVE JOINT DISEASE, BOTH KNEES, SEVERE  . INCONTINENCE, URGE  . SACROILIITIS, RIGHT  . Wheezing symptom  . Diabetic peripheral neuropathy associated with type 2 diabetes mellitus  . Back pain  . Rectal trauma  . UTI (lower urinary tract infection)  . Severe sepsis with septic shock  . Acute delirium  . Hypokalemia  . Acute kidney injury  . Hyponatremia   Past Medical History: Past Medical History  Diagnosis Date  . Arthritis   . Diabetes mellitus   . Hyperlipidemia   . Hypertension   . Allergy   . GERD (gastroesophageal reflux disease)   . Back pain    Past Surgical History: History reviewed. No pertinent past surgical history. Social History: History  Substance Use Topics  . Smoking status: Current Every Day Smoker -- 1.00 packs/day    Types: Cigarettes  . Smokeless tobacco: Never Used  . Alcohol Use: No   Family History: Family History  Problem Relation Age of Onset  . Diabetes Mother   . COPD Mother   . Heart disease Mother   . Hyperlipidemia Mother   . Hypertension Mother   . Arthritis Mother    Allergies: Allergies  Allergen Reactions  . Codeine     REACTION: itching   No current facility-administered medications on file prior to encounter.   Current Outpatient Prescriptions on File Prior to Encounter  Medication Sig Dispense Refill  . aspirin EC 81 MG tablet Take 81 mg by mouth daily.      . cephALEXin (KEFLEX) 500 MG capsule Take 1 capsule (500 mg total) by mouth every 8 (eight) hours.  16 capsule  0  . fluconazole (DIFLUCAN) 200 MG tablet Take 1 tablet (200 mg total) by mouth daily.  11 tablet  0  . fluPHENAZine decanoate (PROLIXIN) 25 MG/ML injection Inject 25 mg into the muscle every 14 (fourteen)  days. No instruction given      . gabapentin (NEURONTIN) 300 MG capsule Take 1 capsule (300 mg total) by mouth 3 (three) times daily.  270 capsule  3  . ibuprofen (ADVIL,MOTRIN) 600 MG tablet take 1 tablet by mouth twice a day if needed for pain  60 tablet  1  . lisinopril-hydrochlorothiazide (PRINZIDE,ZESTORETIC) 20-25 MG per tablet Take 1 tablet by mouth every morning.      . metFORMIN (GLUCOPHAGE) 500 MG tablet Take 500-10,000 mg by mouth 2 (two) times daily with a meal. 2 tabs by mouth qam and 1 tab by mouth qhs      . niacin (NIASPAN) 750 MG CR tablet Take 750 mg by mouth at bedtime.      Marland Kitchen omeprazole (PRILOSEC) 40 MG capsule Take 40 mg by mouth every morning.      Marland Kitchen oxybutynin (DITROPAN) 5 MG tablet Take 5 mg by mouth 2 (two) times daily.        Review Of Systems: Per HPI. Otherwise 12 point review of systems was performed  and was unremarkable.  Physical Exam: BP 125/61  Pulse 97  Temp(Src) 98.8 F (37.1 C) (Oral)  Resp 15  SpO2 96% Exam: General: awake, alert, oriented x 3, in mild distress secondary to pain HEENT: NCAT.  Dry mucous membranes.  Cardiovascular: Tachycardic. Regular rhythm. No murmurs, rubs, or gallops. Respiratory: CTAB. No rales, rhonchi, or wheezing appreciated. Abdomen: soft, slightly tender to palpation in the LLQ, nondistended. +BS. Extremities: warm, well perfused. No edema. MSK: able to flex and extend lower back, but patient complains of worse pain with flexion; lumbar spine is normal to inspection Skin: No rash. Neuro: AO x 3. No focal deficits on exam.  Labs and Imaging: CBC BMET   Recent Labs Lab 01/30/13 2211  WBC 6.9  HGB 11.8*  HCT 33.7*  PLT 395    Recent Labs Lab 01/30/13 2211  NA 133*  K 4.2  CL 94*  CO2 25  BUN 21  CREATININE 1.22*  GLUCOSE 123*  CALCIUM 10.0     Cardiac Panel (last 3 results)  Recent Labs  01/30/13 2211  TROPONINI <0.30   Urinalysis    Component Value Date/Time   COLORURINE YELLOW 01/30/2013 2134    APPEARANCEUR CLEAR 01/30/2013 2134   LABSPEC 1.012 01/30/2013 2134   PHURINE 6.0 01/30/2013 2134   GLUCOSEU NEGATIVE 01/30/2013 2134   HGBUR NEGATIVE 01/30/2013 2134   HGBUR negative 05/30/2010 0934   BILIRUBINUR NEGATIVE 01/30/2013 2134   BILIRUBINUR NEG 11/17/2012 1041   KETONESUR NEGATIVE 01/30/2013 2134   PROTEINUR NEGATIVE 01/30/2013 2134   UROBILINOGEN 0.2 01/30/2013 2134   UROBILINOGEN 0.2 11/17/2012 1041   NITRITE NEGATIVE 01/30/2013 2134   NITRITE NEG 11/17/2012 1041   LEUKOCYTESUR LARGE* 01/30/2013 2134   EKG - NSR. No ST or T wave changes.   Ct Abdomen Pelvis Wo Contrast 01/31/2013  *RADIOLOGY REPORT*  Clinical Data: Diffuse abdominal pain.  Recent admission for septic shock.  CT ABDOMEN AND PELVIS WITHOUT CONTRAST  Technique:  Multidetector CT imaging of the abdomen and pelvis was performed following the standard protocol without intravenous contrast.  Comparison: None.  Findings: The lung bases are clear.  Tortuous abdominal aorta. Surgical absence of the gallbladder.  The unenhanced appearance of the liver, spleen, pancreas, adrenal glands, kidneys, inferior vena cava, and retroperitoneal lymph nodes is unremarkable. Calcification of the abdominal aorta without aneurysm.  The stomach and small bowel are decompressed.  Contrast material flows to the colon without evidence of obstruction.  No colonic wall thickening or distension.  No free air or free fluid in the abdomen.  Pelvis:  The uterus and ovaries are not enlarged.  The bladder wall is not thickened.  No free or loculated pelvic fluid collections. The appendix is not identified.  No evidence of diverticulitis.  No significant pelvic lymphadenopathy.  Degenerative changes throughout the lumbar spine.  Severe compression fractures of T11, L1, L2, and L5 vertebrae with sclerosis.  Sclerosis in the bowel vertebra may be reactive although pathologic fractures due to sclerotic metastasis or renal osteodystrophy is not excluded.  The time course  of the fractures is indeterminate but the presence of degenerative change suggest that they are chronic.  No bone destruction is appreciated.  IMPRESSION: No acute inflammatory process demonstrated in the abdomen or pelvis.  Multiple vertebral compression fractures with sclerosis and degenerative change.  Consider pathologic fractures in the setting of renal osteodystrophy or metastasis.   Dg Abd Acute W/chest 01/30/2013  *RADIOLOGY REPORT*  Clinical Data: Weakness and abdominal pain.  ACUTE ABDOMEN  SERIES (ABDOMEN 2 VIEW & CHEST 1 VIEW)  Comparison: Chest 01/19/2013.  Abdominal series 01/02/2013.  Findings: Shallow inspiration.  Heart size and pulmonary vascularity are normal for technique.  Linear fibrosis or atelectasis in the lung bases, similar to previous study.  No focal consolidation or airspace disease.  No blunting of costophrenic angles.  No pneumothorax.  Mediastinal contours appear intact. Interval removal of central venous catheter since previous study. Degenerative changes in the spine and shoulders.  Normal bowel gas pattern with scattered gas and stool in the colon. No small or large bowel distension.  No free intra-abdominal air. No abnormal air fluid levels.  No radiopaque stones.  Calcified phleboliths in the pelvis.  Surgical clips in the right upper quadrant.  Degenerative changes in the spine and hips.  No significant change since previous study.  IMPRESSION: No evidence of active pulmonary disease.  Nonobstructive bowel gas pattern.     [name], DO 01/30/2013, 11:58 PM  PGY-3 ADDENDUM:  Patient seen and examined with Dr. Adriana Simas.  Available data reviewed.  Agree with findings, assessment, and plan as outlined by  Dr. Adriana Simas.  My additional findings are documented and highlighted above in purple.  Aricka Goldberger de [employer], DO 01/31/2013 7:02 AM

## 2013-01-30 NOTE — Progress Notes (Signed)
Patient peak flow pre treatment was.....75....110.Marland Kitchen..250.

## 2013-01-30 NOTE — Progress Notes (Signed)
Post peak flow ......260...240.Marland KitchenMarland KitchenMarland Kitchen220

## 2013-01-30 NOTE — ED Notes (Signed)
Pt's cohabitants called EMS for pt having generalized weakness, abdominal pain, back pain and poor oral intake. Pt smells strongly of urine and has no complaints other than abdominal and back pain. Pt denies urinary sxs, n/v/d.

## 2013-01-30 NOTE — ED Provider Notes (Signed)
History     CSN: [id number]  Arrival date & time 01/30/13  2129   First MD Initiated Contact with Patient 01/30/13 2139      Chief Complaint  Patient presents with  . Weakness  . Abdominal Pain    (Consider location/radiation/quality/duration/timing/severity/associated sxs/prior treatment) HPI Comments: Pt with hx of DM, HTN comes in with cc of abd pain, weakness. Pt was recently admitted to [facility] hospital for septic shock. Pt states that she has been having some left sided chest pain, that is constant, dull, pressure like starting this evening. The pain is non radiating, not pleuritic, or exertional. Pt has no hx of CAD, and no provocative testing done so far. She has no hx of DVT, PE - and besides recent admission, no true risk factors for the same. Pt has no associated dib, nausea, diaphoresis. Pt also has abd pain - diffuse. She has no UTI like sx, but RN reports foul smelling urine.  I also called patient's home and spoke with the mother - who states that since the discharge -patient has not recovered as expected. She is concerned with patent's poor intake, and "lethargy type" symptoms. She also states that patient is unable to take care of her self, and she is doing everything for her.  Patient is a 57 y.o. female presenting with weakness and abdominal pain. The history is provided by the patient and medical records.  Weakness Associated symptoms include chest pain and abdominal pain. Pertinent negatives include no shortness of breath.  Abdominal Pain Associated symptoms: chest pain and fatigue   Associated symptoms: no chills, no constipation, no cough, no diarrhea, no fever, no hematuria, no nausea, no shortness of breath and no vomiting     Past Medical History  Diagnosis Date  . Arthritis   . Diabetes mellitus   . Hyperlipidemia   . Hypertension   . Allergy   . GERD (gastroesophageal reflux disease)   . Back pain     History reviewed. No pertinent past surgical  history.  Family History  Problem Relation Age of Onset  . Diabetes Mother   . COPD Mother   . Heart disease Mother   . Hyperlipidemia Mother   . Hypertension Mother   . Arthritis Mother     History  Substance Use Topics  . Smoking status: Current Every Day Smoker -- 1.00 packs/day    Types: Cigarettes  . Smokeless tobacco: Never Used  . Alcohol Use: No    OB History   Grav Para Term Preterm Abortions TAB SAB Ect Mult Living                  Review of Systems  Constitutional: Positive for fatigue. Negative for fever, chills and activity change.  HENT: Negative for facial swelling and neck pain.   Respiratory: Negative for cough, shortness of breath and wheezing.   Cardiovascular: Positive for chest pain.  Gastrointestinal: Positive for abdominal pain. Negative for nausea, vomiting, diarrhea, constipation, blood in stool and abdominal distention.  Genitourinary: Negative for hematuria and difficulty urinating.  Skin: Negative for color change.  Neurological: Positive for weakness. Negative for speech difficulty.  Hematological: Does not bruise/bleed easily.  Psychiatric/Behavioral: Negative for confusion.    Allergies  Codeine  Home Medications   Current Outpatient Rx  Name  Route  Sig  Dispense  Refill  . aspirin EC 81 MG tablet   Oral   Take 81 mg by mouth daily.         Marland Kitchen  benztropine (COGENTIN) 1 MG tablet   Oral   Take 1 mg by mouth at bedtime.         . cephALEXin (KEFLEX) 500 MG capsule   Oral   Take 1 capsule (500 mg total) by mouth every 8 (eight) hours.   16 capsule   0     Next dose - tonight.   . fluconazole (DIFLUCAN) 200 MG tablet   Oral   Take 1 tablet (200 mg total) by mouth daily.   11 tablet   0   . fluPHENAZine decanoate (PROLIXIN) 25 MG/ML injection   Intramuscular   Inject 25 mg into the muscle every 14 (fourteen) days. No instruction given         . gabapentin (NEURONTIN) 300 MG capsule   Oral   Take 1 capsule (300 mg  total) by mouth 3 (three) times daily.   270 capsule   3   . gemfibrozil (LOPID) 600 MG tablet   Oral   Take 600 mg by mouth 2 (two) times daily.         Marland Kitchen ibuprofen (ADVIL,MOTRIN) 600 MG tablet      take 1 tablet by mouth twice a day if needed for pain   60 tablet   1   . lisinopril-hydrochlorothiazide (PRINZIDE,ZESTORETIC) 20-25 MG per tablet   Oral   Take 1 tablet by mouth every morning.         . metFORMIN (GLUCOPHAGE) 500 MG tablet   Oral   Take 500-10,000 mg by mouth 2 (two) times daily with a meal. 2 tabs by mouth qam and 1 tab by mouth qhs         . niacin (NIASPAN) 750 MG CR tablet   Oral   Take 750 mg by mouth at bedtime.         Marland Kitchen omeprazole (PRILOSEC) 40 MG capsule   Oral   Take 40 mg by mouth every morning.         Marland Kitchen oxybutynin (DITROPAN) 5 MG tablet   Oral   Take 5 mg by mouth 2 (two) times daily.            BP 125/61  Pulse 97  Temp(Src) 98.8 F (37.1 C) (Oral)  Resp 15  SpO2 96%  Physical Exam  Nursing note and vitals reviewed. Constitutional: She is oriented to person, place, and time. She appears well-developed. She appears distressed.  HENT:  Head: Normocephalic and atraumatic.  Eyes: Conjunctivae and EOM are normal. Pupils are equal, round, and reactive to light. No scleral icterus.  Neck: Normal range of motion. Neck supple.  Cardiovascular: Normal rate, regular rhythm and normal heart sounds.   Pulmonary/Chest: Effort normal. No respiratory distress. She has wheezes. She has no rales. She exhibits tenderness.  Left sided chest pain is reproducible with palpation  Abdominal: Soft. Bowel sounds are normal. She exhibits no distension. There is tenderness. There is no rebound and no guarding.  Diffuse abd tenderness, with no peritoneal signs.  Neurological: She is alert and oriented to person, place, and time.  Skin: Skin is warm and dry.    ED Course  Procedures (including critical care time)  Labs Reviewed  CBC WITH  DIFFERENTIAL - Abnormal; Notable for the following:    RBC 3.83 (*)    Hemoglobin 11.8 (*)    HCT 33.7 (*)    All other components within normal limits  COMPREHENSIVE METABOLIC PANEL - Abnormal; Notable for the following:    Sodium 133 (*)  Chloride 94 (*)    Glucose, Bld 123 (*)    Creatinine, Ser 1.22 (*)    Alkaline Phosphatase 148 (*)    GFR calc non Af Amer 49 (*)    GFR calc Af Amer 56 (*)    All other components within normal limits  URINALYSIS, MICROSCOPIC ONLY - Abnormal; Notable for the following:    Leukocytes, UA LARGE (*)    All other components within normal limits  GLUCOSE, CAPILLARY - Abnormal; Notable for the following:    Glucose-Capillary 121 (*)    All other components within normal limits  LACTIC ACID, PLASMA  TROPONIN I   Dg Abd Acute W/chest  01/30/2013  *RADIOLOGY REPORT*  Clinical Data: Weakness and abdominal pain.  ACUTE ABDOMEN SERIES (ABDOMEN 2 VIEW & CHEST 1 VIEW)  Comparison: Chest 01/19/2013.  Abdominal series 01/02/2013.  Findings: Shallow inspiration.  Heart size and pulmonary vascularity are normal for technique.  Linear fibrosis or atelectasis in the lung bases, similar to previous study.  No focal consolidation or airspace disease.  No blunting of costophrenic angles.  No pneumothorax.  Mediastinal contours appear intact. Interval removal of central venous catheter since previous study. Degenerative changes in the spine and shoulders.  Normal bowel gas pattern with scattered gas and stool in the colon. No small or large bowel distension.  No free intra-abdominal air. No abnormal air fluid levels.  No radiopaque stones.  Calcified phleboliths in the pelvis.  Surgical clips in the right upper quadrant.  Degenerative changes in the spine and hips.  No significant change since previous study.  IMPRESSION: No evidence of active pulmonary disease.  Nonobstructive bowel gas pattern.   Original Report Authenticated By: Burman Nieves, M.D.      No diagnosis  found.    MDM   Date: 01/30/2013  Rate: 98  Rhythm: normal sinus rhythm  QRS Axis: normal  Intervals: normal  ST/T Wave abnormalities: normal  Conduction Disutrbances: none  Narrative Interpretation: unremarkable   DDx: Sepsis syndrome ACS syndrome DKA CHF exacerbation COPD exacerbation Infection - pneumonia/UTI/Cellulitis PE Dehydration Electrolyte abnormality Tox syndrome  Pt comes in with cc of chest pain, abd pain, and weakness. Pt is poor historian. Chest pain - she is diabetic. The pain is atypical and reproducvible, EKG is reassuring. Will get serial trops - good candidate for r.o ACS with provocative testing.  Pt did have some wheezing - AAS ordered -will make sure there is no infiltrate. 1 tx to be given. Abd pain - Generalized tenderness. No peritoneal signs. Given that she is a poor historian, with recent septic shock - we will get CT abdomen to ensure there is no intraperitoneal infection.  11:57 PM Pt is chest pain free. Lung exam improved. Labs show Cr is bumped for than 20% - thus she has AKI. With the abdexam not being overwhelming, we will get po contrast only. Pt also has WBCs in the urine. With her being diabetic, and having recent admission with likely foley catheter - will treat. I spoke with the Resident on call at [facility], and patient will be transferred to their service, telemetry - when patient has completed the CT. We will initiate screening labs for infection and reassess.   Derwood Kaplan, MD 01/30/13 [phone]

## 2013-01-31 ENCOUNTER — Emergency Department (HOSPITAL_COMMUNITY): Payer: Medicare Other

## 2013-01-31 ENCOUNTER — Inpatient Hospital Stay: Payer: Medicare Other | Admitting: Family Medicine

## 2013-01-31 ENCOUNTER — Encounter (HOSPITAL_COMMUNITY): Payer: Self-pay | Admitting: *Deleted

## 2013-01-31 DIAGNOSIS — R079 Chest pain, unspecified: Secondary | ICD-10-CM

## 2013-01-31 HISTORY — DX: Chest pain, unspecified: R07.9

## 2013-01-31 LAB — TSH: TSH: 0.969 u[IU]/mL (ref 0.350–4.500)

## 2013-01-31 LAB — CBC
HCT: 32.9 % — ABNORMAL LOW (ref 36.0–46.0)
Hemoglobin: 11.6 g/dL — ABNORMAL LOW (ref 12.0–15.0)
MCH: 30.1 pg (ref 26.0–34.0)
MCV: 85.5 fL (ref 78.0–100.0)
RBC: 3.85 MIL/uL — ABNORMAL LOW (ref 3.87–5.11)
WBC: 6 10*3/uL (ref 4.0–10.5)

## 2013-01-31 LAB — CREATININE, SERUM: GFR calc Af Amer: 76 mL/min — ABNORMAL LOW (ref >90)

## 2013-01-31 LAB — BASIC METABOLIC PANEL
BUN: 16 mg/dL (ref 6–23)
Chloride: 98 mEq/L (ref 96–112)
GFR calc Af Amer: 74 mL/min — ABNORMAL LOW (ref >90)
Glucose, Bld: 114 mg/dL — ABNORMAL HIGH (ref 70–99)
Potassium: 4.3 mEq/L (ref 3.5–5.1)
Sodium: 135 mEq/L (ref 135–145)

## 2013-01-31 LAB — HEMOGLOBIN A1C: Hgb A1c MFr Bld: 6.6 % — ABNORMAL HIGH (ref <5.7)

## 2013-01-31 LAB — GLUCOSE, CAPILLARY
Glucose-Capillary: 116 mg/dL — ABNORMAL HIGH (ref 70–99)
Glucose-Capillary: 107 mg/dL — ABNORMAL HIGH (ref 70–99)
Glucose-Capillary: 119 mg/dL — ABNORMAL HIGH (ref 70–99)

## 2013-01-31 LAB — TROPONIN I
Troponin I: <0.3 ng/mL (ref <0.30)
Troponin I: <0.3 ng/mL (ref <0.30)

## 2013-01-31 LAB — LIPID PANEL: Cholesterol: 169 mg/dL (ref 0–200)

## 2013-01-31 MED ORDER — MORPHINE SULFATE 2 MG/ML IJ SOLN
0.5000 mg | INTRAMUSCULAR | Status: DC | PRN
  Administered 2013-01-31 – 2013-02-03 (×6): 0.5 mg via INTRAVENOUS
  Filled 2013-01-31 (×6): qty 1

## 2013-01-31 MED ORDER — SODIUM CHLORIDE 0.9 % IV SOLN
INTRAVENOUS | Status: DC
  Administered 2013-01-31: 06:00:00 via INTRAVENOUS
  Administered 2013-02-01: 100 mL/h via INTRAVENOUS
  Administered 2013-02-02: 12:00:00 via INTRAVENOUS

## 2013-01-31 MED ORDER — DOCUSATE SODIUM 100 MG PO CAPS
100.0000 mg | ORAL_CAPSULE | Freq: Every day | ORAL | Status: DC
  Administered 2013-02-02 – 2013-02-03 (×2): 100 mg via ORAL
  Filled 2013-01-31 (×3): qty 1

## 2013-01-31 MED ORDER — HEPARIN SODIUM (PORCINE) 5000 UNIT/ML IJ SOLN
5000.0000 [IU] | Freq: Three times a day (TID) | INTRAMUSCULAR | Status: DC
  Administered 2013-01-31 – 2013-02-03 (×10): 5000 [IU] via SUBCUTANEOUS
  Filled 2013-01-31 (×13): qty 1

## 2013-01-31 MED ORDER — ONDANSETRON HCL 4 MG/2ML IJ SOLN: 4.0000 mg | Freq: Three times a day (TID) | INTRAMUSCULAR | Status: DC | PRN

## 2013-01-31 MED ORDER — ACETAMINOPHEN 325 MG PO TABS
650.0000 mg | ORAL_TABLET | ORAL | Status: DC | PRN
  Administered 2013-02-03: 650 mg via ORAL
  Filled 2013-01-31: qty 2

## 2013-01-31 MED ORDER — PANTOPRAZOLE SODIUM 40 MG PO TBEC
40.0000 mg | DELAYED_RELEASE_TABLET | Freq: Every day | ORAL | Status: DC
  Administered 2013-02-01 – 2013-02-03 (×3): 40 mg via ORAL
  Filled 2013-01-31 (×2): qty 1

## 2013-01-31 MED ORDER — ASPIRIN EC 81 MG PO TBEC
81.0000 mg | DELAYED_RELEASE_TABLET | Freq: Every day | ORAL | Status: DC
  Administered 2013-02-01 – 2013-02-03 (×3): 81 mg via ORAL
  Filled 2013-01-31 (×4): qty 1

## 2013-01-31 MED ORDER — MORPHINE SULFATE 2 MG/ML IJ SOLN
1.0000 mg | INTRAMUSCULAR | Status: DC | PRN
  Administered 2013-01-31: 1 mg via INTRAVENOUS
  Filled 2013-01-31: qty 1

## 2013-01-31 MED ORDER — NITROGLYCERIN 0.4 MG SL SUBL: 0.4000 mg | SUBLINGUAL_TABLET | SUBLINGUAL | Status: DC | PRN

## 2013-01-31 MED ORDER — GABAPENTIN 300 MG PO CAPS
300.0000 mg | ORAL_CAPSULE | Freq: Three times a day (TID) | ORAL | Status: DC
  Administered 2013-01-31 – 2013-02-03 (×8): 300 mg via ORAL
  Filled 2013-01-31 (×12): qty 1

## 2013-01-31 MED ORDER — DEXTROSE 5 % IV SOLN
1.0000 g | Freq: Two times a day (BID) | INTRAVENOUS | Status: DC
  Administered 2013-01-31: 1 g via INTRAVENOUS
  Filled 2013-01-31 (×2): qty 1

## 2013-01-31 MED ORDER — ONDANSETRON HCL 4 MG/2ML IJ SOLN
4.0000 mg | Freq: Four times a day (QID) | INTRAMUSCULAR | Status: DC | PRN
  Administered 2013-02-01: 4 mg via INTRAVENOUS
  Filled 2013-01-31: qty 2

## 2013-01-31 MED ORDER — SODIUM CHLORIDE 0.9 % IV SOLN: INTRAVENOUS | Status: DC

## 2013-01-31 MED ORDER — BENZTROPINE MESYLATE 1 MG PO TABS
1.0000 mg | ORAL_TABLET | Freq: Every day | ORAL | Status: DC
  Administered 2013-01-31 – 2013-02-02 (×3): 1 mg via ORAL
  Filled 2013-01-31 (×4): qty 1

## 2013-01-31 NOTE — Evaluation (Signed)
Physical Therapy Evaluation Patient Details Name: Krista [name] MRN: [id number] DOB: 1956-10-17 Today's Date: 01/31/2013 Time: 4098-1191 PT Time Calculation (min): 10 min  PT Assessment / Plan / Recommendation Clinical Impression  PMH of HTN, DM-2, GERD and schizophrenia who presents with complaints of chest and abdominal pain.  Patient recently discharged on 01/24/13 following admission for septic shock, AKI, and delirium.  Pt presents today with fair mobility, but unsafe without assistive devices.  She seems to be at high risk for falls and she is cognitively impaired.  I would recommend SNF for rehab as she needs increased supervision and support at this point in time she is not functioning well at home.  The pt, however, when I mentioned this adamately refused screaming, "No, no!".      PT Assessment  Patient needs continued PT services    Follow Up Recommendations  SNF    Does the patient have the potential to tolerate intense rehabilitation     NA  Barriers to Discharge Other (comment) not sure of the amount of support at discharge    Equipment Recommendations    none    Recommendations for Other Services   None  Frequency Min 3X/week    Precautions / Restrictions Precautions Precautions: Fall Precaution Comments: h/o pelvic fractures per CT   Pertinent Vitals/Pain Inconsistent reports of back pain.       Mobility  Bed Mobility Supine to Sit: 5: Supervision;With rails;HOB elevated Sit to Supine: 3: Mod assist Details for Bed Mobility Assistance: supervision to get to EOB using bed rail for support. Mod assist to help get bil legs back into bed.   Transfers Sit to Stand: With upper extremity assist;From bed;1: +2 Total assist Sit to Stand: Patient Percentage: 80% Stand to Sit: 1: +2 Total assist;With upper extremity assist;To bed Stand to Sit: Patient Percentage: 80% Details for Transfer Assistance: Pt two person assist for safety, jumped up to her feet and started  walking before therapists were ready.  Pt with anterior lean with COG anterior of BOS in standing and with walking.   Ambulation/Gait Ambulation/Gait Assistance: 1: +2 Total assist Ambulation/Gait: Patient Percentage: 80% Ambulation Distance (Feet): 120 Feet Assistive device: 2 person hand held assist Ambulation/Gait Assistance Details: pt walking way too fast to be safe, again with COG anterior of BOS making her a high risk for tripping and falling forward.  Pt almost yelling at halfway point of walk stating "back, back" maning she wanted to turn around.  I asked if it was due to her back pain and she said "yeah".   Gait Pattern: Step-through pattern;Trunk flexed General Gait Details: per previous admission notes she walked with RW.  May need to try this next visit and see if she looks safer with RW.  Chart (PT note) reports previous history of kyphosis, but in standing she looks more sway back with pelvis and abdomen forward (at least until she starts to progress forward with gait).          PT Diagnosis: Difficulty walking;Abnormality of gait;Altered mental status;Generalized weakness  PT Problem List: Decreased strength;Decreased activity tolerance;Decreased balance;Decreased mobility;Decreased cognition;Decreased safety awareness;Pain PT Treatment Interventions: DME instruction;Gait training;Stair training;Functional mobility training;Therapeutic activities;Therapeutic exercise;Balance training;Neuromuscular re-education;Cognitive remediation;Patient/family education   PT Goals Acute Rehab PT Goals PT Goal Formulation: Patient unable to participate in goal setting Time For Goal Achievement: 02/14/13 Potential to Achieve Goals: Fair Pt will go Supine/Side to Sit: with modified independence;with HOB 0 degrees PT Goal: Supine/Side to Sit - Progress: Goal  set today Pt will go Sit to Supine/Side: with modified independence;with HOB 0 degrees PT Goal: Sit to Supine/Side - Progress: Goal set  today Pt will go Sit to Stand: with supervision PT Goal: Sit to Stand - Progress: Goal set today Pt will go Stand to Sit: with supervision PT Goal: Stand to Sit - Progress: Goal set today Pt will Transfer Bed to Chair/Chair to Bed: with supervision PT Transfer Goal: Bed to Chair/Chair to Bed - Progress: Goal set today Pt will Ambulate: >150 feet;with supervision;with least restrictive assistive device PT Goal: Ambulate - Progress: Goal set today Pt will Go Up / Down Stairs: 3-5 stairs;with supervision;with rail(s) PT Goal: Up/Down Stairs - Progress: Goal set today  Visit Information  Last PT Received On: 01/31/13 Assistance Needed: +1    Subjective Data  Subjective: Pt not saying much, when she does say something is is usually "yeah".  She doesn't have any teeth and is mumbling.   Patient Stated Goal: she adamately got upset when I mentioned rehab centers   Prior Functioning  Home Living Additional Comments: difficult to assess due to cognitive status.  Pt is not even answering questions about pain consistantly.   Prior Function Comments: unable to assess Communication Communication: Other (comment) (has no teeth, mumbles incoheratnly)    Cognition  Cognition Overall Cognitive Status: History of cognitive impairments - at baseline Arousal/Alertness: Awake/alert Orientation Level: Disoriented X4 Behavior During Session: Restless Cognition - Other Comments: Pt is not giving consistant answers to questions.  When asking her about pain she reported she did not have pain, and then when asked again she reported she did have pain.      Extremity/Trunk Assessment Right Upper Extremity Assessment RUE Sensation: Deficits RUE Sensation Deficits: 4/5 with decreased ability to unclench hands bil Left Upper Extremity Assessment LUE ROM/Strength/Tone: Deficits LUE ROM/Strength/Tone Deficits: 4/5 with decreased ability to unclench hands bil Right Lower Extremity Assessment RLE  ROM/Strength/Tone: Within functional levels Left Lower Extremity Assessment LLE ROM/Strength/Tone: Within functional levels   Balance Static Sitting Balance Static Sitting - Balance Support: No upper extremity supported;Feet supported Static Sitting - Level of Assistance: 5: Stand by assistance Static Standing Balance Static Standing - Balance Support: Bilateral upper extremity supported Static Standing - Level of Assistance: 1: +2 Total assist;Patient percentage (comment) (80%)  End of Session PT - End of Session Activity Tolerance: Patient limited by pain Patient left: in bed;with bed alarm set Nurse Communication: Mobility status    Lurena Joiner B. Marelin Tat, PT, DPT [phone]   01/31/2013, 3:22 PM

## 2013-01-31 NOTE — Progress Notes (Signed)
Progress Note - Brief:  S: Patient seen and examined briefly at bedside (patient admitted earlier this am) after RN noted that patient was "acting differently." No reports of chest pain this am.  Also reports back pain, neck pain. Visibly, patient appears uncomfortable and appears diaphoretic.  O:  Physical exam: Filed Vitals:   01/31/13 0955  BP: 122/77  Pulse: 97  Temp: 97.3 F (36.3 C)  Resp: 16   General: hands contracted, appears diaphoretic and uncomfortable. Heart: RRR. Lungs: CTAB.  Neuro: No focal weakness.  AO x 3. Speech difficult to understand.  Possible facial droop, but difficult to discern given that patient is edentulous.   A/P: EKG now given diaphoresis and recent chest pain last night. Will consider head imaging given change in neurological status.

## 2013-01-31 NOTE — Progress Notes (Signed)
Utilization review completed.  

## 2013-01-31 NOTE — H&P (Signed)
FMTS Attending Admission Note: Krista Neyman,MD I  have seen and examined this patient, reviewed their chart. I have discussed this patient with the resident. I agree with the resident's findings, assessment and care plan.  Patient seen and evaluated by me,denies any chest pain or abdominal pain,she feels a little weak,no N/V.Physical exam benign,I reviewed her imaging study and lab report. Continue telemetry monitoring.Hydrate patient and monitor kidney function.

## 2013-01-31 NOTE — Clinical Social Work Note (Signed)
CSW introduced self and stated purpose of visit. CSW talked with patient regarding need for short-term rehab at a skilled facility before going home. Patient was not very communicative but responded no to going to a facility. She did give CSW permission to talk with her mother as CSW advised patient that she will need assistance at home. Call made to [facility] at [phone] and no answer. CSW will continue attempts to reach mother and will complete assessment with patient and mother.  Genelle Bal, MSW, LCSW [phone]

## 2013-01-31 NOTE — ED Notes (Signed)
Report given to [facility]  

## 2013-01-31 NOTE — Progress Notes (Signed)
Brief PCP Note:   Krista [name], she says her chest pain is resolved.  She is alert and oriented x3.  She tells me that she wants to go home.    - Unclear if patient received most recent prolixin injection.  Will attempt to get records tomorrow to see if she needs her Prolixin.  Her mental health issues interfere with her ability to care for her physical medical problems.  It sounds like her self care has declined lately, not sure if she is safe to go home as support at home is minimal besides her mother (who is elderly and has her own medical problems). She is resistant to SNF placement at this point but given concern for poor control of her bipolar disorder I am not sure if she has capacity to make that decision at this moment.  - Appreciate FMTS and Cardiology input.   Krista [name] 01/31/2013 9:38 PM

## 2013-01-31 NOTE — Progress Notes (Signed)
I examined this patient and discussed the care plan with Dr Adriana Simas and the [facility] team and agree with assessment and plan as documented in the progress note above. This afternoon she is moving her arms well and has now cogwheel rigidity or other EPS signs. Since there are no focal findings, brain imaging is not indicated.

## 2013-01-31 NOTE — Consult Note (Signed)
Admit date: 01/30/2013 Referring Physician  family practice teaching service Primary Physician  Ardyth Gal M.D. Primary Cardiologist  none Reason for Consultation  chest pain  ASSESSMENT: 1. Chest pain, poorly characterized, and not currently present. Abnormal EKG with possible ischemic anterior wall changes  2. Abdominal pain  3. Schizophrenia  4. Type 2 diabetes mellitus  5. Hypertension  6. Hyperlipidemia  7. GERD  PLAN:  1. Serial markers to rule out myocardial necrosis. Serial EKGs to exclude ischemia/evolution to acute infarction .  2. Pharmacologic nuclear study to exclude high-risk subset  3. Aspirin  4. Supplemental nitroglycerin if prolonged chest pain   HPI: Asked to evaluate  this 57 year old diabetic smoker with hypertension and hyperlipidemia who is admitted to the hospital with abdominal pain. After admission that is a complaint of left chest pain that is poorly characterized. No dyspnea, palpitations, prior history of chest pain, or cardiac disease.   PMH:   Past Medical History  Diagnosis Date  . Arthritis   . Diabetes mellitus   . Hyperlipidemia   . Hypertension   . Allergy   . GERD (gastroesophageal reflux disease)   . Back pain      PSH:  History reviewed. No pertinent past surgical history.  Allergies:  Codeine Prior to Admit Meds:   Prescriptions prior to admission  Medication Sig Dispense Refill  . aspirin EC 81 MG tablet Take 81 mg by mouth daily.      . benztropine (COGENTIN) 1 MG tablet Take 1 mg by mouth at bedtime.      . cephALEXin (KEFLEX) 500 MG capsule Take 1 capsule (500 mg total) by mouth every 8 (eight) hours.  16 capsule  0  . fluconazole (DIFLUCAN) 200 MG tablet Take 1 tablet (200 mg total) by mouth daily.  11 tablet  0  . fluPHENAZine decanoate (PROLIXIN) 25 MG/ML injection Inject 25 mg into the muscle every 14 (fourteen) days. No instruction given      . gabapentin (NEURONTIN) 300 MG capsule Take 1 capsule (300  mg total) by mouth 3 (three) times daily.  270 capsule  3  . gemfibrozil (LOPID) 600 MG tablet Take 600 mg by mouth 2 (two) times daily.      Marland Kitchen ibuprofen (ADVIL,MOTRIN) 600 MG tablet take 1 tablet by mouth twice a day if needed for pain  60 tablet  1  . lisinopril-hydrochlorothiazide (PRINZIDE,ZESTORETIC) 20-25 MG per tablet Take 1 tablet by mouth every morning.      . metFORMIN (GLUCOPHAGE) 500 MG tablet Take 500-10,000 mg by mouth 2 (two) times daily with a meal. 2 tabs by mouth qam and 1 tab by mouth qhs      . niacin (NIASPAN) 750 MG CR tablet Take 750 mg by mouth at bedtime.      Marland Kitchen omeprazole (PRILOSEC) 40 MG capsule Take 40 mg by mouth every morning.      Marland Kitchen oxybutynin (DITROPAN) 5 MG tablet Take 5 mg by mouth 2 (two) times daily.        Fam HX:    Family History  Problem Relation Age of Onset  . Diabetes Mother   . COPD Mother   . Heart disease Mother   . Hyperlipidemia Mother   . Hypertension Mother   . Arthritis Mother    Social HX:    History   Social History  . Marital Status: Legally Separated    Spouse Name: N/A    Number of Children: N/A  . Years of Education:  N/A   Occupational History  . Not on file.   Social History Main Topics  . Smoking status: Current Every Day Smoker -- 1.00 packs/day    Types: Cigarettes  . Smokeless tobacco: Never Used  . Alcohol Use: No  . Drug Use: Not on file  . Sexually Active: Not on file   Other Topics Concern  . Not on file   Social History Narrative  . No narrative on file     Review of Systems: Unable to obtain useful history  Physical Exam: Blood pressure 136/88, pulse 102, temperature 98.2 F (36.8 C), temperature source Oral, resp. rate 18, height 5\' 5"  (1.651 m), weight 71.1 kg (156 lb 12 oz), SpO2 98.00%. Weight change:    Patient is lying flat in her bed. There is no distress.  Skin is clear without evidence of cyanosis or jaundice.  HEENT exam reveals pupils are equal and reactive.  Neck exam reveals no  JVD or carotid bruits.  Chest clear to auscultation and percussion.  The cardiac exam is unremarkable. No rub, murmur, or gallop is heard.  Abdomen is nontender. Bowel sounds are normal and there are no bruits.  Extremities reveal no edema. The cells pedis pulses are 2+ bilaterally. Radial pulses are 2+ bilaterally.  Neurological is difficult to assess. The patient does not freely engaging conversation. There is also potentially an aphasia. Labs:   Lab Results  Component Value Date   WBC 6.0 01/31/2013   HGB 11.6* 01/31/2013   HCT 32.9* 01/31/2013   MCV 85.5 01/31/2013   PLT 344 01/31/2013    Recent Labs Lab 01/30/13 2211 01/31/13 0703  NA 133* 135  K 4.2 4.3  CL 94* 98  CO2 25 22  BUN 21 16  CREATININE 1.22* 0.95  0.97  CALCIUM 10.0 9.6  PROT 7.2  --   BILITOT 0.4  --   ALKPHOS 148*  --   ALT 15  --   AST 18  --   GLUCOSE 123* 114*   No results found for this basename: PTT   Lab Results  Component Value Date   INR 1.14 01/20/2013   Lab Results  Component Value Date   TROPONINI <0.30 01/31/2013     Lab Results  Component Value Date   CHOL 169 01/31/2013   CHOL 201* 08/21/2011   CHOL 206* 09/14/2007   Lab Results  Component Value Date   HDL 56 01/31/2013   HDL 45 04/54/0981   HDL 44 09/14/2007   Lab Results  Component Value Date   LDLCALC 86 01/31/2013   LDLCALC 117* 08/21/2011   LDLCALC 123 08/17/2008   Lab Results  Component Value Date   TRIG 133 01/31/2013   TRIG 197* 08/21/2011   TRIG 258* 09/14/2007   Lab Results  Component Value Date   CHOLHDL 3.0 01/31/2013   CHOLHDL 4.5 08/21/2011   CHOLHDL 4.7 Ratio 09/14/2007   Lab Results  Component Value Date   LDLDIRECT 90 10/07/2012   LDLDIRECT 103* 12/20/2009   LDLDIRECT 123* 08/16/2008      Radiology:  Ct Abdomen Pelvis Wo Contrast  01/31/2013  *RADIOLOGY REPORT*  Clinical Data: Diffuse abdominal pain.  Recent admission for septic shock.  CT ABDOMEN AND PELVIS WITHOUT CONTRAST  Technique:   Multidetector CT imaging of the abdomen and pelvis was performed following the standard protocol without intravenous contrast.  Comparison: None.  Findings: The lung bases are clear.  Tortuous abdominal aorta. Surgical absence of the gallbladder.  The unenhanced  appearance of the liver, spleen, pancreas, adrenal glands, kidneys, inferior vena cava, and retroperitoneal lymph nodes is unremarkable. Calcification of the abdominal aorta without aneurysm.  The stomach and small bowel are decompressed.  Contrast material flows to the colon without evidence of obstruction.  No colonic wall thickening or distension.  No free air or free fluid in the abdomen.  Pelvis:  The uterus and ovaries are not enlarged.  The bladder wall is not thickened.  No free or loculated pelvic fluid collections. The appendix is not identified.  No evidence of diverticulitis.  No significant pelvic lymphadenopathy.  Degenerative changes throughout the lumbar spine.  Severe compression fractures of T11, L1, L2, and L5 vertebrae with sclerosis.  Sclerosis in the bowel vertebra may be reactive although pathologic fractures due to sclerotic metastasis or renal osteodystrophy is not excluded.  The time course of the fractures is indeterminate but the presence of degenerative change suggest that they are chronic.  No bone destruction is appreciated.  IMPRESSION: No acute inflammatory process demonstrated in the abdomen or pelvis.  Multiple vertebral compression fractures with sclerosis and degenerative change.  Consider pathologic fractures in the setting of renal osteodystrophy or metastasis.   Original Report Authenticated By: Burman Nieves, M.D.    Dg Abd Acute W/chest  01/30/2013  *RADIOLOGY REPORT*  Clinical Data: Weakness and abdominal pain.  ACUTE ABDOMEN SERIES (ABDOMEN 2 VIEW & CHEST 1 VIEW)  Comparison: Chest 01/19/2013.  Abdominal series 01/02/2013.  Findings: Shallow inspiration.  Heart size and pulmonary vascularity are normal for  technique.  Linear fibrosis or atelectasis in the lung bases, similar to previous study.  No focal consolidation or airspace disease.  No blunting of costophrenic angles.  No pneumothorax.  Mediastinal contours appear intact. Interval removal of central venous catheter since previous study. Degenerative changes in the spine and shoulders.  Normal bowel gas pattern with scattered gas and stool in the colon. No small or large bowel distension.  No free intra-abdominal air. No abnormal air fluid levels.  No radiopaque stones.  Calcified phleboliths in the pelvis.  Surgical clips in the right upper quadrant.  Degenerative changes in the spine and hips.  No significant change since previous study.  IMPRESSION: No evidence of active pulmonary disease.  Nonobstructive bowel gas pattern.   Original Report Authenticated By: Burman Nieves, M.D.    EKG:  Sinus tachycardia, prominent voltage, biphasic T waves V1 through V4 with mild ST elevation.    Lesleigh Noe 01/31/2013 4:48 PM

## 2013-01-31 NOTE — Progress Notes (Addendum)
Internal medicine MD. Was page,pt. Is acting different,she shows some rigidity on both hands and face,unable to stick her tongue out and grab her cup like she was doing couple hour ago,also complaining of pain on the rt. Side of her neck.keep monitoring pt. Closely and assessing her needs.

## 2013-01-31 NOTE — Evaluation (Signed)
Occupational Therapy Evaluation and Discharge Patient Details Name: Krista [name] MRN: [id number] DOB: Aug 10, 1956 Today's Date: 01/31/2013 Time: 9147-8295 OT Time Calculation (min): 11 min  OT Assessment / Plan / Recommendation Clinical Impression  This 57 yo female admitted with Chest pain, Abdominal pain and found to have old low back compression fractures presents to acute OT at a S level and will have this at home. Would recommend HHOT just to see if there could be anything made easier or safer at home. Acute OT will sign off.    OT Assessment  Patient needs continued OT Services    Follow Up Recommendations  Home health OT    Barriers to Discharge None    Equipment Recommendations  None recommended by OT          Precautions / Restrictions Precautions Precautions: Fall Precaution Comments: Multiple vertebral compression fractures with sclerosis and degenerative changes Restrictions Weight Bearing Restrictions: No   Pertinent Vitals/Pain Back pain with doffing and donning left sock    ADL  Equipment Used:  (None) Transfers/Ambulation Related to ADLs: S for all in her room ADL Comments: Pt is S for all BADLs and per her and also calling her mother someone is with her 24/7. Mother states she can help daughter put on and take off socks if needed since this seems to aggravate pts back pain    OT Diagnosis: Cognitive deficits;Acute pain  OT Problem List: Decreased cognition;Decreased coordination;Pain   OT Goals Acute Rehab OT Goals OT Goal Formulation: With patient  Visit Information  Last OT Received On: 01/31/13 Assistance Needed: +1       Prior Functioning     Home Living Lives With: Family Available Help at Discharge: Family;Available 24 hours/day Type of Home: House Bathroom Toilet: Standard Additional Comments: difficult to assess due to cognitive status.  Pt is not even answering questions about pain consistantly.   Prior Function Level of  Independence: Independent Able to Take Stairs?: Yes Driving: No Vocation: On disability Comments: unable to assess Communication Communication:  (dysarthric) Dominant Hand: Right         Vision/Perception Vision - History Baseline Vision: No visual deficits   Cognition  Cognition Overall Cognitive Status: History of cognitive impairments - at baseline Arousal/Alertness: Awake/alert Orientation Level: Disoriented X4 Behavior During Session: WFL for tasks performed Cognition - Other Comments: Pt is not giving consistant answers to questions.  When asking her about pain she reported she did not have pain, and then when asked again she reported she did have pain.      Extremity/Trunk Assessment Right Upper Extremity Assessment RUE ROM/Strength/Tone: Within functional levels RUE Sensation: Deficits RUE Sensation Deficits: 4/5 with decreased ability to unclench hands bil RUE Coordination: Deficits RUE Coordination Deficits: FM Left Upper Extremity Assessment LUE ROM/Strength/Tone: Within functional levels LUE ROM/Strength/Tone Deficits: 4/5 with decreased ability to unclench hands bil LUE Coordination: Deficits LUE Coordination Deficits: FM      Mobility Bed Mobility Bed Mobility: Supine to Sit;Sit to Supine Supine to Sit: 5: Supervision;HOB flat;With rails Sit to Supine: 5: Supervision;HOB flat;With rail Transfers Transfers: Sit to Stand;Stand to Sit Sit to Stand: 5: Supervision;With upper extremity assist;From bed Stand to Sit: 5: Supervision;With upper extremity assist;To bed            End of Session OT - End of Session Equipment Utilized During Treatment:  (none) Activity Tolerance: Patient limited by pain Patient left: in bed;with call bell/phone within reach;with bed alarm set Nurse Communication:  (Pt/mother report  pt does drool out of left side of mouth)       Evette Georges [phone] 01/31/2013, 5:31 PM

## 2013-01-31 NOTE — Progress Notes (Signed)
Patient admitted to room 6704. Alert and oriented x 3. Skin warm and  Dry. Respiration even and unlabored. Redness to buttocks blanches. Kept dry and comfortable. Needs attended.

## 2013-02-01 DIAGNOSIS — E1139 Type 2 diabetes mellitus with other diabetic ophthalmic complication: Secondary | ICD-10-CM

## 2013-02-01 LAB — CBC
HCT: 34 % — ABNORMAL LOW (ref 36.0–46.0)
Hemoglobin: 11.9 g/dL — ABNORMAL LOW (ref 12.0–15.0)
MCHC: 35 g/dL (ref 30.0–36.0)
RBC: 3.92 MIL/uL (ref 3.87–5.11)
WBC: 6.3 10*3/uL (ref 4.0–10.5)

## 2013-02-01 LAB — URINE CULTURE
Colony Count: NO GROWTH
Culture: NO GROWTH

## 2013-02-01 LAB — BASIC METABOLIC PANEL
BUN: 12 mg/dL (ref 6–23)
CO2: 23 mEq/L (ref 19–32)
Chloride: 98 mEq/L (ref 96–112)
Glucose, Bld: 97 mg/dL (ref 70–99)
Potassium: 4.1 mEq/L (ref 3.5–5.1)
Sodium: 135 mEq/L (ref 135–145)

## 2013-02-01 LAB — GLUCOSE, CAPILLARY
Glucose-Capillary: 100 mg/dL — ABNORMAL HIGH (ref 70–99)
Glucose-Capillary: 87 mg/dL (ref 70–99)
Glucose-Capillary: 109 mg/dL — ABNORMAL HIGH (ref 70–99)

## 2013-02-01 NOTE — Progress Notes (Signed)
Interval note, communication with family  I called and asked about pt's recent state at home. Spoke with her brother, Ula Lingo Ward who states that for the last few weeks she has been lying around, not taking care of herself, and not really taking anything by mouth.   Kevin Fenton, MD 02/01/2013, 403 pm

## 2013-02-01 NOTE — Progress Notes (Signed)
I examined this patient and discussed the care plan with Dr Ermalinda Memos and the [facility] team and agree with assessment and plan as documented in the progress note above. She denied any chest or abdominal pain today.

## 2013-02-01 NOTE — Progress Notes (Signed)
Family Medicine Teaching Service Daily Progress Note Service Page: [phone]  Subjective:  Pt hungry this am and denying chest and abd pain.   Objective: Temp:  [97.3 F (36.3 C)-98.3 F (36.8 C)] 98.3 F (36.8 C) (04/01 0457) Pulse Rate:  [93-102] 93 (04/01 0457) Resp:  [16-18] 18 (04/01 0457) BP: (119-146)/(68-96) 119/68 mmHg (04/01 0457) SpO2:  [97 %-100 %] 99 % (04/01 0457) Weight:  [152 lb 1.9 oz (69 kg)] 152 lb 1.9 oz (69 kg) (03/31 2055) Exam: Gen: NAD, alert, cooperative with exam HEENT: NCAT, MMM CV: RRR, good S1/S2, no murmur Resp: mild crackles at bases, non-labored Abd: SNTND, BS present, no guarding or organomegaly Ext: No edema, warm and well perfused Neuro: Alert and oriented X3, Strength 5/5 in all 4 extrmities, unable to explain why she is here   I have reviewed the patient's medications, labs, imaging, and diagnostic testing.  Notable results are summarized below.  CBC BMET   Recent Labs Lab 01/30/13 2211 01/31/13 0703  WBC 6.9 6.0  HGB 11.8* 11.6*  HCT 33.7* 32.9*  PLT 395 344    Recent Labs Lab 01/30/13 2211 01/31/13 0703  NA 133* 135  K 4.2 4.3  CL 94* 98  CO2 25 22  BUN 21 16  CREATININE 1.22* 0.95  0.97  GLUCOSE 123* 114*  CALCIUM 10.0 9.6       Recent Labs Lab 01/30/13 2211 01/31/13 0703 01/31/13 0915  TROPONINI <0.30 <0.30 <0.30   Scheduled Meds: . aspirin EC  81 mg Oral Daily  . benztropine  1 mg Oral QHS  . docusate sodium  100 mg Oral Daily  . gabapentin  300 mg Oral TID  . heparin  5,000 Units Subcutaneous Q8H  . pantoprazole  40 mg Oral Daily   Continuous Infusions: . sodium chloride 100 mL/hr at 01/31/13 0618   PRN Meds:.acetaminophen, morphine injection, nitroGLYCERIN, nitroGLYCERIN, ondansetron (ZOFRAN) IV   Imaging/Diagnostic Tests: DG abdomen acute with chest 01/30/2013 IMPRESSION:  No evidence of active pulmonary disease. Nonobstructive bowel gas  pattern.  CT abd pelvis w/o 01/30/2013 IMPRESSION:   No acute inflammatory process demonstrated in the abdomen or  pelvis. Multiple vertebral compression fractures with sclerosis  and degenerative change. Consider pathologic fractures in the  setting of renal osteodystrophy or metastasis.    Plan: Krista [name] is a 57 y.o. year old female PMH of HTN, DM-2, GERD and schizophrenia who presents with complaints of chest and abdominal pain. Patient recently discharged on 01/24/13 following admission for septic shock, AKI, and delirium.   Chest pain - EKG revealed NSR with no ST or T wave changes. Pain now resolved.  - troponin negative X3 - SL Nitro PRN, Morphine PRN for severe pain. Will continue daily Aspirin.  - Risk stratification labs - Lipid panel, TSH, A1--> WNL, LDL 86, HDL 56  - Cardiology consulted and recommends nuclear stress, patient apparently refusing.   Abdominal pain - Patient has had recent admission for septic shock secondary to urosepsis with no current UTI symptoms, now denies pain.  - DDX - Pyelonephritis (less likely given no fever, leukocytosis, or findings on CT), Constipation, Viral gastroenteritis (denies diarrhea, nausea/vomiting), Inflammatory bowel disease, Colitis (no findings on CT scan). Unclear etiology at this time.  - Acute abdominal series negative.  - CT abdomen/pelvis was negative for intrabdominal/pelvic inflammatory or infectious processes.  - Patient was given 1 dose of Cefepime in the ED which would cover for pyelonephritis.  - Will continue to monitor closely and treat  pain aggressively. Colace daily for constipation.   AKI  - Creatinine elevated at 1.22 on admission (0.81 on 3/24) decreased to 0.95 on 3/31 - Likely secondary to poor PO intake (reported by ED provider after talking with patient's mother)  - IV fluids: NS @ 100 mL/hr   Compression fractures  - Seen on CT abdomen/pelvis. Patient denies recent fall.  - Fractures appear chronic in nature.  - Will consider further work up during  admission  - Consider Calcitonin to treat vertebral compression Fx, however these are not acute   DM-2  - A1C- 6.6 - CBG's TID AC and QHS - 100-119 - Holding SSI at this time at CBG's are well controlled and last A1C (07/2012) was 6.6.  - Will continue home Gabapentin   HTN  - BP stable currently, high of 146/96 and 131/91, everything else WNL - Will hold home Lisinopril/HCTZ in the setting of AKI   Hyperlipidemia  - LDL 86 and HDL 56, no intervention needed at this time  GERD  - Daily Protonix   Schizophrenia  - Will continue home Cogentin, will contact [facility] for current antipsychotic medication doses  FEN/GI: Carb modified diet; NS @ 100 mL/hr  Prophylaxis: Heparin SQ  Disposition: SNF vs home with HH, challenging as pt doesn't want SNF but doesn't seem to be able to care for herself, will discuss with team Code Status: Full Code   Kevin Fenton, MD 02/01/2013, 7:02 AM

## 2013-02-01 NOTE — Clinical Social Work Psychosocial (Signed)
Clinical Social Work Department BRIEF PSYCHOSOCIAL ASSESSMENT 02/01/2013  Patient:  Krista [name], Krista [name]     Account Number:  [id number]     Admit date:  01/30/2013  Clinical Social Worker:  Delmer Islam  Date/Time:  02/01/2013 06:16 AM  Referred by:  Physician  Date Referred:  01/31/2013 Referred for  SNF Placement   Other Referral:   Interview type:  Patient Other interview type:   CSW also talked with patient's mother Krista [name]    PSYCHOSOCIAL DATA Living Status:  FAMILY Admitted from facility:   Level of care:   Primary support name:  Krista [name] Primary support relationship to patient:  PARENT Degree of support available:   Krista [name] appears very concerned and supportive. Ph [phone]. CSW also talked with patient's sister who is emotionally supportive, but unable to assist patient in some pratical matters as she has a husband and family of her own.    CURRENT CONCERNS Current Concerns  Post-Acute Placement   Other Concerns:    SOCIAL WORK ASSESSMENT / PLAN CSW talked with patient at length about discharge plans and PT's recommendation of ST rehab. Discussed with patient the concern that her mother would be physically able to help her especially if she fell or need assistance that takes some strength.  Patient contacted her mother and CSW talked with her regarding the support and assistance patient would need at home.    During our conversation patient expressed many concerns that included losing her check and this was discussed. CSW also advised patient that follow-up would be done to assure her check would not be affected. Patient was then concerned about being able to pay the [facility] bills, reporting that her mother and brother would not be able to do it. Patient contacted her sister and CSW talked with her about patient's needs, however she would not be able to help patient pay her bills although patient just needs someone to get the money orders and pay the bills, using  her funds.    At one point patient was in agreement and called her friend Krista [name] regarding a facility and her friend recommended [facility]. CSW also talked with Krista [name] at patient's request regarding patient's need for ST rehab. However at the end of our conversation Krista [name] stated that could not go to a facility and was going home. CSW expressed to patient her right to make the decision.   Assessment/plan status:  Psychosocial Support/Ongoing Assessment of Needs Other assessment/ plan:   Information/referral to community resources:    PATIENT'S/FAMILY'S RESPONSE TO PLAN OF CARE: Patient was very open and engaged with CSW during our conversation about discharge planning. CSW listened empathetically and offered patient support. Krista [name] was assured that although going home was not what was being recommended, ultimately it was her decision. CSW will continue to follow and is available if other services needed.

## 2013-02-01 NOTE — Progress Notes (Signed)
She refuses nuclear stress. I recommend no further cardiac workup.

## 2013-02-01 NOTE — Progress Notes (Signed)
Physical Therapy Treatment Patient Details Name: Krista [name] MRN: [id number] DOB: [birth year] Today's Date: 02/01/2013 Time: 4098-1191 PT Time Calculation (min): 27 min  PT Assessment / Plan / Recommendation Comments on Treatment Session  Pt with better cooperation with PT and walking today.  RW assisted balance, but pt did have one major loss of balance at end of walk and she c/o back pain.  She continues to need follow up PT; agree that SNF would be the safest setting.    Follow Up Recommendations  SNF     Does the patient have the potential to tolerate intense rehabilitation     Barriers to Discharge        Equipment Recommendations  Rolling walker with 5" wheels    Recommendations for Other Services    Frequency Min 3X/week   Plan Discharge plan remains appropriate;Frequency remains appropriate    Precautions / Restrictions Precautions Precautions: Fall Precaution Comments: Multiple vertebral compression fractures with sclerosis and degenerative changes Restrictions Weight Bearing Restrictions: No   Pertinent Vitals/Pain Pt c/o back pain at end of walk; major loss of balance at end of walk    Mobility  Bed Mobility Bed Mobility: Supine to Sit;Sit to Supine Rolling Right: 7: Independent Rolling Left: 7: Independent Supine to Sit: 5: Supervision;HOB flat;With rails Sit to Supine: 5: Supervision;HOB flat;With rail Scooting to [facility]: 6: Modified independent (Device/Increase time);With rail Details for Bed Mobility Assistance: pt is able to move in the bed without physical assistance Transfers Transfers: Sit to Stand;Stand to Sit Sit to Stand: 5: Supervision;With upper extremity assist;From bed Stand to Sit: 5: Supervision;With upper extremity assist;To bed Details for Transfer Assistance: pt needs cues to stop and stand still while therapist prepares to help her( get IV, make sure gown and gait belt are secure) Ambulation/Gait Ambulation/Gait Assistance: 4: Min  assist;3: Mod assist Ambulation Distance (Feet): 200 Feet Assistive device: Rolling walker Ambulation/Gait Assistance Details: pt with periods of impulsiveness and needed assist to guide walker.  Pt had one episode of major balance loss when she was faitigued that required mod assist to maintain her on her feet even with RW Gait Pattern: Step-through pattern;Trunk flexed Gait velocity: varies, at times too fast and unsafe General Gait Details: pt used RW for support today. large anterior abdomen noted today Stairs: No Wheelchair Mobility Wheelchair Mobility: No    Exercises General Exercises - Lower Extremity Ankle Circles/Pumps: AROM;Both;10 reps Hip ABduction/ADduction: AROM;Both;10 reps;Sidelying Straight Leg Raises: AROM;Both;10 reps;Supine Hip Flexion/Marching: AROM;10 reps;Both;Supine   PT Diagnosis:    PT Problem List:   PT Treatment Interventions:     PT Goals Acute Rehab PT Goals PT Goal Formulation: Patient unable to participate in goal setting Time For Goal Achievement: 02/14/13 Potential to Achieve Goals: Fair Pt will go Supine/Side to Sit: with modified independence;with HOB 0 degrees PT Goal: Supine/Side to Sit - Progress: Progressing toward goal Pt will go Sit to Supine/Side: with modified independence;with HOB 0 degrees Pt will go Sit to Stand: with supervision PT Goal: Sit to Stand - Progress: Progressing toward goal Pt will go Stand to Sit: with supervision PT Goal: Stand to Sit - Progress: Progressing toward goal Pt will Transfer Bed to Chair/Chair to Bed: with supervision Pt will Ambulate: >150 feet;with supervision;with least restrictive assistive device PT Goal: Ambulate - Progress: Progressing toward goal Pt will Go Up / Down Stairs: 3-5 stairs;with supervision;with rail(s)  Visit Information  Last PT Received On: 02/01/13    Subjective Data  Subjective: Pt c/o back  pain after walking Patient Stated Goal: I want something to drink   Cognition   Cognition Overall Cognitive Status: History of cognitive impairments - at baseline Arousal/Alertness: Awake/alert Behavior During Session: [facility] for tasks performed Cognition - Other Comments: pt appears to become impulsive/anxious with back pain    Balance  Balance Balance Assessed: Yes Static Sitting Balance Static Sitting - Balance Support: Feet unsupported;No upper extremity supported Static Sitting - Level of Assistance: 7: Independent Static Standing Balance Static Standing - Balance Support: Bilateral upper extremity supported Static Standing - Level of Assistance: 5: Stand by assistance Static Standing - Comment/# of Minutes: used TW and pt able to stand  End of Session PT - End of Session Equipment Utilized During Treatment: Gait belt Activity Tolerance: Patient limited by pain Patient left: in bed;with bed alarm set Nurse Communication: Mobility status   GP    Bayard Hugger. Manson Passey, [place] [phone] 02/01/2013, 10:58 AM

## 2013-02-02 DIAGNOSIS — R079 Chest pain, unspecified: Secondary | ICD-10-CM

## 2013-02-02 DIAGNOSIS — R404 Transient alteration of awareness: Secondary | ICD-10-CM

## 2013-02-02 DIAGNOSIS — F205 Residual schizophrenia: Secondary | ICD-10-CM

## 2013-02-02 DIAGNOSIS — E871 Hypo-osmolality and hyponatremia: Secondary | ICD-10-CM

## 2013-02-02 DIAGNOSIS — R109 Unspecified abdominal pain: Secondary | ICD-10-CM

## 2013-02-02 DIAGNOSIS — I10 Essential (primary) hypertension: Secondary | ICD-10-CM

## 2013-02-02 LAB — BASIC METABOLIC PANEL
CO2: 25 mEq/L (ref 19–32)
Chloride: 98 mEq/L (ref 96–112)
Glucose, Bld: 132 mg/dL — ABNORMAL HIGH (ref 70–99)
Potassium: 3.8 mEq/L (ref 3.5–5.1)
Sodium: 133 mEq/L — ABNORMAL LOW (ref 135–145)

## 2013-02-02 LAB — GLUCOSE, CAPILLARY
Glucose-Capillary: 107 mg/dL — ABNORMAL HIGH (ref 70–99)
Glucose-Capillary: 101 mg/dL — ABNORMAL HIGH (ref 70–99)

## 2013-02-02 NOTE — Progress Notes (Signed)
I examined this patient and discussed the care plan with Dr Claiborne Billings and the [facility] team and agree with assessment and plan as documented in the progress note above. She says her family will be glad to have her come home, but her brother had been concerned about her vegetative symptoms so we will discuss her return with the family while awaiting psychiatric recommendations.

## 2013-02-02 NOTE — Discharge Summary (Signed)
Physician Discharge Summary  Patient ID: Krista [name] MRN: [id number] DOB: Jan 08, 1956 Age: 57 y.o.  Admit date: 01/30/2013 Discharge date: 02/03/2013 PCP: Ardyth Gal, MD  Consultants:Psychiatry   Discharge Diagnosis: Principal Problem:   Chest pain Active Problems:   HYPERLIPIDEMIA   HYPERTENSION, BENIGN SYSTEMIC   Diabetic peripheral neuropathy associated with type 2 diabetes mellitus   Acute kidney injury   Abdominal pain    Hospital Course Krista [name] is a 57 y.o. year old female PMH of HTN, DM-2, GERD and schizophrenia who presents with complaints of chest and abdominal pain. Patient recently discharged on 01/24/13 following admission for septic shock, AKI, and delirium. Patient was admitted to telemetry bed and ruled out for ACS.  Chest pain - EKG revealed NSR with no ST or T wave changes. Troponins were negative x3. Risk stratification labs were obtained and results listed below. ASA was continued during admission with SL Nitro and morphine available PRN, nitro was never needed and she received a total of 4 mg morphine during her entire admission . Cardiology was consulted  and recommenced nuclear stress and patient refused. Pain resolved prior to discharge.    Abdominal pain - Patient has had recent admission for septic shock secondary to urosepsis with no current UTI symptoms. Urine culture from 3/30 shows no growth, making UTI unlikely. CT and abdominal series negative for acute process. Unclear etiology.   AKI: Creatinine elevated on admission (1.22), likely from decreased oral intake, resolved prior to discharge with IVF.  Hyponatremia: Patient appeared euvolemic. Differential included SIADH vs primary polydipsia at this point. Reviewed psych medications and did not see that prolyxin or cogentin cause SIADH. Primary polydipsia could be an explanation.   Compression fractures: Seen on CT abdomen/pelvis. Patient denies recent fall. Fractures appear chronic in  nature.   DM-2: A1C- 6.6 (07/2012), CBG's TID AC and QHS -87-107. SSI was held d/t normal range CBG. Gabapentin was continued for neuropathy pain.   HTN: BP remained stable off of lisinopril/HCTZ   Hyperlipidemia: LDL 86 and HDL 56, no intervention needed at this time   GERD: Daily Protonix   Schizophrenia: continued home Cogentin. Patient on depo antipsychotic: prolyxin. Psych was consulted and t reported patient having capacity and need for follow up as outpatient. She had a [facility] appointment week of discharge.  Procedures/Imaging:  Ct Abdomen Pelvis Wo Contrast 01/19/2013 CT head:  Normal intracranially. 2.  Probable calcified sebaceous cyst in the right frontal scalp near the vertex.   01/19/2013  CXR Suboptimal inspiration accounts for bibasilar atelectasis.  No acute cardiopulmonary disease otherwise.  Stable cardiomegaly without pulmonary edema.    01/20/2013  Renal US: Slight increased echogenicity of both kidneys suggesting medical renal disease.  No hydronephrosis.    01/20/2013 CXR:: Right central venous catheter tip overlies the SVC.  No pneumothorax.  Otherwise stable appearance of the chest.    01/23/2013 CT Head: No evidence of acute intracranial abnormality. 01/30/2013  abd xray w/CXR: No evidence of active pulmonary disease.  Nonobstructive bowel gas pattern.    01/31/2013  CT ABD/Pelvis:  No acute inflammatory process demonstrated in the abdomen or pelvis.  Multiple vertebral compression fractures with sclerosis and degenerative change.  Consider pathologic fractures in the setting of renal osteodystrophy or metastasis.   Labs  CBC  Recent Labs Lab 01/30/13 2211 01/31/13 0703 02/01/13 0938  WBC 6.9 6.0 6.3  HGB 11.8* 11.6* 11.9*  HCT 33.7* 32.9* 34.0*  PLT 395 344 372   BMET  Recent Labs Lab  01/30/13 2211  02/01/13 0938 02/02/13 0505 02/03/13 0635  NA 133*  < > 135 133* 128*  K 4.2  < > 4.1 3.8 3.4*  CL 94*  < > 98 98 97  CO2 25  < > 23 25 22   BUN 21  < > 12  13 10   CREATININE 1.22*  < > 0.84 0.88 0.82  CALCIUM 10.0  < > 9.6 9.3 8.7  PROT 7.2  --   --   --   --   BILITOT 0.4  --   --   --   --   ALKPHOS 148*  --   --   --   --   ALT 15  --   --   --   --   AST 18  --   --   --   --   GLUCOSE 123*  < > 97 132* 148*  < > = values in this interval not displayed.  Risk stratification labs:  TSH: WNL Lipids: Total: 169, Tg 133, HDL 56, LDL 86 A1c: again 6.6  Patient condition at time of discharge/disposition: stable  Disposition-home   Follow up issues: 1. PT recommending SNF. Patient refused. Home health was ordered on discharge. 2. Cardiology recommended Nuclear stress test and patient refused.  3. Evaluate BP, benefit vs risk of Ace inhibitor  with DM and AKI. Patients lisinopril was d/c'd d/t AKI and normal pressures.   EXAM:  General: laying in bed in no acute distress, dystonic features present  HEENT: PERRLA, moist mucous membranes  CV: s1s2, RRR, no murmur  Pulm: normal work of breathing  Abd: discomfort with palpation of left upper quadrant, soft, no rebound, no guarding  Ext: no edema  Neuro: alert and oriented x3  Discharge follow up:  Follow-up Information   Follow up with [facility], MD On 02/10/2013. (at 9:45am)    Contact information:   [address] [place] [address] [phone]       [facility] On [facility]07[facility]2013. (at [facility] for a nurse visit to check blood pressure.)    Contact information:   [address] [place] [address]      Discharge Medications - Continued all home medications on discharge, with the exception of Lisinopril and Ibuprofen d/t AKI.   - Prescribed Nitro 0.4mg  PRN for chest pain and Docusate for constipation.   [name], DO of [facility] 02/05/2013 10:10 PM

## 2013-02-02 NOTE — Progress Notes (Signed)
Patient ID: Krista [name], female   DOB: [birth year], 57 y.o.   MRN: [id number] Family Medicine Teaching Service Daily Progress Note Service Page: [phone]  Subjective:  Pt has "ready go home." She denies any pain. She has eaten breakfast.   Objective: Temp:  [97.2 F (36.2 C)-98.6 F (37 C)] 97.7 F (36.5 C) (04/02 0515) Pulse Rate:  [84-99] 92 (04/02 0515) Resp:  [18-20] 18 (04/02 0515) BP: (105-123)/(73-85) 118/76 mmHg (04/02 0515) SpO2:  [97 %-100 %] 100 % (04/02 0515) Weight:  [154 lb 11.2 oz (70.171 kg)] 154 lb 11.2 oz (70.171 kg) (04/01 2205)  Intake/Output Summary (Last 24 hours) at 02/02/13 0918 Last data filed at 02/02/13 0818  Gross per 24 hour  Intake   1660 ml  Output    350 ml  Net   1310 ml   UOP: 0.71mL/kg/hr  Exam: Gen: NAD, alert, cooperative with exam HEENT: NCAT, MMM CV: RRR, good S1/S2, no murmur Resp: mild crackles at bases, non-labored Abd: SNTND, BS present, no guarding or organomegaly Ext: No edema, warm and well perfused Neuro: Alert and oriented X3, Strength 5/5 in all 4 extrmities   I have reviewed the patient's medications, labs, imaging, and diagnostic testing.  Notable results are summarized below.  CBC BMET   Recent Labs Lab 01/30/13 2211 01/31/13 0703 02/01/13 0938  WBC 6.9 6.0 6.3  HGB 11.8* 11.6* 11.9*  HCT 33.7* 32.9* 34.0*  PLT 395 344 372    Recent Labs Lab 01/31/13 0703 02/01/13 0938 02/02/13 0505  NA 135 135 133*  K 4.3 4.1 3.8  CL 98 98 98  CO2 22 23 25   BUN 16 12 13   CREATININE 0.95  0.97 0.84 0.88  GLUCOSE 114* 97 132*  CALCIUM 9.6 9.6 9.3        Recent Labs Lab 01/30/13 2211 01/31/13 0703 01/31/13 0915  TROPONINI <0.30 <0.30 <0.30   Scheduled Meds: . aspirin EC  81 mg Oral Daily  . benztropine  1 mg Oral QHS  . docusate sodium  100 mg Oral Daily  . gabapentin  300 mg Oral TID  . heparin  5,000 Units Subcutaneous Q8H  . pantoprazole  40 mg Oral Daily   Continuous Infusions: . sodium  chloride 100 mL/hr (02/01/13 2156)   PRN Meds:.acetaminophen, morphine injection, nitroGLYCERIN, ondansetron (ZOFRAN) IV   Imaging/Diagnostic Tests: DG abdomen acute with chest 01/30/2013 IMPRESSION:  No evidence of active pulmonary disease. Nonobstructive bowel gas  pattern.  CT abd pelvis w/o 01/30/2013 IMPRESSION:  No acute inflammatory process demonstrated in the abdomen or  pelvis. Multiple vertebral compression fractures with sclerosis  and degenerative change. Consider pathologic fractures in the  setting of renal osteodystrophy or metastasis.    Plan: Krista [name] is a 57 y.o. year old female PMH of HTN, DM-2, GERD and schizophrenia who presents with complaints of chest and abdominal pain. Patient recently discharged on 01/24/13 following admission for septic shock, AKI, and delirium.   Chest pain - EKG revealed NSR with no ST or T wave changes. Pain now resolved.  - troponin negative X3 - SL Nitro PRN, Morphine PRN for severe pain. Will continue daily Aspirin.  - Risk stratification labs - Lipid panel, TSH, A1--> WNL, LDL 86, HDL 56  - Cardiology consulted and recommends nuclear stress, patient apparently refusing.   Abdominal pain - Patient has had recent admission for septic shock secondary to urosepsis with no current UTI symptoms, now denies pain.  - DDX - Pyelonephritis (less likely given  no fever, leukocytosis, or findings on CT), Constipation, Viral gastroenteritis (denies diarrhea, nausea/vomiting), Inflammatory bowel disease, Colitis (no findings on CT scan). Unclear etiology at this time.  - Acute abdominal series negative.  - CT abdomen/pelvis was negative for intrabdominal/pelvic inflammatory or infectious processes.  - Patient was given 1 dose of Cefepime in the ED which would cover for pyelonephritis.  - Will continue to monitor closely and treat pain aggressively. Colace daily for constipation.   AKI  - Creatinine elevated at 1.22 on admission (0.81 on  3/24) decreased to 0.95 on 3/31 - Likely secondary to poor PO intake (reported by ED provider after talking with patient's mother)  - IV fluids: NS @ 100 mL/hr; UOP: 0.2 last 24 hr  Compression fractures  - Seen on CT abdomen/pelvis. Patient denies recent fall.  - Fractures appear chronic in nature.  - Will consider further work up during admission  - Consider Calcitonin to treat vertebral compression Fx, however these are not acute   DM-2  - A1C- 6.6 - CBG's TID AC and QHS -87-107 - Holding SSI at this time at CBG's are well controlled and last A1C (07/2012) was 6.6.  - Will continue home Gabapentin   HTN  - BP stable currently, high of 146/96 and 131/91, everything else WNL - Will hold home Lisinopril/HCTZ in the setting of AKI   Hyperlipidemia  - LDL 86 and HDL 56, no intervention needed at this time  GERD  - Daily Protonix   Schizophrenia  - Will continue home Cogentin, will contact [facility] for current antipsychotic medication doses - awaiting Psych recommendations for discharge  FEN/GI: Carb modified diet; NS @ 100 mL/hr  Prophylaxis: Heparin SQ  Disposition: SNF vs home with HH, challenging as pt doesn't want SNF but doesn't seem to be able to care for herself, will discuss with team; awaiting psych recommendations.  Code Status: Full Code   Krista Pacini, DO 02/02/2013, 9:14 AM

## 2013-02-02 NOTE — Consult Note (Signed)
Reason for Consult: Capacity evaluation and has history of schizophrenia Referring Physician: Dr. Jamie [name] is an 57 y.o. female.  HPI: Patient presented to the [facility] with the multiple medical problems especially chest pain, abdominal pain, diabetes, compression fractures, AKI and GERD. Patient also suffering with the chronic, undifferentiated schizophrenia and has been compliant with her medication management from the [facility] Patient denies current symptoms of depression, anxiety, Mania, and psychosis. Patient has been stable psychiatrically and has no danger to herself or other people. Patient uses transportation services to her appointments  MSE: Patient was calm and quite, cooperative to. Patient was awake, alert, oriented x4. patient has slurred speech  Occasionally, difficult to follow through but overall, she is able to participate in evaluation. Patient has denied suicidal and homicidal ideation, intents or plans. She has no evidence of psychotic symptoms.  Past Medical History  Diagnosis Date  . Arthritis   . Diabetes mellitus   . Hyperlipidemia   . Hypertension   . Allergy   . GERD (gastroesophageal reflux disease)   . Back pain     History reviewed. No pertinent past surgical history.  Family History  Problem Relation Age of Onset  . Diabetes Mother   . COPD Mother   . Heart disease Mother   . Hyperlipidemia Mother   . Hypertension Mother   . Arthritis Mother     Social History:  reports that she has been smoking Cigarettes.  She has been smoking about 1.00 pack per day. She has never used smokeless tobacco. She reports that she does not drink alcohol. Her drug history is not on file.  Allergies:  Allergies  Allergen Reactions  . Codeine     REACTION: itching    Medications: I have reviewed the patient's current medications.  Results for orders placed during the hospital encounter of 01/30/13 (from the past 48  hour(s))  GLUCOSE, CAPILLARY     Status: Abnormal   Collection Time    01/31/13  4:27 PM      Result Value Range   Glucose-Capillary 119 (*) 70 - 99 mg/dL  GLUCOSE, CAPILLARY     Status: Abnormal   Collection Time    01/31/13  8:59 PM      Result Value Range   Glucose-Capillary 116 (*) 70 - 99 mg/dL  GLUCOSE, CAPILLARY     Status: Abnormal   Collection Time    02/01/13  7:48 AM      Result Value Range   Glucose-Capillary 100 (*) 70 - 99 mg/dL  CBC     Status: Abnormal   Collection Time    02/01/13  9:38 AM      Result Value Range   WBC 6.3  4.0 - 10.5 K/uL   RBC 3.92  3.87 - 5.11 MIL/uL   Hemoglobin 11.9 (*) 12.0 - 15.0 g/dL   HCT 16.1 (*) 09.6 - 04.5 %   MCV 86.7  78.0 - 100.0 fL   MCH 30.4  26.0 - 34.0 pg   MCHC 35.0  30.0 - 36.0 g/dL   RDW 40.9  81.1 - 91.4 %   Platelets 372  150 - 400 K/uL  BASIC METABOLIC PANEL     Status: Abnormal   Collection Time    02/01/13  9:38 AM      Result Value Range   Sodium 135  135 - 145 mEq/L   Potassium 4.1  3.5 - 5.1 mEq/L   Chloride 98  96 - 112 mEq/L   CO2 23  19 - 32 mEq/L   Glucose, Bld 97  70 - 99 mg/dL   BUN 12  6 - 23 mg/dL   Creatinine, Ser 1.61  0.50 - 1.10 mg/dL   Calcium 9.6  8.4 - 09.6 mg/dL   GFR calc non Af Amer 76 (*) >90 mL/min   GFR calc Af Amer 88 (*) >90 mL/min   Comment:            The eGFR has been calculated     using the CKD EPI equation.     This calculation has not been     validated in all clinical     situations.     eGFR's persistently     <90 mL/min signify     possible Chronic Kidney Disease.  GLUCOSE, CAPILLARY     Status: Abnormal   Collection Time    02/01/13 12:06 PM      Result Value Range   Glucose-Capillary 180 (*) 70 - 99 mg/dL  GLUCOSE, CAPILLARY     Status: None   Collection Time    02/01/13  4:29 PM      Result Value Range   Glucose-Capillary 87  70 - 99 mg/dL  GLUCOSE, CAPILLARY     Status: Abnormal   Collection Time    02/01/13 10:03 PM      Result Value Range    Glucose-Capillary 109 (*) 70 - 99 mg/dL  BASIC METABOLIC PANEL     Status: Abnormal   Collection Time    02/02/13  5:05 AM      Result Value Range   Sodium 133 (*) 135 - 145 mEq/L   Potassium 3.8  3.5 - 5.1 mEq/L   Chloride 98  96 - 112 mEq/L   CO2 25  19 - 32 mEq/L   Glucose, Bld 132 (*) 70 - 99 mg/dL   BUN 13  6 - 23 mg/dL   Creatinine, Ser 0.45  0.50 - 1.10 mg/dL   Calcium 9.3  8.4 - 40.9 mg/dL   GFR calc non Af Amer 72 (*) >90 mL/min   GFR calc Af Amer 84 (*) >90 mL/min   Comment:            The eGFR has been calculated     using the CKD EPI equation.     This calculation has not been     validated in all clinical     situations.     eGFR's persistently     <90 mL/min signify     possible Chronic Kidney Disease.  GLUCOSE, CAPILLARY     Status: Abnormal   Collection Time    02/02/13  7:26 AM      Result Value Range   Glucose-Capillary 107 (*) 70 - 99 mg/dL  GLUCOSE, CAPILLARY     Status: Abnormal   Collection Time    02/02/13 11:24 AM      Result Value Range   Glucose-Capillary 101 (*) 70 - 99 mg/dL    No results found.  Positive for Schizophrenia undifferentiated Blood pressure 100/71, pulse 91, temperature 97 F (36.1 C), temperature source Oral, resp. rate 19, height 5\' 5"  (1.651 m), weight 154 lb 11.2 oz (70.171 kg), SpO2 97.00%.   Assessment/Plan: Chronic schizophrenia, undifferentiated  Patient has capacity to make her medical decisions and living arrangements. Patient has been psychiatrically asymptomatic on the currently stable to be the chest to the outpatient psychiatric services when medically  cleared. Patient may benefit from the home health care and wraparound services and outpatient psychiatry services at [facility] May contact to social services regarding appropriate disposition plans as needed.   Krista [name],Krista [name]. 02/02/2013, 1:51 PM

## 2013-02-03 LAB — BASIC METABOLIC PANEL
Calcium: 8.7 mg/dL (ref 8.4–10.5)
GFR calc Af Amer: >90 mL/min (ref >90)
GFR calc non Af Amer: 79 mL/min — ABNORMAL LOW (ref >90)
Glucose, Bld: 148 mg/dL — ABNORMAL HIGH (ref 70–99)
Potassium: 3.4 mEq/L — ABNORMAL LOW (ref 3.5–5.1)
Sodium: 128 mEq/L — ABNORMAL LOW (ref 135–145)

## 2013-02-03 LAB — GLUCOSE, CAPILLARY: Glucose-Capillary: 116 mg/dL — ABNORMAL HIGH (ref 70–99)

## 2013-02-03 MED ORDER — DSS 100 MG PO CAPS: 100.0000 mg | ORAL_CAPSULE | Freq: Every day | ORAL | Status: DC

## 2013-02-03 MED ORDER — NITROGLYCERIN 0.4 MG SL SUBL: 0.4000 mg | SUBLINGUAL_TABLET | SUBLINGUAL | Status: DC | PRN

## 2013-02-03 MED ORDER — POTASSIUM CHLORIDE CRYS ER 20 MEQ PO TBCR
20.0000 meq | EXTENDED_RELEASE_TABLET | Freq: Once | ORAL | Status: AC
  Administered 2013-02-03: 20 meq via ORAL
  Filled 2013-02-03: qty 1

## 2013-02-03 NOTE — Clinical Social Work Psych Note (Signed)
Clinical Social Work Department CLINICAL SOCIAL WORK PSYCHIATRY SERVICE LINE ASSESSMENT 02/03/2013  Patient:  Krista [name]  Account:  [id number]  Admit Date:  01/30/2013  Clinical Social Worker:  Vonita Moss, Theresia Majors  Date/Time:  02/03/2013 02:23 PM Referred by:  RN  Date referred:  02/03/2013 Reason for Referral  Behavioral Health Issues   Presenting Symptoms/Problems (In the person's/family's own words):   Pt states she is here at the hospital because her stomach hurts   Abuse/Neglect/Trauma History (check all that apply)  Denies history   Abuse/Neglect/Trauma Comments:   none reported or noted   Psychiatric History (check all that apply)  Denies history   Psychiatric medications:  none reported or noted   Current Mental Health Hospitalizations/Previous Mental Health History:   none reported or noted   Current provider:   none reported or noted   Place and Date:   none reported or noted   Current Medications:   Current facility-administered medications:acetaminophen (TYLENOL) tablet 650 mg, 650 mg, Oral, Q4H PRN, Tommie Sams, DO, 650 mg at 02/03/13 1001;  aspirin EC tablet 81 mg, 81 mg, Oral, Daily, Tommie Sams, DO, 81 mg at 02/03/13 5621;  benztropine (COGENTIN) tablet 1 mg, 1 mg, Oral, QHS, Jayce G Cook, DO, 1 mg at 02/02/13 2102;  docusate sodium (COLACE) capsule 100 mg, 100 mg, Oral, Daily, Tommie Sams, DO, 100 mg at 02/03/13 0948  gabapentin (NEURONTIN) capsule 300 mg, 300 mg, Oral, TID, Tommie Sams, DO, 300 mg at 02/03/13 0948;  heparin injection 5,000 Units, 5,000 Units, Subcutaneous, Q8H, Tommie Sams, DO, 5,000 Units at 02/03/13 1353;  morphine 2 MG/ML injection 0.5 mg, 0.5 mg, Intravenous, Q4H PRN, [employer], DO, 0.5 mg at 02/03/13 0405;  nitroGLYCERIN (NITROSTAT) SL tablet 0.4 mg, 0.4 mg, Sublingual, Q5 Min x 3 PRN, Jayce G Cook, DO  ondansetron (ZOFRAN) injection 4 mg, 4 mg, Intravenous, Q6H PRN, Verdis Frederickson Cook, DO, 4 mg at 02/01/13 0423;  pantoprazole  (PROTONIX) EC tablet 40 mg, 40 mg, Oral, Daily, Tommie Sams, DO, 40 mg at 02/03/13 3086   Previous Impatient Admission/Date/Reason:   none reported or noted   Emotional Health / Current Symptoms    Suicide/Self Harm  None reported   Suicide attempt in the past:   none reported or noted   Other harmful behavior:   none reported or noted   Psychotic/Dissociative Symptoms  None reported   Other Psychotic/Dissociative Symptoms:   none reported or noted    Attention/Behavioral Symptoms  Within Normal Limits   Other Attention / Behavioral Symptoms:   none reported or noted    Cognitive Impairment  Orientation - Place  Orientation - Self  Orientation - Situation  Orientation - Time   Other Cognitive Impairment:   none reported or noted    Mood and Adjustment  Mood Congruent    Stress, Anxiety, Trauma, Any Recent Loss/Stressor  None reported   Anxiety (frequency):   none reported or noted   Phobia (specify):   none reported or noted   Compulsive behavior (specify):   none reported or noted   Obsessive behavior (specify):   none reported or noted   Other:   none reported or noted   Substance Abuse/Use  None   SBIRT completed (please refer for detailed history):  N  Self-reported substance use:   none reported or noted   Urinary Drug Screen Completed:  N Alcohol level:   n/a    Environmental/Housing/Living Arrangement  With Other Relatives   Who is in the home:   mother Leodis Binet) and brother   Emergency contact:  [phone] Leodis Binet, mother   Financial  Medicare  Medicaid   Patient's Strengths and Goals (patient's own words):   Pt reaches out when she hurts.  Pt has supportive family and stable housing.  Pt is forthcoming with information and complaint with medical care.   Clinical Social Worker's Interpretive Summary:   CSW met with pt at bedside after a psych consult was made for capacity.  Pt was asked questions regarding  orientation and pt was able to accuratley answer.  Pt was on point with situational questions as well.  Pt was alert and oriented. Pt denied AVHD, SI and HI.  Pt denied current/history of abuse.  Pt denied ETOH and SA.  Pt has speech empediment that makes the pt difficult to understand at times, but nonetheless she is understood and understands.   Disposition:  Psych Clinical Social Worker signing off   Vickii Penna, [place] [phone]  Clinical Social Work

## 2013-02-03 NOTE — Progress Notes (Signed)
Physical Therapy Treatment Patient Details Name: Krista [name] MRN: [id number] DOB: 12/13/1955 Today's Date: 02/03/2013 Time: 1201-1216 PT Time Calculation (min): 15 min  PT Assessment / Plan / Recommendation Comments on Treatment Session  Pt improved today with better safety.  She still is limited in distance she can walk by back pain. Recommend RW (not sure if she has one at home..it was recommended last admission and HHPT)    Follow Up Recommendations  Home health PT     Does the patient have the potential to tolerate intense rehabilitation     Barriers to Discharge        Equipment Recommendations  Rolling walker with 5" wheels    Recommendations for Other Services    Frequency Min 3X/week   Plan Discharge plan needs to be updated;Frequency remains appropriate    Precautions / Restrictions Precautions Precautions: Fall Precaution Comments: h/o Multiple vertebral compression fractures with sclerosis and degenerative changes   Pertinent Vitals/Pain Pt limited by pain in back    Mobility  Bed Mobility Bed Mobility: Supine to Sit;Sit to Supine Rolling Right: 7: Independent Rolling Left: 7: Independent Supine to Sit: HOB flat;With rails;6: Modified independent (Device/Increase time) Sit to Supine: HOB flat;With rail;6: Modified independent (Device/Increase time) Scooting to [facility]: 6: Modified independent (Device/Increase time);With rail Details for Bed Mobility Assistance: pt is able to move in the bed without physical assistance Transfers Transfers: Sit to Stand;Stand to Sit Sit to Stand: With upper extremity assist;From bed;6: Modified independent (Device/Increase time) Stand to Sit: With upper extremity assist;To bed;6: Modified independent (Device/Increase time);Without upper extremity assist Details for Transfer Assistance: pt needs cues to stop and stand still while therapist prepares to help her( get IV, make sure gown and gait belt are  secure) Ambulation/Gait Ambulation/Gait Assistance: 5: Supervision Assistive device: Rolling walker Ambulation/Gait Assistance Details: one episode of impulsiveness with RW today that needed hands on assistance to redirect RW Gait Pattern: Step-through pattern;Trunk flexed Gait velocity: more even today General Gait Details: pt self limited distance today due to back pain.  She did not lose balance Stairs: No Wheelchair Mobility Wheelchair Mobility: No    Exercises     PT Diagnosis:    PT Problem List:   PT Treatment Interventions:     PT Goals Acute Rehab PT Goals PT Goal Formulation: Patient unable to participate in goal setting Time For Goal Achievement: 02/14/13 Potential to Achieve Goals: Fair Pt will go Supine/Side to Sit: with modified independence;with HOB 0 degrees PT Goal: Supine/Side to Sit - Progress: Met Pt will go Sit to Supine/Side: with modified independence;with HOB 0 degrees PT Goal: Sit to Supine/Side - Progress: Met Pt will go Sit to Stand: with supervision PT Goal: Sit to Stand - Progress: Met Pt will go Stand to Sit: with supervision PT Goal: Stand to Sit - Progress: Met Pt will Transfer Bed to Chair/Chair to Bed: with supervision PT Transfer Goal: Bed to Chair/Chair to Bed - Progress: Met Pt will Ambulate: >150 feet;with supervision;with least restrictive assistive device PT Goal: Ambulate - Progress: Progressing toward goal Pt will Go Up / Down Stairs: 3-5 stairs;with supervision;with rail(s)  Visit Information  Last PT Received On: 02/03/13    Subjective Data  Subjective: "Lets walk" Patient Stated Goal: to walk, but then to return back to bed after a short walk due to back pain   Cognition  Cognition Overall Cognitive Status: History of cognitive impairments - at baseline Arousal/Alertness: Awake/alert Behavior During Session: [facility] for tasks performed Cognition -  Other Comments: pt appears to become impulsive/anxious with back pain     Balance     End of Session PT - End of Session Activity Tolerance: Patient limited by pain Patient left: in bed   GP    Rosey Bath K. Manson Passey, [place] [phone] 02/03/2013, 1:41 PM

## 2013-02-03 NOTE — Progress Notes (Signed)
FMTS Daily Intern Progress Note  Subjective:  Patient reports some abdominal discomfort, intermittent, located per-umbilically, may be associated with heparin shots.  No nausea, no vomiting, eating and drinking. Last bowel movement this am, soft  I have reviewed the patient's medications.  Objective Temp:  [97 F (36.1 C)-98.8 F (37.1 C)] 98.6 F (37 C) (04/03 0442) Pulse Rate:  [91-99] 98 (04/03 0442) Resp:  [18-19] 18 (04/03 0442) BP: (95-120)/(60-80) 95/60 mmHg (04/03 0442) SpO2:  [97 %-100 %] 100 % (04/03 0442) Weight:  [159 lb 2.8 oz (72.2 kg)] 159 lb 2.8 oz (72.2 kg) (04/02 2114)   Intake/Output Summary (Last 24 hours) at 02/03/13 0839 Last data filed at 02/03/13 0723  Gross per 24 hour  Intake    840 ml  Output    100 ml  Net    740 ml    CBG (last 3)   Recent Labs  02/02/13 1607 02/02/13 2111 02/03/13 0735  GLUCAP 112* 117* 122*    General: laying in bed in no acute distress, dystonic features present HEENT: PERRLA, moist mucous membranes CV: s1s2, RRR, no murmur Pulm: normal work of breathing Abd: discomfort with palpation of left upper quadrant, soft, no rebound, no guarding Ext: no edema Neuro: alert and oriented x3  Labs and Imaging  Recent Labs Lab 01/30/13 2211 01/31/13 0703 02/01/13 0938  WBC 6.9 6.0 6.3  HGB 11.8* 11.6* 11.9*  HCT 33.7* 32.9* 34.0*  PLT 395 344 372     Recent Labs Lab 02/01/13 0938 02/02/13 0505 02/03/13 0635  NA 135 133* 128*  K 4.1 3.8 3.4*  CL 98 98 97  CO2 23 25 22   BUN 12 13 10   CREATININE 0.84 0.88 0.82  GLUCOSE 97 132* 148*  CALCIUM 9.6 9.3 8.7     Assessment and Plan Krista [name] is a 57 y.o. year old female PMH of HTN, DM-2, GERD and schizophrenia who presents with complaints of chest and abdominal pain. Patient recently discharged on 01/24/13 following admission for septic shock, AKI, and delirium.   Abdominal pain - Patient has had recent admission for septic shock secondary to urosepsis  with no current UTI symptoms. Urine culture from 3/30 shows no growth, making UTI unlikely. CT and abdominal series negative for acute process. Unclear etiology. Monitor in outpatient setting - continue colace for constipation.   AKI: likely from decreased oral intake - resolved  Hyponatremia:  Patient appears euvolemic. Differential includes SIADH  vs primary polydypsia at this point.  Reviewed psych medications and did not see that prolyxin or cogentin cause SIADH. Primary polydipsia could be an explanation. - plan to discharge with close follow up with PCP   Compression fractures  - Seen on CT abdomen/pelvis. Patient denies recent fall.  - Fractures appear chronic in nature.   DM-2  - A1C- 6.6  - CBG's TID AC and QHS -87-107  - Holding SSI at this time at CBG's are well controlled and last A1C (07/2012) was 6.6.  - Will continue home Gabapentin   Chest pain - EKG revealed NSR with no ST or T wave changes. Pain now resolved.  - troponin negative X3  - SL Nitro PRN, Morphine PRN for severe pain. Will continue daily Aspirin.  - Risk stratification labs - Lipid panel, TSH, A1--> WNL, LDL 86, HDL 56  - Cardiology consulted and recommended nuclear stress, patient apparently refusing.   HTN  - BP stable currently off of lisinopril/HCTZ - discharge home with  Hyperlipidemia  -  LDL 86 and HDL 56, no intervention needed at this time  GERD  - Daily Protonix   Schizophrenia  - Will continue home Cogentin. Patient on depo antipsychotic: prolyxin - psych consult reported patient having capacity and need for follow up as outpatient.  Vesta Mixer appointment for tomorrow  FEN/GI: Carb modified diet; saline lock Prophylaxis: Heparin SQ  Disposition: PT recommending SNF. Patient refusing. Will order home health and touch base with family regarding their ability to take care of her.   Code Status: Full Code

## 2013-02-03 NOTE — Progress Notes (Signed)
I discussed the care plan with Dr Gwenlyn Saran and the [facility] team and agree with assessment and plan as documented in the progress note above.

## 2013-02-03 NOTE — Care Management Note (Signed)
   CARE MANAGEMENT NOTE 02/03/2013  Patient:  Krista [name], Krista [name]   Account Number:  [id number]  Date Initiated:  02/03/2013  Documentation initiated by:  Deklyn Gibbon  Subjective/Objective Assessment:   Order to resume [facility] services     Action/Plan:   Pt active with [facility] for [facility] and HHPT.   Anticipated DC Date:  02/03/2013   Anticipated DC Plan:  HOME W HOME HEALTH SERVICES         Choice offered to / List presented to:          [facility] arranged  HH-1 RN  HH-2 PT      [facility] agency  [facility]   Status of service:  Completed, signed off Medicare Important Message given?   (If response is "NO", the following Medicare IM given date fields will be blank) Date Medicare IM given:   Date Additional Medicare IM given:    Discharge Disposition:  HOME W HOME HEALTH SERVICES  Per UR Regulation:    If discussed at Long Length of Stay Meetings, dates discussed:    Comments:

## 2013-02-06 NOTE — Discharge Summary (Signed)
I examined this patient and discussed the care plan with Dr Kuneff and the FPTS team and agree with assessment and plan as documented in the progress note above.  

## 2013-02-10 ENCOUNTER — Inpatient Hospital Stay: Payer: Medicare Other | Admitting: Family Medicine

## 2013-02-17 ENCOUNTER — Emergency Department (HOSPITAL_COMMUNITY): Payer: Medicare Other

## 2013-02-17 ENCOUNTER — Inpatient Hospital Stay (HOSPITAL_COMMUNITY)
Admission: EM | Admit: 2013-02-17 | Discharge: 2013-02-22 | DRG: 372 | Disposition: A | Discharge status: Home / Self Care | Payer: Medicare Other | Attending: Family Medicine | Admitting: Family Medicine

## 2013-02-17 ENCOUNTER — Encounter (HOSPITAL_COMMUNITY): Payer: Self-pay | Admitting: Emergency Medicine

## 2013-02-17 DIAGNOSIS — F29 Unspecified psychosis not due to a substance or known physiological condition: Secondary | ICD-10-CM

## 2013-02-17 DIAGNOSIS — N390 Urinary tract infection, site not specified: Secondary | ICD-10-CM | POA: Diagnosis present

## 2013-02-17 DIAGNOSIS — A0472 Enterocolitis due to Clostridium difficile, not specified as recurrent: Principal | ICD-10-CM | POA: Diagnosis present

## 2013-02-17 DIAGNOSIS — N179 Acute kidney failure, unspecified: Secondary | ICD-10-CM

## 2013-02-17 DIAGNOSIS — I10 Essential (primary) hypertension: Secondary | ICD-10-CM | POA: Diagnosis present

## 2013-02-17 DIAGNOSIS — F209 Schizophrenia, unspecified: Secondary | ICD-10-CM | POA: Diagnosis present

## 2013-02-17 DIAGNOSIS — E860 Dehydration: Secondary | ICD-10-CM | POA: Diagnosis present

## 2013-02-17 DIAGNOSIS — R4182 Altered mental status, unspecified: Secondary | ICD-10-CM

## 2013-02-17 DIAGNOSIS — Z87440 Personal history of urinary (tract) infections: Secondary | ICD-10-CM

## 2013-02-17 DIAGNOSIS — F17200 Nicotine dependence, unspecified, uncomplicated: Secondary | ICD-10-CM | POA: Diagnosis present

## 2013-02-17 DIAGNOSIS — E119 Type 2 diabetes mellitus without complications: Secondary | ICD-10-CM | POA: Diagnosis present

## 2013-02-17 DIAGNOSIS — E876 Hypokalemia: Secondary | ICD-10-CM

## 2013-02-17 DIAGNOSIS — E1142 Type 2 diabetes mellitus with diabetic polyneuropathy: Secondary | ICD-10-CM

## 2013-02-17 DIAGNOSIS — K219 Gastro-esophageal reflux disease without esophagitis: Secondary | ICD-10-CM | POA: Diagnosis present

## 2013-02-17 DIAGNOSIS — M129 Arthropathy, unspecified: Secondary | ICD-10-CM | POA: Diagnosis present

## 2013-02-17 DIAGNOSIS — E785 Hyperlipidemia, unspecified: Secondary | ICD-10-CM | POA: Diagnosis present

## 2013-02-17 DIAGNOSIS — Z6825 Body mass index (BMI) 25.0-25.9, adult: Secondary | ICD-10-CM

## 2013-02-17 DIAGNOSIS — R627 Adult failure to thrive: Secondary | ICD-10-CM | POA: Diagnosis present

## 2013-02-17 DIAGNOSIS — F200 Paranoid schizophrenia: Secondary | ICD-10-CM | POA: Diagnosis present

## 2013-02-17 HISTORY — DX: Type 2 diabetes mellitus without complications: E11.9

## 2013-02-17 HISTORY — DX: Schizophrenia, unspecified: F20.9

## 2013-02-17 LAB — RAPID URINE DRUG SCREEN, HOSP PERFORMED
Cocaine: NOT DETECTED
Opiates: NOT DETECTED
Tetrahydrocannabinol: NOT DETECTED

## 2013-02-17 LAB — URINE MICROSCOPIC-ADD ON

## 2013-02-17 LAB — LIPASE, BLOOD: Lipase: 61 U/L — ABNORMAL HIGH (ref 11–59)

## 2013-02-17 LAB — URINALYSIS, ROUTINE W REFLEX MICROSCOPIC
Glucose, UA: NEGATIVE mg/dL
Ketones, ur: 15 mg/dL — AB
Protein, ur: 30 mg/dL — AB

## 2013-02-17 LAB — CBC WITH DIFFERENTIAL/PLATELET
HCT: 39.8 % (ref 36.0–46.0)
Hemoglobin: 14 g/dL (ref 12.0–15.0)
Lymphocytes Relative: 14 % (ref 12–46)
Monocytes Absolute: 0.6 10*3/uL (ref 0.1–1.0)
Monocytes Relative: 8 % (ref 3–12)
Neutro Abs: 6.1 10*3/uL (ref 1.7–7.7)
WBC: 7.9 10*3/uL (ref 4.0–10.5)

## 2013-02-17 LAB — COMPREHENSIVE METABOLIC PANEL
AST: 32 U/L (ref 0–37)
BUN: 48 mg/dL — ABNORMAL HIGH (ref 6–23)
CO2: 22 mEq/L (ref 19–32)
Chloride: 94 mEq/L — ABNORMAL LOW (ref 96–112)
Creatinine, Ser: 1.25 mg/dL — ABNORMAL HIGH (ref 0.50–1.10)
GFR calc non Af Amer: 47 mL/min — ABNORMAL LOW (ref >90)
Total Bilirubin: 0.4 mg/dL (ref 0.3–1.2)

## 2013-02-17 LAB — GLUCOSE, CAPILLARY: Glucose-Capillary: 91 mg/dL (ref 70–99)

## 2013-02-17 LAB — CBC
HCT: 35 % — ABNORMAL LOW (ref 36.0–46.0)
RBC: 4.17 MIL/uL (ref 3.87–5.11)
RDW: 13.7 % (ref 11.5–15.5)
WBC: 5.7 10*3/uL (ref 4.0–10.5)

## 2013-02-17 LAB — CREATININE, SERUM
GFR calc Af Amer: 74 mL/min — ABNORMAL LOW (ref >90)
GFR calc non Af Amer: 64 mL/min — ABNORMAL LOW (ref >90)

## 2013-02-17 LAB — POCT I-STAT TROPONIN I

## 2013-02-17 MED ORDER — BENZTROPINE MESYLATE 1 MG PO TABS
1.0000 mg | ORAL_TABLET | Freq: Every day | ORAL | Status: DC
  Administered 2013-02-17 – 2013-02-21 (×5): 1 mg via ORAL
  Filled 2013-02-17 (×6): qty 1

## 2013-02-17 MED ORDER — DEXTROSE 5 % IV SOLN
1.0000 g | Freq: Once | INTRAVENOUS | Status: AC
  Administered 2013-02-17: 1 g via INTRAVENOUS
  Filled 2013-02-17: qty 10

## 2013-02-17 MED ORDER — HEPARIN SODIUM (PORCINE) 5000 UNIT/ML IJ SOLN
5000.0000 [IU] | Freq: Three times a day (TID) | INTRAMUSCULAR | Status: DC
  Administered 2013-02-17 – 2013-02-22 (×14): 5000 [IU] via SUBCUTANEOUS
  Filled 2013-02-17 (×17): qty 1

## 2013-02-17 MED ORDER — SODIUM CHLORIDE 0.9 % IV SOLN
INTRAVENOUS | Status: AC
  Administered 2013-02-17: 16:00:00 via INTRAVENOUS

## 2013-02-17 MED ORDER — SODIUM CHLORIDE 0.9 % IV BOLUS (SEPSIS)
1000.0000 mL | Freq: Once | INTRAVENOUS | Status: AC
  Administered 2013-02-17: 1000 mL via INTRAVENOUS

## 2013-02-17 MED ORDER — INSULIN ASPART 100 UNIT/ML ~~LOC~~ SOLN
0.0000 [IU] | Freq: Three times a day (TID) | SUBCUTANEOUS | Status: DC
  Administered 2013-02-18: 2 [IU] via SUBCUTANEOUS
  Administered 2013-02-19: 3 [IU] via SUBCUTANEOUS
  Administered 2013-02-20 – 2013-02-21 (×4): 2 [IU] via SUBCUTANEOUS

## 2013-02-17 MED ORDER — GEMFIBROZIL 600 MG PO TABS
600.0000 mg | ORAL_TABLET | Freq: Two times a day (BID) | ORAL | Status: DC
  Administered 2013-02-17: 600 mg via ORAL
  Filled 2013-02-17: qty 1

## 2013-02-17 MED ORDER — NITROGLYCERIN 0.4 MG SL SUBL: 0.4000 mg | SUBLINGUAL_TABLET | SUBLINGUAL | Status: DC | PRN

## 2013-02-17 MED ORDER — ASPIRIN EC 81 MG PO TBEC
81.0000 mg | DELAYED_RELEASE_TABLET | Freq: Every day | ORAL | Status: DC
  Administered 2013-02-17 – 2013-02-22 (×6): 81 mg via ORAL
  Filled 2013-02-17 (×7): qty 1

## 2013-02-17 MED ORDER — SODIUM CHLORIDE 0.9 % IV SOLN
INTRAVENOUS | Status: DC
  Administered 2013-02-17 – 2013-02-18 (×3): via INTRAVENOUS

## 2013-02-17 MED ORDER — DEXTROSE 5 % IV SOLN
1.0000 g | INTRAVENOUS | Status: DC
  Administered 2013-02-18 – 2013-02-20 (×3): 1 g via INTRAVENOUS
  Filled 2013-02-17 (×3): qty 10

## 2013-02-17 NOTE — ED Notes (Signed)
MD at bedside. 

## 2013-02-17 NOTE — ED Notes (Signed)
Critical I stat results -  Troponin .10 and Lactic 2.32.  [place] reported labs to Dr Anitra Lauth [phone]

## 2013-02-17 NOTE — ED Notes (Signed)
[facility] notified of transport to [facility].

## 2013-02-17 NOTE — ED Notes (Signed)
Report called to [facility] RN on [phone].

## 2013-02-17 NOTE — ED Notes (Signed)
Patient transported to CT 

## 2013-02-17 NOTE — ED Notes (Signed)
Per medic report, pt was called by pts family members due to "lying on the couch for 2 days" Pt lying in room at this time with her tongue sticking out of her mouth. Pt not communicating with me at this time but shakes her head no when I asked if she was having pain.

## 2013-02-17 NOTE — ED Notes (Signed)
[facility] not 

## 2013-02-17 NOTE — H&P (Signed)
Family Medicine Teaching [facility] Admission History and Physical  Patient name: Krista [name] Medical record number: [id number] Date of birth: February 20, 1956 Age: 57 y.o. Gender: female  Primary Care Provider: Ardyth Gal, MD  Chief Complaint: Dehydration   History of Present Illness: Krista [name] is a 57 y.o. year old female with schizophrenia, diabetes type 2, and hypertension who presents with dehydration. She presented to the [facility] ED via EMS who was called by her family members, because she was was lethargic and not eating, drinking or taking any medications for 2 days. She was evaluated in the ED and found to be severely dehydrated, so she was given 2L of fluid and transferred to our service for further evaluation. She also had an elevated point-of-care troponin to 0.1 with T wave inversions o EKG, but no cardiac intervention was initiated.  Upon arrival to the floor, the patient cannot provide any history. She does not respond appropriate to questioning. I called her family to ask for details about why they called the ambulance for Krista [name] and was told by her mother that the patient as not eating, drinking or taking medications for several days. She states that the patient did not appear distressed and never complained, but would not do anything but lie around the house. When asked if the patient had received her routine injection of antipsychotic medication, the mother stated that the patient did not go to her appointment.   Of note, the patient has been admitted twice in the past month. The first time was for severe sepsis due to UTI with acute kidney injury. The second was for chest pain and her work-up for ACS was negative. At the time she was recommended to receive a nuclear stress test but refused. She was also determined to be have capacity to make her own medical decision by a psychiatry consultation. Therefore, she was discharged to home on 02/03/13.    Past  Medical History: Past Medical History  Diagnosis Date  . Arthritis   . Diabetes mellitus   . Hyperlipidemia   . Hypertension   . Allergy   . GERD (gastroesophageal reflux disease)   . Back pain     Past Surgical History: History reviewed. No pertinent past surgical history.  Social History: History   Social History  . Marital Status: Legally Separated    Spouse Name: N/A    Number of Children: N/A  . Years of Education: N/A   Social History Main Topics  . Smoking status: Current Every Day Smoker -- 1.00 packs/day    Types: Cigarettes  . Smokeless tobacco: Never Used  . Alcohol Use: No  . Drug Use: None  . Sexually Active: None   Other Topics Concern  . None   Social History Narrative  . None    Family History: Family History  Problem Relation Age of Onset  . Diabetes Mother   . COPD Mother   . Heart disease Mother   . Hyperlipidemia Mother   . Hypertension Mother   . Arthritis Mother     Allergies: Allergies  Allergen Reactions  . Codeine     REACTION: itching    Current Facility-Administered Medications  Medication Dose Route Frequency Provider Last Rate Last Dose  . 0.9 %  sodium chloride infusion   Intravenous STAT Gwyneth Sprout, MD 150 mL/hr at 02/17/13 1552     Review Of Systems: Per HPI with the following additions: unable to obtain from patient given mental status  Otherwise 12  point review of systems was performed and was unremarkable.  Physical Exam: Temp:  [98.3 F (36.8 C)-99.1 F (37.3 C)] 98.3 F (36.8 C) (04/17 1739) Pulse Rate:  [97-113] 100 (04/17 1739) Resp:  [22] 22 (04/17 1739) BP: (109-134)/(73-100) 120/80 mmHg (04/17 1739) SpO2:  [95 %-100 %] 100 % (04/17 1739) Weight:  [147 lb 0.8 oz (66.7 kg)] 147 lb 0.8 oz (66.7 kg) (04/17 1739)   General: chronically ill appearing WF but non distressed, lying in bed with tongue protruding and intermittent non purposeful movement of upper extremities  HEENT: sunken cheeks, OP dry  with protruded tongue Heart: S1, S2 normal, no murmur, rub or gallop, regular rate and rhythm Lungs: clear to auscultation, no wheezes or rales and unlabored breathing Abdomen: abdomen is soft without significant tenderness, masses, organomegaly or guarding Extremities: atrophied muscles of upper and lower extremities  Skin:no rashes, no ecchymoses, no petechiae Neurology: patient responsive to my presence with laughter and movement, but minimally conversant, may be responding to internal stimuli, no focal deficits   Labs and Imaging:  Results for orders placed during the hospital encounter of 02/17/13 (from the past 24 hour(s))  GLUCOSE, CAPILLARY     Status: Abnormal   Collection Time    02/17/13 12:50 PM      Result Value Range   Glucose-Capillary 175 (*) 70 - 99 mg/dL   Comment 1 Notify RN    URINALYSIS, ROUTINE W REFLEX MICROSCOPIC     Status: Abnormal   Collection Time    02/17/13  1:17 PM      Result Value Range   Color, Urine AMBER (*) YELLOW   APPearance CLOUDY (*) CLEAR   Specific Gravity, Urine 1.022  1.005 - 1.030   pH 5.5  5.0 - 8.0   Glucose, UA NEGATIVE  NEGATIVE mg/dL   Hgb urine dipstick NEGATIVE  NEGATIVE   Bilirubin Urine LARGE (*) NEGATIVE   Ketones, ur 15 (*) NEGATIVE mg/dL   Protein, ur 30 (*) NEGATIVE mg/dL   Urobilinogen, UA 1.0  0.0 - 1.0 mg/dL   Nitrite NEGATIVE  NEGATIVE   Leukocytes, UA SMALL (*) NEGATIVE  URINE RAPID DRUG SCREEN (HOSP PERFORMED)     Status: None   Collection Time    02/17/13  1:17 PM      Result Value Range   Opiates NONE DETECTED  NONE DETECTED   Cocaine NONE DETECTED  NONE DETECTED   Benzodiazepines NONE DETECTED  NONE DETECTED   Amphetamines NONE DETECTED  NONE DETECTED   Tetrahydrocannabinol NONE DETECTED  NONE DETECTED   Barbiturates NONE DETECTED  NONE DETECTED  URINE MICROSCOPIC-ADD ON     Status: Abnormal   Collection Time    02/17/13  1:17 PM      Result Value Range   Squamous Epithelial / LPF FEW (*) RARE   WBC,  UA 3-6  <3 WBC/hpf   Bacteria, UA MANY (*) RARE  CBC WITH DIFFERENTIAL     Status: Abnormal   Collection Time    02/17/13  1:20 PM      Result Value Range   WBC 7.9  4.0 - 10.5 K/uL   RBC 4.62  3.87 - 5.11 MIL/uL   Hemoglobin 14.0  12.0 - 15.0 g/dL   HCT 16.1  09.6 - 04.5 %   MCV 86.1  78.0 - 100.0 fL   MCH 30.3  26.0 - 34.0 pg   MCHC 35.2  30.0 - 36.0 g/dL   RDW 40.9  81.1 - 91.4 %  Platelets 302  150 - 400 K/uL   Neutrophils Relative 78 (*) 43 - 77 %   Neutro Abs 6.1  1.7 - 7.7 K/uL   Lymphocytes Relative 14  12 - 46 %   Lymphs Abs 1.1  0.7 - 4.0 K/uL   Monocytes Relative 8  3 - 12 %   Monocytes Absolute 0.6  0.1 - 1.0 K/uL   Eosinophils Relative 0  0 - 5 %   Eosinophils Absolute 0.0  0.0 - 0.7 K/uL   Basophils Relative 0  0 - 1 %   Basophils Absolute 0.0  0.0 - 0.1 K/uL  COMPREHENSIVE METABOLIC PANEL     Status: Abnormal   Collection Time    02/17/13  1:20 PM      Result Value Range   Sodium 134 (*) 135 - 145 mEq/L   Potassium 3.4 (*) 3.5 - 5.1 mEq/L   Chloride 94 (*) 96 - 112 mEq/L   CO2 22  19 - 32 mEq/L   Glucose, Bld 169 (*) 70 - 99 mg/dL   BUN 48 (*) 6 - 23 mg/dL   Creatinine, Ser 7.82 (*) 0.50 - 1.10 mg/dL   Calcium 9.6  8.4 - 95.6 mg/dL   Total Protein 6.6  6.0 - 8.3 g/dL   Albumin 3.4 (*) 3.5 - 5.2 g/dL   AST 32  0 - 37 U/L   ALT 13  0 - 35 U/L   Alkaline Phosphatase 145 (*) 39 - 117 U/L   Total Bilirubin 0.4  0.3 - 1.2 mg/dL   GFR calc non Af Amer 47 (*) >90 mL/min   GFR calc Af Amer 55 (*) >90 mL/min  LIPASE, BLOOD     Status: Abnormal   Collection Time    02/17/13  1:20 PM      Result Value Range   Lipase 61 (*) 11 - 59 U/L  ETHANOL     Status: None   Collection Time    02/17/13  1:20 PM      Result Value Range   Alcohol, Ethyl (B) <11  0 - 11 mg/dL  POCT I-STAT TROPONIN I     Status: Abnormal   Collection Time    02/17/13  1:28 PM      Result Value Range   Troponin i, poc 0.10 (*) 0.00 - 0.08 ng/mL   Comment NOTIFIED PHYSICIAN     Comment 3            CG4 I-STAT (LACTIC ACID)     Status: Abnormal   Collection Time    02/17/13  1:31 PM      Result Value Range   Lactic Acid, Venous 2.32 (*) 0.5 - 2.2 mmol/L    Dg Chest 2 View  02/17/2013  *RADIOLOGY REPORT*  Clinical Data: Altered mental status  CHEST - 2 VIEW  Comparison: 01/30/2013  Findings: Cardiomediastinal silhouette is stable.  No acute infiltrate or pleural effusion.  No pulmonary edema.  Osteopenia and degenerative changes thoracic spine. Stable compression deformities thoracic spine.  IMPRESSION: No active disease.  No significant change.   Original Report Authenticated By: Natasha Mead, M.D.    Ct Head Wo Contrast  02/17/2013  *RADIOLOGY REPORT*  Clinical Data: Altered mental status  CT HEAD WITHOUT CONTRAST  Technique:  Contiguous axial images were obtained from the base of the skull through the vertex without contrast.  Comparison: CT 01/23/2013  Findings: Mild atrophy no acute infarct.  Negative for hemorrhage or mass.  No  change from the  prior study.  Air-fluid levels in the maxillary and sphenoid sinuses may reflect acute infection.  IMPRESSION: No acute intracranial abnormality.  Mild atrophy  Sinusitis with air-fluid levels.   Original Report Authenticated By: Janeece Riggers, M.D.       Assessment and Plan: Krista [name] is a 57 y.o. year old female  presenting with dehydration secondary to poor PO intake over several days likely related to uncontrolled mental illness.   # Dehydration - Likely 10-15% intravascular depletion given tachycardia w/ stable BP; HR improved s/p 2L fluids NS - Cont NS @ 135 mL/hr - Hold PO since uncertain mental status   # Schizophrenia - Patient supposed to be receiving fluphenazine 25 mg q 14 days, but uncertain of that is actually taking place; Regardless of whether patient is receiving med, her mental illness is clearly preventing her from being able to care for herself as an outpatient as evidence by 3 admission within one month -  Contact Mental Health Services tomorrow about med dosing - Psychiatry consultation - Cont benztropine   # ACS - Rule out, mild elevated trop x 1 in ED an T wave inversion in V2-V5 - Check trop i x 3 - repeat EKG in AM  # Diabetes Mellitus, Type 2 - Hold Metformin - Sliding scale insulin   # Hyperlipidemia - Unclear why patient not on statin - hold home gemfibrozil and niacin until swallow eval passed  # Possible UTI - Patient treated with Rocephin 1 gm  In ED given presence of leukocytes and bacteria in urine; Given recent urosepsis with E. Coli and inability to illicit symptoms, that seems reasonable - Cont 1 gm CTX daily (1st day 4/17)  # GERD - hold home prilosec until cleared   FENGI - NS @ 125 ml/hr; NPO until swallow eval  PPX DISPO  Si Raider. Clinton Sawyer, MD, MBA 02/17/2013, 6:05 PM Family Medicine Resident, PGY-2 [phone] pager

## 2013-02-17 NOTE — ED Provider Notes (Addendum)
History     CSN: [id number]  Arrival date & time 02/17/13  1234   First MD Initiated Contact with Patient 02/17/13 1245      Chief Complaint  Patient presents with  . Altered Mental Status    (Consider location/radiation/quality/duration/timing/severity/associated sxs/prior treatment) HPI Comments: Per EMS family states that pt has been lying on the cough for 2 days and not eating or drinking.  Pt is awake on exam but will not answer questions.  Patient is a 57 y.o. female presenting with altered mental status. The history is provided by the EMS personnel. The history is limited by the condition of the patient and the absence of a caregiver.  Altered Mental Status This is a recurrent problem.    Past Medical History  Diagnosis Date  . Arthritis   . Diabetes mellitus   . Hyperlipidemia   . Hypertension   . Allergy   . GERD (gastroesophageal reflux disease)   . Back pain     History reviewed. No pertinent past surgical history.  Family History  Problem Relation Age of Onset  . Diabetes Mother   . COPD Mother   . Heart disease Mother   . Hyperlipidemia Mother   . Hypertension Mother   . Arthritis Mother     History  Substance Use Topics  . Smoking status: Current Every Day Smoker -- 1.00 packs/day    Types: Cigarettes  . Smokeless tobacco: Never Used  . Alcohol Use: No    OB History   Grav Para Term Preterm Abortions TAB SAB Ect Mult Living                  Review of Systems  Unable to perform ROS Psychiatric/Behavioral: Positive for altered mental status.    Allergies  Codeine  Home Medications   Current Outpatient Rx  Name  Route  Sig  Dispense  Refill  . aspirin EC 81 MG tablet   Oral   Take 81 mg by mouth daily.         . benztropine (COGENTIN) 1 MG tablet   Oral   Take 1 mg by mouth at bedtime.         . docusate sodium 100 MG CAPS   Oral   Take 100 mg by mouth daily.   30 capsule   3   . fluconazole (DIFLUCAN) 200 MG tablet    Oral   Take 1 tablet (200 mg total) by mouth daily.   11 tablet   0   . fluPHENAZine decanoate (PROLIXIN) 25 MG/ML injection   Intramuscular   Inject 25 mg into the muscle every 14 (fourteen) days. No instruction given         . gabapentin (NEURONTIN) 300 MG capsule   Oral   Take 1 capsule (300 mg total) by mouth 3 (three) times daily.   270 capsule   3   . gemfibrozil (LOPID) 600 MG tablet   Oral   Take 600 mg by mouth 2 (two) times daily.         . metFORMIN (GLUCOPHAGE) 500 MG tablet   Oral   Take 500-10,000 mg by mouth 2 (two) times daily with a meal. 2 tabs by mouth qam and 1 tab by mouth qhs         . niacin (NIASPAN) 750 MG CR tablet   Oral   Take 750 mg by mouth at bedtime.         . nitroGLYCERIN (NITROSTAT) 0.4 MG  SL tablet   Sublingual   Place 1 tablet (0.4 mg total) under the tongue every 5 (five) minutes x 3 doses as needed for chest pain.   5 tablet   0   . omeprazole (PRILOSEC) 40 MG capsule   Oral   Take 40 mg by mouth every morning.         Marland Kitchen oxybutynin (DITROPAN) 5 MG tablet   Oral   Take 5 mg by mouth 2 (two) times daily.            BP 109/76  Pulse 113  Resp 22  SpO2 95%  Physical Exam  Nursing note and vitals reviewed. Constitutional: She appears well-developed. She appears cachectic. No distress.  HENT:  Head: Normocephalic and atraumatic.  Severe dry mouth and dry tongue to where tongue is sticking out of the mouth.  Eyes: EOM are normal. Pupils are equal, round, and reactive to light.  Cardiovascular: Regular rhythm, normal heart sounds and intact distal pulses.  Tachycardia present.  Exam reveals no friction rub.   No murmur heard. Pulmonary/Chest: Tachypnea noted. No respiratory distress. She has wheezes. She has no rales.  Abdominal: Soft. Bowel sounds are normal. She exhibits no distension. There is no tenderness. There is no rebound and no guarding.  Musculoskeletal: Normal range of motion. She exhibits no  tenderness.  No edema  Neurological: She is alert. No cranial nerve deficit.  No answering questions but alert  Skin: Skin is warm and dry. No rash noted.  Sallow skin and mild yellow tint.  Small petechia over the face  Psychiatric: She has a normal mood and affect. Her behavior is normal.    ED Course  Procedures (including critical care time)  Labs Reviewed  CBC WITH DIFFERENTIAL - Abnormal; Notable for the following:    Neutrophils Relative 78 (*)    All other components within normal limits  COMPREHENSIVE METABOLIC PANEL - Abnormal; Notable for the following:    Sodium 134 (*)    Potassium 3.4 (*)    Chloride 94 (*)    Glucose, Bld 169 (*)    BUN 48 (*)    Creatinine, Ser 1.25 (*)    Albumin 3.4 (*)    Alkaline Phosphatase 145 (*)    GFR calc non Af Amer 47 (*)    GFR calc Af Amer 55 (*)    All other components within normal limits  LIPASE, BLOOD - Abnormal; Notable for the following:    Lipase 61 (*)    All other components within normal limits  URINALYSIS, ROUTINE W REFLEX MICROSCOPIC - Abnormal; Notable for the following:    Color, Urine AMBER (*)    APPearance CLOUDY (*)    Bilirubin Urine LARGE (*)    Ketones, ur 15 (*)    Protein, ur 30 (*)    Leukocytes, UA SMALL (*)    All other components within normal limits  GLUCOSE, CAPILLARY - Abnormal; Notable for the following:    Glucose-Capillary 175 (*)    All other components within normal limits  URINE MICROSCOPIC-ADD ON - Abnormal; Notable for the following:    Squamous Epithelial / LPF FEW (*)    Bacteria, UA MANY (*)    All other components within normal limits  CG4 I-STAT (LACTIC ACID) - Abnormal; Notable for the following:    Lactic Acid, Venous 2.32 (*)    All other components within normal limits  POCT I-STAT TROPONIN I - Abnormal; Notable for the following:  Troponin i, poc 0.10 (*)    All other components within normal limits  URINE CULTURE  URINE RAPID DRUG SCREEN (HOSP PERFORMED)  ETHANOL    Dg Chest 2 View  02/17/2013  *RADIOLOGY REPORT*  Clinical Data: Altered mental status  CHEST - 2 VIEW  Comparison: 01/30/2013  Findings: Cardiomediastinal silhouette is stable.  No acute infiltrate or pleural effusion.  No pulmonary edema.  Osteopenia and degenerative changes thoracic spine. Stable compression deformities thoracic spine.  IMPRESSION: No active disease.  No significant change.   Original Report Authenticated By: Natasha Mead, M.D.    Ct Head Wo Contrast  02/17/2013  *RADIOLOGY REPORT*  Clinical Data: Altered mental status  CT HEAD WITHOUT CONTRAST  Technique:  Contiguous axial images were obtained from the base of the skull through the vertex without contrast.  Comparison: CT 01/23/2013  Findings: Mild atrophy no acute infarct.  Negative for hemorrhage or mass.  No change from the  prior study.  Air-fluid levels in the maxillary and sphenoid sinuses may reflect acute infection.  IMPRESSION: No acute intracranial abnormality.  Mild atrophy  Sinusitis with air-fluid levels.   Original Report Authenticated By: Janeece Riggers, M.D.     Date: 02/17/2013  Rate: 110  Rhythm: sinus tachycardia  QRS Axis: left  Intervals: normal  ST/T Wave abnormalities: new anterolateral t-wave inversion  Conduction Disutrbances:lvh  Narrative Interpretation:   Old EKG Reviewed: changes noted     1. Dehydration   2. Elevated troponin   3. Altered mental status       MDM   Patient who is unable to give [facility] medical history due to altered mental status was brought in by EMS after being on the couch for 2 days without eating per the family. Family is currently not available for questioning and patient is awake however she is not answering questions. Her skin color is sallow and she appears severely dehydrated. She is tachycardic and mildly tachypneic but has normal blood pressure and oxygen saturations are 94-96% on room air. The patient has a significant history of urosepsis approximately 2 months  ago and hospitalization at the beginning of April. Patient is cachectic and concern for failure to thrive. Her mouth is very dry and her tongue is so dry she cannot keep it inside of her mouth.   Concern for new infection versus substance use versus dehydration as a cause of her altered mental status. It appears to be global and lower suspicion for brain etiology.  CBC, CMP, lipase, UA, UDS, EtOH, lactate, troponin, EKG, chest x-ray, head CT pending. Patient given IV fluids.  1:50 PM No acute pathology on CXR and head CT.  Elevated lactic acid to 2.32 and elevated troponin of 0.10.  CmP and urine pending however feel elevated troponin is most likely secondary to strain. CMP with Cr increase from .8 to 1.24 but o/w wnl.  UA with 3-6wbc and small leukocytes.  LFT's wnl.  Will continue IVF hydration as well as will cover with IV rocephin in case of early infection.  Could be that this is also a psychiatric cause of AMS however due to severe hydration feel that needs to be addressed first.      Gwyneth Sprout, MD 02/17/13 1352  Gwyneth Sprout, MD 02/17/13 1408  Gwyneth Sprout, MD 02/17/13 1419

## 2013-02-17 NOTE — ED Notes (Signed)
Attempted to call report to [facility]. Nurse on receiving unit advised that she will call me back for report.

## 2013-02-18 ENCOUNTER — Encounter (HOSPITAL_COMMUNITY): Payer: Self-pay | Admitting: General Practice

## 2013-02-18 LAB — TROPONIN I
Troponin I: <0.3 ng/mL (ref <0.30)
Troponin I: <0.3 ng/mL (ref <0.30)

## 2013-02-18 LAB — GLUCOSE, CAPILLARY

## 2013-02-18 LAB — COMPREHENSIVE METABOLIC PANEL
Albumin: 2.7 g/dL — ABNORMAL LOW (ref 3.5–5.2)
Alkaline Phosphatase: 134 U/L — ABNORMAL HIGH (ref 39–117)
BUN: 33 mg/dL — ABNORMAL HIGH (ref 6–23)
Calcium: 8.3 mg/dL — ABNORMAL LOW (ref 8.4–10.5)
Creatinine, Ser: 0.87 mg/dL (ref 0.50–1.10)
GFR calc Af Amer: 85 mL/min — ABNORMAL LOW (ref >90)
Glucose, Bld: 105 mg/dL — ABNORMAL HIGH (ref 70–99)
Total Protein: 5.4 g/dL — ABNORMAL LOW (ref 6.0–8.3)

## 2013-02-18 LAB — CBC
HCT: 31.2 % — ABNORMAL LOW (ref 36.0–46.0)
Hemoglobin: 11 g/dL — ABNORMAL LOW (ref 12.0–15.0)
MCV: 84.8 fL (ref 78.0–100.0)
RBC: 3.68 MIL/uL — ABNORMAL LOW (ref 3.87–5.11)
RDW: 14.1 % (ref 11.5–15.5)
WBC: 4.1 10*3/uL (ref 4.0–10.5)

## 2013-02-18 MED ORDER — FLUPHENAZINE DECANOATE 25 MG/ML IJ SOLN
25.0000 mg | INTRAMUSCULAR | Status: DC
  Administered 2013-02-18: 25 mg via INTRAMUSCULAR
  Filled 2013-02-18: qty 1

## 2013-02-18 MED ORDER — METRONIDAZOLE 500 MG PO TABS
500.0000 mg | ORAL_TABLET | Freq: Three times a day (TID) | ORAL | Status: DC
  Administered 2013-02-18 – 2013-02-22 (×13): 500 mg via ORAL
  Filled 2013-02-18 (×15): qty 1

## 2013-02-18 NOTE — Progress Notes (Signed)
FMTS Attending Daily Note: Krista Canady MD [phone] pager office [phone] I have discussed this patient with the resident and reviewed the assessment and plan as documented above. I agree wit the resident's findings and plan.  

## 2013-02-18 NOTE — Progress Notes (Addendum)
1010 called and spoken with Dr. Ermalinda Memos. Pt with positive + c- diff result. Placed on contact isolation. Enteric isoaltion

## 2013-02-18 NOTE — H&P (Signed)
FMTS Attending Admission Note: Krista Eniola,MD I  have seen and examined this patient, reviewed their chart. I have discussed this patient with the resident. I agree with the resident's findings, assessment and care plan.  Briefly: Patient seen and examined by me,she denies any concern,no chest pain,she is hungry,an empty can of pudding was by her bedside which she had eaten herself even though she is NPO awaiting swallow evaluation. Physical exam benign except for faint expiratory wheezing anterior on her chest wall .  Dehydration improved,may not need swallow eval since she tolerated her pudding well,while NPO she will benefit from D51/2 ND instead of NS IV fluid,this can be changed once she started feeding. Patient need repeat EKG. May discuss with cardiology need for stress test since she was supposed to get this from last admission.

## 2013-02-18 NOTE — Progress Notes (Signed)
FMTS Daily Intern Progress Note  Overview: Krista [name] is a 57 y.o. female with schizophrenia, DM Type II, HTN, and 2 recent admissions for urosepsis and chest pain workup for ACS (negative) who presented 4/17 with dehydration to [facility] after 2 days of lethargy, not taking PO or medications Per family, pt did not go to her last psych appt to receive routine antipsychotic injection. Pt also had elevated POC troponin 0.1 with T wave inversions on EKG but no cardiac intervention initiated prior to transfer.   Subjective: Krista [name] has no complaints today. Had episode of diarrhea yesterday.  I have reviewed the patient's medications. No PRNs in 24 hours.  Objective Temp:  [97.3 F (36.3 C)-99.1 F (37.3 C)] 98.2 F (36.8 C) (04/18 0457) Pulse Rate:  [85-113] 85 (04/18 0457) Resp:  [16-22] 19 (04/18 0457) BP: (109-134)/(73-100) 126/88 mmHg (04/18 0457) SpO2:  [95 %-100 %] 100 % (04/18 0457) Weight:  [147 lb 0.8 oz (66.7 kg)-148 lb 13 oz (67.5 kg)] 148 lb 13 oz (67.5 kg) (04/18 0457)   Intake/Output Summary (Last 24 hours) at 02/18/13 0644 Last data filed at 02/18/13 0457  Gross per 24 hour  Intake      0 ml  Output    350 ml  Net   -350 ml    CBG (last 3)   Recent Labs  02/17/13 1250 02/17/13 2044 02/18/13 0605  GLUCAP 175* 91 97    General: NAD, lying in bed, pleasant HEENT: EOMI, sclera clear, o/p clear, tongue appears slightly dried but is no longer protruding from mouth, very poor dentition with only a few teeth CV: RRR with no murmurs, rubs, gallops Pulm: CTAB posteriorly, but with wheezes on expiration, no increased work of breathing Abd: Soft, nontender, nondistended, no organomegaly Ext: No LE edema; legs are cachectic-appearing. Strength 5/5 UE and LE bilaterally Neuro: Awake, alert, responds appropriately to questioning, speech is somewhat difficult to understand with no teeth but not slurred, no focal deficits, EOMI, oriented to person and place but only to year  (not month or day) Psych: Mood seems content, linear thought process, affect appropriate, speech non-pressured, does not seem to be responding to internal stimuli, not catatonic  Labs and Imaging  Recent Labs Lab 02/17/13 1320 02/17/13 1929  WBC 7.9 5.7  HGB 14.0 12.4  HCT 39.8 35.0*  PLT 302 214     Recent Labs Lab 02/17/13 1320 02/17/13 1929  NA 134*  --   K 3.4*  --   CL 94*  --   CO2 22  --   BUN 48*  --   CREATININE 1.25* 0.97  GLUCOSE 169*  --   CALCIUM 9.6  --      Recent Labs Lab 02/17/13 1929 02/18/13 0037  TROPONINI <0.30 <0.30  trop i POC at [facility] - 0.10 UA many bacteria, large bili, 15 ket, 30 protein, small leukocytes  Lactic acid venous - 2.32 Ethyl alcohol neg UTox neg  Dg Chest 2 View  02/17/2013 *RADIOLOGY REPORT* Clinical Data: Altered mental status CHEST - 2 VIEW Comparison: 01/30/2013 Findings: Cardiomediastinal silhouette is stable. No acute infiltrate or pleural effusion. No pulmonary edema. Osteopenia and degenerative changes thoracic spine. Stable compression deformities thoracic spine. IMPRESSION: No active disease. No significant change. Original Report Authenticated By: Natasha Mead, M.D.  Ct Head Wo Contrast  02/17/2013 *RADIOLOGY REPORT* Clinical Data: Altered mental status CT HEAD WITHOUT CONTRAST Technique: Contiguous axial images were obtained from the base of the skull through the vertex without  contrast. Comparison: CT 01/23/2013 Findings: Mild atrophy no acute infarct. Negative for hemorrhage or mass. No change from the prior study. Air-fluid levels in the maxillary and sphenoid sinuses may reflect acute infection. IMPRESSION: No acute intracranial abnormality. Mild atrophy Sinusitis with air-fluid levels. Original Report Authenticated By: Janeece Riggers, M.D.   c difficile positive   Assessment and Plan  Krista [name] is a 57 y.o. year old female presenting with dehydration secondary to poor PO intake over several days likely related to  uncontrolled mental illness.   # Dehydration - Likely 10-15% intravascular depletion given tachycardia w/ stable BP; HR improved s/p 2L fluids NS, vitals currently stable. Appears much better hydrated this morning. C difficile infection may help explain some of dehydration, as pt was found sitting on couch and uncertain how much repleting fluids while possibly losing them via diarrhea. - Passed bedside RN swallow - start diet and d/c IV fluids  # c difficile enterocolitis - C diff positive today.  - Put pt on enteric precautions - 4/18 Started flagyl 500mg  TID PO x 10 days  # Schizophrenia - Patient supposed to be receiving fluphenazine 25 mg q 14 days, but uncertain of that is actually taking place; Regardless of whether patient is receiving med, her mental illness is clearly preventing her from being able to care for herself as an outpatient as evidence by 3 admission within one month  - Patient is poor historian and on phone call last night, family thought she had missed appointment for most recent fluphenazine shot. - Will dose fluphenazine shot today - Cont benztropine  - Ordered ACT team call as this could help patient greatly if not already set up  # ACS - Rule out, mild elevated trop x 1 in ED an T wave inversion in V2-V5 thought likely due to strain with dehydration for unknown number of days. - Troponin i neg x 3, stable T wave inversion on repeat EKG with mild LAD  # Diabetes Mellitus, Type 2  - Hold Metformin, restart when witness improved Cr.  - Sliding scale insulin   # Hyperlipidemia - Unclear why patient not on statin  - hold home gemfibrozil and niacin until swallow eval passed   # Possible UTI - Patient treated with Rocephin 1 gm In ED given presence of leukocytes and bacteria in urine; Given recent urosepsis with E. Coli and inability to illicit symptoms, that seems reasonable  - Cont 1 gm CTX daily (1st day 4/17)  - F/u Urine culture  # GERD - C diff positive -  continue holding home prilosec  FENGI - Regular diet PPX - SQ heparin DISPO - Pending stabilization of mental health CODE status: Full code  Simone Curia Pager: [phone] 02/18/2013, 6:44 AM

## 2013-02-18 NOTE — Evaluation (Signed)
Clinical/Bedside Swallow Evaluation Patient Details  Name: Krista [name] MRN: [id number] Date of Birth: Oct 06, 1956  Today's Date: 02/18/2013 Time: 6578-4696 SLP Time Calculation (min): 23 min  Past Medical History:  Past Medical History  Diagnosis Date  . Arthritis   . Hyperlipidemia   . Hypertension   . Allergy   . GERD (gastroesophageal reflux disease)   . Back pain   . Type II diabetes mellitus   . Schizophrenia     /notes 02/17/2013   Past Surgical History:  Past Surgical History  Procedure Laterality Date  . Tubal ligation     HPI:  57 y.o. year old female with schizophrenia, diabetes type 2,GERD  and hypertension who presents with dehydration not eating, drinking or taking any medications for 2 days. She was evaluated in the ED and found to be severely dehydrated, so she was given 2L of fluid and transferred to our service for further evaluation. She also had an elevated point-of-care troponin to 0.1 with T wave inversions o EKG, but no cardiac intervention was initiated. Admitted past month for severe sepsis due to UTI with acute kidney injury and chest pain and her work-up for ACS was negative. At the time she was recommended to receive a nuclear stress test but refused.  CXR No active disease.     Assessment / Plan / Recommendation Clinical Impression  Pt. exhibited mild oral impairments with prolonged oral prep and mastication.  Intermittent s/s aspiration with thin  x 2 (1 cup, 1 straw) with thin water with pt. taking large consecutive sips from cup/straw.  Subsequent small sips given mod-max verbal cues did not reveal s/s aspiration.  RN stated that pt. coughed with night RN (texture unknown).  Recommend Dys 3 diet texture and thin liquids, no straws, pills whole in applesauce, full supervision to ensure strategies are implemented.  Pt. may need nectar thick liquids if she continues to exhibit s/s aspiration with thin.  ST will follow up.       Aspiration Risk  Mild     Diet Recommendation Dysphagia 3 (Mechanical Soft);Thin liquid   Liquid Administration via: Cup;No straw Medication Administration: Whole meds with puree Supervision: Patient able to self feed;Full supervision/cueing for compensatory strategies Compensations: Slow rate;Small sips/bites    Other  Recommendations Oral Care Recommendations: Oral care BID   Follow Up Recommendations   (to be determined)    Frequency and Duration min 2x/week  2 weeks   Pertinent Vitals/Pain none    SLP Swallow Goals Patient will consume recommended diet without observed clinical signs of aspiration with: Maximal cueing Patient will utilize recommended strategies during swallow to increase swallowing safety with: Maximal cueing   Swallow Study         Oral/Motor/Sensory Function Overall Oral Motor/Sensory Function: Appears within functional limits for tasks assessed Velum: Within Functional Limits   Ice Chips Ice chips: Not tested   Thin Liquid Thin Liquid: Impaired Presentation: Cup;Straw Pharyngeal  Phase Impairments: Cough - Immediate    Nectar Thick Nectar Thick Liquid: Not tested   Honey Thick Honey Thick Liquid: Not tested   Puree Puree: Within functional limits   Solid   GO    Solid: Impaired Oral Phase Impairments: Reduced lingual movement/coordination Oral Phase Functional Implications:  (mild prolonged oral prep)       Krista [name].Ed [employer] [phone]  02/18/2013

## 2013-02-18 NOTE — Progress Notes (Signed)
Utilization Review Completed.   Landra Howze, RN, BSN Nurse Case Manager  [phone]  

## 2013-02-19 DIAGNOSIS — I10 Essential (primary) hypertension: Secondary | ICD-10-CM

## 2013-02-19 DIAGNOSIS — E876 Hypokalemia: Secondary | ICD-10-CM

## 2013-02-19 DIAGNOSIS — R7989 Other specified abnormal findings of blood chemistry: Secondary | ICD-10-CM

## 2013-02-19 LAB — BASIC METABOLIC PANEL
CO2: 21 mEq/L (ref 19–32)
Calcium: 8.1 mg/dL — ABNORMAL LOW (ref 8.4–10.5)
GFR calc non Af Amer: >90 mL/min (ref >90)
Potassium: 2.4 mEq/L — CL (ref 3.5–5.1)
Sodium: 135 mEq/L (ref 135–145)

## 2013-02-19 LAB — GLUCOSE, CAPILLARY
Glucose-Capillary: 89 mg/dL (ref 70–99)
Glucose-Capillary: 152 mg/dL — ABNORMAL HIGH (ref 70–99)

## 2013-02-19 LAB — CBC
Hemoglobin: 10.9 g/dL — ABNORMAL LOW (ref 12.0–15.0)
MCV: 84.5 fL (ref 78.0–100.0)
Platelets: 157 10*3/uL (ref 150–400)
RBC: 3.68 MIL/uL — ABNORMAL LOW (ref 3.87–5.11)
WBC: 3.3 10*3/uL — ABNORMAL LOW (ref 4.0–10.5)

## 2013-02-19 LAB — URINE CULTURE: Colony Count: >=100000

## 2013-02-19 MED ORDER — NIACIN ER (ANTIHYPERLIPIDEMIC) 750 MG PO TBCR: 750.0000 mg | EXTENDED_RELEASE_TABLET | Freq: Every day | ORAL | Status: DC

## 2013-02-19 MED ORDER — METFORMIN HCL 500 MG PO TABS: 500.0000 mg | ORAL_TABLET | Freq: Two times a day (BID) | ORAL | Status: DC

## 2013-02-19 MED ORDER — NIACIN ER 500 MG PO CPCR
750.0000 mg | ORAL_CAPSULE | Freq: Every day | ORAL | Status: DC
  Administered 2013-02-19 – 2013-02-21 (×3): 750 mg via ORAL
  Filled 2013-02-19 (×5): qty 1

## 2013-02-19 MED ORDER — POTASSIUM CHLORIDE CRYS ER 20 MEQ PO TBCR
40.0000 meq | EXTENDED_RELEASE_TABLET | Freq: Four times a day (QID) | ORAL | Status: AC
  Administered 2013-02-19 (×3): 40 meq via ORAL
  Filled 2013-02-19 (×3): qty 2

## 2013-02-19 NOTE — Progress Notes (Signed)
FMTS Attending Daily Note: Denny Levy MD [phone] pager office [phone] I have discussed this patient with the resident and reviewed the assessment and plan as documented above. I agree wit the resident's findings and plan. Her d/c will be complex; she was recently d/c from oour service and did not do well at home. Unclear what different we can do--psychiatry had cleared her from a competency standpoint but that does not necessarily mean she always has good judgement. We are going to ask psychiatry and maybe the ACT team if they have any other ideas about how to ensure adequate outpatient treatment of her mental illness as I suspect that is the major issue causing her re-hospitalization. I think her family is well meaning but do not have a very deep understanding of these issues.

## 2013-02-19 NOTE — Progress Notes (Signed)
CRITICAL VALUE ALERT  Critical value received: potassium=2.4  Date of notification:  02/19/13  Time of notification:  0655  Critical value read back:yes  Nurse who received alert: G. Juanito Gonyer  MD notified (1st page): Dr. Claiborne Billings  Time of first page:0700  MD notified (2nd page):  Time of second page:  Responding MD:  Dr. Claiborne Billings  Time MD responded:  [phone]

## 2013-02-19 NOTE — Clinical Social Work Note (Signed)
Weekend CSW met with patient at bedside. Patient presents with positive affect, but has difficulty recalling information at times. Patient confirming she lives at home with her mother and brother, Clide Cliff. States that Clide Cliff is there 24/7 with her. Patient states that she has fallen three times at home recently and Clide Cliff has had to help pick her up. Patient stating that she has had [facility] coming to her home at that this was set up by the hospital after previous admissions. Patient stating repeatedly she does not want to go to a skilled nursing facility upon discharge. Patient states that she gets shots at [facility] with Dr. Deatra James. Patient has to set up these appointments 3-4 days ahead of time and then calls a transportation service, [phone], to take her to appointment. Patient confirms that she has missed appointments recently due to hospitalizations. CSW contacted on-call nurse at [facility]. RN stating that weekday CSW will need to contact Olegario Messier, [phone] and he will determine whether patient needs ACT Team services or Transition Team services upon discharge. Weekday CSW can also assist in setting up next outpatient clinic appointment with Deatra James by calling [phone].  Ricke Hey, [place] [phone] (weekend)

## 2013-02-19 NOTE — Progress Notes (Signed)
PT Cancellation Note  Patient Details Name: Krista [name] MRN: [id number] DOB: 1956-05-07   Cancelled Treatment:    Reason Eval/Treat Not Completed: Fatigue/lethargy limiting ability to participate.  Reports she has been up all day and is too tired to work with PT.  Will return in am for evaluation.   Vena Austria 02/19/2013, 5:30 PM [phone]

## 2013-02-19 NOTE — Progress Notes (Signed)
OT Cancellation Note  Patient Details Name: Krista [name] MRN: [id number] DOB: 06-04-1956   Cancelled Treatment:    Reason Eval/Treat Not Completed: Other (comment) (Pt refused stating, "I want to sleep")  Shykeria Sakamoto A 02/19/2013, 4:23 PM

## 2013-02-19 NOTE — Progress Notes (Signed)
FMTS Daily Intern Progress Note  Overview: Krista [name] is a 57 y.o. female with schizophrenia, DM Type II, HTN, and 2 recent admissions for urosepsis and chest pain workup for ACS (negative) who presented 4/17 with dehydration to [facility] after 2 days of lethargy, not taking PO or medications Per family, pt did not go to her last psych appt to receive routine antipsychotic injection. Pt also had elevated POC troponin 0.1 with T wave inversions on EKG but no cardiac intervention initiated prior to transfer.   Subjective: Sitting up having coffee this morning, no complaints other than abdominal pain, no SOB. Tolerating PO. Given IM antipsychotic yesterday per RN. When asked about where she wants to be discharged and who cares for her, she stated she lives with mother and brother, brother does not care for her and "cussed her out" prior to admission, and she refuses SNF.   I have reviewed the patient's medications. No PRNs in 24 hours.  Objective Temp:  [97 F (36.1 C)-98.1 F (36.7 C)] 97 F (36.1 C) (04/19 0543) Pulse Rate:  [79-87] 79 (04/19 0543) Resp:  [18-20] 18 (04/19 0543) BP: (121-124)/(78-84) 124/78 mmHg (04/19 0543) SpO2:  [96 %-100 %] 100 % (04/19 0543) Weight:  [151 lb 14.4 oz (68.9 kg)] 151 lb 14.4 oz (68.9 kg) (04/19 0543)   Intake/Output Summary (Last 24 hours) at 02/19/13 0707 Last data filed at 02/19/13 0539  Gross per 24 hour  Intake    360 ml  Output    454 ml  Net    -94 ml    CBG (last 3)   Recent Labs  02/18/13 1625 02/18/13 2052 02/19/13 0616  GLUCAP 140* 111* 89    General: NAD, sitting up in chair, pleasant HEENT: EOMI, sclera clear, o/p clear, MMM, very poor dentition with only a few teeth CV: RRR with no murmurs, rubs, gallops Pulm: CTAB posteriorly, but with wheezes on expiration, no increased work of breathing Abd: Soft, nontender, nondistended, no organomegaly Ext: No LE edema; legs are cachectic-appearing. Strength 5/5 UE and LE  bilaterally Neuro: Awake, alert, responds appropriately to questioning, speech is somewhat difficult to understand with no teeth but not slurred, no focal deficits, EOMI, oriented to person, place, time (looked at calendar) and president Psych: Mood seems content, linear thought process, affect appropriate, speech non-pressured though somewhat slowed, does not seem to be responding to internal stimuli, not catatonic  Labs and Imaging  Recent Labs Lab 02/17/13 1929 02/18/13 0640 02/19/13 0505  WBC 5.7 4.1 3.3*  HGB 12.4 11.0* 10.9*  HCT 35.0* 31.2* 31.1*  PLT 214 178 157     Recent Labs Lab 02/17/13 1320 02/17/13 1929 02/18/13 0640 02/19/13 0505  NA 134*  --  140 135  K 3.4*  --  3.1* 2.4*  CL 94*  --  109 103  CO2 22  --  20 21  BUN 48*  --  33* 16  CREATININE 1.25* 0.97 0.87 0.71  GLUCOSE 169*  --  105* 91  CALCIUM 9.6  --  8.3* 8.1*      Recent Labs Lab 02/17/13 1929 02/18/13 0037 02/18/13 0640  TROPONINI <0.30 <0.30 <0.30  trop i POC at [facility] - 0.10 UA many bacteria, large bili, 15 ket, 30 protein, small leukocytes  Lactic acid venous - 2.32 Ethyl alcohol neg UTox neg  Dg Chest 2 View  02/17/2013 *RADIOLOGY REPORT* Clinical Data: Altered mental status CHEST - 2 VIEW Comparison: 01/30/2013 Findings: Cardiomediastinal silhouette is stable. No acute  infiltrate or pleural effusion. No pulmonary edema. Osteopenia and degenerative changes thoracic spine. Stable compression deformities thoracic spine. IMPRESSION: No active disease. No significant change. Original Report Authenticated By: Natasha Mead, M.D.  Ct Head Wo Contrast  02/17/2013 *RADIOLOGY REPORT* Clinical Data: Altered mental status CT HEAD WITHOUT CONTRAST Technique: Contiguous axial images were obtained from the base of the skull through the vertex without contrast. Comparison: CT 01/23/2013 Findings: Mild atrophy no acute infarct. Negative for hemorrhage or mass. No change from the prior study. Air-fluid levels  in the maxillary and sphenoid sinuses may reflect acute infection. IMPRESSION: No acute intracranial abnormality. Mild atrophy Sinusitis with air-fluid levels. Original Report Authenticated By: Janeece Riggers, M.D.   c difficile positive  Swallow study recs: Dysphagia 3 (Mechanical Soft);Thin liquid  Liquid Administration via: Cup;No straw  Medication Administration: Whole meds with puree  Supervision: Patient able to self feed;Full supervision/cueing for compensatory strategies  Compensations: Slow rate;Small sips/bites    Assessment and Plan  Krista [name] is a 57 y.o. year old female presenting with dehydration secondary to poor PO intake over several days likely related to uncontrolled mental illness.   # Dehydration - Likely 10-15% intravascular depletion given tachycardia w/ stable BP on admission; HR improved s/p 2L fluids NS, vitals currently stable. Appears much better hydrated this morning. C difficile infection may help explain some of dehydration, as pt was found sitting on couch and uncertain how much repleting fluids while possibly losing them via diarrhea. - Passed bedside RN swallow - start diet and d/c IV fluids - Official swallow study: Recommends dysphagia 3 diet, thin liquid via cup no straw, med admin whole meds with puree, pt able to self feed. Full supervision for compensatory strategy cueing, compensations - slow rate and small bites/sips  # c difficile enterocolitis - C diff positive today.  - Put pt on enteric precautions - 4/18 Started flagyl 500mg  TID PO x 10 days - consider florastor  # Hypokalemia - 2.4 today from 3.1 yesterday. Likely from GI losses and decreased intake. - Kdur x 3 PO today with normal renal function - F/u BMET tomorrow  # Schizophrenia - Patient supposed to be receiving fluphenazine 25 mg q 14 days, but uncertain of that is actually taking place; Regardless of whether patient is receiving med, her mental illness is clearly preventing  her from being able to care for herself as an outpatient as evidence by 3 admission within one month.   - Patient is poor historian and on phone call night of admission, family thought she had missed appointment for most recent fluphenazine shot. No signs of EPS. - Dosed fluphenazine shot 4/18 - Continue benztropine  - Ordered ACT team call as this could help patient greatly if not already set up - will discuss with psych social worker today  # ACS - Rule out, mild elevated trop x 1 in ED an T wave inversion in V2-V5 thought likely due to strain with dehydration for unknown number of days. - Troponin i neg x 3, stable T wave inversion on repeat EKG with mild LAD (lead placement error?) - F/u AM EKG  # Diabetes Mellitus, Type 2  - Hold Metformin, restart when witness improved Cr.  - Sliding scale insulin   # Hyperlipidemia - Unclear why patient not on statin  - hold home gemfibrozil and niacin until swallow eval passed   # Possible UTI - Patient treated with Rocephin 1 gm In ED given presence of leukocytes and bacteria in  urine; Given recent urosepsis with E. Coli and inability to illicit symptoms, that seems reasonable  - Cont 1 gm CTX daily (1st day 4/17)  - F/u Urine culture  # GERD - C diff positive - continue holding home prilosec - Treating with metronidazole 500mg  TID started 4/18 at 2pm x 10 days; consider florastor  FENGI - Diet dysphagia 3 with thin liquid via cup, whole meds with puree, self-feed with full supervision / cueing for compensatory strategies (slow rate, small sips/bites). PPX - SQ heparin DISPO - Pending stabilization of mental health and discharge planning to help pt care for self and get appropriate/needed Mental Health services CODE status: Full code   Simone Curia Pager: [phone] 02/19/2013, 7:07 AM

## 2013-02-20 DIAGNOSIS — E860 Dehydration: Secondary | ICD-10-CM

## 2013-02-20 DIAGNOSIS — E1149 Type 2 diabetes mellitus with other diabetic neurological complication: Secondary | ICD-10-CM

## 2013-02-20 DIAGNOSIS — K219 Gastro-esophageal reflux disease without esophagitis: Secondary | ICD-10-CM

## 2013-02-20 DIAGNOSIS — N179 Acute kidney failure, unspecified: Secondary | ICD-10-CM

## 2013-02-20 DIAGNOSIS — E1142 Type 2 diabetes mellitus with diabetic polyneuropathy: Secondary | ICD-10-CM

## 2013-02-20 DIAGNOSIS — F29 Unspecified psychosis not due to a substance or known physiological condition: Secondary | ICD-10-CM

## 2013-02-20 LAB — BASIC METABOLIC PANEL
BUN: 11 mg/dL (ref 6–23)
GFR calc Af Amer: >90 mL/min (ref >90)
GFR calc non Af Amer: >90 mL/min (ref >90)
Potassium: 3.5 mEq/L (ref 3.5–5.1)
Sodium: 134 mEq/L — ABNORMAL LOW (ref 135–145)

## 2013-02-20 LAB — GLUCOSE, CAPILLARY: Glucose-Capillary: 95 mg/dL (ref 70–99)

## 2013-02-20 MED ORDER — KETOROLAC TROMETHAMINE 30 MG/ML IJ SOLN
30.0000 mg | Freq: Four times a day (QID) | INTRAMUSCULAR | Status: DC | PRN
  Administered 2013-02-21: 30 mg via INTRAVENOUS
  Filled 2013-02-20 (×2): qty 1

## 2013-02-20 NOTE — Progress Notes (Signed)
OT Cancellation Note  Patient Details Name: Krista [name] MRN: [id number] DOB: [birth year]   Cancelled Treatment:    Reason Eval/Treat Not Completed: Other (comment) (Pt sleeping and unable to arrouse adequately to participate)  Boykin Reaper [phone] 02/20/2013, 1:20 PM

## 2013-02-20 NOTE — Progress Notes (Signed)
FMTS Attending Daily Note: Krista Curran MD [phone] pager office [phone] I  have seen and examined this patient, reviewed their chart. I have discussed this patient with the resident. I agree with the resident's findings, assessment and care plan. 

## 2013-02-20 NOTE — Progress Notes (Signed)
FMTS Daily  Progress Note  Overview: Krista [name] is a 57 y.o. female with schizophrenia, DM Type II, HTN, and 2 recent admissions for urosepsis and chest pain workup for ACS (negative) who presented 4/17 with dehydration to [facility] after 2 days of lethargy, not taking PO or medications Per family, pt did not go to her last psych appt to receive routine antipsychotic injection. Pt also had elevated POC troponin 0.1 with T wave inversions on EKG but no cardiac intervention initiated prior to transfer.   Subjective: no acute events overnight. Diarrhea has slowed down significantly. Patient denies abdominal pain.   Objective Temp:  [98 F (36.7 C)-98.5 F (36.9 C)] 98.5 F (36.9 C) (04/20 0600) Pulse Rate:  [78-87] 85 (04/20 0600) Resp:  [18-19] 18 (04/19 2024) BP: (116-122)/(78-82) 122/78 mmHg (04/20 0600) SpO2:  [98 %-100 %] 98 % (04/20 0600) Weight:  [149 lb 11.1 oz (67.9 kg)] 149 lb 11.1 oz (67.9 kg) (04/20 0600)   Intake/Output Summary (Last 24 hours) at 02/20/13 0848 Last data filed at 02/19/13 1700  Gross per 24 hour  Intake    720 ml  Output    300 ml  Net    420 ml    CBG (last 3)   Recent Labs  02/19/13 1102 02/19/13 1625 02/19/13 2102  GLUCAP 152* 120* 154*    BP 122/78  Pulse 85  Temp(Src) 98.5 F (36.9 C) (Oral)  Resp 18  Ht 5\' 5"  (1.651 m)  Wt 149 lb 11.1 oz (67.9 kg)  BMI 24.91 kg/m2  SpO2 98% General appearance: alert, cooperative and no distress Lungs: clear to auscultation bilaterally Heart: regular rate and rhythm, S1, S2 normal, no murmur, click, rub or gallop Abdomen: soft, non-tender; bowel sounds normal; no masses,  no organomegaly Extremities: extremities normal, atraumatic, no cyanosis or edema  Labs and Imaging  Recent Labs Lab 02/17/13 1929 02/18/13 0640 02/19/13 0505  WBC 5.7 4.1 3.3*  HGB 12.4 11.0* 10.9*  HCT 35.0* 31.2* 31.1*  PLT 214 178 157     Recent Labs Lab 02/18/13 0640 02/19/13 0505 02/20/13 0605  NA 140 135  134*  K 3.1* 2.4* 3.5  CL 109 103 104  CO2 20 21 21   BUN 33* 16 11  CREATININE 0.87 0.71 0.73  GLUCOSE 105* 91 92  CALCIUM 8.3* 8.1* 8.2*      Recent Labs Lab 02/17/13 1929 02/18/13 0037 02/18/13 0640  TROPONINI <0.30 <0.30 <0.30  trop i POC at [facility] - 0.10 UA many bacteria, large bili, 15 ket, 30 protein, small leukocytes  Lactic acid venous - 2.32 Ethyl alcohol neg UTox neg  Dg Chest 2 View  02/17/2013 *RADIOLOGY REPORT* Clinical Data: Altered mental status CHEST - 2 VIEW Comparison: 01/30/2013 Findings: Cardiomediastinal silhouette is stable. No acute infiltrate or pleural effusion. No pulmonary edema. Osteopenia and degenerative changes thoracic spine. Stable compression deformities thoracic spine. IMPRESSION: No active disease. No significant change. Original Report Authenticated By: Natasha Mead, M.D.  Ct Head Wo Contrast  02/17/2013 *RADIOLOGY REPORT* Clinical Data: Altered mental status CT HEAD WITHOUT CONTRAST Technique: Contiguous axial images were obtained from the base of the skull through the vertex without contrast. Comparison: CT 01/23/2013 Findings: Mild atrophy no acute infarct. Negative for hemorrhage or mass. No change from the prior study. Air-fluid levels in the maxillary and sphenoid sinuses may reflect acute infection. IMPRESSION: No acute intracranial abnormality. Mild atrophy Sinusitis with air-fluid levels. Original Report Authenticated By: Janeece Riggers, M.D.   c difficile positive  Swallow study recs: Dysphagia 3 (Mechanical Soft);Thin liquid  Liquid Administration via: Cup;No straw  Medication Administration: Whole meds with puree  Supervision: Patient able to self feed;Full supervision/cueing for compensatory strategies  Compensations: Slow rate;Small sips/bites    Assessment and Plan  RAILEIGH [name] is a 57 y.o. year old female presenting with dehydration secondary to poor PO intake over several days likely related to uncontrolled mental illness.   #  Dehydration  A: resolved with IVF fluids resuscitation. P: continue oral intake.   # c difficile enterocolitis  A: C diff positive. Frequency and volume of stool decreasing. Abdominal exam normal.  P:  Continue flagyl and enteric precautions.  - 4/18 Started flagyl 500mg  TID PO x 10 days  # Hypokalemia -  A: resolved. P: recheck BMP tomorrow AM.   # Schizophrenia - Patient supposed to be receiving fluphenazine 25 mg q 14 days, but uncertain of that is actually taking place; Regardless of whether patient is receiving med, her mental illness is clearly preventing her from being able to care for herself as an outpatient as evidence by 3 admission within one month.   - Patient is poor historian and on phone call night of admission, family thought she had missed appointment for most recent fluphenazine shot. No signs of EPS. - Dosed fluphenazine shot 4/18 - Continue benztropine  - Ordered ACT team call as this could help patient greatly if not already set up - will discuss with psych social worker today  # ACS - Rule out, mild elevated trop x 1 in ED an T wave inversion in V2-V5 thought likely due to strain with dehydration for unknown number of days. - Troponin i neg x 3, stable T wave inversion on repeat EKG with mild LAD (lead placement error?)   # Diabetes Mellitus, Type 2  - Hold Metformin, restart when witness improved Cr.  - Sliding scale insulin   # Hyperlipidemia -will determine outpatient therapy at discharge.    # Possible UTI - Patient treated with Rocephin 1 gm In ED given presence of leukocytes and bacteria in urine. Urine culture negative for single organism. P: will stop CTX.   # GERD - C diff positive - continue holding home prilosec - Treating with metronidazole 500mg  TID started 4/18 at 2pm x 10 days; consider florastor  FENGI - Diet dysphagia 3 with thin liquid via cup, whole meds with puree, self-feed with full supervision / cueing for compensatory strategies (slow  rate, small sips/bites).  PPX - SQ heparin  DISPO - Pending eval by Psych and ACT team. There is concern that patient will not be safe under self care at home given history of not drinking for 48 hrs.   CODE status: Full code  Dessa Phi Pager: [phone] 02/20/2013, 12:36 PM

## 2013-02-20 NOTE — Evaluation (Signed)
Physical Therapy Evaluation Patient Details Name: MECHELE KITTLESON MRN: [id number] DOB: [birth year] Today's Date: 02/20/2013 Time: 4098-1191 PT Time Calculation (min): 21 min  PT Assessment / Plan / Recommendation Clinical Impression  Patient is a 57 yo female admitted with dehydration and AMS with pmh of HTN, DM, and schizophrenia.  Patient with weakness and decreased balance impacting mobility.  Will benefit from acute PT to maximize independence prior to discharge home.  Will need 24 hour assist at discharge.  Recommend HHPT to continue therapy.    PT Assessment  Patient needs continued PT services    Follow Up Recommendations  Home health PT;Supervision/Assistance - 24 hour    Does the patient have the potential to tolerate intense rehabilitation      Barriers to Discharge        Equipment Recommendations  None recommended by PT    Recommendations for Other Services     Frequency Min 3X/week    Precautions / Restrictions Precautions Precautions: Fall Restrictions Weight Bearing Restrictions: No   Pertinent Vitals/Pain       Mobility  Bed Mobility Bed Mobility: Supine to Sit;Sitting - Scoot to Edge of Bed Supine to Sit: 4: Min assist;HOB flat Sitting - Scoot to [employer] of Bed: 4: Min guard Details for Bed Mobility Assistance: Verbal cues for technique.  Verbal cues for encouragement.  Assist due to initial lethargy.  Once sitting, became alert and conversant. Transfers Transfers: Sit to Stand;Stand to [employer] Transfers Sit to Stand: 4: Min guard;With upper extremity assist;From bed;With armrests;From chair/3-in-1 Stand to Sit: 4: Min guard;With upper extremity assist;With armrests;To chair/3-in-1 Stand Pivot Transfers: 4: Min assist Details for Transfer Assistance: Verbal cues for technique.  Assist for safety and balance.  Patient transferred bed to [facility], with ataxic movement of LE's.  Following ambulation, patient sat in chair with cues for hand  placement. Ambulation/Gait Ambulation/Gait Assistance: 4: Min assist Ambulation Distance (Feet): 22 Feet Assistive device: None Ambulation/Gait Assistance Details: Verbal cues to move more slowly initially - impulsive, moving before assistance provided.  Patient with ataxic movement of LE's impacting balance and safety. Gait Pattern: Step-through pattern;Decreased step length - right;Decreased step length - left;Ataxic    Exercises     PT Diagnosis: Difficulty walking;Abnormality of gait;Generalized weakness;Altered mental status  PT Problem List: Decreased strength;Decreased activity tolerance;Decreased balance;Decreased mobility;Decreased cognition;Decreased knowledge of use of DME;Decreased safety awareness PT Treatment Interventions: DME instruction;Gait training;Functional mobility training;Patient/family education;Therapeutic exercise   PT Goals Acute Rehab PT Goals PT Goal Formulation: With patient Time For Goal Achievement: 02/27/13 Potential to Achieve Goals: Fair Pt will go Supine/Side to Sit: Independently;with HOB 0 degrees PT Goal: Supine/Side to Sit - Progress: Goal set today Pt will go Sit to Supine/Side: Independently;with HOB 0 degrees PT Goal: Sit to Supine/Side - Progress: Goal set today Pt will go Sit to Stand: with supervision;with upper extremity assist PT Goal: Sit to Stand - Progress: Goal set today Pt will Ambulate: 51 - 150 feet;with supervision;with rolling walker PT Goal: Ambulate - Progress: Goal set today  Visit Information  Last PT Received On: 02/20/13 Assistance Needed: +1    Subjective Data  Subjective: "I don't feel like it (walking)"  Agreed to get OOB with encouragement Patient Stated Goal: None stated   Prior Functioning  Home Living Lives With: Family (Mother and brother) Available Help at Discharge: Family;Available 24 hours/day Type of Home: House Home Access: Stairs to enter [employer] of Steps: 5 Entrance Stairs-Rails:  Right;Left Home Layout: One level Home  Adaptive Equipment: Walker - rolling Prior Function Level of Independence: Independent with assistive device(s);Needs assistance Needs Assistance: Meal Prep;Light Housekeeping Meal Prep: Maximal Light Housekeeping: Maximal Able to Take Stairs?: Yes Driving: No Vocation: On disability Communication Communication: No difficulties Dominant Hand: Right    Cognition  Cognition Arousal/Alertness: Awake/alert (Initially difficult to arouse) Behavior During Therapy: Impulsive (Restless) Overall Cognitive Status: No family/caregiver present to determine baseline cognitive functioning    Extremity/Trunk Assessment Right Upper Extremity Assessment RUE ROM/Strength/Tone: WFL for tasks assessed Left Upper Extremity Assessment LUE ROM/Strength/Tone: WFL for tasks assessed Right Lower Extremity Assessment RLE ROM/Strength/Tone: Deficits RLE ROM/Strength/Tone Deficits: Strength 4/5 with ataxic movements Left Lower Extremity Assessment LLE ROM/Strength/Tone: Deficits LLE ROM/Strength/Tone Deficits: Strength 4/5 with ataxic movements Trunk Assessment Trunk Assessment: Kyphotic   Balance Balance Balance Assessed: Yes Static Sitting Balance Static Sitting - Balance Support: Feet supported;No upper extremity supported Static Sitting - Level of Assistance: 5: Stand by assistance  End of Session PT - End of Session Equipment Utilized During Treatment: Gait belt Activity Tolerance: Patient limited by fatigue Patient left: in chair;with call bell/phone within reach Nurse Communication: Mobility status  GP     Vena Austria 02/20/2013, 4:12 PM Durenda Hurt. Renaldo Fiddler, [facility] Acute Rehab Services Pager [phone]

## 2013-02-21 DIAGNOSIS — A0472 Enterocolitis due to Clostridium difficile, not specified as recurrent: Principal | ICD-10-CM

## 2013-02-21 LAB — GLUCOSE, CAPILLARY: Glucose-Capillary: 111 mg/dL — ABNORMAL HIGH (ref 70–99)

## 2013-02-21 LAB — BASIC METABOLIC PANEL
CO2: 20 mEq/L (ref 19–32)
Calcium: 8.3 mg/dL — ABNORMAL LOW (ref 8.4–10.5)
Chloride: 97 mEq/L (ref 96–112)
Glucose, Bld: 113 mg/dL — ABNORMAL HIGH (ref 70–99)
Sodium: 128 mEq/L — ABNORMAL LOW (ref 135–145)

## 2013-02-21 MED ORDER — METFORMIN HCL 500 MG PO TABS
1000.0000 mg | ORAL_TABLET | Freq: Every day | ORAL | Status: DC
  Administered 2013-02-22: 1000 mg via ORAL
  Filled 2013-02-21 (×2): qty 2

## 2013-02-21 MED ORDER — CALCIUM CARBONATE ANTACID 500 MG PO CHEW: 2.0000 | CHEWABLE_TABLET | Freq: Three times a day (TID) | ORAL | Status: DC

## 2013-02-21 MED ORDER — CALCIUM CARBONATE ANTACID 500 MG PO CHEW
1.0000 | CHEWABLE_TABLET | Freq: Three times a day (TID) | ORAL | Status: DC
  Administered 2013-02-21 – 2013-02-22 (×4): 200 mg via ORAL
  Filled 2013-02-21 (×5): qty 1

## 2013-02-21 MED ORDER — SIMVASTATIN 20 MG PO TABS
20.0000 mg | ORAL_TABLET | Freq: Every day | ORAL | Status: DC
  Administered 2013-02-21: 20 mg via ORAL
  Filled 2013-02-21 (×3): qty 1

## 2013-02-21 MED ORDER — METFORMIN HCL 500 MG PO TABS
500.0000 mg | ORAL_TABLET | Freq: Every day | ORAL | Status: DC
  Administered 2013-02-21: 500 mg via ORAL
  Filled 2013-02-21 (×2): qty 1

## 2013-02-21 MED ORDER — POTASSIUM CHLORIDE CRYS ER 20 MEQ PO TBCR
40.0000 meq | EXTENDED_RELEASE_TABLET | Freq: Once | ORAL | Status: AC
  Administered 2013-02-21: 40 meq via ORAL
  Filled 2013-02-21: qty 2

## 2013-02-21 NOTE — Clinical Documentation Improvement (Signed)
RENAL FAILURE DOCUMENTATION CLARIFICATION QUERY  CLINICAL DOCUMENTATION QUERIES ARE NOT PART OF THE PERMANENT MEDICAL RECORD   Please update your documentation within the medical record to reflect your response to this query.                                                                                     02/21/13  Dr. Benjamin Stain and/or Associates,  In a better effort to capture your patient's severity of illness, reflect appropriate length of stay and utilization of resources, a review of the patient medical record has revealed the following indicators:   - Admitted with Dehydration and C-diff Colitis   - Known history of Sepsis with UTI and Acute Kidney Injury   - Admission BUN/Cr/GFR   (white female)    48/1.25/47   - Patient received IV Fluids and Repeat Chemistries   - Repeat BUN/Cr/GFR  02/18/13   (white female)    33/0.87/73    Based on your clinical judgment, please document in the progress notes and discharge summary if a condition below provides greater specificity regarding the patient's renal function this admission:   - Acute Kidney Injury, resolved   - Other Condition   - Unable to Clinically Determine     In responding to this query please exercise your independent judgment.    The fact that a query is asked, does not imply that any particular answer is desired or expected.   Reviewed: additional documentation in the medical record starting with tomorrow's note  Thank You,  Jerral Ralph  RN BSN CCDS Certified Clinical Documentation Specialist: Cell   [phone]  Health Information Management [place]   TO RESPOND TO THE THIS QUERY, FOLLOW THE INSTRUCTIONS BELOW:  1. If needed, update documentation for the patient's encounter via the notes activity.  2. Access this query again and click edit on the In [employer].  3. After updating, or not, click F2 to complete all highlighted (required) fields concerning your review. Select  "additional documentation in the medical record" OR "no additional documentation provided".  4. Click Sign note button.  5. The deficiency will fall out of your In Basket *Please let us know if you are not able to complete this workflow by phone or e-mail (listed below).

## 2013-02-21 NOTE — Progress Notes (Signed)
I have seen and examined this patient. I have discussed with Dr Thekkekandam.  I agree with their findings and plans as documented in their progress note.    

## 2013-02-21 NOTE — Progress Notes (Signed)
Speech Language Pathology Dysphagia Treatment Patient Details Name: Krista [name] MRN: [id number] DOB: 1956-06-29 Today's Date: 02/21/2013 Time: 4098-1191 SLP Time Calculation (min): 8 min  Assessment / Plan / Recommendation Clinical Impression  Follow up today to further ensure safety with thin liquids.  RN says pt. has been steadily improving.  Pt. appears more aware today and less impulsive.  Observed with cup sips and straw sips thin (possible recommendation of straw) without s/s aspiration.  She only needed min verbal reminders to continue with small sips.  Recommend she continue thin and may drink via straw if she desires.  Will keep on Dys 3 at this time due to dental status and recommend upgrade at next venue of care.  ST will sign off.    Diet Recommendation  Continue with Current Diet: Dysphagia 3 (mechanical soft);Thin liquid    SLP Plan All goals met   Pertinent Vitals/Pain none   Swallowing Goals  SLP Swallowing Goals Patient will consume recommended diet without observed clinical signs of aspiration with: Maximal cueing Swallow Study Goal #1 - Progress: Met Patient will utilize recommended strategies during swallow to increase swallowing safety with: Maximal cueing Swallow Study Goal #2 - Progress: Met  General Temperature Spikes Noted: No Respiratory Status: Room air Behavior/Cognition: Alert;Cooperative;Pleasant mood;Requires cueing Oral Cavity - Dentition: Poor condition;Missing dentition Patient Positioning: Upright in bed  Oral Cavity - Oral Hygiene Does patient have any of the following "at risk" factors?: None of the above Brush patient's teeth BID with toothbrush (using toothpaste with fluoride): Yes Patient is AT RISK - Oral Care Protocol followed (see row info): Yes   Dysphagia Treatment Treatment focused on: Skilled observation of diet tolerance;Utilization of compensatory strategies Treatment Methods/Modalities: Skilled observation Patient observed  directly with PO's: Yes Type of PO's observed: Thin liquids Feeding: Able to feed self Liquids provided via: Cup;Straw Type of cueing: Verbal Amount of cueing: Minimal   GO     Royce Macadamia M.Ed [employer] [phone]  02/21/2013

## 2013-02-21 NOTE — Progress Notes (Signed)
Occupational Therapy Evaluation Patient Details Name: Krista [name] MRN: [id number] DOB: 13-Jul-1956 Today's Date: 02/21/2013 Time: 7829-5621 OT Time Calculation (min): 39 min  OT Assessment / Plan / Recommendation Clinical Impression  57 yo with hx of schitzophrenia admitted with AMS. Pt functioning @ overall S level for ADL and mobility. Pt would benefit from 3 in 1 to reduce risk of falls at home. No further OT indicated atthis time.    OT Assessment  Patient does not need any further OT services    Follow Up Recommendations  No OT follow up    Barriers to Discharge None    Equipment Recommendations  3 in 1 bedside comode    Recommendations for Other Services    Frequency   (eval only)    Precautions / Restrictions Precautions Precautions: Fall   Pertinent Vitals/Pain no apparent distress     ADL  Eating/Feeding: Other (comment) (modified diet) Grooming: Supervision/safety Where Assessed - Grooming: Unsupported standing Upper Body Bathing: Set up Where Assessed - Upper Body Bathing: Unsupported standing Lower Body Bathing: Supervision/safety;Set up Where Assessed - Lower Body Bathing: Unsupported sit to stand Upper Body Dressing: Supervision/safety;Set up Where Assessed - Upper Body Dressing: Unsupported standing Lower Body Dressing: Supervision/safety;Set up Where Assessed - Lower Body Dressing: Unsupported sit to stand Toilet Transfer: Supervision/safety Toilet Transfer Method: Sit to Barista: Materials engineer and Hygiene: Modified independent Where Assessed - Engineer, mining and Hygiene: Sit to stand from 3-in-1 or toilet Equipment Used: Gait belt Transfers/Ambulation Related to ADLs: S ADL Comments: safe at this time    OT Diagnosis: Altered mental status  OT Problem List:   OT Treatment Interventions: Self-care/ADL training   OT Goals    Visit Information  Last OT Received On:  02/21/13 Assistance Needed: +1    Subjective Data      Prior Functioning     Home Living Lives With: Family Available Help at Discharge: Family;Available 24 hours/day Type of Home: House Home Access: Stairs to enter [employer] of Steps: 5 Entrance Stairs-Rails: Right;Left Home Layout: One level Bathroom Shower/Tub: Engineer, manufacturing systems: Standard Bathroom Accessibility: Yes How Accessible: Accessible via walker Home Adaptive Equipment: Shower chair with back;Walker - rolling Prior Function Level of Independence: Independent;Independent with assistive device(s) Needs Assistance: Meal Prep;Light Housekeeping Meal Prep: Maximal Light Housekeeping: Maximal Able to Take Stairs?: Yes Driving: No Comments: uses walker when needed         Vision/Perception     Cognition  Cognition Arousal/Alertness: Awake/alert Behavior During Therapy: WFL for tasks assessed/performed Overall Cognitive Status: History of cognitive impairments - at baseline    Extremity/Trunk Assessment Right Upper Extremity Assessment RUE ROM/Strength/Tone: [facility] for tasks assessed Left Upper Extremity Assessment LUE ROM/Strength/Tone: WFL for tasks assessed Right Lower Extremity Assessment RLE ROM/Strength/Tone: [facility] for tasks assessed Left Lower Extremity Assessment LLE ROM/Strength/Tone: WFL for tasks assessed Trunk Assessment Trunk Assessment: Kyphotic     Mobility Bed Mobility Bed Mobility: Supine to Sit Rolling Right: 6: Modified independent (Device/Increase time) Transfers Transfers: Sit to Stand;Stand to Sit Sit to Stand: 5: Supervision;From bed Stand to Sit: 5: Supervision;To chair/3-in-1     Exercise     Balance Balance Balance Assessed:  (WFL for ADL)   End of Session OT - End of Session Equipment Utilized During Treatment: Gait belt Activity Tolerance: Patient tolerated treatment well Patient left: in chair;with call bell/phone within reach Nurse  Communication: Mobility status  GO     Koa Palla,HILLARY 02/21/2013,  12:01 PM [facility], OTR/L  [phone] 02/21/2013

## 2013-02-21 NOTE — Progress Notes (Signed)
FMTS Daily  Progress Note  Overview: Krista [name] is a 57 y.o. female with schizophrenia, DM Type II, HTN, and 2 recent admissions for urosepsis and chest pain workup for ACS (negative) who presented 4/17 with dehydration to [facility] after 2 days of lethargy, not taking PO or medications Per family, pt did not go to her last psych appt to receive routine antipsychotic injection. Pt also had elevated POC troponin 0.1 with T wave inversions on EKG but no cardiac intervention initiated prior to transfer.   Subjective: Pt this morning asks to go home; reports some abdominal pain this AM with emesis, otherwise tolerating PO.  I have reviewed the patient's medications. PRN toradol 335AM.  Objective Temp:  [97.8 F (36.6 C)-98.1 F (36.7 C)] 97.9 F (36.6 C) (04/21 0448) Pulse Rate:  [78-86] 86 (04/21 0628) Resp:  [18-19] 18 (04/21 0448) BP: (94-123)/(69-91) 122/88 mmHg (04/21 0628) SpO2:  [95 %-100 %] 95 % (04/21 0448) Weight:  [151 lb 4.8 oz (68.629 kg)] 151 lb 4.8 oz (68.629 kg) (04/21 0448)   Intake/Output Summary (Last 24 hours) at 02/21/13 0705 Last data filed at 02/21/13 0600  Gross per 24 hour  Intake    780 ml  Output    625 ml  Net    155 ml    CBG (last 3)   Recent Labs  02/20/13 1603 02/20/13 2108 02/21/13 0634  GLUCAP 144* 124* 111*    BP 122/88  Pulse 86  Temp(Src) 97.9 F (36.6 C) (Oral)  Resp 18  Ht 5\' 5"  (1.651 m)  Wt 151 lb 4.8 oz (68.629 kg)  BMI 25.18 kg/m2  SpO2 95% General appearance: alert, cooperative and no distress Lungs: clear to auscultation bilaterally with faint expiratory wheeze anteriorly right lung Heart: regular rate and rhythm, S1, S2 normal, no murmur, click, rub or gallop; 2+ radial pulse Abdomen: Soft, generalized moderate tenderness to palpation, nondistended, NABS Extremities: extremities normal, atraumatic, no cyanosis or edema  Labs and Imaging  Recent Labs Lab 02/17/13 1929 02/18/13 0640 02/19/13 0505  WBC 5.7 4.1 3.3*   HGB 12.4 11.0* 10.9*  HCT 35.0* 31.2* 31.1*  PLT 214 178 157     Recent Labs Lab 02/18/13 0640 02/19/13 0505 02/20/13 0605  NA 140 135 134*  K 3.1* 2.4* 3.5  CL 109 103 104  CO2 20 21 21   BUN 33* 16 11  CREATININE 0.87 0.71 0.73  GLUCOSE 105* 91 92  CALCIUM 8.3* 8.1* 8.2*      Recent Labs Lab 02/17/13 1929 02/18/13 0037 02/18/13 0640  TROPONINI <0.30 <0.30 <0.30  trop i POC at [facility] - 0.10    Recent Labs Lab 02/20/13 0558 02/20/13 1129 02/20/13 1603 02/20/13 2108 02/21/13 0634  GLUCAP 95 129* 144* 124* 111*   UA many bacteria, large bili, 15 ket, 30 protein, small leukocytes  Lactic acid venous - 2.32 Ethyl alcohol neg UTox neg  Dg Chest 2 View  02/17/2013 *RADIOLOGY REPORT* Clinical Data: Altered mental status CHEST - 2 VIEW Comparison: 01/30/2013 Findings: Cardiomediastinal silhouette is stable. No acute infiltrate or pleural effusion. No pulmonary edema. Osteopenia and degenerative changes thoracic spine. Stable compression deformities thoracic spine. IMPRESSION: No active disease. No significant change. Original Report Authenticated By: Natasha Mead, M.D.   Ct Head Wo Contrast  02/17/2013 *RADIOLOGY REPORT* Clinical Data: Altered mental status CT HEAD WITHOUT CONTRAST Technique: Contiguous axial images were obtained from the base of the skull through the vertex without contrast. Comparison: CT 01/23/2013 Findings: Mild atrophy no  acute infarct. Negative for hemorrhage or mass. No change from the prior study. Air-fluid levels in the maxillary and sphenoid sinuses may reflect acute infection. IMPRESSION: No acute intracranial abnormality. Mild atrophy Sinusitis with air-fluid levels. Original Report Authenticated By: Janeece Riggers, M.D.   c difficile positive  Swallow study recs: Dysphagia 3 (Mechanical Soft);Thin liquid  Liquid Administration via: Cup;No straw  Medication Administration: Whole meds with puree  Supervision: Patient able to self feed;Full  supervision/cueing for compensatory strategies  Compensations: Slow rate;Small sips/bites    Assessment and Plan  - Krista [name] is a 57 y.o. year old female presenting with dehydration secondary to poor PO intake over several days likely related to uncontrolled mental illness.   # Dehydration - Pt's family was concerned that she had just been sitting on couch x 2 days not eating, not drinking, and possibly not taking meds. A: resolved with IVF fluids resuscitation. P: continue oral intake.   # c difficile enterocolitis  A: C diff positive. Frequency and volume of stool decreasing. Abdominal exam normal.  P: Continue flagyl and enteric precautions.  - 4/18 Started flagyl 500mg  TID PO x 10 days  # Hypokalemia -  A: resolved yesterday to 3.5 but back down to 3.3 today P: Will dose K-dur x 1 and recheck BMP tomorrow AM.   # Schizophrenia - Patient supposed to be receiving fluphenazine 25 mg q 14 days, but uncertain of that is actually taking place; Regardless of whether patient is receiving med, her mental illness is clearly preventing her from being able to care for herself as an outpatient as evidence by 3 admission within one month.   - Patient is poor historian and on phone call night of admission, family thought she had missed appointment for most recent fluphenazine shot. No signs of EPS. - Dosed fluphenazine shot 4/18; per pt, gets shots at [facility] with Dr. Deatra James. - Continue benztropine  - Ordered ACT team call as this could help patient greatly if not already set up - will discuss with psych social worker to help ensure the best possible MH resources outpatient.  - Pt sets up appts at [facility] ahead of time and calls transportation services; reports recent missed appts due to hospitalizations  - Per [place], our weekday CSW needs to contact Olegario Messier to decide ACT Team vs Transition Team srvcs upon d/c (see 4/19 CSW  note) and can help set up next outpatient clinic appt.  - Per CM, need to order psych c/s. Has had psych see her in past, unsure if she has suppt via ACT or transition team. Pls help/advise.   # ACS - Rule out, mild elevated trop x 1 in ED an T wave inversion in V2-V5 thought likely due to strain with dehydration for unknown number of days. - Troponin i neg x 3, stable T wave inversion on repeat EKG with mild LAD (lead placement error?)  - Pt had previously refused cardiac stress test; will re-address  # Diabetes Mellitus, Type 2  - Hold Metformin, restart when Cr stable  - Sliding scale insulin   # Hyperlipidemia -will determine outpatient therapy at discharge.    # Possible UTI - Patient treated with Rocephin 1 gm In ED given presence of leukocytes and bacteria in urine. Urine culture negative for single organism. P: will stop CTX.   # GERD - C diff positive - continue holding home prilosec - Treating with metronidazole 500mg  TID started 4/18 at  2pm x 10 days; consider florastor  FENGI - Diet dysphagia 3 with thin liquid via cup, whole meds with puree, self-feed with full supervision / cueing for compensatory strategies (slow rate, small sips/bites).  PPX - SQ heparin  DISPO - Pending eval by Psych and ACT team. There is concern that patient will not be safe under self care at home given history of not drinking for 48 hrs. Pt refused OT x 2. PT recommends HHPT with 24 hours supervision  CODE status: Full code  Simone Curia Pager: [phone] 02/21/2013, 7:05 AM

## 2013-02-21 NOTE — Progress Notes (Signed)
Patient seen by Ricke Hey, Weekend CSW.  Plan d/c home when medically stable.  Awaiting psych evaluation and patient discussed with Vickii Penna, Psych CSW.Marland Kitchen  Per note by Ms Derrell Lolling, the issue of patient's poor  living situation as report bye EMS  (stated home was filthy)- patient's sister has since cleaned up the home site.  Will await recommendation by Psych team and follow up appropriatly.  Lorri Frederick. [place]  [phone]

## 2013-02-21 NOTE — Progress Notes (Signed)
Physical Therapy Treatment Patient Details Name: Krista [name] MRN: [id number] DOB: May 16, 1956 Today's Date: 02/21/2013 Time: 0150-0213 PT Time Calculation (min): 23 min  PT Assessment / Plan / Recommendation Comments on Treatment Session  Pt progressing with mobility + PT goals at this date.       Follow Up Recommendations  Home health PT;Supervision/Assistance - 24 hour     Does the patient have the potential to tolerate intense rehabilitation     Barriers to Discharge        Equipment Recommendations  None recommended by PT    Recommendations for Other Services    Frequency Min 3X/week   Plan Discharge plan needs to be updated;Frequency remains appropriate    Precautions / Restrictions Precautions Precautions: Fall Restrictions Weight Bearing Restrictions: No   Pertinent Vitals/Pain C/o back pain.  RN is aware.      Mobility  Bed Mobility Bed Mobility: Rolling Right;Right Sidelying to Sit;Sitting - Scoot to Edge of Bed Rolling Right: 4: Min assist Right Sidelying to Sit: 4: Min assist Details for Bed Mobility Assistance: Cues for technique (logrolling) to decrease discomfort on back.  (A) for LE's.   Transfers Transfers: Sit to Stand;Stand to Sit Sit to Stand: 5: Supervision;With upper extremity assist;From bed;From chair/3-in-1;With armrests Stand to Sit: 5: Supervision;With upper extremity assist;With armrests;To chair/3-in-1 Details for Transfer Assistance: cues for hand placement Ambulation/Gait Ambulation/Gait Assistance: 4: Min guard Ambulation Distance (Feet): 120 Feet Assistive device: None Ambulation/Gait Assistance Details: Guarding for safety.   No ataxic movements noted of LE's today Gait Pattern: Step-through pattern;Decreased stride length (decreased step height) Stairs: No Wheelchair Mobility Wheelchair Mobility: No    Exercises General Exercises - Lower Extremity Long Arc Quad: AROM;Strengthening;10 reps;Both;Seated Hip Flexion/Marching:  AROM;Both;10 reps;Seated Toe Raises: Both;10 reps;Seated Heel Raises: Both;10 reps;Seated Other Exercises Other Exercises: sit<>stand x 6 reps for strengthening & activity tolerance     PT Goals Acute Rehab PT Goals Time For Goal Achievement: 02/27/13 Potential to Achieve Goals: Fair Pt will go Supine/Side to Sit: Independently;with HOB 0 degrees PT Goal: Supine/Side to Sit - Progress: Not met Pt will go Sit to Supine/Side: Independently;with HOB 0 degrees Pt will go Sit to Stand: with supervision;with upper extremity assist PT Goal: Sit to Stand - Progress: Met Pt will go Stand to Sit: with supervision PT Goal: Stand to Sit - Progress: Met Pt will Transfer Bed to Chair/Chair to Bed: with supervision Pt will Ambulate: 51 - 150 feet;with supervision;with rolling walker PT Goal: Ambulate - Progress: Progressing toward goal Pt will Go Up / Down Stairs: 3-5 stairs;with supervision;with rail(s)  Visit Information  Last PT Received On: 02/21/13 Assistance Needed: +1    Subjective Data      Cognition  Cognition Arousal/Alertness: Awake/alert Behavior During Therapy: WFL for tasks assessed/performed Overall Cognitive Status: History of cognitive impairments - at baseline    Balance  Balance Balance Assessed:  (WFL for ADL) Static Standing Balance Static Standing - Balance Support: No upper extremity supported Static Standing - Level of Assistance: 6: Modified independent (Device/Increase time)  End of Session PT - End of Session Equipment Utilized During Treatment: Gait belt Activity Tolerance: Patient tolerated treatment well Patient left: in chair;with call bell/phone within reach Nurse Communication: Mobility status     Verdell Face, [place] [phone] 02/21/2013

## 2013-02-21 NOTE — Progress Notes (Signed)
CSW spoke with Dr. Benjamin Stain this afternoon to discuss patient.  Will await Psych consult tomorrow to determine further recommendations and then CSW will make appropriate follow up appointments. Plan d/c tomorrow per MD.  Lupita Leash T. [place] Pugh  [phone]

## 2013-02-21 NOTE — Progress Notes (Addendum)
FMTS Daily  Progress Note  Overview: KAMIKA GOODLOE is a 57 y.o. female with schizophrenia, DM Type II, HTN, and 2 recent admissions for urosepsis and chest pain workup for ACS (negative) who presented 4/17 with dehydration to [facility] after 2 days of lethargy, not taking PO or medications Per family, pt did not go to her last psych appt to receive routine antipsychotic injection. Pt also had elevated POC troponin 0.1 with T wave inversions on EKG but no cardiac intervention initiated prior to transfer.   Subjective: Patient reports some mild abdominal pain but wants to go home and is tolerating PO liquids and some solids. Is able to ambulate to bathroom, denies dysuria, chest pain, or shortness of breath.   I have reviewed the patient's medications. No PRN over 24 hours.  Objective Temp:  [97.4 F (36.3 C)-98 F (36.7 C)] 97.4 F (36.3 C) (04/21 1951) Pulse Rate:  [76-86] 77 (04/21 1951) Resp:  [16-18] 18 (04/21 1951) BP: (118-137)/(82-89) 118/82 mmHg (04/21 1951) SpO2:  [98 %-99 %] 98 % (04/21 1951) Weight:  [150 lb 5.7 oz (68.2 kg)] 150 lb 5.7 oz (68.2 kg) (04/22 0409)   Intake/Output Summary (Last 24 hours) at 02/22/13 0729 Last data filed at 02/22/13 0533  Gross per 24 hour  Intake   1280 ml  Output   1797 ml  Net   -517 ml    CBG (last 3)   Recent Labs  02/21/13 1650 02/21/13 2105 02/22/13 0621  GLUCAP 140* 147* 118*    BP 118/82  Pulse 77  Temp(Src) 97.4 F (36.3 C) (Oral)  Resp 18  Ht 5\' 5"  (1.651 m)  Wt 150 lb 5.7 oz (68.2 kg)  BMI 25.02 kg/m2  SpO2 98% General appearance: alert, cooperative and no distress Lungs: clear to auscultation bilaterally with coarse breath sounds bilaterally Heart: regular rate and rhythm, S1, S2 normal, no murmur, click, rub or gallop; 2+ radial pulse Abdomen: Soft, generalized moderate tenderness, nondistended, hypoactive bowel sounds Extremities: extremities normal, atraumatic, no cyanosis or edema Neuro: Awake, alert, oriented  to person, place, time (except day that it's Tuesday), no focal deficit, occasional tongue protrusion but no rhythmic tremor or movement. Normal speech (for someone with very few teeth). Heent: Very poor dentition missing most teeth, MMM  Labs and Imaging  Recent Labs Lab 02/17/13 1929 02/18/13 0640 02/19/13 0505  WBC 5.7 4.1 3.3*  HGB 12.4 11.0* 10.9*  HCT 35.0* 31.2* 31.1*  PLT 214 178 157     Recent Labs Lab 02/20/13 0605 02/21/13 0715 02/22/13 0515  NA 134* 128* 133*  K 3.5 3.3* 3.4*  CL 104 97 99  CO2 21 20 25   BUN 11 9 5*  CREATININE 0.73 0.67 0.69  GLUCOSE 92 113* 118*  CALCIUM 8.2* 8.3* 8.4      Recent Labs Lab 02/17/13 1929 02/18/13 0037 02/18/13 0640  TROPONINI <0.30 <0.30 <0.30  trop i POC at [facility] - 0.10    Recent Labs Lab 02/21/13 0634 02/21/13 1126 02/21/13 1650 02/21/13 2105 02/22/13 0621  GLUCAP 111* 140* 140* 147* 118*   UA many bacteria, large bili, 15 ket, 30 protein, small leukocytes  Lactic acid venous - 2.32 Ethyl alcohol neg UTox neg  Dg Chest 2 View  02/17/2013 *RADIOLOGY REPORT* Clinical Data: Altered mental status CHEST - 2 VIEW Comparison: 01/30/2013 Findings: Cardiomediastinal silhouette is stable. No acute infiltrate or pleural effusion. No pulmonary edema. Osteopenia and degenerative changes thoracic spine. Stable compression deformities thoracic spine. IMPRESSION: No active  disease. No significant change. Original Report Authenticated By: Natasha Mead, M.D.   Ct Head Wo Contrast  02/17/2013 *RADIOLOGY REPORT* Clinical Data: Altered mental status CT HEAD WITHOUT CONTRAST Technique: Contiguous axial images were obtained from the base of the skull through the vertex without contrast. Comparison: CT 01/23/2013 Findings: Mild atrophy no acute infarct. Negative for hemorrhage or mass. No change from the prior study. Air-fluid levels in the maxillary and sphenoid sinuses may reflect acute infection. IMPRESSION: No acute intracranial  abnormality. Mild atrophy Sinusitis with air-fluid levels. Original Report Authenticated By: Janeece Riggers, M.D.   c difficile positive  Swallow study recs: Dysphagia 3 (Mechanical Soft);Thin liquid  Liquid Administration via: Cup;No straw  Medication Administration: Whole meds with puree  Supervision: Patient able to self feed;Full supervision/cueing for compensatory strategies  Compensations: Slow rate;Small sips/bites    Assessment and Plan  - KRISY DIX is a 57 y.o. year old female presenting with dehydration secondary to poor PO intake over several days likely related to uncontrolled mental illness and c difficile infection.  # Dehydration - Pt's family was concerned that she had just been sitting on couch x 2 days not eating, not drinking, and possibly not taking meds. Also found c difficile positive (see below). A: resolved with IVF fluids resuscitation. Pt reports that she can get food and stock her cabinets so this does not happen again. P: continue oral intake. Ascertaining outpatient f/u for psychiatric illness will also help. Pt states she follows up regularly.  # Acute kidney injury, resolved - Cr baseline WNL <1, and on admission was 1.25, and resolved to 0.6 after IV fluid administration and after patient began tolerating PO. - Monitor  # c difficile enterocolitis  A: C diff positive. Frequency and volume of stool decreasing. Abdominal exam with only mild tenderness.  P: Continue flagyl and enteric precautions.  - 4/18 Started flagyl 500mg  TID PO x 10 days  # Hypokalemia -  A: Back down to 3.4 today P: s/p K-dur x 1, K today 3.4. Will redose x 2 today - F/u BMET outpatient  # Schizophrenia - Patient supposed to be receiving fluphenazine 25 mg q 14 days, but uncertain of that is actually taking place; Regardless of whether patient is receiving med, her mental illness is clearly preventing her from being able to care for herself as an outpatient as evidence by 3  admission within one month.   - Patient is poor historian and on phone call night of admission, family thought she had missed appointment for most recent fluphenazine shot. No signs of EPS. Pt herself states she makes it to all appointments except last one due to being hospitalized. Unsure if this is truly the case. - Dosed fluphenazine shot 4/18; per pt, gets shots at [facility] with Dr. Deatra James. - Continue benztropine  - Ordered ACT team call as this could help patient greatly if not already set up - will discuss with psych social worker to help ensure the best possible MH resources outpatient.  - Pt sets up appts at [facility] ahead of time and calls transportation services; reports recent missed appts due to hospitalizations  - Per [place], our weekday CSW needs to contact Olegario Messier to decide ACT Team vs Transition Team srvcs upon d/c (see 4/19 CSW note) and can help set up next outpatient clinic appt.  - Per CM, need to order psych c/s. Has had psych see her in past, unsure if she has suppt  via ACT or transition team. Consulted and called. Await recs.  # ACS - Rule out, mild elevated trop x 1 in ED an T wave inversion in V2-V5 thought likely due to strain with dehydration for unknown number of days. - Troponin i neg x 3, stable T wave inversion on repeat EKG with mild LAD (lead placement error?)  - Pt had previously refused cardiac stress test; Re-addressed today and patient continues to refuse both inpatient and outpatient stress test, stating "It will give me a heart attack." When I explained that it will help evaluate heart for risk of disease/dysfunction, pt still refuses. F/u discussion as outpatient.  # Diabetes Mellitus, Type 2  - Restarted home metformin 4/21 - Sliding scale insulin   # Hyperlipidemia - Simvastatin 20mg  daily; will determine outpatient therapy at discharge.    # Possible UTI - Patient treated with Rocephin 1 gm In  ED given presence of leukocytes and bacteria in urine. Urine culture negative for single organism. P: will stop CTX.   # GERD - C diff positive - continue holding home prilosec - Treating with metronidazole 500mg  TID started 4/18 at 2pm x 10 days; consider florastor - Started tums 1 tab TID with meals 4/21  FENGI - Diet dysphagia 3 with thin liquid via cup, whole meds with puree, self-feed with full supervision / cueing for compensatory strategies (slow rate, small sips/bites).  PPX - SQ heparin  DISPO - Pending eval by Psych and ACT team. There is concern that patient will not be safe under self care at home given history of not drinking for 48 hrs. Pt refused OT x 2. PT recommends HHPT with 24 hours supervision, OT recs 3-in-1 bedside commode  CODE status: Full code  Simone Curia Pager: [phone] 02/22/2013, 7:29 AM

## 2013-02-22 DIAGNOSIS — F205 Residual schizophrenia: Secondary | ICD-10-CM

## 2013-02-22 LAB — BASIC METABOLIC PANEL
BUN: 5 mg/dL — ABNORMAL LOW (ref 6–23)
Creatinine, Ser: 0.69 mg/dL (ref 0.50–1.10)
GFR calc Af Amer: >90 mL/min (ref >90)
GFR calc non Af Amer: >90 mL/min (ref >90)
Glucose, Bld: 118 mg/dL — ABNORMAL HIGH (ref 70–99)

## 2013-02-22 LAB — GLUCOSE, CAPILLARY
Glucose-Capillary: 118 mg/dL — ABNORMAL HIGH (ref 70–99)
Glucose-Capillary: 118 mg/dL — ABNORMAL HIGH (ref 70–99)

## 2013-02-22 MED ORDER — DSS 100 MG PO CAPS: 100.0000 mg | ORAL_CAPSULE | Freq: Every day | ORAL | Status: DC | PRN

## 2013-02-22 MED ORDER — POTASSIUM CHLORIDE CRYS ER 20 MEQ PO TBCR
40.0000 meq | EXTENDED_RELEASE_TABLET | Freq: Once | ORAL | Status: AC
  Administered 2013-02-22: 40 meq via ORAL
  Filled 2013-02-22: qty 2

## 2013-02-22 MED ORDER — METRONIDAZOLE 500 MG PO TABS: 500.0000 mg | ORAL_TABLET | Freq: Three times a day (TID) | ORAL | Status: AC

## 2013-02-22 MED ORDER — ATORVASTATIN CALCIUM 40 MG PO TABS: 40.0000 mg | ORAL_TABLET | Freq: Every day | ORAL | Status: DC

## 2013-02-22 MED ORDER — CALCIUM CARBONATE ANTACID 500 MG PO CHEW: 1.0000 | CHEWABLE_TABLET | Freq: Three times a day (TID) | ORAL | Status: DC

## 2013-02-22 NOTE — Progress Notes (Signed)
Physical Therapy Treatment Patient Details Name: Krista [name] MRN: [id number] DOB: 1956/05/23 Today's Date: 02/22/2013 Time: 4098-1191 PT Time Calculation (min): 13 min  PT Assessment / Plan / Recommendation Comments on Treatment Session   Pt eager to d/c home.  Overall moving well.      Follow Up Recommendations  Home health PT;Supervision/Assistance - 24 hour     Does the patient have the potential to tolerate intense rehabilitation     Barriers to Discharge        Equipment Recommendations  None recommended by PT    Recommendations for Other Services    Frequency Min 3X/week   Plan Discharge plan remains appropriate    Precautions / Restrictions Precautions Precautions: Fall Restrictions Weight Bearing Restrictions: No       Mobility  Bed Mobility Bed Mobility: Supine to Sit;Sitting - Scoot to Edge of Bed Supine to Sit: 6: Modified independent (Device/Increase time);With rails;HOB elevated Sitting - Scoot to Edge of Bed: 6: Modified independent (Device/Increase time) Transfers Transfers: Sit to Stand;Stand to Sit Sit to Stand: With upper extremity assist;From bed;6: Modified independent (Device/Increase time);From chair/3-in-1 Stand to Sit: 6: Modified independent (Device/Increase time);With upper extremity assist;With armrests;To chair/3-in-1 Ambulation/Gait Ambulation/Gait Assistance: 5: Supervision Ambulation Distance (Feet): 100 Feet Assistive device: None Ambulation/Gait Assistance Details: Distance due to pt requesting to return to her room "I dont want to do anymore".  Within distance able to perform gait speed change 2x's, scanning L<>R, & 380 degree turn 1x.   Gait Pattern: Step-through pattern;Decreased stride length General Gait Details: pt with kyphotic posture & keeps UE's close to side with elbows bent.   Stairs: No Corporate treasurer: No    Exercises General Exercises - Lower Extremity Hip ABduction/ADduction:  Strengthening;Both;10 reps;Standing Hip Flexion/Marching: Both;10 reps;Standing Heel Raises: Both;10 reps;Standing Mini-Sqauts: 10 reps;Standing     PT Goals Acute Rehab PT Goals Time For Goal Achievement: 02/27/13 Potential to Achieve Goals: Fair Pt will go Supine/Side to Sit: Independently;with HOB 0 degrees PT Goal: Supine/Side to Sit - Progress: Progressing toward goal Pt will go Sit to Supine/Side: Independently;with HOB 0 degrees Pt will go Sit to Stand: with supervision;with upper extremity assist PT Goal: Sit to Stand - Progress: Met Pt will go Stand to Sit: with supervision PT Goal: Stand to Sit - Progress: Met Pt will Transfer Bed to Chair/Chair to Bed: with supervision Pt will Ambulate: 51 - 150 feet;with supervision;with rolling walker PT Goal: Ambulate - Progress: Progressing toward goal  Visit Information  Last PT Received On: 02/22/13 Assistance Needed: +1    Subjective Data  Subjective: "I want to go back to my room"   Cognition  Cognition Arousal/Alertness: Awake/alert Behavior During Therapy: WFL for tasks assessed/performed Overall Cognitive Status: History of cognitive impairments - at baseline    Balance     End of Session PT - End of Session Activity Tolerance: Patient tolerated treatment well;Other (comment) (self limiting) Patient left: in chair;with call bell/phone within reach Nurse Communication: Mobility status    Verdell Face, [place] [phone] 02/22/2013

## 2013-02-22 NOTE — Progress Notes (Signed)
1600 seen by pscyhiatrist  Before discharge . No further orders

## 2013-02-22 NOTE — Consult Note (Signed)
Reason for Consult:Schizophrenia medication management Referring Physician: Dr. Leona Singleton, MD  Krista [name] is an 57 y.o. female.  HPI: Patient was seen and chart reviewed. Vision has been diagnosed with chronic paranoid schizophrenia and has been receiving outpatient psychiatric services at [facility]. Patient has been living with her mother and brother. Patient reported her brother has been teasing her and she reported to her mother and the site and weak the patient reported she has been compliant with her medication and has no reported adverse effects. Patient denies current symptoms of depression anxiety, paranoia and psychosis.  Mental Status Examination: Patient appeared as per his stated age, sitting on a chair calm quite cooperative. Patient has a Patent examiner and  Monotonus speech.she has maintaining good eye contact. Patient has good mood and his affect was constricted. Marland Kitchen His thought process is linear and goal directed. Patient has denied suicidal, homicidal ideations, intentions or plans. Patient has no evidence of auditory or visual hallucinations, delusions, and paranoia. Patient has fair insight judgment and impulse control.  Past Medical History  Diagnosis Date  . Arthritis   . Hyperlipidemia   . Hypertension   . Allergy   . GERD (gastroesophageal reflux disease)   . Back pain   . Type II diabetes mellitus   . Schizophrenia     /notes 02/17/2013    Past Surgical History  Procedure Laterality Date  . Tubal ligation      Family History  Problem Relation Age of Onset  . Diabetes Mother   . COPD Mother   . Heart disease Mother   . Hyperlipidemia Mother   . Hypertension Mother   . Arthritis Mother     Social History:  reports that she has been smoking Cigarettes.  She has been smoking about 1.00 pack per day. She has never used smokeless tobacco. She reports that she does not drink alcohol. Her drug history is not on file.  Allergies:   Allergies  Allergen Reactions  . Codeine     REACTION: itching    Medications: I have reviewed the patient's current medications.  Results for orders placed during the hospital encounter of 02/17/13 (from the past 48 hour(s))  GLUCOSE, CAPILLARY     Status: Abnormal   Collection Time    02/20/13  4:03 PM      Result Value Range   Glucose-Capillary 144 (*) 70 - 99 mg/dL  GLUCOSE, CAPILLARY     Status: Abnormal   Collection Time    02/20/13  9:08 PM      Result Value Range   Glucose-Capillary 124 (*) 70 - 99 mg/dL   Comment 1 Documented in Chart     Comment 2 Notify RN    GLUCOSE, CAPILLARY     Status: Abnormal   Collection Time    02/21/13  6:34 AM      Result Value Range   Glucose-Capillary 111 (*) 70 - 99 mg/dL   Comment 1 Notify RN     Comment 2 Documented in Chart    BASIC METABOLIC PANEL     Status: Abnormal   Collection Time    02/21/13  7:15 AM      Result Value Range   Sodium 128 (*) 135 - 145 mEq/L   Potassium 3.3 (*) 3.5 - 5.1 mEq/L   Chloride 97  96 - 112 mEq/L   CO2 20  19 - 32 mEq/L   Glucose, Bld 113 (*) 70 - 99 mg/dL  BUN 9  6 - 23 mg/dL   Creatinine, Ser 3.08  0.50 - 1.10 mg/dL   Calcium 8.3 (*) 8.4 - 10.5 mg/dL   GFR calc non Af Amer >90  >90 mL/min   GFR calc Af Amer >90  >90 mL/min   Comment:            The eGFR has been calculated     using the CKD EPI equation.     This calculation has not been     validated in all clinical     situations.     eGFR's persistently     <90 mL/min signify     possible Chronic Kidney Disease.  GLUCOSE, CAPILLARY     Status: Abnormal   Collection Time    02/21/13 11:26 AM      Result Value Range   Glucose-Capillary 140 (*) 70 - 99 mg/dL  GLUCOSE, CAPILLARY     Status: Abnormal   Collection Time    02/21/13  4:50 PM      Result Value Range   Glucose-Capillary 140 (*) 70 - 99 mg/dL  GLUCOSE, CAPILLARY     Status: Abnormal   Collection Time    02/21/13  9:05 PM      Result Value Range   Glucose-Capillary  147 (*) 70 - 99 mg/dL   Comment 1 Documented in Chart     Comment 2 Notify RN    BASIC METABOLIC PANEL     Status: Abnormal   Collection Time    02/22/13  5:15 AM      Result Value Range   Sodium 133 (*) 135 - 145 mEq/L   Potassium 3.4 (*) 3.5 - 5.1 mEq/L   Chloride 99  96 - 112 mEq/L   CO2 25  19 - 32 mEq/L   Glucose, Bld 118 (*) 70 - 99 mg/dL   BUN 5 (*) 6 - 23 mg/dL   Creatinine, Ser 6.57  0.50 - 1.10 mg/dL   Calcium 8.4  8.4 - 84.6 mg/dL   GFR calc non Af Amer >90  >90 mL/min   GFR calc Af Amer >90  >90 mL/min   Comment:            The eGFR has been calculated     using the CKD EPI equation.     This calculation has not been     validated in all clinical     situations.     eGFR's persistently     <90 mL/min signify     possible Chronic Kidney Disease.  GLUCOSE, CAPILLARY     Status: Abnormal   Collection Time    02/22/13  6:21 AM      Result Value Range   Glucose-Capillary 118 (*) 70 - 99 mg/dL   Comment 1 Notify RN     Comment 2 Documented in Chart    GLUCOSE, CAPILLARY     Status: Abnormal   Collection Time    02/22/13 11:35 AM      Result Value Range   Glucose-Capillary 118 (*) 70 - 99 mg/dL   Comment 1 Notify RN      No results found.  Positive for Chronic schizophrenia Blood pressure 118/82, pulse 77, temperature 97.4 F (36.3 C), temperature source Oral, resp. rate 18, height 5\' 5"  (1.651 m), weight 150 lb 5.7 oz (68.2 kg), SpO2 98.00%.   Assessment/Plan: Schizophrenia, chronic stable  Recommendation: Patient does not meet criteria for acute psychiatric hospitalization and patient will  be referred to the outpatient psychiatric services at [facility] which he receives Prolixin intramuscular shots every 2 weeks and Cogentin orally. No additional psychiatric medication was recommended at this time.   Calli Bashor,JANARDHAHA R. 02/22/2013, 12:24 PM

## 2013-02-22 NOTE — Progress Notes (Signed)
1500 discharge instructions given verbalized understanding

## 2013-02-22 NOTE — Progress Notes (Signed)
Prior to admission pt. Had [facility] for [facility] RN, [facility] CSW.  TC to Elizebeth Koller representative to give resumption of care orders for above as well as HH PT.  Pt. Anticipated to dc home in 1-2 days

## 2013-02-23 ENCOUNTER — Inpatient Hospital Stay: Payer: Medicare Other | Admitting: Family Medicine

## 2013-02-23 ENCOUNTER — Other Ambulatory Visit: Payer: Self-pay | Admitting: Family Medicine

## 2013-02-23 NOTE — Progress Notes (Signed)
I have seen and examined this patient. I have discussed with Dr Thekkekandam.  I agree with their findings and plans as documented in their progress note.    

## 2013-02-23 NOTE — Discharge Summary (Signed)
Discharge Summary 02/23/2013 9:34 AM  Krista [name] DOB: [birth year] MRN: [id number]  Date of Admission: 02/17/2013 Date of Discharge: 02/22/2013  PCP: Ardyth Gal, MD Consultants: Psychiatry  Reason for Admission: Dehydration  Discharge Diagnosis Primary 1. Dehydration Secondary 1. c difficile enterocolitis 2. Hypokalemia 3. Acute kidney injury, resolved 4. Schizophrenia 5. EKG changes 6. Diabetes Mellitus Type II 7. Hyperlipidemia 8. Possible UTI 9. GERD   Hospital Course: Krista [name] is a 57 y.o. female with schizophrenia, DM Type II, HTN, and 2 recent admissions for urosepsis and chest pain workup for ACS (negative) who presented 4/17 with dehydration to [facility] after 2 days of lethargy and not taking PO or medications per family, likely related to uncontrolled mental illness and c difficile infection.  1. Dehydration - Patient was found to be c difficile positive, possibly related to multiple recent hospitalizations. Family was also concerned that she had not gone to her last psychiatric appointment to receive routine antipsychotic injection and that she was sitting on the couch for days not eating or drinking and possibly not taking medications. Dehydration resolved with IV fluid resuscitation and patient began tolerating PO. When asked how to prevent dehydration in the future, patient states she plans to stock up her cabinets better and states she follows up regularly for psychiatric medications.   2. C difficile enterocolitis - Patient found to be c difficile positive and flagyl 500mg  PO TID initiated 4/18 and home PPI held. Patient continued having mild abdominal tenderness but frequency and volume of stool were decreasing and she was tolerating PO. Discharged on flagyl until 4/28 with instructions to hold PPI until fully treated.  3. Hypokalemia - Most likely related to GI losses. Received K-dur x 1 4/21 and K came up slightly from 3.3 to 3.4. Redosed x 2 and  will need to f/u BMET at hospital follow-up appointment.  4. Acute kidney injury, resolved - Cr 1.25 on admission, baseline <1. It resolved to 0.6 after IV fluid administration and PO intake.   5. Schizophrenia - Patient's symptoms appeared well-controlled this hospitalization but question if patient is making it to her outpatient appointments. She is supposed to be receiving fluphenazine 25 mg IM q 14 days, but family thinks she missed last dose. Patient states she makes it to all appointments except the last one due to being hospitalized. Regardless of whether patient is receiving med, her mental illness seems to be preventing her from being able to care for herself as an outpatient as evidence by 3 admissions within one month. Dosed fluphenazine shot 4/18, continued benztropine, no signs of EPS. Social work is helping coordinate follow-up with:  -- Dr. Deatra James at [facility], -- ACT vs Transition Team involvement Krista [name] - see 4/19 CSW note) to help coordinate available resources to keep her healthy and out of the hospital, and  -- home health Social Work, Chief Strategy Officer, and PT. Psychiatric consult feels pt's chronic schizophrenia is stable and warrants the above described services but no further psychiatric medications.  6. EKG changes - Patient had mildly elevated troponin to 0.1 and new T wave inversions in leads V2-V5 in [facility] ED, thought likely due to strain with dehydration. Repeat EKG was unchanged but troponin I were negative x 3, pt denied chest pain, and vitals were stable. She had refused cardiac stress test last hospitalization, and when asked this hospitalization she is refusing both inpatient and outpatient cardiac stress test, as she feels it will give her  a heart attack. Team was unable to convince her otherwise. Continue discussion with PCP.  7. Diabetes Mellitus Type II - SSI and held home metformin until creatinine stabilized.  Restarted 4/21 with no issues.  8. Hyperlipidemia - Started simvastatin 20mg  daily for cardioprotection, discharged on atorvastatin 40mg  daily with goal as an outpatient to increase to 80mg  daily for higher-intensity therapy in 57 year old patient with DM and HTN.   9. Possible UTI - UA in ED had leukocytes and bacteria, so dosed rocephin 1 gm. Urine culture was negative for single organism so discontinued ceftriaxone.  10. GERD - Held home prilosec given c difficile infection. Started tums 1 tablet TID with meals in the meantime. F/u at PCP appointment.   Discharge day physical exam: Filed Vitals:   02/22/13 1416  BP: 137/102  Pulse: 98  Temp: 97.1 F (36.2 C)  Resp: 18  BP 118/82  Pulse 77  Temp(Src) 97.4 F (36.3 C) (Oral)  Resp 18  Ht 5\' 5"  (1.651 m)  Wt 150 lb 5.7 oz (68.2 kg)  BMI 25.02 kg/m2  SpO2 98%  General appearance: alert, cooperative and no distress  Lungs: clear to auscultation bilaterally with coarse breath sounds bilaterally  Heart: regular rate and rhythm, S1, S2 normal, no murmur, click, rub or gallop; 2+ radial pulse  Abdomen: Soft, generalized moderate tenderness, nondistended, hypoactive bowel sounds  Extremities: extremities normal, atraumatic, no cyanosis or edema  Neuro: Awake, alert, oriented to person, place, time (except day that it's Tuesday), no focal deficit, occasional tongue protrusion but no rhythmic tremor or movement. Normal speech (for someone with very few teeth).  Heent: Very poor dentition missing most teeth, MMM   Procedures: Prolixin (fluphenazine) shot 02/18/13  Discharge Medications   Medication List    STOP taking these medications       fluconazole 200 MG tablet  Commonly known as:  DIFLUCAN     omeprazole 40 MG capsule  Commonly known as:  PRILOSEC      TAKE these medications       aspirin EC 81 MG tablet  Take 81 mg by mouth daily.     atorvastatin 40 MG tablet  Commonly known as:  LIPITOR  Take 1 tablet (40 mg  total) by mouth daily.     benztropine 1 MG tablet  Commonly known as:  COGENTIN  Take 1 mg by mouth at bedtime.     calcium carbonate 500 MG chewable tablet  Commonly known as:  TUMS - dosed in mg elemental calcium  Chew 1 tablet (200 mg of elemental calcium total) by mouth 3 (three) times daily with meals.     DSS 100 MG Caps  Take 100 mg by mouth daily as needed.     fluPHENAZine decanoate 25 MG/ML injection  Commonly known as:  PROLIXIN  Inject 25 mg into the muscle every 14 (fourteen) days. No instruction given     gabapentin 300 MG capsule  Commonly known as:  NEURONTIN  Take 1 capsule (300 mg total) by mouth 3 (three) times daily.     gemfibrozil 600 MG tablet  Commonly known as:  LOPID  Take 600 mg by mouth 2 (two) times daily.     metFORMIN 500 MG tablet  Commonly known as:  GLUCOPHAGE  Take 500-10,000 mg by mouth 2 (two) times daily with a meal. 2 tabs by mouth qam and 1 tab by mouth qhs     metroNIDAZOLE 500 MG tablet  Commonly known as:  FLAGYL  Take 1 tablet (500 mg total) by mouth every 8 (eight) hours.     niacin 750 MG CR tablet  Commonly known as:  NIASPAN  Take 750 mg by mouth at bedtime.     nitroGLYCERIN 0.4 MG SL tablet  Commonly known as:  NITROSTAT  Place 1 tablet (0.4 mg total) under the tongue every 5 (five) minutes x 3 doses as needed for chest pain.     oxybutynin 5 MG tablet  Commonly known as:  DITROPAN  Take 5 mg by mouth 2 (two) times daily.        Pertinent Hospital Labs / Imaging: Recent Labs  Lab  02/17/13 1929  02/18/13 0640  02/19/13 0505   WBC  5.7  4.1  3.3*   HGB  12.4  11.0*  10.9*   HCT  35.0*  31.2*  31.1*   PLT  214  178  157    Recent Labs  Lab  02/20/13 0605  02/21/13 0715  02/22/13 0515   NA  134*  128*  133*   K  3.5  3.3*  3.4*   CL  104  97  99   CO2  21  20  25    BUN  11  9  5*   CREATININE  0.73  0.67  0.69   GLUCOSE  92  113*  118*   CALCIUM  8.2*  8.3*  8.4    Recent Labs  Lab   02/17/13 1929  02/18/13 0037  02/18/13 0640   TROPONINI  <0.30  <0.30  <0.30   trop i POC at [facility] - 0.10   Recent Labs  Lab  02/21/13 0634  02/21/13 1126  02/21/13 1650  02/21/13 2105  02/22/13 0621   GLUCAP  111*  140*  140*  147*  118*    c difficile positive Cath UA many bacteria, large bili, 15 ket, 30 protein, small leukocytes  UCx:  >=100,000 COLONIES/ML   Culture  Multiple bacterial morphotypes present, none predominant. Suggest appropriate recollection if clinically indicated.  Lactic acid venous - 2.32  Ethyl alcohol neg  UTox neg   Dg Chest 2 View  02/17/2013 *RADIOLOGY REPORT* Clinical Data: Altered mental status CHEST - 2 VIEW Comparison: 01/30/2013 Findings: Cardiomediastinal silhouette is stable. No acute infiltrate or pleural effusion. No pulmonary edema. Osteopenia and degenerative changes thoracic spine. Stable compression deformities thoracic spine. IMPRESSION: No active disease. No significant change. Original Report Authenticated By: Natasha Mead, M.D.   Ct Head Wo Contrast  02/17/2013 *RADIOLOGY REPORT* Clinical Data: Altered mental status CT HEAD WITHOUT CONTRAST Technique: Contiguous axial images were obtained from the base of the skull through the vertex without contrast. Comparison: CT 01/23/2013 Findings: Mild atrophy no acute infarct. Negative for hemorrhage or mass. No change from the prior study. Air-fluid levels in the maxillary and sphenoid sinuses may reflect acute infection. IMPRESSION: No acute intracranial abnormality. Mild atrophy Sinusitis with air-fluid levels. Original Report Authenticated By: Janeece Riggers, M.D.    Swallow study recs: Dysphagia 3 (Mechanical Soft);Thin liquid  Liquid Administration via: Cup;No straw  Medication Administration: Whole meds with puree  Supervision: Patient able to self feed;Full supervision/cueing for compensatory strategies  Compensations: Slow rate;Small sips/bites    Discharge instructions: You were admitted due  to dehydration and with an infection in your gastrointestinal tract called c difficile.  Please continue taking the antibiotic we started (metronidazole) three times daily until 02/28/13. Please continue taking psych medications and going to appointment  every 2 weeks for your fluphenazine shot. Continue to NOT take omeprazole until you are told by your MD otherwise. START taking lipitor daily for your high cholesterol. If you cannot afford this your doctor can recommend a different one. This will be important to help protect your heart.   Condition at discharge: stable  Disposition: Home with home health psych RN, social work, and PT; family lives with patient (mother, brother) and she states they are always there and able to care for her  Pending Tests: None  Follow up:     Follow-up Information   Follow up with [facility], MD On 02/28/2013. (1:45 PM)    Contact information:   [address] [place] [address] [phone]       Schedule an appointment as soon as possible for a visit with [facility]. (Social work helping make this appointment for fluphenazine injections)    Contact information:   [facility] [address] [place] [address] [phone]      Follow up with ACT team / Transition services. (Social work Audiological scientist this up prior to discharge)       Follow up Issues:  - Repeat BMET to check K and Cr - ACT vs Transition Team set up for pt to help manage mental health - Going to [facility] every 2 weeks for prolixin shot - Restart PPI once treatment for c difficile complete and symptoms cleared - Discuss EKG changes and if patient would like any further studies    Simone Curia 02/23/2013 9:34 AM PGY-1 Family Medicine Teaching Service Service pager [phone]

## 2013-02-23 NOTE — Discharge Summary (Signed)
I have seen and examined this patient. I have discussed with Dr Thekkekandam.  I agree with their findings and plans as documented in their discharge note.    

## 2013-02-28 ENCOUNTER — Inpatient Hospital Stay: Payer: Medicare Other | Admitting: Family Medicine

## 2013-03-06 NOTE — Progress Notes (Signed)
Patient was cleared by Psych services and will d/c home today as she refuses placement. CSW met with patient. She will return home with her mother and brother;  CSW arranged follow up appointment at [facility] for patient on  Thursday 02/24/13 at 8:00 a.m.  Patient states she will call [facility] [employer] to arrange pick up.  Patient states that she is feeling better and ready to do home. No further CSW needs.  CSW will sign off.  Lorri Frederick. [place]  [phone]

## 2013-03-09 ENCOUNTER — Inpatient Hospital Stay (HOSPITAL_COMMUNITY): Payer: Medicare Other

## 2013-03-09 ENCOUNTER — Emergency Department (HOSPITAL_COMMUNITY): Payer: Medicare Other

## 2013-03-09 ENCOUNTER — Inpatient Hospital Stay (HOSPITAL_COMMUNITY)
Admission: EM | Admit: 2013-03-09 | Discharge: 2013-03-16 | DRG: 870 | Disposition: A | Discharge status: Other Acute Inpatient Hospital | Payer: Medicare Other | Attending: Pulmonary Disease | Admitting: Pulmonary Disease

## 2013-03-09 DIAGNOSIS — F17200 Nicotine dependence, unspecified, uncomplicated: Secondary | ICD-10-CM | POA: Diagnosis present

## 2013-03-09 DIAGNOSIS — E1139 Type 2 diabetes mellitus with other diabetic ophthalmic complication: Secondary | ICD-10-CM

## 2013-03-09 DIAGNOSIS — Z66 Do not resuscitate: Secondary | ICD-10-CM | POA: Diagnosis not present

## 2013-03-09 DIAGNOSIS — N390 Urinary tract infection, site not specified: Secondary | ICD-10-CM

## 2013-03-09 DIAGNOSIS — R079 Chest pain, unspecified: Secondary | ICD-10-CM

## 2013-03-09 DIAGNOSIS — N179 Acute kidney failure, unspecified: Secondary | ICD-10-CM

## 2013-03-09 DIAGNOSIS — A414 Sepsis due to anaerobes: Principal | ICD-10-CM | POA: Diagnosis present

## 2013-03-09 DIAGNOSIS — R4182 Altered mental status, unspecified: Secondary | ICD-10-CM | POA: Diagnosis present

## 2013-03-09 DIAGNOSIS — L89899 Pressure ulcer of other site, unspecified stage: Secondary | ICD-10-CM | POA: Diagnosis present

## 2013-03-09 DIAGNOSIS — R6521 Severe sepsis with septic shock: Secondary | ICD-10-CM

## 2013-03-09 DIAGNOSIS — E872 Acidosis, unspecified: Secondary | ICD-10-CM | POA: Diagnosis present

## 2013-03-09 DIAGNOSIS — A419 Sepsis, unspecified organism: Secondary | ICD-10-CM

## 2013-03-09 DIAGNOSIS — L89109 Pressure ulcer of unspecified part of back, unspecified stage: Secondary | ICD-10-CM | POA: Diagnosis present

## 2013-03-09 DIAGNOSIS — L8995 Pressure ulcer of unspecified site, unstageable: Secondary | ICD-10-CM | POA: Diagnosis present

## 2013-03-09 DIAGNOSIS — E1142 Type 2 diabetes mellitus with diabetic polyneuropathy: Secondary | ICD-10-CM

## 2013-03-09 DIAGNOSIS — R6520 Severe sepsis without septic shock: Secondary | ICD-10-CM | POA: Diagnosis present

## 2013-03-09 DIAGNOSIS — L89309 Pressure ulcer of unspecified buttock, unspecified stage: Secondary | ICD-10-CM | POA: Diagnosis present

## 2013-03-09 DIAGNOSIS — F209 Schizophrenia, unspecified: Secondary | ICD-10-CM | POA: Diagnosis present

## 2013-03-09 DIAGNOSIS — IMO0002 Reserved for concepts with insufficient information to code with codable children: Secondary | ICD-10-CM | POA: Diagnosis present

## 2013-03-09 DIAGNOSIS — M6282 Rhabdomyolysis: Secondary | ICD-10-CM | POA: Diagnosis present

## 2013-03-09 DIAGNOSIS — E876 Hypokalemia: Secondary | ICD-10-CM

## 2013-03-09 DIAGNOSIS — G9341 Metabolic encephalopathy: Secondary | ICD-10-CM | POA: Diagnosis present

## 2013-03-09 DIAGNOSIS — G9340 Encephalopathy, unspecified: Secondary | ICD-10-CM

## 2013-03-09 DIAGNOSIS — E785 Hyperlipidemia, unspecified: Secondary | ICD-10-CM | POA: Diagnosis present

## 2013-03-09 DIAGNOSIS — E871 Hypo-osmolality and hyponatremia: Secondary | ICD-10-CM | POA: Diagnosis present

## 2013-03-09 DIAGNOSIS — R109 Unspecified abdominal pain: Secondary | ICD-10-CM

## 2013-03-09 DIAGNOSIS — S32009A Unspecified fracture of unspecified lumbar vertebra, initial encounter for closed fracture: Secondary | ICD-10-CM | POA: Diagnosis present

## 2013-03-09 DIAGNOSIS — E1149 Type 2 diabetes mellitus with other diabetic neurological complication: Secondary | ICD-10-CM

## 2013-03-09 DIAGNOSIS — E46 Unspecified protein-calorie malnutrition: Secondary | ICD-10-CM | POA: Diagnosis present

## 2013-03-09 DIAGNOSIS — Y999 Unspecified external cause status: Secondary | ICD-10-CM

## 2013-03-09 DIAGNOSIS — I214 Non-ST elevation (NSTEMI) myocardial infarction: Secondary | ICD-10-CM | POA: Diagnosis present

## 2013-03-09 DIAGNOSIS — I10 Essential (primary) hypertension: Secondary | ICD-10-CM | POA: Diagnosis present

## 2013-03-09 DIAGNOSIS — M129 Arthropathy, unspecified: Secondary | ICD-10-CM | POA: Diagnosis present

## 2013-03-09 DIAGNOSIS — Z6828 Body mass index (BMI) 28.0-28.9, adult: Secondary | ICD-10-CM

## 2013-03-09 DIAGNOSIS — A0472 Enterocolitis due to Clostridium difficile, not specified as recurrent: Secondary | ICD-10-CM | POA: Diagnosis present

## 2013-03-09 DIAGNOSIS — X58XXXA Exposure to other specified factors, initial encounter: Secondary | ICD-10-CM | POA: Diagnosis present

## 2013-03-09 DIAGNOSIS — E119 Type 2 diabetes mellitus without complications: Secondary | ICD-10-CM | POA: Diagnosis present

## 2013-03-09 DIAGNOSIS — J9600 Acute respiratory failure, unspecified whether with hypoxia or hypercapnia: Secondary | ICD-10-CM

## 2013-03-09 DIAGNOSIS — R410 Disorientation, unspecified: Secondary | ICD-10-CM

## 2013-03-09 DIAGNOSIS — K219 Gastro-esophageal reflux disease without esophagitis: Secondary | ICD-10-CM | POA: Diagnosis present

## 2013-03-09 LAB — BLOOD GAS, ARTERIAL
Acid-base deficit: 19.9 mmol/L — ABNORMAL HIGH (ref 0.0–2.0)
FIO2: 0.8 %
MECHVT: 550 mL
O2 Saturation: 97.2 %
PEEP: 5 cmH2O
Patient temperature: 93.2
RATE: 28 resp/min
pO2, Arterial: 80.2 mmHg (ref 80.0–100.0)

## 2013-03-09 LAB — CARBOXYHEMOGLOBIN: O2 Saturation: 90.2 %

## 2013-03-09 LAB — CORTISOL: Cortisol, Plasma: 73.6 ug/dL

## 2013-03-09 LAB — POCT I-STAT 3, VENOUS BLOOD GAS (G3P V)
O2 Saturation: 94 %
pCO2, Ven: 30.5 mmHg — ABNORMAL LOW (ref 45.0–50.0)
pH, Ven: 7.193 — CL (ref 7.250–7.300)
pO2, Ven: 84 mmHg — ABNORMAL HIGH (ref 30.0–45.0)

## 2013-03-09 LAB — POCT I-STAT 3, ART BLOOD GAS (G3+)
Acid-base deficit: 21 mmol/L — ABNORMAL HIGH (ref 0.0–2.0)
Acid-base deficit: 19 mmol/L — ABNORMAL HIGH (ref 0.0–2.0)
Bicarbonate: 8.2 mEq/L — ABNORMAL LOW (ref 20.0–24.0)
Bicarbonate: 9.4 mEq/L — ABNORMAL LOW (ref 20.0–24.0)
O2 Saturation: 83 %
TCO2: 10 mmol/L (ref 0–100)
pCO2 arterial: 28.3 mmHg — ABNORMAL LOW (ref 35.0–45.0)
pCO2 arterial: 29.2 mmHg — ABNORMAL LOW (ref 35.0–45.0)
pH, Arterial: 7.07 — CL (ref 7.350–7.450)
pO2, Arterial: 81 mmHg (ref 80.0–100.0)
pO2, Arterial: 62 mmHg — ABNORMAL LOW (ref 80.0–100.0)

## 2013-03-09 LAB — CBC WITH DIFFERENTIAL/PLATELET
Basophils Absolute: 0 10*3/uL (ref 0.0–0.1)
Basophils Relative: 0 % (ref 0–1)
Eosinophils Relative: 0 % (ref 0–5)
HCT: 29 % — ABNORMAL LOW (ref 36.0–46.0)
MCHC: 36.9 g/dL — ABNORMAL HIGH (ref 30.0–36.0)
MCV: 81.5 fL (ref 78.0–100.0)
Monocytes Absolute: 0.7 10*3/uL (ref 0.1–1.0)
Neutro Abs: 23.4 10*3/uL — ABNORMAL HIGH (ref 1.7–7.7)
Platelets: 426 10*3/uL — ABNORMAL HIGH (ref 150–400)
RDW: 14.2 % (ref 11.5–15.5)

## 2013-03-09 LAB — NA AND K (SODIUM & POTASSIUM), RAND UR
Potassium Urine: 9 mEq/L
Sodium, Ur: 70 mEq/L

## 2013-03-09 LAB — COMPREHENSIVE METABOLIC PANEL
AST: 233 U/L — ABNORMAL HIGH (ref 0–37)
Albumin: 2.1 g/dL — ABNORMAL LOW (ref 3.5–5.2)
Calcium: 6 mg/dL — CL (ref 8.4–10.5)
Creatinine, Ser: 8.29 mg/dL — ABNORMAL HIGH (ref 0.50–1.10)

## 2013-03-09 LAB — BASIC METABOLIC PANEL
CO2: 8 mEq/L — CL (ref 19–32)
Calcium: 4.5 mg/dL — CL (ref 8.4–10.5)
Creatinine, Ser: 5.71 mg/dL — ABNORMAL HIGH (ref 0.50–1.10)
GFR calc non Af Amer: 8 mL/min — ABNORMAL LOW (ref >90)
Sodium: 120 mEq/L — ABNORMAL LOW (ref 135–145)

## 2013-03-09 LAB — CBC
MCH: 30.5 pg (ref 26.0–34.0)
MCHC: 37.2 g/dL — ABNORMAL HIGH (ref 30.0–36.0)
Platelets: 366 10*3/uL (ref 150–400)
RBC: 3.87 MIL/uL (ref 3.87–5.11)

## 2013-03-09 LAB — POCT I-STAT, CHEM 8
BUN: 61 mg/dL — ABNORMAL HIGH (ref 6–23)
Chloride: 84 mEq/L — ABNORMAL LOW (ref 96–112)
Creatinine, Ser: 8.6 mg/dL — ABNORMAL HIGH (ref 0.50–1.10)
Potassium: 3.1 mEq/L — ABNORMAL LOW (ref 3.5–5.1)
Sodium: 112 mEq/L — CL (ref 135–145)

## 2013-03-09 LAB — URINE MICROSCOPIC-ADD ON

## 2013-03-09 LAB — URINALYSIS, ROUTINE W REFLEX MICROSCOPIC
Protein, ur: >300 mg/dL — AB
Urobilinogen, UA: 0.2 mg/dL (ref 0.0–1.0)

## 2013-03-09 LAB — MRSA PCR SCREENING: MRSA by PCR: NEGATIVE

## 2013-03-09 LAB — PROCALCITONIN: Procalcitonin: 3.6 ng/mL

## 2013-03-09 LAB — PROTIME-INR: INR: 1.2 (ref 0.00–1.49)

## 2013-03-09 LAB — RAPID URINE DRUG SCREEN, HOSP PERFORMED
Opiates: NOT DETECTED
Tetrahydrocannabinol: NOT DETECTED

## 2013-03-09 LAB — APTT: aPTT: 34 seconds (ref 24–37)

## 2013-03-09 LAB — CK: Total CK: 8025 U/L — ABNORMAL HIGH (ref 7–177)

## 2013-03-09 LAB — AMMONIA: Ammonia: 56 umol/L (ref 11–60)

## 2013-03-09 LAB — TYPE AND SCREEN: Antibody Screen: NEGATIVE

## 2013-03-09 LAB — LACTIC ACID, PLASMA: Lactic Acid, Venous: 1.7 mmol/L (ref 0.5–2.2)

## 2013-03-09 MED ORDER — MIDAZOLAM HCL 2 MG/2ML IJ SOLN: 2.0000 mg | INTRAMUSCULAR | Status: DC | PRN

## 2013-03-09 MED ORDER — METRONIDAZOLE IN NACL 5-0.79 MG/ML-% IV SOLN
500.0000 mg | Freq: Three times a day (TID) | INTRAVENOUS | Status: DC
  Administered 2013-03-09 – 2013-03-14 (×14): 500 mg via INTRAVENOUS
  Filled 2013-03-09 (×16): qty 100

## 2013-03-09 MED ORDER — ASPIRIN 300 MG RE SUPP
300.0000 mg | RECTAL | Status: AC
  Administered 2013-03-09: 300 mg via RECTAL
  Filled 2013-03-09: qty 1

## 2013-03-09 MED ORDER — SODIUM CHLORIDE 0.9 % IV BOLUS (SEPSIS)
2000.0000 mL | Freq: Once | INTRAVENOUS | Status: AC
  Administered 2013-03-10: 2000 mL via INTRAVENOUS

## 2013-03-09 MED ORDER — PANTOPRAZOLE SODIUM 40 MG IV SOLR
40.0000 mg | Freq: Every day | INTRAVENOUS | Status: DC
  Administered 2013-03-09: 40 mg via INTRAVENOUS
  Filled 2013-03-09 (×2): qty 40

## 2013-03-09 MED ORDER — NOREPINEPHRINE BITARTRATE 1 MG/ML IJ SOLN
2.0000 ug/min | Freq: Once | INTRAVENOUS | Status: DC
  Filled 2013-03-09: qty 4

## 2013-03-09 MED ORDER — VANCOMYCIN 50 MG/ML ORAL SOLUTION
125.0000 mg | Freq: Four times a day (QID) | ORAL | Status: DC
  Administered 2013-03-09 – 2013-03-10 (×3): 125 mg via ORAL
  Filled 2013-03-09 (×8): qty 2.5

## 2013-03-09 MED ORDER — SODIUM CHLORIDE 0.45 % IV SOLN
INTRAVENOUS | Status: DC
  Filled 2013-03-09 (×2): qty 900

## 2013-03-09 MED ORDER — SODIUM BICARBONATE 8.4 % IV SOLN
INTRAVENOUS | Status: DC
  Administered 2013-03-09 – 2013-03-10 (×2): via INTRAVENOUS
  Filled 2013-03-09 (×6): qty 150

## 2013-03-09 MED ORDER — NOREPINEPHRINE BITARTRATE 1 MG/ML IJ SOLN
INTRAMUSCULAR | Status: AC
  Administered 2013-03-09: 5 ug/kg/min
  Filled 2013-03-09: qty 4

## 2013-03-09 MED ORDER — POTASSIUM CHLORIDE 20 MEQ/15ML (10%) PO LIQD
40.0000 meq | Freq: Once | ORAL | Status: AC
  Administered 2013-03-09: 40 meq
  Filled 2013-03-09: qty 30

## 2013-03-09 MED ORDER — SODIUM CHLORIDE 0.45 % IV SOLN: INTRAVENOUS | Status: DC

## 2013-03-09 MED ORDER — DEXTROSE 5 % IV SOLN: 2.0000 g | Freq: Once | INTRAVENOUS | Status: DC

## 2013-03-09 MED ORDER — SODIUM CHLORIDE 0.9 % IV BOLUS (SEPSIS)
1000.0000 mL | Freq: Once | INTRAVENOUS | Status: AC
  Administered 2013-03-09: 1000 mL via INTRAVENOUS

## 2013-03-09 MED ORDER — DEXTROSE 5 % IV SOLN
500.0000 mg | INTRAVENOUS | Status: DC
  Administered 2013-03-10 – 2013-03-13 (×4): 500 mg via INTRAVENOUS
  Filled 2013-03-09 (×5): qty 0.5

## 2013-03-09 MED ORDER — SODIUM BICARBONATE 8.4 % IV SOLN
INTRAVENOUS | Status: AC
  Administered 2013-03-09: 100 meq via INTRAVENOUS
  Filled 2013-03-09: qty 100

## 2013-03-09 MED ORDER — VANCOMYCIN HCL IN DEXTROSE 1-5 GM/200ML-% IV SOLN
1000.0000 mg | Freq: Once | INTRAVENOUS | Status: AC
  Administered 2013-03-09: 1000 mg via INTRAVENOUS
  Filled 2013-03-09: qty 200

## 2013-03-09 MED ORDER — SODIUM CHLORIDE 0.9 % IV SOLN
250.0000 mL | INTRAVENOUS | Status: DC | PRN
  Administered 2013-03-12 – 2013-03-14 (×2): 250 mL via INTRAVENOUS

## 2013-03-09 MED ORDER — VANCOMYCIN HCL 500 MG IV SOLR
500.0000 mg | Freq: Once | INTRAVENOUS | Status: AC
  Administered 2013-03-10: 500 mg via INTRAVENOUS
  Filled 2013-03-09: qty 500

## 2013-03-09 MED ORDER — DEXTROSE 5 % IV SOLN
2.0000 g | Freq: Once | INTRAVENOUS | Status: AC
  Administered 2013-03-09: 2 g via INTRAVENOUS
  Filled 2013-03-09: qty 2

## 2013-03-09 MED ORDER — SODIUM CHLORIDE 0.9 % IV SOLN
10.0000 ug/h | INTRAVENOUS | Status: DC
  Administered 2013-03-09: 50 ug/h via INTRAVENOUS
  Filled 2013-03-09: qty 50

## 2013-03-09 MED ORDER — SODIUM BICARBONATE 8.4 % IV SOLN
50.0000 meq | Freq: Once | INTRAVENOUS | Status: AC
  Administered 2013-03-09: 50 meq via INTRAVENOUS

## 2013-03-09 MED ORDER — ALBUTEROL SULFATE HFA 108 (90 BASE) MCG/ACT IN AERS
4.0000 | INHALATION_SPRAY | RESPIRATORY_TRACT | Status: DC | PRN
  Filled 2013-03-09: qty 6.7

## 2013-03-09 MED ORDER — SODIUM CHLORIDE 0.9 % IV SOLN
25.0000 ug/h | INTRAVENOUS | Status: DC
  Administered 2013-03-12: 50 ug/h via INTRAVENOUS
  Filled 2013-03-09 (×3): qty 50

## 2013-03-09 MED ORDER — FENTANYL BOLUS VIA INFUSION
25.0000 ug | Freq: Four times a day (QID) | INTRAVENOUS | Status: DC | PRN
  Filled 2013-03-09: qty 100

## 2013-03-09 MED ORDER — SODIUM CHLORIDE 0.9 % IV BOLUS (SEPSIS): 1000.0000 mL | INTRAVENOUS | Status: DC | PRN

## 2013-03-09 MED ORDER — ASPIRIN 81 MG PO CHEW
324.0000 mg | CHEWABLE_TABLET | ORAL | Status: AC
  Filled 2013-03-09: qty 4

## 2013-03-09 MED ORDER — SODIUM CHLORIDE 0.45 % IV SOLN
INTRAVENOUS | Status: DC
  Filled 2013-03-09: qty 1000

## 2013-03-09 MED ORDER — HEPARIN SODIUM (PORCINE) 5000 UNIT/ML IJ SOLN
5000.0000 [IU] | Freq: Three times a day (TID) | INTRAMUSCULAR | Status: DC
  Administered 2013-03-09 – 2013-03-16 (×21): 5000 [IU] via SUBCUTANEOUS
  Filled 2013-03-09 (×23): qty 1

## 2013-03-09 MED ORDER — SODIUM CHLORIDE 0.9 % IV SOLN
0.0300 [IU]/min | INTRAVENOUS | Status: DC
  Administered 2013-03-09 – 2013-03-14 (×5): 0.03 [IU]/min via INTRAVENOUS
  Filled 2013-03-09 (×5): qty 2.5

## 2013-03-09 MED ORDER — DEXTROSE 5 % IV SOLN
INTRAVENOUS | Status: AC
  Filled 2013-03-09: qty 250

## 2013-03-09 MED ORDER — METRONIDAZOLE 500 MG PO TABS
500.0000 mg | ORAL_TABLET | Freq: Three times a day (TID) | ORAL | Status: DC
  Filled 2013-03-09 (×2): qty 1

## 2013-03-09 MED ORDER — NOREPINEPHRINE BITARTRATE 1 MG/ML IJ SOLN
5.0000 ug/min | INTRAVENOUS | Status: DC
  Administered 2013-03-09: 30 ug/min via INTRAVENOUS
  Filled 2013-03-09: qty 4

## 2013-03-09 MED ORDER — HYDROCORTISONE SOD SUCCINATE 100 MG IJ SOLR
50.0000 mg | Freq: Four times a day (QID) | INTRAMUSCULAR | Status: DC
  Administered 2013-03-09 – 2013-03-11 (×6): 50 mg via INTRAVENOUS
  Filled 2013-03-09 (×10): qty 1

## 2013-03-09 MED ORDER — SODIUM CHLORIDE 0.9 % IV SOLN
1.0000 mg/h | INTRAVENOUS | Status: DC
  Filled 2013-03-09: qty 10

## 2013-03-09 MED ORDER — SODIUM BICARBONATE 8.4 % IV SOLN
INTRAVENOUS | Status: AC
  Administered 2013-03-09: 50 meq via INTRAVENOUS
  Filled 2013-03-09: qty 50

## 2013-03-09 MED ORDER — PIPERACILLIN-TAZOBACTAM 3.375 G IVPB 30 MIN
3.3750 g | Freq: Once | INTRAVENOUS | Status: AC
  Administered 2013-03-09: 3.375 g via INTRAVENOUS
  Filled 2013-03-09: qty 50

## 2013-03-09 MED ORDER — ETOMIDATE 2 MG/ML IV SOLN
INTRAVENOUS | Status: AC | PRN
  Administered 2013-03-09: 20 mg via INTRAVENOUS

## 2013-03-09 MED ORDER — SODIUM CHLORIDE 0.45 % IV SOLN
INTRAVENOUS | Status: DC
  Administered 2013-03-09: 22:00:00 via INTRAVENOUS

## 2013-03-09 MED ORDER — FENTANYL CITRATE 0.05 MG/ML IJ SOLN: 100.0000 ug | Freq: Once | INTRAMUSCULAR | Status: DC

## 2013-03-09 MED ORDER — ROCURONIUM BROMIDE 50 MG/5ML IV SOLN
INTRAVENOUS | Status: AC | PRN
  Administered 2013-03-09: 100 mg via INTRAVENOUS

## 2013-03-09 MED ORDER — NOREPINEPHRINE BITARTRATE 1 MG/ML IJ SOLN
5.0000 ug/min | INTRAVENOUS | Status: DC
  Administered 2013-03-09: 30 ug/min via INTRAVENOUS
  Administered 2013-03-10: 20 ug/min via INTRAVENOUS
  Administered 2013-03-10: 20.053 ug/min via INTRAVENOUS
  Administered 2013-03-11: 13 ug/min via INTRAVENOUS
  Administered 2013-03-13: 3 ug/min via INTRAVENOUS
  Filled 2013-03-09 (×7): qty 16

## 2013-03-09 MED ORDER — INSULIN ASPART 100 UNIT/ML ~~LOC~~ SOLN
1.0000 [IU] | SUBCUTANEOUS | Status: DC
  Administered 2013-03-10 (×3): 3 [IU] via SUBCUTANEOUS

## 2013-03-09 MED ORDER — SODIUM BICARBONATE 8.4 % IV SOLN
INTRAVENOUS | Status: DC
  Filled 2013-03-09: qty 150

## 2013-03-09 NOTE — Procedures (Signed)
Arterial Catheter Insertion Procedure Note Krista [name] [id number] September 08, 1956  Procedure: Insertion of Arterial Catheter  Indications: Blood pressure monitoring  Procedure Details Consent: Unable to obtain consent because of emergent medical necessity. Time Out: Verified patient identification, verified procedure, site/side was marked, verified correct patient position, special equipment/implants available, medications/allergies/relevent history reviewed, required imaging and test results available.  Performed  Maximum sterile technique was used including antiseptics, gloves, gown, hand hygiene and sheet. Skin prep: Chlorhexidine; local anesthetic administered 22 gauge catheter was inserted into right radial artery using the Seldinger technique.  Evaluation Blood flow good; BP tracing good. Complications: No apparent complications.   [name] 03/09/2013

## 2013-03-09 NOTE — H&P (Signed)
PULMONARY  / CRITICAL CARE MEDICINE  Name: Krista [name] MRN: [id number] DOB: January 03, 1956    ADMISSION DATE:  03/09/2013 CONSULTATION DATE:  03/09/13   REFERRING MD :  Lynelle Doctor PRIMARY SERVICE: PCCM  CHIEF COMPLAINT:  Septic shock   BRIEF PATIENT DESCRIPTION: 57 y/o female with recent c.diff and schizophrenia was brought in to [facility] ED on 5/7 by EMS after her family said she had been unresponsive for 24 hours.  She was in septic shock, AKI, and had diarrhea and a UTI.  SIGNIFICANT EVENTS / STUDIES:  5/7 CT head >> 5/7 CT neck >> 5/7 RUQ U/S >>  LINES / TUBES: 5/7 ETT (EDP) >> 5/7 L [place] CVL (EDP) >>  CULTURES: 5/7 blood >> 5/7 urine >> 5/7 c.diff >>  ANTIBIOTICS: 5/7 vanc >> 5/7 zosyn x1 5/7 cefepime >> 5/7 vanc oral >> 5/7 flagyl oral >>  HISTORY OF PRESENT ILLNESS:  57 y/o female with recent c.diff and schizophrenia was brought in to [facility] ED on 5/7 by EMS after her family said she had been unresponsive for 24 hours.  She was in septic shock, AKI, and had diarrhea and a UTI.  She was unable to provide history due to her intubation and no family was available in the ED.  PAST MEDICAL HISTORY :  Past Medical History  Diagnosis Date  . Arthritis   . Hyperlipidemia   . Hypertension   . Allergy   . GERD (gastroesophageal reflux disease)   . Back pain   . Type II diabetes mellitus   . Schizophrenia     /notes 02/17/2013   Past Surgical History  Procedure Laterality Date  . Tubal ligation     Prior to Admission medications   Medication Sig Start Date End Date Taking? Authorizing Provider  aspirin EC 81 MG tablet Take 81 mg by mouth daily.    Historical Provider, MD  atorvastatin (LIPITOR) 40 MG tablet Take 1 tablet (40 mg total) by mouth daily. 02/22/13   Leona Singleton, MD  benztropine (COGENTIN) 1 MG tablet Take 1 mg by mouth at bedtime. 02/13/12   Ardyth Gal, MD  calcium carbonate (TUMS - DOSED IN MG ELEMENTAL CALCIUM) 500 MG chewable tablet Chew 1 tablet (200  mg of elemental calcium total) by mouth 3 (three) times daily with meals. 02/22/13   Leona Singleton, MD  Docusate Sodium (DSS) 100 MG CAPS Take 100 mg by mouth daily as needed. 02/22/13   Leona Singleton, MD  fluPHENAZine decanoate (PROLIXIN) 25 MG/ML injection Inject 25 mg into the muscle every 14 (fourteen) days. No instruction given    Historical Provider, MD  gabapentin (NEURONTIN) 300 MG capsule take 1 capsule by mouth three times a day 02/23/13   Ardyth Gal, MD  gemfibrozil (LOPID) 600 MG tablet Take 600 mg by mouth 2 (two) times daily. 07/30/12   Ardyth Gal, MD  metFORMIN (GLUCOPHAGE) 500 MG tablet Take 500-10,000 mg by mouth 2 (two) times daily with a meal. 2 tabs by mouth qam and 1 tab by mouth qhs 07/30/12   Ardyth Gal, MD  niacin (NIASPAN) 750 MG CR tablet Take 750 mg by mouth at bedtime. 04/20/12   Ardyth Gal, MD  nitroGLYCERIN (NITROSTAT) 0.4 MG SL tablet Place 1 tablet (0.4 mg total) under the tongue every 5 (five) minutes x 3 doses as needed for chest pain. 02/03/13   Lonia Skinner, MD  oxybutynin (DITROPAN) 5 MG tablet Take 5 mg by mouth 2 (two) times daily.  02/16/12   Ardyth Gal, MD   Allergies  Allergen Reactions  . Codeine     REACTION: itching    FAMILY HISTORY:  Family History  Problem Relation Age of Onset  . Diabetes Mother   . COPD Mother   . Heart disease Mother   . Hyperlipidemia Mother   . Hypertension Mother   . Arthritis Mother    SOCIAL HISTORY:  reports that she has been smoking Cigarettes.  She has been smoking about 1.00 pack per day. She has never used smokeless tobacco. She reports that she does not drink alcohol. Her drug history is not on file.  REVIEW OF SYSTEMS:  Cannot obtain due to mechanical ventilation  SUBJECTIVE:   VITAL SIGNS: Temp:  [92.3 F (33.5 C)-94.3 F (34.6 C)] 92.3 F (33.5 C) (05/07 2000) Pulse Rate:  [95-102] 100 (05/07 2000) Resp:  [11-28] 28 (05/07 2000) BP: (54-91)/(22-58)  85/55 mmHg (05/07 2000) SpO2:  [94 %-100 %] 98 % (05/07 2000) FiO2 (%):  [100 %] 100 % (05/07 1739) HEMODYNAMICS:   VENTILATOR SETTINGS: Vent Mode:  [-] PRVC FiO2 (%):  [100 %] 100 % Set Rate:  [14 bmp-22 bmp] 22 bmp Vt Set:  [460 mL-500 mL] 460 mL PEEP:  [5 cmH20] 5 cmH20 Plateau Pressure:  [18 cmH20] 18 cmH20 INTAKE / OUTPUT: Intake/Output     05/07 0701 - 05/08 0700   I.V. 2000   Total Intake 2000   Net +2000         PHYSICAL EXAMINATION:  Gen: sedated on vent HEENT: NCAT, PERRL, ETT in place, NG in place, soft collar in place PULM: Rhonchi bilaterally CV: tachy, regular, no mgr, AB: BS infrequent, nontender, no hsm Ext: warm, no edema, no clubbing, mottled hands and feet Derm: pallor, non-blanching purpura, and necrotic/sloughing tissue from perineum to mid back completely encompassing buttocks Neuro: sedated on vent, no response to external stimuli   LABS:  Recent Labs Lab 03/09/13 1747 03/09/13 1759 03/09/13 1801 03/09/13 1813  HGB  --  10.7*  --  11.6*  WBC  --  25.4*  --   --   PLT  --  426*  --   --   NA  --  108*  --  112*  K  --  3.1*  --  3.1*  CL  --  72*  --  84*  CO2  --  11*  --   --   GLUCOSE  --  77  --  78  BUN  --  64*  --  61*  CREATININE  --  8.29*  --  8.60*  CALCIUM  --  6.0*  --   --   AST  --  233*  --   --   ALT  --  83*  --   --   ALKPHOS  --  848*  --   --   BILITOT  --  1.7*  --   --   PROT  --  5.2*  --   --   ALBUMIN  --  2.1*  --   --   INR  --  1.20  --   --   LATICACIDVEN  --   --   --  1.79  TROPONINI  --   --  1.11*  --   PROCALCITON 7.61  --   --   --    No results found for this basename: GLUCAP,  in the last 168 hours  CXR: ETT, CVL in  place, no infiltrate  ASSESSMENT / PLAN:  PULMONARY A: Acute respiratory failure due to increased work of breathing from sepsis and inability to protect airway from encephalopathy P:   -full vent support -ABG now -prn bronchodilators -daily  WUA/SBT/CXR/ABG  CARDIOVASCULAR A: Septic shock NSTEMI> demand ischaemia P:  -sepsis protocol -levophed/vasopressin -stress dose steroids given refractory shock -trend troponin, no role for intervention considering sepsis -tele -daily EKG -consider Echo if survives overnight -a-line  RENAL A:  AKI due to septic shock and rhabdomyolysis Hyponatremia in setting of AKI, profound volume depletion; relatively fast drop in Na considering normal 4/22 P:   -has received 6 L NS, hold further considering sepsis -urine osm, na -serum osm -first, need to volume resuscitate with crystalloid, but considering severely low Na, want to avoid correcting more than in first 8-10 hours -if Na has risen around 10 mEq by 2200 tonight, would use albumin for volume resuscitation -still needs continous saline/bicarb considering volume depletion/rhabdo, so use at low dose for now  GASTROINTESTINAL A:  Diarrhea, likely c.diff Elevated Alk phos and AST, biliary disease?  P:   -see ID -RUQ U/S  HEMATOLOGIC A:  No acute issues P:    INFECTIOUS A:  Septic shock due to UTI and c.diff and cellulitis/skin breakdown on buttocks P:   -wound care -may need diverting colostomy for skin wound if survives -vanc > cellulitis -cefepime > UTI -vanc/flagyl > c.diff  ENDOCRINE A:  DM2 P:   -monitor glucose closely -stress dose HC  NEUROLOGIC A:  Acute encephalopathy from septic shock P:   -Sedation with fentanyl avoid versed gtt -RASS 0 is goal  Code status: full; I called the patient's daughter and sister to let them know that she was severely ill and the likelihood of survival is low considering the severity of her multi-organ failure.  TODAY'S SUMMARY:   I have personally obtained a history, examined the patient, evaluated laboratory and imaging results, formulated the assessment and plan and placed orders. CRITICAL CARE: The patient is critically ill with multiple organ systems failure  and requires high complexity decision making for assessment and support, frequent evaluation and titration of therapies, application of advanced monitoring technologies and extensive interpretation of multiple databases. Critical Care Time devoted to patient care services described in this note is 90 minutes.   Fonnie Jarvis Pulmonary and Critical Care Medicine [facility] Pager: [phone]  03/09/2013, 8:04 PM

## 2013-03-09 NOTE — ED Provider Notes (Signed)
History     CSN: [id number]  Arrival date & time 03/09/13  1734   First MD Initiated Contact with Patient 03/09/13 1742      Chief Complaint  Patient presents with  . Altered Mental Status    (Consider location/radiation/quality/duration/timing/severity/associated sxs/prior treatment) HPI Comments: 42 y F with PMH of HTN, HLD and T2DM here with altered mental status that has reportedly been worsening since yesterday per EMS report.  EMS reported deplorable living condition with cat urine/feces everywhere.  FSBS in 110s and 2mg  Narcan with no effect so they attempted intubation due to GCS 3.  The pt reportely bit their blade so they just bagged her here.  Patient is a 57 y.o. female presenting with altered mental status. The history is provided by the EMS personnel.  Altered Mental Status This is a new problem. The current episode started yesterday. The problem occurs constantly. The problem has been rapidly worsening. Treatments tried: Narcan - 2 mg. The treatment provided no relief.    Past Medical History  Diagnosis Date  . Arthritis   . Hyperlipidemia   . Hypertension   . Allergy   . GERD (gastroesophageal reflux disease)   . Back pain   . Type II diabetes mellitus   . Schizophrenia     /notes 02/17/2013    Past Surgical History  Procedure Laterality Date  . Tubal ligation      Family History  Problem Relation Age of Onset  . Diabetes Mother   . COPD Mother   . Heart disease Mother   . Hyperlipidemia Mother   . Hypertension Mother   . Arthritis Mother     History  Substance Use Topics  . Smoking status: Current Every Day Smoker -- 1.00 packs/day    Types: Cigarettes  . Smokeless tobacco: Never Used  . Alcohol Use: No    OB History   Grav Para Term Preterm Abortions TAB SAB Ect Mult Living                  Review of Systems  Unable to perform ROS: Patient unresponsive  Psychiatric/Behavioral: Positive for altered mental status.    Allergies    Codeine  Home Medications   Current Outpatient Rx  Name  Route  Sig  Dispense  Refill  . aspirin EC 81 MG tablet   Oral   Take 81 mg by mouth daily.         Marland Kitchen atorvastatin (LIPITOR) 40 MG tablet   Oral   Take 1 tablet (40 mg total) by mouth daily.   90 tablet   3   . benztropine (COGENTIN) 1 MG tablet   Oral   Take 1 mg by mouth at bedtime.         . calcium carbonate (TUMS - DOSED IN MG ELEMENTAL CALCIUM) 500 MG chewable tablet   Oral   Chew 1 tablet (200 mg of elemental calcium total) by mouth 3 (three) times daily with meals.   30 tablet   0   . Docusate Sodium (DSS) 100 MG CAPS   Oral   Take 100 mg by mouth daily as needed.   30 each   0   . fluPHENAZine decanoate (PROLIXIN) 25 MG/ML injection   Intramuscular   Inject 25 mg into the muscle every 14 (fourteen) days. No instruction given         . gabapentin (NEURONTIN) 300 MG capsule      take 1 capsule by mouth three times  a day   270 capsule   3   . gemfibrozil (LOPID) 600 MG tablet   Oral   Take 600 mg by mouth 2 (two) times daily.         . metFORMIN (GLUCOPHAGE) 500 MG tablet   Oral   Take 500-10,000 mg by mouth 2 (two) times daily with a meal. 2 tabs by mouth qam and 1 tab by mouth qhs         . niacin (NIASPAN) 750 MG CR tablet   Oral   Take 750 mg by mouth at bedtime.         . nitroGLYCERIN (NITROSTAT) 0.4 MG SL tablet   Sublingual   Place 1 tablet (0.4 mg total) under the tongue every 5 (five) minutes x 3 doses as needed for chest pain.   5 tablet   0   . oxybutynin (DITROPAN) 5 MG tablet   Oral   Take 5 mg by mouth 2 (two) times daily.           Vitals - Pulse Rate: 96 ; SpO2: 94 % BP: 57/29 mmHg (Device Time: 18:00:00) ; MAP (mmHg): 36 (Device Time: 18:00:00) ; Resp: 14  Temp: 94.3 F (34.6 C) (Device Time: 18:30:01)  Physical Exam  Vitals reviewed. Constitutional:  Unresponsive  HENT:  Right Ear: External ear normal.  Left Ear: External ear normal.   Mouth/Throat: No oropharyngeal exudate.  Eyes: Conjunctivae are normal. Right eye exhibits no discharge. Left eye exhibits no discharge. No scleral icterus.  minimally reactive pupils at 4 mm, equal  Neck: Neck supple. No tracheal deviation present.  Cardiovascular: Regular rhythm, normal heart sounds and intact distal pulses.  Tachycardia present.  Exam reveals no gallop and no friction rub.   No murmur heard. Pulmonary/Chest: Breath sounds normal. She is in respiratory distress.  Requiring BVM assistance on arrival  Abdominal: Soft. Bowel sounds are normal. She exhibits no distension. There is no guarding.  Musculoskeletal: She exhibits no edema.  Neurological: She is unresponsive. GCS eye subscore is 1. GCS verbal subscore is 1. GCS motor subscore is 1.  No response to pain   Skin: Skin is warm and dry. No rash noted.     Psychiatric:  Unable to assess    ED Course  INTUBATION Performed by: Oleh Genin Authorized by: Linwood Dibbles R Consent: The procedure was performed in an emergent situation. Patient identity confirmed: anonymous protocol, patient vented/unresponsive Time out: Immediately prior to procedure a "time out" was called to verify the correct patient, procedure, equipment, support staff and site/side marked as required. Indications: respiratory failure and airway protection Intubation method: video-assisted Patient status: paralyzed (RSI) Preoxygenation: BVM Sedatives: etomidate Paralytic: rocuronium Laryngoscope size: Mac 4 Tube size: 7.5 mm Tube type: cuffed Number of attempts: 1 Cricoid pressure: BURP. Cords visualized: yes (Grade 1) Post-procedure assessment: chest rise,  ETCO2 monitor and CO2 detector Breath sounds: equal Cuff inflated: yes ETT to teeth: 21 cm Tube secured with: ETT holder Chest x-ray interpreted by me. Chest x-ray findings: endotracheal tube in appropriate position Patient tolerance: Patient tolerated the procedure well with no  immediate complications.  CENTRAL LINE Performed by: Oleh Genin Authorized by: Linwood Dibbles R Consent: The procedure was performed in an emergent situation. Patient identity confirmed: anonymous protocol, patient vented/unresponsive Time out: Immediately prior to procedure a "time out" was called to verify the correct patient, procedure, equipment, support staff and site/side marked as required. Indications: vascular access Preparation: skin prepped with 2% chlorhexidine Skin prep  agent dried: skin prep agent completely dried prior to procedure Sterile barriers: all five maximum sterile barriers used - cap, mask, sterile gown, sterile gloves, and large sterile sheet Hand hygiene: hand hygiene performed prior to central venous catheter insertion Location details: left subclavian Site selection rationale: Skin breakdown of groin/femoral area, C collar in place Patient position: Trendelenburg Catheter type: triple lumen Catheter size: 7 Fr Pre-procedure: landmarks identified Number of attempts: 1 Successful placement: yes Post-procedure: line sutured and dressing applied Assessment: blood return through all ports, no pneumothorax on x-ray, free fluid flow and placement verified by x-ray Patient tolerance: Patient tolerated the procedure well with no immediate complications.   (including critical care time)  Labs Reviewed  CBC WITH DIFFERENTIAL - Abnormal; Notable for the following:    WBC 25.4 (*)    RBC 3.56 (*)    Hemoglobin 10.7 (*)    HCT 29.0 (*)    MCHC 36.9 (*)    Platelets 426 (*)    Neutrophils Relative 93 (*)    Neutro Abs 23.4 (*)    Lymphocytes Relative 5 (*)    All other components within normal limits  COMPREHENSIVE METABOLIC PANEL - Abnormal; Notable for the following:    Sodium 108 (*)    Potassium 3.1 (*)    Chloride 72 (*)    CO2 11 (*)    BUN 64 (*)    Creatinine, Ser 8.29 (*)    Calcium 6.0 (*)    Total Protein 5.2 (*)    Albumin 2.1 (*)    AST 233  (*)    ALT 83 (*)    Alkaline Phosphatase 848 (*)    Total Bilirubin 1.7 (*)    GFR calc non Af Amer 5 (*)    GFR calc Af Amer 6 (*)    All other components within normal limits  TROPONIN I - Abnormal; Notable for the following:    Troponin I 1.11 (*)    All other components within normal limits  URINALYSIS, ROUTINE W REFLEX MICROSCOPIC - Abnormal; Notable for the following:    Color, Urine BROWN (*)    APPearance TURBID (*)    Hgb urine dipstick LARGE (*)    Ketones, ur 15 (*)    Protein, ur >300 (*)    Leukocytes, UA LARGE (*)    All other components within normal limits  CK - Abnormal; Notable for the following:    Total CK 8025 (*)    All other components within normal limits  URINE MICROSCOPIC-ADD ON - Abnormal; Notable for the following:    Squamous Epithelial / LPF FEW (*)    Bacteria, UA MANY (*)    All other components within normal limits  POCT I-STAT 3, BLOOD GAS (G3P V) - Abnormal; Notable for the following:    pH, Ven 7.193 (*)    pCO2, Ven 30.5 (*)    pO2, Ven 84.0 (*)    Bicarbonate 11.7 (*)    Acid-base deficit 15.0 (*)    All other components within normal limits  POCT I-STAT, CHEM 8 - Abnormal; Notable for the following:    Sodium 112 (*)    Potassium 3.1 (*)    Chloride 84 (*)    BUN 61 (*)    Creatinine, Ser 8.60 (*)    Calcium, Ion 0.74 (*)    Hemoglobin 11.6 (*)    HCT 34.0 (*)    All other components within normal limits  CULTURE, BLOOD (ROUTINE X 2)  CULTURE,  BLOOD (ROUTINE X 2)  URINE CULTURE  CLOSTRIDIUM DIFFICILE BY PCR  AMMONIA  URINE RAPID DRUG SCREEN (HOSP PERFORMED)  PROCALCITONIN  PROTIME-INR  TSH  T4, FREE  CORTISOL  CREATININE, URINE, RANDOM  NA AND K (SODIUM & POTASSIUM), RAND UR  CG4 I-STAT (LACTIC ACID)  TYPE AND SCREEN  ABO/RH   Dg Chest Portable 1 View  03/09/2013  *RADIOLOGY REPORT*  Clinical Data: Altered mental status, central line placement  PORTABLE CHEST - 1 VIEW  Comparison: Chest radiograph 03/09/2013  Findings:  Endotracheal tube and NG tube are unchanged.  Interval placement of a left central venous line from a  subclavian approach.  The tip is in the mid SVC.  No pneumothorax.  Low lung volumes again demonstrated.  IMPRESSION:  Interval placement of left central venous line without complication.   Original Report Authenticated By: Genevive Bi, M.D.    Dg Chest Portable 1 View  03/09/2013  *RADIOLOGY REPORT*  Clinical Data: Altered mental status  PORTABLE CHEST - 1 VIEW  Comparison: Chest radiograph 02/17/2013  Findings: Endotracheal tube is 2.7 cm from carina.  NG tube is coiled within the gastric body.  Normal cardiac silhouette.  Low lung volumes.  No pulmonary edema.  No pneumothorax.  IMPRESSION:  1. Endotracheal tube 3 cm from carina. 2.  NG tube in stomach.   Original Report Authenticated By: Genevive Bi, M.D.      1. Acute kidney injury   2. Altered mental state  3. Severe sepsis with septic shock   4. UTI (lower urinary tract infection)   5. Acute respiratory failure requiring emergent intubation  6. Acute encephalopathy   7. Anion Gap Metabolic Acidosis  8. Unresponsiveness  9.  Leukocytosis 10.  Hyponatremia 11.  Rhabdomyolysis 12.  Hypokalemia 13. Hypocalcemia   MDM   20 y F with PMH of HTN, HLD and T2DM here with altered mental status that has reportedly been worsening since yesterday per EMS report.  EMS reported deplorable living condition with cat urine/feces everywhere.  FSBS in 110s and 2mg  Narcan with no effect so they attempted intubation due to GCS 3.  The pt reportely bit their blade so they just bagged her here.  On arrival she was noted to have minimally reactive pupils at 4 mm, equal.  No reports of falls but C collar placed due to questionable history.  GCS 3.  No response to pain.  Intubated as above for airway protection.  ETT in satisfactory position on Xray.  She then became hypotensive so aggressive IVF resuscitation initiated and broad spectrum Abx (vanc/zosyn)  ordered.  She had a large liquid BM after intubation, will check C-diff.  Urine very cloudy, concerning for UTI.  She had ecchymosis and skin breakdown of her sacral area and buttocks concerning for prolonged immobilization.  Bedside U/S with no AAA. No pericardial effusion noted.  Differential is broad and includes infectious, cardiac, metabolic, endocrine and renal/liver etiologies.  CBC, CMP, lactate, TSH/T4, cortisol, ammonia, blood and urine Cx, CT head/C spine.  7:39 PM Critical Care consulted.  Left Subclavian CVL placed.  4L NS given.  Levophed ordered.  Urine electrolytes/Cr sent.  Bicarb gtt ordered.  RR increased and Vt decreased.  FiO2 to 50%.  Disposition: Admit  Condition: Critically Ill  Pt seen in conjunction with my attending, Dr. Lynelle Doctor.  Oleh Genin, MD PGY-II [facility] Emergency Medicine Resident         Oleh Genin, MD 03/10/13 [phone]

## 2013-03-09 NOTE — Progress Notes (Addendum)
ANTIBIOTIC CONSULT NOTE - INITIAL  Pharmacy Consult for Vancomycin/Cefepime Indication: septic shock  Allergies  Allergen Reactions  . Codeine     REACTION: itching    Patient Measurements: Height: 5' 4.96" (165 cm) Weight: 149 lb 14.6 oz (68 kg) IBW/kg (Calculated) : 56.91  Vital Signs: Temp: 91.9 F (33.3 C) (05/07 2047) Temp src: Core (Comment) (05/07 2047) BP: 117/61 mmHg (05/07 2047) Pulse Rate: 100 (05/07 2047) Intake/Output from previous day:   Intake/Output from this shift: Total I/O In: 7000 [I.V.:7000] Out: 1050 [Urine:250; Emesis/NG output:400; Stool:400]  Labs:  Recent Labs  03/09/13 1759 03/09/13 1813  WBC 25.4*  --   HGB 10.7* 11.6*  PLT 426*  --   CREATININE 8.29* 8.60*   Estimated Creatinine Clearance: 6.6 ml/min (by C-G formula based on Cr of 8.6). No results found for this basename: VANCOTROUGH, Leodis Binet, VANCORANDOM, GENTTROUGH, GENTPEAK, GENTRANDOM, TOBRATROUGH, TOBRAPEAK, TOBRARND, AMIKACINPEAK, AMIKACINTROU, AMIKACIN,  in the last 72 hours   Microbiology: Recent Results (from the past 720 hour(s))  URINE CULTURE     Status: None   Collection Time    02/17/13  1:18 PM      Result Value Range Status   Specimen Description URINE, CATHETERIZED   Final   Special Requests NONE   Final   Culture  Setup Time 02/17/2013 22:14   Final   Colony Count >=100,000 COLONIES/ML   Final   Culture     Final   Value: Multiple bacterial morphotypes present, none predominant. Suggest appropriate recollection if clinically indicated.   Report Status 02/19/2013 FINAL   Final  CLOSTRIDIUM DIFFICILE BY PCR     Status: Abnormal   Collection Time    02/17/13 11:18 PM      Result Value Range Status   C difficile by pcr POSITIVE (*) NEGATIVE Final   Comment: CRITICAL RESULT CALLED TO, READ BACK BY AND VERIFIED WITH:     MAGBATANG RN 9:45 02/17/13 ([email or web address])    Medical History: Past Medical History  Diagnosis Date  . Arthritis   . Hyperlipidemia   .  Hypertension   . Allergy   . GERD (gastroesophageal reflux disease)   . Back pain   . Type II diabetes mellitus   . Schizophrenia     /notes 02/17/2013    Medications:   (Not in a hospital admission) Assessment: 57 y/o female patient admitted with recent c.diff infection and has been unresponsive for 24 hours showing signs of septic shock requiring broad spectrum antibiotics. Noted acute renal failure, will adjust antibiotics. Received 1g vanc and zosyn 3.375g in ED.  Goal of Therapy:  Vancomycin trough level 10-15 mcg/ml  Plan:  Given renal dysfunction will f/u with random vancomycin level in am and dose accordingly. Begin cefepime 2g iv x1 then 500mg  iv q24h. Will monitor level and renal function. Measure antibiotic drug levels at steady state Follow up culture results  Verlene Mayer, PharmD, BCPS Pager [phone] 03/09/2013,8:55 PM  Addendum Will give additional vancomycin 500 mg IV tonight for total of 1500 mg for loading dose.  F/U renal function and recheck level in 48-72 hours or sooner if improves.  Geannie Risen, PharmD, BCPS 03/09/2013 11:09 PM

## 2013-03-09 NOTE — Progress Notes (Signed)
eL  eLink Physician Progress Note and Electrolyte Replacement  Patient Name: Krista [name] DOB: [birth year] MRN: [id number]  Date of Service  03/09/2013   HPI/Events of Note    Recent Labs Lab 03/09/13 1759 03/09/13 1813 03/09/13 2215  NA 108* 112* 120*  K 3.1* 3.1* 2.7*  CL 72* 84* 91*  CO2 11*  --  8*  GLUCOSE 77 78 150*  BUN 64* 61* 49*  CREATININE 8.29* 8.60* 5.71*  CALCIUM 6.0*  --  4.5*    Estimated Creatinine Clearance: 11.4 ml/min (by C-G formula based on Cr of 5.71).  Intake/Output     05/07 0701 - 05/08 0700   I.V. (mL/kg) 9000 (114.7)   Total Intake(mL/kg) 9000 (114.7)   Urine (mL/kg/hr) 425   Emesis/NG output 400   Stool 400   Total Output 1225   Net +7775        - I/O DETAILED x 24h    Total I/O In: 7000 [I.V.:7000] Out: 1225 [Urine:425; Emesis/NG output:400; Stool:400] - I/O THIS SHIFT    ASSESSMENT  give her 40 mEq via tube potassium chloride  Fluid bolus 2 L because his CVP is only 8  Check ABG stat because she seems to be in  metabolic acidosis despite bicarbonate infusion   eICURN Interventions   see above    ASSESSMENT: MAJOR ELECTROLYTE      Dr. Kalman Shan, M.D., [facility].C.P Pulmonary and Critical Care Medicine Staff Physician [facility] [place] [facility] Pager: [phone], If no answer or between  15:00h - 7:00h: call [phone]  03/09/2013 11:23 PM

## 2013-03-09 NOTE — Progress Notes (Signed)
eLink Physician-Brief Progress Note Patient Name: Krista [name] DOB: 1956-09-04 MRN: [id number]  Date of Service  03/09/2013   HPI/Events of Note   repeat ABG shows a pH of 7.16. This is while the patient is on 3 amps of bicarbonate infusing at 150 cc per hour. Nevertheless it is mild improvement from ABG at 10 PM and P. for some 0.1 on 6   eICU Interventions   continue bicarbonate infusion but will give 1 amp of bicarbonate bolus    Intervention Category Major Interventions: Acid-Base disturbance - evaluation and management  Quianna Avery 03/09/2013, 11:33 PM

## 2013-03-09 NOTE — ED Notes (Signed)
Pt intubated. Good color change. Lungs sounds heard bilaterally. 23 @ the teeth.

## 2013-03-09 NOTE — ED Provider Notes (Signed)
The patient presented to the emergency room with altered mental status. As a level V caveat the patient is unable to communicate with [place].  Family members are not at the bedside. Per EMS report she was found unresponsive today. Family was not able to wake her up. EMS found that her living conditions were very poor and there is very poor hygiene in the household. Physical Exam  BP 82/42  Pulse 95  Temp(Src) 93.6 F (34.2 C)  Resp 22  SpO2 95%  Physical Exam  Constitutional: No distress.  Unresponsive  HENT:  Head: Normocephalic and atraumatic.  Mouth/Throat: Oropharyngeal exudate: membranes dry.  Eyes: Pupils are equal, round, and reactive to light.  Minimal pupil reactivity  Neck: No JVD present.  Cardiovascular: Normal rate, regular rhythm and normal heart sounds.   Pulmonary/Chest: No stridor.  Week respiratory effort  Abdominal: Soft. Bowel sounds are normal. She exhibits no distension. There is no tenderness.  Genitourinary:  Patient incontinent of stool  Musculoskeletal:  Few superficial abrasions on her feet and toes, lower extremities covered with stool  Lymphadenopathy:    She has no cervical adenopathy.  Neurological: GCS eye subscore is 1. GCS verbal subscore is 1. GCS motor subscore is 1.  Skin: She is not diaphoretic.    ED Course  Procedures Intubation and central line placement was performed by the resident physician, Dr. Beverely Pace under my direct supervision MDM Patient's urine sample is extremely cloudy and dark. Suggestive of a urinary tract infection. Patient has a profound metabolic acidosis associated with acute renal failure. IV fluid hydration was initiated.  Broad spectrum antibiotics were ordered. Patient was started on pressors. She'll be admitted to the ICU for further treatment of sepsis and acute renal failure    I reviewed and interpreted the EKG during the patient's evaluation in the ED and agree with the resident's interpretation. I saw and  evaluated the patient, reviewed the resident's note and I agree with the findings and plan.   Celene Kras, MD 03/09/13 [phone]

## 2013-03-09 NOTE — ED Notes (Signed)
Patient going to CT than to 2100 with RT, RN and NT.

## 2013-03-09 NOTE — ED Notes (Signed)
BP continues in 80's systolic.  Dr Kendrick Fries notified.  New orders per Dr Kendrick Fries.

## 2013-03-09 NOTE — ED Notes (Signed)
Per EMS: Pt from home. Per family pt has been unresponsive since 1200 yesterday. Pt found slumped on couch in poor living conditions. Pt had guppy breathing, good color.  Pt bagged en route. Unable to intubate due to gag reflex. BG 110. 126/88. 98 SR. GCS 3.

## 2013-03-09 NOTE — Progress Notes (Signed)
LB PCCM  Since arrival to the ICU, Krista [name] has had worsening acidosis and shock.  We have added a bicarb gtt.    Her family has arrived and is uncertain as to whether or not they think their mother would want Hemodialysis.  At this point it doesn't look like her blood pressure would support it.  We discussed the situation at length and I explained to them that she has multi-organ failure and her likelihood of survival is low.  We will continue aggressive resuscitation, but after discussing with me they have agreed to not perform CPR should she loose a pulse.  We will continue aggressive measures up until that point.  Additional CC time by me 30 minutes.  Yolonda Kida PCCM Pager: [phone] Cell: [phone] If no response, call [phone]

## 2013-03-10 ENCOUNTER — Encounter (HOSPITAL_COMMUNITY): Payer: Self-pay | Admitting: Radiology

## 2013-03-10 ENCOUNTER — Inpatient Hospital Stay (HOSPITAL_COMMUNITY): Payer: Medicare Other

## 2013-03-10 DIAGNOSIS — R109 Unspecified abdominal pain: Secondary | ICD-10-CM

## 2013-03-10 DIAGNOSIS — R404 Transient alteration of awareness: Secondary | ICD-10-CM

## 2013-03-10 DIAGNOSIS — R079 Chest pain, unspecified: Secondary | ICD-10-CM

## 2013-03-10 DIAGNOSIS — E1142 Type 2 diabetes mellitus with diabetic polyneuropathy: Secondary | ICD-10-CM

## 2013-03-10 LAB — CBC WITH DIFFERENTIAL/PLATELET
Basophils Absolute: 0 10*3/uL (ref 0.0–0.1)
Basophils Relative: 0 % (ref 0–1)
Eosinophils Absolute: 0.5 10*3/uL (ref 0.0–0.7)
Eosinophils Relative: 1 % (ref 0–5)
HCT: 30.8 % — ABNORMAL LOW (ref 36.0–46.0)
Hemoglobin: 11.6 g/dL — ABNORMAL LOW (ref 12.0–15.0)
Lymphocytes Relative: 1 % — ABNORMAL LOW (ref 12–46)
Lymphs Abs: 0.5 10*3/uL — ABNORMAL LOW (ref 0.7–4.0)
MCH: 30.4 pg (ref 26.0–34.0)
MCHC: 37.7 g/dL — ABNORMAL HIGH (ref 30.0–36.0)
MCV: 80.6 fL (ref 78.0–100.0)
Monocytes Absolute: 2.3 10*3/uL — ABNORMAL HIGH (ref 0.1–1.0)
Monocytes Relative: 5 % (ref 3–12)
Neutro Abs: 41.9 10*3/uL — ABNORMAL HIGH (ref 1.7–7.7)
Neutrophils Relative %: 93 % — ABNORMAL HIGH (ref 43–77)
Platelets: 462 10*3/uL — ABNORMAL HIGH (ref 150–400)
RBC: 3.82 MIL/uL — ABNORMAL LOW (ref 3.87–5.11)
RDW: 14.3 % (ref 11.5–15.5)
WBC: 45.2 10*3/uL — ABNORMAL HIGH (ref 4.0–10.5)

## 2013-03-10 LAB — BASIC METABOLIC PANEL
BUN: 46 mg/dL — ABNORMAL HIGH (ref 6–23)
BUN: 44 mg/dL — ABNORMAL HIGH (ref 6–23)
CO2: 9 mEq/L — CL (ref 19–32)
CO2: 11 mEq/L — ABNORMAL LOW (ref 19–32)
CO2: 10 mEq/L — CL (ref 19–32)
Calcium: 4 mg/dL — CL (ref 8.4–10.5)
Chloride: 93 mEq/L — ABNORMAL LOW (ref 96–112)
Chloride: 99 mEq/L (ref 96–112)
Chloride: 94 mEq/L — ABNORMAL LOW (ref 96–112)
Chloride: 94 mEq/L — ABNORMAL LOW (ref 96–112)
Creatinine, Ser: 4.75 mg/dL — ABNORMAL HIGH (ref 0.50–1.10)
GFR calc Af Amer: 9 mL/min — ABNORMAL LOW (ref >90)
GFR calc Af Amer: 10 mL/min — ABNORMAL LOW (ref >90)
GFR calc Af Amer: 12 mL/min — ABNORMAL LOW (ref >90)
GFR calc Af Amer: 10 mL/min — ABNORMAL LOW (ref >90)
GFR calc non Af Amer: 9 mL/min — ABNORMAL LOW (ref >90)
GFR calc non Af Amer: 9 mL/min — ABNORMAL LOW (ref >90)
Glucose, Bld: 251 mg/dL — ABNORMAL HIGH (ref 70–99)
Glucose, Bld: 269 mg/dL — ABNORMAL HIGH (ref 70–99)
Potassium: 3.2 mEq/L — ABNORMAL LOW (ref 3.5–5.1)
Potassium: 3.4 mEq/L — ABNORMAL LOW (ref 3.5–5.1)
Potassium: 3.1 mEq/L — ABNORMAL LOW (ref 3.5–5.1)
Potassium: 3 mEq/L — ABNORMAL LOW (ref 3.5–5.1)
Potassium: 3.6 mEq/L (ref 3.5–5.1)
Sodium: 123 mEq/L — ABNORMAL LOW (ref 135–145)
Sodium: 123 mEq/L — ABNORMAL LOW (ref 135–145)
Sodium: 125 mEq/L — ABNORMAL LOW (ref 135–145)

## 2013-03-10 LAB — BLOOD GAS, ARTERIAL
Acid-base deficit: 19.4 mmol/L — ABNORMAL HIGH (ref 0.0–2.0)
Bicarbonate: 11.5 mEq/L — ABNORMAL LOW (ref 20.0–24.0)
Drawn by: 34779
FIO2: 0.8 %
O2 Saturation: 97.3 %
PEEP: 10 cmH2O
Patient temperature: 95.2
RATE: 28 resp/min
pH, Arterial: 7.42 (ref 7.350–7.450)
pO2, Arterial: 84.5 mmHg (ref 80.0–100.0)
pO2, Arterial: 119 mmHg — ABNORMAL HIGH (ref 80.0–100.0)

## 2013-03-10 LAB — POCT I-STAT 3, ART BLOOD GAS (G3+)
Acid-base deficit: 12 mmol/L — ABNORMAL HIGH (ref 0.0–2.0)
Acid-base deficit: 13 mmol/L — ABNORMAL HIGH (ref 0.0–2.0)
Bicarbonate: 9.6 mEq/L — ABNORMAL LOW (ref 20.0–24.0)
Patient temperature: 99.8
TCO2: 8 mmol/L (ref 0–100)
pCO2 arterial: 21.4 mmHg — ABNORMAL LOW (ref 35.0–45.0)
pH, Arterial: 7.158 — CL (ref 7.350–7.450)
pH, Arterial: 7.376 (ref 7.350–7.450)
pO2, Arterial: 93 mmHg (ref 80.0–100.0)

## 2013-03-10 LAB — POCT I-STAT, CHEM 8
BUN: 39 mg/dL — ABNORMAL HIGH (ref 6–23)
Calcium, Ion: 0.73 mmol/L — ABNORMAL LOW (ref 1.12–1.23)
Chloride: 99 mEq/L (ref 96–112)
Chloride: 101 mEq/L (ref 96–112)
HCT: 39 % (ref 36.0–46.0)
Sodium: 128 mEq/L — ABNORMAL LOW (ref 135–145)
TCO2: 10 mmol/L (ref 0–100)

## 2013-03-10 LAB — GLUCOSE, CAPILLARY
Glucose-Capillary: 206 mg/dL — ABNORMAL HIGH (ref 70–99)
Glucose-Capillary: 213 mg/dL — ABNORMAL HIGH (ref 70–99)
Glucose-Capillary: 218 mg/dL — ABNORMAL HIGH (ref 70–99)
Glucose-Capillary: 227 mg/dL — ABNORMAL HIGH (ref 70–99)
Glucose-Capillary: 240 mg/dL — ABNORMAL HIGH (ref 70–99)
Glucose-Capillary: 258 mg/dL — ABNORMAL HIGH (ref 70–99)

## 2013-03-10 LAB — HEPATIC FUNCTION PANEL
ALT: 74 U/L — ABNORMAL HIGH (ref 0–35)
AST: 291 U/L — ABNORMAL HIGH (ref 0–37)
Albumin: 1.2 g/dL — ABNORMAL LOW (ref 3.5–5.2)
Alkaline Phosphatase: 565 U/L — ABNORMAL HIGH (ref 39–117)
Bilirubin, Direct: 1.1 mg/dL — ABNORMAL HIGH (ref 0.0–0.3)
Indirect Bilirubin: 0.2 mg/dL — ABNORMAL LOW (ref 0.3–0.9)
Total Bilirubin: 1.3 mg/dL — ABNORMAL HIGH (ref 0.3–1.2)
Total Protein: 3.3 g/dL — ABNORMAL LOW (ref 6.0–8.3)

## 2013-03-10 LAB — CK: Total CK: 11291 U/L — ABNORMAL HIGH (ref 7–177)

## 2013-03-10 LAB — OSMOLALITY: Osmolality: 272 mOsm/kg — ABNORMAL LOW (ref 275–300)

## 2013-03-10 LAB — T4, FREE: Free T4: 1.1 ng/dL (ref 0.80–1.80)

## 2013-03-10 LAB — LACTIC ACID, PLASMA: Lactic Acid, Venous: 2.5 mmol/L — ABNORMAL HIGH (ref 0.5–2.2)

## 2013-03-10 LAB — LACTATE DEHYDROGENASE: LDH: 591 U/L — ABNORMAL HIGH (ref 94–250)

## 2013-03-10 LAB — TROPONIN I: Troponin I: 2.34 ng/mL (ref <0.30)

## 2013-03-10 LAB — TSH: TSH: 1 u[IU]/mL (ref 0.350–4.500)

## 2013-03-10 MED ORDER — IOHEXOL 300 MG/ML  SOLN
25.0000 mL | INTRAMUSCULAR | Status: AC
  Administered 2013-03-10 (×2): 25 mL via ORAL

## 2013-03-10 MED ORDER — CALCIUM GLUCONATE 10 % IV SOLN
1.0000 g | Freq: Once | INTRAVENOUS | Status: AC
  Administered 2013-03-10: 1 g via INTRAVENOUS
  Filled 2013-03-10: qty 10

## 2013-03-10 MED ORDER — VANCOMYCIN HCL 500 MG IV SOLR
500.0000 mg | Freq: Four times a day (QID) | Status: AC
  Administered 2013-03-10 – 2013-03-12 (×10): 500 mg via RECTAL
  Filled 2013-03-10 (×10): qty 500

## 2013-03-10 MED ORDER — SODIUM CHLORIDE 0.9 % IV SOLN
1.0000 g | Freq: Once | INTRAVENOUS | Status: AC
  Administered 2013-03-10: 1 g via INTRAVENOUS
  Filled 2013-03-10: qty 10

## 2013-03-10 MED ORDER — SODIUM CHLORIDE 0.45 % IV SOLN
INTRAVENOUS | Status: DC
  Administered 2013-03-10: 1000 mL via INTRAVENOUS

## 2013-03-10 MED ORDER — INSULIN ASPART 100 UNIT/ML ~~LOC~~ SOLN
1.0000 [IU] | SUBCUTANEOUS | Status: DC
Start: 2013-03-11 — End: 2013-03-11
  Administered 2013-03-11 (×2): 2 [IU] via SUBCUTANEOUS

## 2013-03-10 MED ORDER — INSULIN GLARGINE 100 UNIT/ML ~~LOC~~ SOLN
10.0000 [IU] | SUBCUTANEOUS | Status: DC
  Administered 2013-03-11: 10 [IU] via SUBCUTANEOUS
  Filled 2013-03-10: qty 0.1

## 2013-03-10 MED ORDER — VANCOMYCIN 50 MG/ML ORAL SOLUTION
500.0000 mg | Freq: Four times a day (QID) | ORAL | Status: DC
  Administered 2013-03-10 – 2013-03-16 (×25): 500 mg via ORAL
  Filled 2013-03-10 (×28): qty 10

## 2013-03-10 MED ORDER — SODIUM CHLORIDE 0.9 % IV SOLN
INTRAVENOUS | Status: DC
  Administered 2013-03-10: 2 [IU]/h via INTRAVENOUS
  Filled 2013-03-10: qty 1

## 2013-03-10 MED ORDER — DEXTROSE 10 % IV SOLN: INTRAVENOUS | Status: DC | PRN

## 2013-03-10 MED ORDER — FAMOTIDINE IN NACL 20-0.9 MG/50ML-% IV SOLN
20.0000 mg | INTRAVENOUS | Status: DC
  Administered 2013-03-10 – 2013-03-13 (×4): 20 mg via INTRAVENOUS
  Filled 2013-03-10 (×5): qty 50

## 2013-03-10 MED ORDER — SODIUM CHLORIDE 0.9 % IV BOLUS (SEPSIS)
2000.0000 mL | Freq: Once | INTRAVENOUS | Status: AC
  Administered 2013-03-10: 1000 mL via INTRAVENOUS

## 2013-03-10 MED ORDER — POTASSIUM CHLORIDE 20 MEQ/15ML (10%) PO LIQD
40.0000 meq | Freq: Once | ORAL | Status: AC
  Administered 2013-03-10: 40 meq
  Filled 2013-03-10: qty 30

## 2013-03-10 MED ORDER — CHLORHEXIDINE GLUCONATE 0.12 % MT SOLN
15.0000 mL | Freq: Two times a day (BID) | OROMUCOSAL | Status: DC
  Administered 2013-03-10 – 2013-03-16 (×13): 15 mL via OROMUCOSAL
  Filled 2013-03-10 (×13): qty 15

## 2013-03-10 MED ORDER — SODIUM CHLORIDE 0.9 % IV BOLUS (SEPSIS)
1000.0000 mL | Freq: Once | INTRAVENOUS | Status: AC
  Administered 2013-03-10: 1000 mL via INTRAVENOUS

## 2013-03-10 MED ORDER — PHENYLEPHRINE HCL 10 MG/ML IJ SOLN
30.0000 ug/min | INTRAVENOUS | Status: DC
  Administered 2013-03-10 (×2): 100 ug/min via INTRAVENOUS
  Administered 2013-03-10: 80 ug/min via INTRAVENOUS
  Filled 2013-03-10 (×3): qty 4

## 2013-03-10 MED ORDER — SODIUM CHLORIDE 0.9 % IV SOLN
2.0000 g | Freq: Once | INTRAVENOUS | Status: AC
  Administered 2013-03-10: 2 g via INTRAVENOUS
  Filled 2013-03-10: qty 20

## 2013-03-10 MED ORDER — SODIUM BICARBONATE 8.4 % IV SOLN
50.0000 meq | Freq: Once | INTRAVENOUS | Status: AC
  Filled 2013-03-10: qty 50

## 2013-03-10 MED ORDER — POTASSIUM CHLORIDE 10 MEQ/50ML IV SOLN
10.0000 meq | INTRAVENOUS | Status: AC
  Administered 2013-03-10 (×2): 10 meq via INTRAVENOUS
  Filled 2013-03-10 (×2): qty 50

## 2013-03-10 MED ORDER — BIOTENE DRY MOUTH MT LIQD
15.0000 mL | Freq: Four times a day (QID) | OROMUCOSAL | Status: DC
  Administered 2013-03-10 – 2013-03-16 (×25): 15 mL via OROMUCOSAL

## 2013-03-10 MED ORDER — PHENYLEPHRINE HCL 10 MG/ML IJ SOLN
30.0000 ug/min | INTRAVENOUS | Status: DC
  Administered 2013-03-10: 110 ug/min via INTRAVENOUS
  Administered 2013-03-10: 50 ug/min via INTRAVENOUS
  Administered 2013-03-10: 80 ug/min via INTRAVENOUS
  Filled 2013-03-10 (×3): qty 2

## 2013-03-10 NOTE — H&P (Addendum)
PULMONARY  / CRITICAL CARE MEDICINE  Name: Krista [name] MRN: [id number] DOB: Sep 16, 1956    ADMISSION DATE:  03/09/2013 CONSULTATION DATE:  03/09/13   REFERRING MD :  Lynelle Doctor PRIMARY SERVICE: PCCM  CHIEF COMPLAINT:  Septic shock   BRIEF PATIENT DESCRIPTION: 57 y/o female with recent c.diff and schizophrenia was brought in to [facility] ED on 5/7 by EMS after her family said she had been unresponsive for 24 hours.  She was in septic shock, AKI, and had diarrhea and a UTI.  SIGNIFICANT EVENTS / STUDIES:  5/7 CT head >>neg acute 5/7 CT neck >>neg acute 5/7 RUQ U/S >>  LINES / TUBES: 5/7 ETT (EDP) >> 5/7 L [place] CVL (EDP) >>  CULTURES: 5/7 blood >> 5/7 urine >> 5/7 c.diff >>  ANTIBIOTICS: 5/7 vanc >> 5/7 zosyn x1 5/7 cefepime >> 5/7 vanc oral >> 5/7 flagyl oral >>  SUBJECTIVE: pressors remain  VITAL SIGNS: Temp:  [91.9 F (33.3 C)-99.1 F (37.3 C)] 99.1 F (37.3 C) (05/08 1000) Pulse Rate:  [43-169] 107 (05/08 1000) Resp:  [0-31] 28 (05/08 1000) BP: (54-117)/(22-83) 100/71 mmHg (05/08 1000) SpO2:  [65 %-100 %] 98 % (05/08 1000) Arterial Line BP: (69-95)/(47-67) 95/67 mmHg (05/08 1000) FiO2 (%):  [50 %-100 %] 100 % (05/08 0821) Weight:  [149 lb 14.6 oz (68 kg)-173 lb 1 oz (78.5 kg)] 173 lb 1 oz (78.5 kg) (05/07 2100) HEMODYNAMICS: CVP:  [7 mmHg-12 mmHg] 10 mmHg VENTILATOR SETTINGS: Vent Mode:  [-] PRVC FiO2 (%):  [50 %-100 %] 100 % Set Rate:  [14 bmp-28 bmp] 28 bmp Vt Set:  [400 mL-550 mL] 550 mL PEEP:  [5 cmH20-10 cmH20] 10 cmH20 Plateau Pressure:  [18 cmH20-24 cmH20] 23 cmH20 INTAKE / OUTPUT: Intake/Output     05/07 0701 - 05/08 0700 05/08 0701 - 05/09 0700   I.V. (mL/kg) 11157.6 (142.1) 769.3 (9.8)   NG/GT 30    IV Piggyback 4470    Total Intake(mL/kg) 15657.6 (199.5) 769.3 (9.8)   Urine (mL/kg/hr) 975 115 (0.5)   Emesis/NG output 400    Stool 400    Total Output 1775 115   Net +13882.6 +654.3        Stool Occurrence  1 x     PHYSICAL EXAMINATION:  Gen:  sedated on vent HEENT: perr, collar wnl PULM: Rhonchi bilaterally unchanged CV: tachy, regular, no mgr, AB: BS hypo, nontender, no hsm Ext: warm, no edema, no clubbing, mottled hands and feet Derm: pallor, non-blanching purpura, and necrotic/sloughing tissue from perineum to mid back completely encompassing buttocks - no changes Neuro: rass -4, some movement left had now   LABS:  Recent Labs Lab 03/09/13 1747  03/09/13 1759 03/09/13 1801 03/09/13 1813  03/09/13 2153 03/09/13 2215 03/09/13 2315 03/10/13 0106 03/10/13 0124 03/10/13 0241 03/10/13 0325 03/10/13 0400 03/10/13 0430 03/10/13 0503  HGB  --   < > 10.7*  --  11.6*  --   --  11.8*  --   --   --  12.9  --   --   --  13.3  WBC  --   --  25.4*  --   --   --   --  39.3*  --   --   --   --   --   --   --   --   PLT  --   --  426*  --   --   --   --  366  --   --   --   --   --   --   --   --  NA  --   < > 108*  --  112*  --   --  120*  --   --  124* 128* 123*  --   --  128*  K  --   < > 3.1*  --  3.1*  --   --  2.7*  --   --  3.2* 3.1* 3.1*  --   --  3.4*  CL  --   < > 72*  --  84*  --   --  91*  --   --  93* 99 93*  --   --  101  CO2  --   < > 11*  --   --   --   --  8*  --   --  9*  --  8*  --   --   --   GLUCOSE  --   < > 77  --  78  --   --  150*  --   --  244* 256* 251*  --   --  276*  BUN  --   < > 64*  --  61*  --   --  49*  --   --  46* 39* 44*  --   --  38*  CREATININE  --   < > 8.29*  --  8.60*  --   --  5.71*  --   --  5.33* 5.20* 5.02*  --   --  5.20*  CALCIUM  --   < > 6.0*  --   --   --   --  4.5*  --   --  4.2*  --  4.0*  --   --   --   AST  --   --  233*  --   --   --   --   --   --   --   --   --  291*  --   --   --   ALT  --   --  83*  --   --   --   --   --   --   --   --   --  74*  --   --   --   ALKPHOS  --   --  848*  --   --   --   --   --   --   --   --   --  565*  --   --   --   BILITOT  --   --  1.7*  --   --   --   --   --   --   --   --   --  1.3*  --   --   --   PROT  --   --  5.2*  --   --    --   --   --   --   --   --   --  3.3*  --   --   --   ALBUMIN  --   --  2.1*  --   --   --   --   --   --   --   --   --  1.2*  --   --   --   APTT  --   --   --   --   --   --   --  34  --   --   --   --   --   --   --   --  INR  --   --  1.20  --   --   --   --  1.37  --   --   --   --   --   --   --   --   LATICACIDVEN  --   --   --   --  1.79  --  1.7  --   --   --   --   --   --  2.4*  --   --   TROPONINI  --   --   --  1.11*  --   --   --   --   --   --   --   --   --   --   --   --   PROCALCITON 7.61  --   --   --   --   --   --  3.60  --   --   --   --   --   --   --   --   PHART  --   --   --   --   --   < >  --   --  7.169* 7.176*  --   --   --   --  7.158*  --   PCO2ART  --   --   --   --   --   < >  --   --  20.3* 21.1*  --   --   --   --  21.4*  --   PO2ART  --   --   --   --   --   < >  --   --  80.2 84.5  --   --   --   --  62.0*  --   < > = values in this interval not displayed.  Recent Labs Lab 03/10/13 0024 03/10/13 0314 03/10/13 0827  GLUCAP 213* 227* 228*    CXR: ETT, CVL in place, no infiltrate  ASSESSMENT / PLAN:  PULMONARY A: Acute respiratory failure due to increased work of breathing from sepsis and inability to protect airway from encephalopathy P:   -full vent support -increase rate 35, remain TV -abg now on pep 10 , goal to 40% if able -pcxr in am   CARDIOVASCULAR A: Septic shock NSTEMI> demand ischaemia P:  -sepsis protocol -levophed/vasopressin -stress dose steroids given refractory shock -consider Echo if survives  -cvp assessment, may need further pos balance -dc weak neo if able  RENAL A:  AKI due to septic shock and rhabdomyolysis Hyponatremia in setting of AKI, profound volume depletion; relatively fast drop in Na considering normal 4/22, fast in house P:   -dc saline STAT, consider some free  watar back with na rate -dc bicarb as likely a contributer to NA rise, (Na bicarb) and will not change outcome with lactic acidosis -follow  bmet q2h for NA with 1/2 NS, may need d5w cvp 10, fluids to 75 cc/hr -goal is Na 10-12 in 24 hrs, back to this direction of goal today  GASTROINTESTINAL A:  Diarrhea, c.diff Elevated Alk phos and AST, biliary disease?  Likely rhabdo Cdiff, r/;o ischemia P:   -see ID -RUQ U/S pending Npo Continue cdiff treatment -may require ct abdo/pelvis, need to discuss with family -assess ldh  HEMATOLOGIC A:  Leukocytosis, from cdiff, dvt prevention P:  Sub q hep Cbc in am   INFECTIOUS A:  Septic shock due  to UTI and c.diff and cellulitis/skin breakdown on buttocks, bowel ischemia P:   -wound care -may need diverting colostomy for skin wound if survives and ex lap? CT? Pending family wishes -vanc > cellulitis -cefepime > UTI -vanc/flagyl > c.diff -if aggressive add IVIG -CT if aggressive Increase oral vanc to 500 -add vanc enema  ENDOCRINE A:  DM2 P:   -monitor glucose closely -stress dose HC -insulin drip if aggresive  NEUROLOGIC A:  Acute encephalopathy from septic shock, concern Na rise (at risk CPD), unlikley neck injury (cant clear yet) P:   -Sedation with fentanyl -no benzo -RASS 0 is goal -see renal management Na  Code status: partial code  TODAY'S SUMMARY: Poor prognosis, MODS, ischemic bowel?, may need Ct , IVIGm, surgery consult, meet family first  I have personally obtained a history, examined the patient, evaluated laboratory and imaging results, formulated the assessment and plan and placed orders. CRITICAL CARE: The patient is critically ill with multiple organ systems failure and requires high complexity decision making for assessment and support, frequent evaluation and titration of therapies, application of advanced monitoring technologies and extensive interpretation of multiple databases. Critical Care Time devoted to patient care services described in this note is 45 minutes.  Exluding any family meetings  Rory Percy J.,MD Pulmonary and Critical  Care Medicine [facility] Pager: [phone]  03/10/2013, 10:11 AM

## 2013-03-10 NOTE — Progress Notes (Signed)
Inpatient Diabetes Program Recommendations  AACE/ADA: New Consensus Statement on Inpatient Glycemic Control  Target Ranges:  Prepandial:   less than 140 mg/dL      Peak postprandial:   less than 180 mg/dL (1-2 hours)      Critically ill patients:  140 - 180 mg/dL  Pager:  [phone] Hours:  8 am-10pm   Reason for Visit: Elevated glucose in 200s-  Family conference today to decide goals of care.  Inpatient Diabetes Program Recommendations Insulin - IV drip/GlucoStabilizer: Spoke with RN; if family decides to continue aggressive treatment, start IV insulin per ICU Hyperglycemia Protocol.  If comfort care measures, discontinue ICU Protocol  Alfredia Client PhD, RN, BC-ADM Diabetes Coordinator  Office:  [phone] Team Pager:  [phone]

## 2013-03-10 NOTE — Progress Notes (Signed)
INITIAL NUTRITION ASSESSMENT  DOCUMENTATION CODES Per approved criteria  -Not Applicable   INTERVENTION: 1. If deemed safe to start enteral nutrition support recommend: Initiate Oxepa @ 10 ml/hr via OG tube and increase by 10 ml every 12 hours to goal rate of 40 ml/hr. 30 ml Prostat TID.  At goal rate, tube feeding regimen will provide 1840 kcal, 120 grams of protein, and 753 ml of H2O.   2. Monitor magnesium, potassium, and phosphorus daily for at least 3 days, MD to replete as needed, as pt is at risk for refeeding syndrome given hx of 50 lb weight loss x 5 months.   NUTRITION DIAGNOSIS: Inadequate oral intake related to inability to eat as evidenced by NPO status.  Goal: Pt to meet >/= 90% of their estimated nutrition needs.   Monitor:  TF tolerance, weight trend, labs, plan of care  Reason for Assessment: Consult received for Nutrition Assessment and TF Recommendations  57 y.o. female  Admitting Dx: Septic Shock  ASSESSMENT: Pt with septic shock per MD due to UTI, c.diff, cellulitis/skin breakdown on buttocks. Bowel ischemia being considered. Pt with AKI/NSTEMI due to septic shock. Pt with MODS with poor prognosis. Per family request no resuscitation if arrest but continue current medical care. Spoke with RN who reports insulin drip started but no CVVHD or other treaments planned.  Pt with DTI to sacrum, back and left groin. Pt was found down at home for unknown period of time. Pt lives with her mom and brother. Family unable to report weight hx or intake hx. Per her brother that does not live with her he feels pt's current condition is because of the 9 cats that reside in the home.  Chart reviewed. Pt weighed approximately 200 lb in 2013 per record review. Suspect malnutrition. Therefore pt would be at risk for refeeding syndrome with the initiation of nutrition support.   Patient is currently intubated on ventilator support. Pt with bear hugger on.   MV: 19.7 Temp:Temp  (24hrs), Avg:95.1 F (35.1 C), Min:91.9 F (33.3 C), Max:99.4 F (37.4 C)   Nutrition Focused Physical Exam:  Subcutaneous Fat:  Orbital Region: WNL Upper Arm Region: WNL Thoracic and Lumbar Region: WNL  Muscle:  Temple Region: WNL Clavicle Bone Region: WNL Clavicle and Acromion Bone Region: WNL Scapular Bone Region: NA Dorsal Hand: WNL Patellar Region: WNL Anterior Thigh Region: WNL Posterior Calf Region: NA  Edema: 2+ per RN assessment    Height: Ht Readings from Last 1 Encounters:  03/09/13 5' 4.96" (1.65 m)    Weight: 68 kg on admission 5/7 Wt Readings from Last 1 Encounters:  03/09/13 173 lb 1 oz (78.5 kg)    Ideal Body Weight: 56.8 kg  % Ideal Body Weight: 120%  Wt Readings from Last 10 Encounters:  03/09/13 173 lb 1 oz (78.5 kg)  02/22/13 150 lb 5.7 oz (68.2 kg)  02/02/13 159 lb 2.8 oz (72.2 kg)  01/23/13 168 lb 10.4 oz (76.5 kg)  11/17/12 191 lb (86.637 kg)  10/15/12 205 lb (92.987 kg)  10/07/12 206 lb (93.441 kg)  09/10/12 202 lb (91.627 kg)  07/30/12 207 lb (93.895 kg)  02/12/12 211 lb (95.709 kg)    Usual Body Weight: 200 lb 2013  % Usual Body Weight: 75%  BMI:  Body mass index is 28.83 kg/(m^2). Overweight  Estimated Nutritional Needs: Kcal: 1867 Protein: 102-125 Fluid: >2 L/day  Skin: DTI to sacrum, back and left groin.  Diet Order:   NPO  EDUCATION NEEDS: -No  education needs identified at this time   Intake/Output Summary (Last 24 hours) at 03/10/13 1128 Last data filed at 03/10/13 1100  Gross per 24 hour  Intake 16537.15 ml  Output   1905 ml  Net 14632.15 ml    Last BM: 5/8   Labs:   Recent Labs Lab 03/09/13 2215 03/10/13 0124 03/10/13 0241 03/10/13 0325 03/10/13 0503  NA 120* 124* 128* 123* 128*  K 2.7* 3.2* 3.1* 3.1* 3.4*  CL 91* 93* 99 93* 101  CO2 8* 9*  --  8*  --   BUN 49* 46* 39* 44* 38*  CREATININE 5.71* 5.33* 5.20* 5.02* 5.20*  CALCIUM 4.5* 4.2*  --  4.0*  --   GLUCOSE 150* 244* 256* 251* 276*     CBG (last 3)   Recent Labs  03/10/13 0024 03/10/13 0314 03/10/13 0827  GLUCAP 213* 227* 228*   Lab Results  Component Value Date   HGBA1C 6.6* 01/31/2013   Scheduled Meds: . antiseptic oral rinse  15 mL Mouth Rinse QID  . ceFEPime (MAXIPIME) IV  500 mg Intravenous Q24H  . chlorhexidine  15 mL Mouth Rinse BID  . famotidine (PEPCID) IV  20 mg Intravenous Q24H  . heparin  5,000 Units Subcutaneous Q8H  . hydrocortisone sodium succinate  50 mg Intravenous Q6H  . metronidazole  500 mg Intravenous Q8H  . vancomycin  500 mg Oral Q6H  . vancomycin (VANCOCIN) rectal ENEMA  500 mg Rectal Q6H    Continuous Infusions: . sodium chloride    . fentaNYL infusion INTRAVENOUS Stopped (03/09/13 2356)  . insulin (NOVOLIN-R) infusion    . norepinephrine (LEVOPHED) Adult infusion 20 mcg/min (03/10/13 1100)  . phenylephrine (NEO-SYNEPHRINE) Adult infusion 100 mcg/min (03/10/13 1100)  . vasopressin (PITRESSIN) infusion - *FOR SHOCK* 0.03 Units/min (03/10/13 0800)    Past Medical History  Diagnosis Date  . Arthritis   . Hyperlipidemia   . Hypertension   . Allergy   . GERD (gastroesophageal reflux disease)   . Back pain   . Type II diabetes mellitus   . Schizophrenia     /notes 02/17/2013    Past Surgical History  Procedure Laterality Date  . Tubal ligation      Kendell Bane RD, LDN, CNSC [phone] Pager [phone] After Hours Pager

## 2013-03-10 NOTE — Progress Notes (Signed)
CRITICAL VALUE ALERT  Critical value received: K+ 3.0, Troponin 2.34, Ca+ 5.0  Date of notification:  03/10/2013  Time of notification:  7:42 PM   Critical value read back:yes  Nurse who received alert:  Carlis Abbott  MD notified (1st page):  Dr. Blima Dessert  Time of first page:  1630  MD notified (2nd page):  Time of second page:  Responding MD:  Dr. Herma Carson  Time MD responded:  1630

## 2013-03-10 NOTE — Progress Notes (Signed)
eLink Physician-Brief Progress Note Patient Name: Krista [name] DOB: August 21, 1956 MRN: [id number]  Date of Service  03/10/2013   HPI/Events of Note    Recent Labs Lab 03/09/13 2025 03/09/13 2148 03/09/13 2315 03/09/13 2339 03/10/13 0106 03/10/13 0241 03/10/13 0430 03/10/13 0503  PHART 7.070* 7.116* 7.169*  --  7.176*  --  7.158*  --   PCO2ART 28.3* 29.2* 20.3*  --  21.1*  --  21.4*  --   PO2ART 81.0 62.0* 80.2  --  84.5  --  62.0*  --   HCO3 8.2* 9.4* 7.5*  --  7.8*  --  7.7*  --   TCO2 9 10 8.2  --  8.5 10 8 10   O2SAT 90.0 83.0 97.2 90.2 97.3  --  87.0  --     Recent Labs Lab 03/09/13 1759  03/09/13 2215 03/10/13 0124 03/10/13 0241 03/10/13 0325 03/10/13 0503  NA 108*  < > 120* 124* 128* 123* 128*  K 3.1*  < > 2.7* 3.2* 3.1* 3.1* 3.4*  CL 72*  < > 91* 93* 99 93* 101  CO2 11*  --  8* 9*  --  8*  --   GLUCOSE 77  < > 150* 244* 256* 251* 276*  BUN 64*  < > 49* 46* 39* 44* 38*  CREATININE 8.29*  < > 5.71* 5.33* 5.20* 5.02* 5.20*  CALCIUM 6.0*  --  4.5* 4.2*  --  4.0*  --   < > = values in this interval not displayed.   Total I/O In: 12807.3 [I.V.:8417.3; NG/GT:30; IV Piggyback:4360] Out: 1550 [Urine:750; Emesis/NG output:400; Stool:400]   eICU Interventions  Making urine 30cc/h per RN. Acidosis was improving but ? Now trending down (all above on 150cc bicarb gtt)  Plan - 1 amp bicarb bolus - correct k and calcium  - likely needs cvvh   Intervention Category Major Interventions: Acid-Base disturbance - evaluation and management;Electrolyte abnormality - evaluation and management  Ardys Hataway 03/10/2013, 5:17 AM

## 2013-03-10 NOTE — Progress Notes (Signed)
eLink Physician-Brief Progress Note Patient Name: Krista [name] DOB: [birth year] MRN: [id number]  Date of Service  03/10/2013   HPI/Events of Note   According to the nurse daughter has refused CVVH catheter and will be in at 8:30 in morning to discuss goals of care. Basis of refusal was substituted judgment   eICU Interventions     Intervention Category Major Interventions: Acid-Base disturbance - evaluation and management  Gerome Kokesh 03/10/2013, 6:11 AM

## 2013-03-10 NOTE — Progress Notes (Addendum)
CRITICAL VALUE ALERT  Critical value received: cal 5.4, co2 10  Date of notification:  03/10/13  MD notified (1st page):  2150  Responding MD:  Dr. Herma Carson   1g cal ordered

## 2013-03-10 NOTE — Consult Note (Signed)
WOC consult Note Reason for Consult: Deep tissue injury to sacrum,  Back, and left groin.  Pt was found down at home for unknown period of time. Wound type: Extensive Deep tissue injury areas Pressure Ulcer POA: Yes Measurement: L groin 12cm X 12cm   L abdominal fold 12cm X 5cm   Sacrum 22cm X 22cm   Mid back 28cm X 10cm    L thigh 12cm X 5.5cm Wound bed: Skin is a deep purplish color. some areas are beginning to peel and weep a serous fluid and have evolved into partial thickness wounds. Drainage (amount, consistency, odor): Small amt of serous drainage in places, no odor Periwound: Intact Dressing procedure/placement/frequency: Foam dressing placed to cover peeling areas.  Barrier cream applied to sacrum.  Will remove foam dressings and use barrier cream to protect wounds if patient has frequent loose stools. Pt is high risk to evolve into extensive areas of necrotic tissue which might require surgical debridement. She has multiple systemic factors which can impair healing; incontinent of C diff stool and unable to keep wound areas from soiling, immobility, decreased albumin 1.2, decreased Hgb, vasopressors, multisystem organ failure. There is currently no topical treatment recommended as best practice for DTI.  On Sport low air loss bed to reduce pressure. Educational handout left in room regarding pressure ulcer information. No family present to discuss pressure ulcer etiology, topical treatment, and preventive measures.  Pt unresponsive.   Norva Karvonen RN, MSN student Cammie Mcgee MSN, RN, [place], [place], CNS [phone]

## 2013-03-10 NOTE — Procedures (Signed)
Central Venous Catheter Insertion Procedure Note Krista [name] [id number] Mar 01, 1956  Procedure: Insertion of Central Venous Catheter Indications: Assessment of intravascular volume, Drug and/or fluid administration and previous CVL dislodges during transport to CT  Procedure Details Consent: Risks of procedure as well as the alternatives and risks of each were explained to the (patient/caregiver).  Consent for procedure obtained. Time Out: Verified patient identification, verified procedure, site/side was marked, verified correct patient position, special equipment/implants available, medications/allergies/relevent history reviewed, required imaging and test results available.  Performed  Maximum sterile technique was used including antiseptics, cap, gloves, gown, hand hygiene, mask and sheet. Skin prep: Chlorhexidine; local anesthetic administered A antimicrobial bonded/coated triple lumen catheter was placed in the right internal jugular vein using the Seldinger technique.  Evaluation Blood flow good Complications: No apparent complications Patient did tolerate procedure well. Chest X-ray ordered to verify placement.  CXR: pending.   Ultrasound used for site verification, live visualisation of needle entry & guidewire prior to dilation   Krista Viviano V.

## 2013-03-10 NOTE — Progress Notes (Signed)
eLink Physician Progress Note and Electrolyte Replacement  Patient Name: Krista [name] DOB: 1956/01/23 MRN: [id number]  Date of Service  03/10/2013   HPI/Events of Note   10 L of IV fluids so far. CVP still at 8  Recent Labs Lab 03/09/13 1759 03/09/13 1813 03/09/13 2215 03/10/13 0241  NA 108* 112* 120* 128*  K 3.1* 3.1* 2.7* 3.1*  CL 72* 84* 91* 99  CO2 11*  --  8*  --   GLUCOSE 77 78 150* 256*  BUN 64* 61* 49* 39*  CREATININE 8.29* 8.60* 5.71* 5.20*  CALCIUM 6.0*  --  4.5*  --     Estimated Creatinine Clearance: 12.5 ml/min (by C-G formula based on Cr of 5.2).  Intake/Output     05/07 0701 - 05/08 0700   I.V. (mL/kg) 9000 (114.7)   IV Piggyback 2100   Total Intake(mL/kg) 11100 (141.4)   Urine (mL/kg/hr) 750   Emesis/NG output 400   Stool 400   Total Output 1550   Net +9550        - I/O DETAILED x 24h    Total I/O In: 9100 [I.V.:7000; IV Piggyback:2100] Out: 1550 [Urine:750; Emesis/NG output:400; Stool:400] - I/O THIS SHIFT   Recent Labs Lab 03/09/13 1812  03/09/13 2025 03/09/13 2148 03/09/13 2315 03/09/13 2339 03/10/13 0106 03/10/13 0241  PHART  --   --  7.070* 7.116* 7.169*  --  7.176*  --   PCO2ART  --   --  28.3* 29.2* 20.3*  --  21.1*  --   PO2ART  --   --  81.0 62.0* 80.2  --  84.5  --   HCO3 11.7*  --  8.2* 9.4* 7.5*  --  7.8*  --   TCO2 13  < > 9 10 8.2  --  8.5 10  O2SAT 94.0  --  90.0 83.0 97.2 90.2 97.3  --   < > = values in this interval not displayed.   ASSESSMENT - slowly improving renal failure : She has received 10 L of IV fluids so far and CVP still at 8  -Slowly improving acidosis (continues on bicarb gtt) -Hyp0kalemia persistent  -Suspect still volume depleted despite receiving 10 L of IV fluids   eICURN Interventions  1. 2 more liter of saline bolus  2. calcium gluconate IV  3. KCl 40 mEq via tube  4. continue bicarbonate infusion  5. Recheck labs - hold off cvvh   ASSESSMENT: MAJOR ELECTROLYTE      Dr. Kalman Shan, M.D., [facility].C.P Pulmonary and Critical Care Medicine Staff Physician [place] [place] [facility] Pager: [phone], If no answer or between  15:00h - 7:00h: call [phone]  03/10/2013 2:51 AM

## 2013-03-10 NOTE — Progress Notes (Signed)
I have had extensive discussions with family . We discussed patients current circumstances and organ failures. We also discussed patient's prior wishes under circumstances such as this. Family has decided to NOT perform resuscitation if arrest but to continue current medical support for now. Would ant to support, cvvhd, ct for now. To note she was reasonably functional prior to this.  Mcarthur Rossetti. Tyson Alias, MD, FACP Pgr: [phone] [place] [facility]  45 min

## 2013-03-11 ENCOUNTER — Inpatient Hospital Stay (HOSPITAL_COMMUNITY): Payer: Medicare Other

## 2013-03-11 DIAGNOSIS — I517 Cardiomegaly: Secondary | ICD-10-CM

## 2013-03-11 DIAGNOSIS — A419 Sepsis, unspecified organism: Secondary | ICD-10-CM

## 2013-03-11 HISTORY — DX: Sepsis, unspecified organism: A41.9

## 2013-03-11 LAB — POCT I-STAT 3, ART BLOOD GAS (G3+)
Acid-base deficit: 13 mmol/L — ABNORMAL HIGH (ref 0.0–2.0)
O2 Saturation: 99 %
TCO2: 11 mmol/L (ref 0–100)
pCO2 arterial: 19.3 mmHg — CL (ref 35.0–45.0)

## 2013-03-11 LAB — BASIC METABOLIC PANEL
CO2: 12 mEq/L — ABNORMAL LOW (ref 19–32)
CO2: 11 mEq/L — ABNORMAL LOW (ref 19–32)
CO2: 14 mEq/L — ABNORMAL LOW (ref 19–32)
Calcium: 4.6 mg/dL — CL (ref 8.4–10.5)
Calcium: 5.4 mg/dL — CL (ref 8.4–10.5)
Chloride: 95 mEq/L — ABNORMAL LOW (ref 96–112)
Chloride: 94 mEq/L — ABNORMAL LOW (ref 96–112)
Creatinine, Ser: 4.83 mg/dL — ABNORMAL HIGH (ref 0.50–1.10)
Glucose, Bld: 186 mg/dL — ABNORMAL HIGH (ref 70–99)
Potassium: 3.5 mEq/L (ref 3.5–5.1)
Sodium: 124 mEq/L — ABNORMAL LOW (ref 135–145)
Sodium: 123 mEq/L — ABNORMAL LOW (ref 135–145)

## 2013-03-11 LAB — COMPREHENSIVE METABOLIC PANEL
AST: 233 U/L — ABNORMAL HIGH (ref 0–37)
Albumin: 1.4 g/dL — ABNORMAL LOW (ref 3.5–5.2)
Alkaline Phosphatase: 533 U/L — ABNORMAL HIGH (ref 39–117)
BUN: 46 mg/dL — ABNORMAL HIGH (ref 6–23)
Calcium: 5 mg/dL — CL (ref 8.4–10.5)
Chloride: 95 mEq/L — ABNORMAL LOW (ref 96–112)
Creatinine, Ser: 4.92 mg/dL — ABNORMAL HIGH (ref 0.50–1.10)
Creatinine, Ser: 4.99 mg/dL — ABNORMAL HIGH (ref 0.50–1.10)
GFR calc Af Amer: 10 mL/min — ABNORMAL LOW (ref >90)
Glucose, Bld: 203 mg/dL — ABNORMAL HIGH (ref 70–99)
Potassium: 3.5 mEq/L (ref 3.5–5.1)
Total Bilirubin: 0.7 mg/dL (ref 0.3–1.2)
Total Protein: 3.8 g/dL — ABNORMAL LOW (ref 6.0–8.3)

## 2013-03-11 LAB — GLUCOSE, CAPILLARY
Glucose-Capillary: 152 mg/dL — ABNORMAL HIGH (ref 70–99)
Glucose-Capillary: 146 mg/dL — ABNORMAL HIGH (ref 70–99)
Glucose-Capillary: 113 mg/dL — ABNORMAL HIGH (ref 70–99)
Glucose-Capillary: 187 mg/dL — ABNORMAL HIGH (ref 70–99)
Glucose-Capillary: 207 mg/dL — ABNORMAL HIGH (ref 70–99)
Glucose-Capillary: 185 mg/dL — ABNORMAL HIGH (ref 70–99)

## 2013-03-11 LAB — CBC WITH DIFFERENTIAL/PLATELET
Basophils Absolute: 0 10*3/uL (ref 0.0–0.1)
Basophils Relative: 0 % (ref 0–1)
HCT: 32.1 % — ABNORMAL LOW (ref 36.0–46.0)
Hemoglobin: 11.9 g/dL — ABNORMAL LOW (ref 12.0–15.0)
Lymphocytes Relative: 3 % — ABNORMAL LOW (ref 12–46)
Monocytes Relative: 3 % (ref 3–12)
Neutro Abs: 30.5 10*3/uL — ABNORMAL HIGH (ref 1.7–7.7)
RBC: 3.94 MIL/uL (ref 3.87–5.11)
RDW: 14.5 % (ref 11.5–15.5)
WBC: 32.5 10*3/uL — ABNORMAL HIGH (ref 4.0–10.5)

## 2013-03-11 LAB — AMYLASE: Amylase: 40 U/L (ref 0–105)

## 2013-03-11 LAB — LACTATE DEHYDROGENASE: LDH: 564 U/L — ABNORMAL HIGH (ref 94–250)

## 2013-03-11 LAB — LACTIC ACID, PLASMA: Lactic Acid, Venous: 1.3 mmol/L (ref 0.5–2.2)

## 2013-03-11 MED ORDER — SODIUM CHLORIDE 0.9 % IV SOLN
1.0000 g | Freq: Once | INTRAVENOUS | Status: AC
  Administered 2013-03-12: 1 g via INTRAVENOUS
  Filled 2013-03-11: qty 10

## 2013-03-11 MED ORDER — SODIUM CHLORIDE 0.9 % IV SOLN
1.0000 g | Freq: Once | INTRAVENOUS | Status: AC
  Administered 2013-03-11: 1 g via INTRAVENOUS
  Filled 2013-03-11: qty 10

## 2013-03-11 MED ORDER — STERILE WATER FOR INJECTION IV SOLN
INTRAVENOUS | Status: DC
  Administered 2013-03-11 – 2013-03-14 (×6): via INTRAVENOUS
  Filled 2013-03-11 (×10): qty 850

## 2013-03-11 MED ORDER — INSULIN ASPART 100 UNIT/ML ~~LOC~~ SOLN
0.0000 [IU] | SUBCUTANEOUS | Status: DC
  Administered 2013-03-11: 5 [IU] via SUBCUTANEOUS
  Administered 2013-03-11 – 2013-03-12 (×4): 3 [IU] via SUBCUTANEOUS
  Administered 2013-03-12 (×2): 2 [IU] via SUBCUTANEOUS
  Administered 2013-03-12: 3 [IU] via SUBCUTANEOUS
  Administered 2013-03-13 (×4): 2 [IU] via SUBCUTANEOUS
  Administered 2013-03-14: 3 [IU] via SUBCUTANEOUS
  Administered 2013-03-14: 2 [IU] via SUBCUTANEOUS
  Administered 2013-03-14: 3 [IU] via SUBCUTANEOUS
  Administered 2013-03-15 (×2): 2 [IU] via SUBCUTANEOUS
  Administered 2013-03-15: 3 [IU] via SUBCUTANEOUS
  Administered 2013-03-15 – 2013-03-16 (×3): 2 [IU] via SUBCUTANEOUS
  Administered 2013-03-16: 3 [IU] via SUBCUTANEOUS

## 2013-03-11 MED ORDER — CIPROFLOXACIN IN D5W 400 MG/200ML IV SOLN
400.0000 mg | INTRAVENOUS | Status: DC
  Administered 2013-03-11 – 2013-03-16 (×6): 400 mg via INTRAVENOUS
  Filled 2013-03-11 (×6): qty 200

## 2013-03-11 MED ORDER — ASPIRIN 81 MG PO CHEW
81.0000 mg | CHEWABLE_TABLET | Freq: Every day | ORAL | Status: DC
  Administered 2013-03-11 – 2013-03-16 (×6): 81 mg
  Filled 2013-03-11 (×6): qty 1

## 2013-03-11 MED ORDER — VANCOMYCIN HCL IN DEXTROSE 1-5 GM/200ML-% IV SOLN
1000.0000 mg | INTRAVENOUS | Status: DC
  Filled 2013-03-11: qty 200

## 2013-03-11 MED ORDER — MAGNESIUM SULFATE 40 MG/ML IJ SOLN
2.0000 g | Freq: Once | INTRAMUSCULAR | Status: AC
  Administered 2013-03-11: 2 g via INTRAVENOUS
  Filled 2013-03-11: qty 50

## 2013-03-11 NOTE — Progress Notes (Signed)
PULMONARY  / CRITICAL CARE MEDICINE  Name: Krista [name] MRN: [id number] DOB: July 20, 1956    ADMISSION DATE:  03/09/2013 CONSULTATION DATE:  03/09/13   REFERRING MD :  Lynelle Doctor PRIMARY SERVICE: PCCM  CHIEF COMPLAINT:  Septic shock   BRIEF PATIENT DESCRIPTION: 57 y/o female with recent c.diff and schizophrenia was brought in to [facility] ED on 5/7 by EMS after her family said she had been unresponsive for 24 hours.  She was in septic shock, AKI, and had diarrhea and a UTI.  SIGNIFICANT EVENTS / STUDIES:  5/7 CT head >>neg acute 5/7 CT neck >>neg acute 5/8 RUQ U/S >>chronic medical renal disease, Trace bilateral perinephric fluid 5/8 CT abdo/pelvis>>>Increased abdominal ascites and mesenteric edema. Inflammatory changes surround the pancreas. Question pancreatitis. New bilateral pleural effusions, right greater than left, with near total collapse of the right lower lobe. Multiple thoracolumbar compression fractures are stable. 5/8 Ct >>>neg edema, acute  LINES / TUBES: 5/7 ETT (EDP) >> 5/7 L [place] CVL (EDP) >>5/8 transfer out 5/8 rt IJ>>>  CULTURES: 5/7 blood >>>gram neg rod>>> 5/7 urine >> 5/7 c.diff >>POS  ANTIBIOTICS: 5/7 vanc >>5/9 5/7 zosyn x1 5/7 cefepime >> 5/7 vanc oral >> 5/7 flagyl oral >> 5/8 enema vanc>>>plan stop 10th  SUBJECTIVE: improved pressors, improved renal fxn, improved movement  VITAL SIGNS: Temp:  [98.8 F (37.1 C)-100.3 F (37.9 C)] 98.8 F (37.1 C) (05/09 0630) Pulse Rate:  [26-107] 92 (05/09 0740) Resp:  [23-35] 26 (05/09 0740) BP: (58-114)/(21-87) 107/68 mmHg (05/09 0740) SpO2:  [54 %-100 %] 95 % (05/09 0740) Arterial Line BP: (63-102)/(47-71) 102/71 mmHg (05/08 1500) FiO2 (%):  [60 %-100 %] 60 % (05/09 0740) Weight:  [193 lb 2 oz (87.6 kg)] 193 lb 2 oz (87.6 kg) (05/08 2300) HEMODYNAMICS: CVP:  [4 mmHg-15 mmHg] 4 mmHg VENTILATOR SETTINGS: Vent Mode:  [-] PRVC FiO2 (%):  [60 %-100 %] 60 % Set Rate:  [26 bmp-35 bmp] 26 bmp Vt Set:  [550 mL] 550  mL PEEP:  [10 cmH20] 10 cmH20 Plateau Pressure:  [23 cmH20-29 cmH20] 27 cmH20 INTAKE / OUTPUT: Intake/Output     05/08 0701 - 05/09 0700 05/09 0701 - 05/10 0700   I.V. (mL/kg) 2817.2 (32.2)    Other 400    NG/GT 850    IV Piggyback 820    Total Intake(mL/kg) 4887.2 (55.8)    Urine (mL/kg/hr) 538 (0.3)    Emesis/NG output     Stool 600 (0.3)    Total Output 1138     Net +3749.2          Stool Occurrence 1 x      PHYSICAL EXAMINATION:  Gen: grimacing, moving all ext HEENT: perr, collar wnl PULM: Rhonchi bilaterally unchanged CV: s1 s2 rrr AB: BS increased, nontender, no hsm Ext: warm, increased edema 3 plus Neuro: rt 3 mm , left 4 mm pupil, reactive, slight weakness rt with spont movement, not following commands  LABS:  Recent Labs Lab 03/09/13 1747  03/09/13 1759 03/09/13 1801  03/09/13 2153 03/09/13 2215  03/10/13 0325 03/10/13 0400  03/10/13 0503 03/10/13 1105  03/10/13 1311 03/10/13 1454 03/10/13 1600 03/10/13 1751 03/10/13 1800 03/11/13 0435 03/11/13 0612  HGB  --   --  10.7*  --   < >  --  11.8*  < >  --   --   --  13.3 11.6*  --   --   --   --   --   --  11.9*  --  WBC  --   < > 25.4*  --   --   --  39.3*  --   --   --   --   --  45.2*  --   --   --   --   --   --  32.5*  --   PLT  --   < > 426*  --   --   --  366  --   --   --   --   --  462*  --   --   --   --   --   --  366  --   NA  --   --  108*  --   < >  --  120*  < > 123*  --   --  128* 127*  < >  --  123* 124*  --  125* 123*  --   K  --   --  3.1*  --   < >  --  2.7*  < > 3.1*  --   --  3.4* 3.4*  < >  --  3.1* 3.0*  --  3.6 3.6  --   CL  --   --  72*  --   < >  --  91*  < > 93*  --   --  101 99  < >  --  94* 94*  --  94* 95*  --   CO2  --   < > 11*  --   --   --  8*  < > 8*  --   --   --  11*  < >  --  11* 13*  --  10* 10*  --   GLUCOSE  --   --  77  --   < >  --  150*  < > 251*  --   --  276* 287*  < >  --  269* 230*  --  169* 203*  --   BUN  --   --  64*  --   < >  --  49*  < > 44*  --   --   38* 42*  < >  --  44* 44*  --  44* 46*  --   CREATININE  --   --  8.29*  --   < >  --  5.71*  < > 5.02*  --   --  5.20* 4.45*  < >  --  4.73* 4.94*  --  4.75* 4.92*  --   CALCIUM  --   < > 6.0*  --   --   --  4.5*  < > 4.0*  --   --   --  4.0*  < >  --  5.0* 5.1*  --  5.4* 5.0*  --   AST  --   --  233*  --   --   --   --   --  291*  --   --   --   --   --   --   --   --   --   --  292*  --   ALT  --   --  83*  --   --   --   --   --  74*  --   --   --   --   --   --   --   --   --   --  82*  --   ALKPHOS  --   --  848*  --   --   --   --   --  565*  --   --   --   --   --   --   --   --   --   --  542*  --   BILITOT  --   --  1.7*  --   --   --   --   --  1.3*  --   --   --   --   --   --   --   --   --   --  0.7  --   PROT  --   --  5.2*  --   --   --   --   --  3.3*  --   --   --   --   --   --   --   --   --   --  3.8*  --   ALBUMIN  --   --  2.1*  --   --   --   --   --  1.2*  --   --   --   --   --   --   --   --   --   --  1.4*  --   APTT  --   --   --   --   --   --  34  --   --   --   --   --   --   --   --   --   --   --   --   --   --   INR  --   --  1.20  --   --   --  1.37  --   --   --   --   --   --   --   --   --   --   --   --   --   --   LATICACIDVEN  --   --   --   --   < > 1.7  --   --   --  2.4*  --   --  2.5*  --   --   --   --   --   --   --   --   TROPONINI  --   --   --  1.11*  --   --   --   --   --   --   --   --   --   --   --  2.34*  --   --   --   --   --   PROCALCITON 7.61  --   --   --   --   --  3.60  --   --   --   --   --   --   --   --   --   --   --   --   --   --   PHART  --   --   --   --   < >  --   --   < >  --   --   < >  --   --   < > 7.376  --   --  7.323*  --   --  7.329*  PCO2ART  --   --   --   --   < >  --   --   < >  --   --   < >  --   --   < > 17.8*  --   --  18.7*  --   --  19.3*  PO2ART  --   --   --   --   < >  --   --   < >  --   --   < >  --   --   < > 79.0*  --   --  93.0  --   --  148.0*  < > = values in this interval not displayed.  Recent  Labs Lab 03/10/13 2341 03/11/13 0046 03/11/13 0151 03/11/13 0408 03/11/13 0808  GLUCAP 92 114* 161* 187* 196*    CXR: ETT, CVL in place, bibasilar effusions  ASSESSMENT / PLAN:  PULMONARY A: Acute respiratory failure due to increased work of breathing from sepsis and inability to protect airway from encephalopathy P:   -abg reviewed, can keep same MV for now -pcxr with effusions, cvp 10 -plenty of room to reduce to goal 50% peep 5 -If to peep 5, consider sbt -Consider reduction rate fluids, see effusions  CARDIOVASCULAR A: Septic shock NSTEMI> demand ischaemia P:  -sepsis protocol -levophed/vasopressin, improved -stress dose steroids given refractory shock, dc cortisol 73 -consider Echo as clinically improved  RENAL A:  AKI due to septic shock and rhabdomyolysis Hyponatremia in setting of AKI, profound volume depletion; relatively fast drop -hypocalcemia P:   -no hydro -keep 1/2 NS, seee Na rise last 24 hrs, may go back to nacl in am  -bmet to q12h -repeat calcium gluconate, needs even after correction albumin -with rhabdo , may consider lasix with volume  GASTROINTESTINAL A:  C.diff colitis Elevated Alk phos and AST, biliary disease?  Likely rhabdo CT without ischemia  AG and NONAG present acidosis P:   -see ID -add bicarb for NON AG (stool) -mesenteric edema, maintain NPO x 24 hrs further then consider T F -hypocalc, pancreatic edema? from ischemia?, assess amy, lip -repeat lactic acid -avoid ppi cdiff, pepcid  HEMATOLOGIC A:  Leukocytosis, from cdiff, dvt prevention P:  Sub q hep Cbc in am   INFECTIOUS A:  Gram neg Septic shock due to UTI and c.diff and cellulitis/skin breakdown on buttocks P:   -oral vanc, IV flagyl -vanc enema x 3 days -continue cefepime until gram neg ID, remains on pressors, add double coverage - cipro IV, until ID gram neg rod Dc vanc IV as gram neg rod ID  ENDOCRINE A:  DM2, hyperglycemia, no evidence rel AI P:    -monitor glucose closely -stress dose HC, dc -insulin drip if aggressive, increase to ssi moderate  NEUROLOGIC A:  Acute encephalopathy from septic shock, concern Na rise (at risk CPD), unlikley neck injury (cant clear yet clinically yet, r/o anoxia) P:   -Na in better range -when she can reliably deny neck pain , will dc ollar -avoid benzo -may need mri brain -add asa until cva r/o  Code status: NCB, may need to reverse if neuro improves  TODAY'S SUMMARY: IMproved all around, no indication HD, reduce vent needs, follow gram neg  I have personally obtained a history, examined the patient, evaluated laboratory and imaging results, formulated the assessment and plan and placed orders. CRITICAL CARE: The patient is critically ill with multiple organ systems failure and  requires high complexity decision making for assessment and support, frequent evaluation and titration of therapies, application of advanced monitoring technologies and extensive interpretation of multiple databases. Critical Care Time devoted to patient care services described in this note is 45 minutes.   Rory Percy J.,MD Pulmonary and Critical Care Medicine [facility] Pager: [phone]  03/11/2013, 8:54 AM

## 2013-03-11 NOTE — Progress Notes (Signed)
CRITICAL VALUE ALERT  Critical value received: Ca+ 5.7  Date of notification:  03/11/2013  Time of notification:  1130  Critical value read back:yes  Nurse who received alert:  Tedra Coupe, RN  MD notified (1st page):  Tyson Alias, MD  Time of first page:  1145  MD notified (2nd page):  Time of second page:  Responding MD:  Tyson Alias, MD  Time MD responded:  [phone]

## 2013-03-11 NOTE — Progress Notes (Signed)
eLink Physician Progress Note and Electrolyte Replacement  Patient Name: Krista [name] DOB: 1956/05/20 MRN: [id number]  Date of Service  03/11/2013   HPI/Events of Note    Recent Labs Lab 03/10/13 1200 03/10/13 1454 03/10/13 1600 03/10/13 1800 03/11/13 0435  NA 124* 123* 124* 125* 123*  K 3.4* 3.1* 3.0* 3.6 3.6  CL 95* 94* 94* 94* 95*  CO2 12* 11* 13* 10* 10*  GLUCOSE 296* 269* 230* 169* 203*  BUN 44* 44* 44* 44* 46*  CREATININE 4.82* 4.73* 4.94* 4.75* 4.92*  CALCIUM 4.6* 5.0* 5.1* 5.4* 5.0*    Estimated Creatinine Clearance: 13.9 ml/min (by C-G formula based on Cr of 4.92).  Intake/Output     05/08 0701 - 05/09 0700   I.V. (mL/kg) 2717 (31)   Other 400   NG/GT 850   IV Piggyback 710   Total Intake(mL/kg) 4677 (53.4)   Urine (mL/kg/hr) 508 (0.2)   Stool 490 (0.2)   Total Output 998   Net +3679       Stool Occurrence 1 x    - I/O DETAILED x 24h    Total I/O In: 1645.3 [I.V.:885.3; Other:200; NG/GT:100; IV Piggyback:460] Out: 645 [Urine:155; Stool:490] - I/O THIS SHIFT    ASSESSMENT Hypocalcemia imprvoing creatinine and ? acidosis  eICURN Interventions  Calcium gluconate x 1 Check abg   ASSESSMENT: MAJOR ELECTROLYTE      Dr. Kalman Shan, M.D., [facility].C.P Pulmonary and Critical Care Medicine Staff Physician [facility]  Pulmonary and Critical Care Pager: [phone], If no answer or between  15:00h - 7:00h: call [phone]  03/11/2013 6:01 AM

## 2013-03-11 NOTE — Progress Notes (Signed)
Echocardiogram 2D Echocardiogram has been performed.  Krista [name] 03/11/2013, 2:55 PM

## 2013-03-11 NOTE — Progress Notes (Signed)
CRITICAL VALUE ALERT  Critical value received: Ca+ of 5.5, Magnesium 0.8  Date of notification:  03/11/2013  Time of notification:  1730  Critical value read back:yes  Nurse who received alert:  Carlis Abbott, RN  MD notified (1st page):  Frederico Hamman, MD  Time of first page:  1740  MD notified (2nd page):  Time of second page:  Responding MD:  Frederico Hamman, MD  Time MD responded:  1740

## 2013-03-11 NOTE — Progress Notes (Signed)
ANTIBIOTIC CONSULT NOTE - FOLLOW UP  Pharmacy Consult for Cipro + Cefepime Indication: GNR bacteremia (pending ID and sensitivities)  Allergies  Allergen Reactions  . Codeine     REACTION: itching    Patient Measurements: Height: 5' 4.96" (165 cm) Weight: 193 lb 2 oz (87.6 kg) IBW/kg (Calculated) : 56.91  Vital Signs: Temp: 98.8 F (37.1 C) (05/09 0630) BP: 107/68 mmHg (05/09 0740) Pulse Rate: 92 (05/09 0740) Intake/Output from previous day: 05/08 0701 - 05/09 0700 In: 4887.2 [I.V.:2817.2; NG/GT:850; IV Piggyback:820] Out: 1138 [Urine:538; Stool:600] Intake/Output from this shift:    Labs:  Recent Labs  03/09/13 1759  03/09/13 1830 03/09/13 2215  03/10/13 0503 03/10/13 1105  03/10/13 1600 03/10/13 1800 03/11/13 0435  WBC 25.4*  --   --  39.3*  --   --  45.2*  --   --   --  32.5*  HGB 10.7*  < >  --  11.8*  < > 13.3 11.6*  --   --   --  11.9*  PLT 426*  --   --  366  --   --  462*  --   --   --  366  LABCREA  --   --  87.69  --   --   --   --   --   --   --   --   CREATININE 8.29*  < >  --  5.71*  < > 5.20* 4.45*  < > 4.94* 4.75* 4.92*  < > = values in this interval not displayed. Estimated Creatinine Clearance: 13.9 ml/min (by C-G formula based on Cr of 4.92).  Recent Labs  03/11/13 0612  VANCORANDOM 13.8     Microbiology: Recent Results (from the past 720 hour(s))  URINE CULTURE     Status: None   Collection Time    02/17/13  1:18 PM      Result Value Range Status   Specimen Description URINE, CATHETERIZED   Final   Special Requests NONE   Final   Culture  Setup Time 02/17/2013 22:14   Final   Colony Count >=100,000 COLONIES/ML   Final   Culture     Final   Value: Multiple bacterial morphotypes present, none predominant. Suggest appropriate recollection if clinically indicated.   Report Status 02/19/2013 FINAL   Final  CLOSTRIDIUM DIFFICILE BY PCR     Status: Abnormal   Collection Time    02/17/13 11:18 PM      Result Value Range Status   C  difficile by pcr POSITIVE (*) NEGATIVE Final   Comment: CRITICAL RESULT CALLED TO, READ BACK BY AND VERIFIED WITH:     MAGBATANG RN 9:45 02/17/13 (wilsonm)  CULTURE, BLOOD (ROUTINE X 2)     Status: None   Collection Time    03/09/13  6:30 PM      Result Value Range Status   Specimen Description BLOOD   Final   Special Requests LEFT SUBCLAVIAN AEB 10CC   Final   Culture  Setup Time 03/10/2013 06:03   Final   Culture     Final   Value: GRAM NEGATIVE RODS     4098 Note: Gram Stain Report Called to,Read Back By and Verified With: Jamesetta So 03/11/13 WARRB   Report Status PENDING   Incomplete  URINE CULTURE     Status: None   Collection Time    03/09/13  6:30 PM      Result Value Range Status   Specimen Description  URINE, CLEAN CATCH   Final   Special Requests NONE   Final   Culture  Setup Time 03/09/2013 19:42   Final   Colony Count PENDING   Incomplete   Culture Culture reincubated for better growth   Final   Report Status PENDING   Incomplete  MRSA PCR SCREENING     Status: None   Collection Time    03/09/13  9:20 PM      Result Value Range Status   MRSA by PCR NEGATIVE  NEGATIVE Final   Comment:            The GeneXpert MRSA Assay (FDA     approved for NASAL specimens     only), is one component of a     comprehensive MRSA colonization     surveillance program. It is not     intended to diagnose MRSA     infection nor to guide or     monitor treatment for     MRSA infections.  CLOSTRIDIUM DIFFICILE BY PCR     Status: Abnormal   Collection Time    03/10/13  7:49 AM      Result Value Range Status   C difficile by pcr POSITIVE (*) NEGATIVE Final   Comment: CRITICAL RESULT CALLED TO, READ BACK BY AND VERIFIED WITH:     C. MERCER RN 10:20 03/10/13 (wilsonm)    Anti-infectives   Start     Dose/Rate Route Frequency Ordered Stop   03/11/13 0900  vancomycin (VANCOCIN) IVPB 1000 mg/200 mL premix     1,000 mg 200 mL/hr over 60 Minutes Intravenous Every 48 hours 03/11/13 0802      03/10/13 2200  ceFEPIme (MAXIPIME) 500 mg in dextrose 5 % 50 mL IVPB     500 mg 100 mL/hr over 30 Minutes Intravenous Every 24 hours 03/09/13 2100     03/10/13 1200  vancomycin (VANCOCIN) 500 mg in sodium chloride irrigation 0.9 % 100 mL ENEMA     500 mg Rectal 4 times per day 03/10/13 1033     03/10/13 1200  vancomycin (VANCOCIN) 50 mg/mL oral solution 500 mg     500 mg Oral 4 times per day 03/10/13 1033     03/09/13 2359  vancomycin (VANCOCIN) 500 mg in sodium chloride 0.9 % 100 mL IVPB     500 mg 100 mL/hr over 60 Minutes Intravenous  Once 03/09/13 2310 03/10/13 0131   03/09/13 2200  ceFEPIme (MAXIPIME) 2 g in dextrose 5 % 50 mL IVPB  Status:  Discontinued     2 g 100 mL/hr over 30 Minutes Intravenous  Once 03/09/13 2153 03/09/13 2157   03/09/13 2200  metroNIDAZOLE (FLAGYL) tablet 500 mg  Status:  Discontinued     500 mg Oral 3 times per day 03/09/13 2004 03/09/13 2108   03/09/13 2200  ceFEPIme (MAXIPIME) 2 g in dextrose 5 % 50 mL IVPB     2 g 100 mL/hr over 30 Minutes Intravenous  Once 03/09/13 2100 03/09/13 2230   03/09/13 2115  metroNIDAZOLE (FLAGYL) IVPB 500 mg     500 mg 100 mL/hr over 60 Minutes Intravenous Every 8 hours 03/09/13 2108     03/09/13 2015  vancomycin (VANCOCIN) 50 mg/mL oral solution 125 mg  Status:  Discontinued     125 mg Oral 4 times per day 03/09/13 2004 03/10/13 1033   03/09/13 1815  vancomycin (VANCOCIN) IVPB 1000 mg/200 mL premix     1,000 mg 200 mL/hr over  60 Minutes Intravenous  Once 03/09/13 1807 03/09/13 2003   03/09/13 1815  piperacillin-tazobactam (ZOSYN) IVPB 3.375 g     3.375 g 100 mL/hr over 30 Minutes Intravenous  Once 03/09/13 1807 03/09/13 1902      Assessment: 57 y.o. F to add Ciprofloxacin to Cefepime for double coverage of GNR bacteremia (pending ID and sensitivities). Vancomycin was d/ced this morning.  The patient remains in ARI with SCr 4.92, CrCl~10-15 ml/min.   Vanc IV 5/7 >> 5/9 Cefepime 5/7 >> Flagyl 5/7 >> Vanc po 5/7  >> Vanc enema 5/8 >> (5/10)  5/7 UCx >> 5/7 BCx >> 1/2 GNR 5/7 CDiff >> positive  Goal of Therapy:  Proper antibiotics for infection/cultures adjusted for renal/hepatic function   Plan:  1. Continue Cefepime 500 mg IV every 24 hours 2. Cipro 400 mg IV every 24 hours 3. Will continue to follow renal function, culture results, LOT, and antibiotic de-escalation plans   Georgina Pillion, PharmD, BCPS Clinical Pharmacist Pager: [phone] 03/11/2013 8:07 AM

## 2013-03-11 NOTE — Progress Notes (Signed)
eLink Physician-Brief Progress Note Patient Name: Krista [name] DOB: 09-19-1956 MRN: [id number]  Date of Service  03/11/2013   HPI/Events of Note  Calcium of 5.4.  Corrected for albumin of 1.4 is 7.5  eICU Interventions  Plan: 1 gm of calcium gluconate IV   Intervention Category Intermediate Interventions: Electrolyte abnormality - evaluation and management  DETERDING,ELIZABETH 03/11/2013, 11:14 PM

## 2013-03-11 NOTE — Progress Notes (Signed)
Blood tests reviewed.   Hypomagnesemia, Hypocalcemia and metabolic acidosis most likely due to a combination of diarrhea and renal failure (last lactic acid 1.3)  Mg and Ca replaced; Initiate bicarb drip;

## 2013-03-12 ENCOUNTER — Inpatient Hospital Stay (HOSPITAL_COMMUNITY): Payer: Medicare Other

## 2013-03-12 LAB — CBC WITH DIFFERENTIAL/PLATELET
Basophils Absolute: 0 10*3/uL (ref 0.0–0.1)
Basophils Relative: 0 % (ref 0–1)
Eosinophils Absolute: 0 10*3/uL (ref 0.0–0.7)
Eosinophils Relative: 0 % (ref 0–5)
HCT: 28.2 % — ABNORMAL LOW (ref 36.0–46.0)
Hemoglobin: 10.4 g/dL — ABNORMAL LOW (ref 12.0–15.0)
Lymphocytes Relative: 6 % — ABNORMAL LOW (ref 12–46)
Lymphs Abs: 1.6 10*3/uL (ref 0.7–4.0)
MCH: 29.8 pg (ref 26.0–34.0)
MCHC: 36.9 g/dL — ABNORMAL HIGH (ref 30.0–36.0)
MCV: 80.8 fL (ref 78.0–100.0)
Monocytes Absolute: 0.8 10*3/uL (ref 0.1–1.0)
Monocytes Relative: 3 % (ref 3–12)
Neutro Abs: 22.5 10*3/uL — ABNORMAL HIGH (ref 1.7–7.7)
Neutrophils Relative %: 90 % — ABNORMAL HIGH (ref 43–77)
Platelets: 291 10*3/uL (ref 150–400)
RBC: 3.49 MIL/uL — ABNORMAL LOW (ref 3.87–5.11)
RDW: 14.5 % (ref 11.5–15.5)
WBC: 24.9 10*3/uL — ABNORMAL HIGH (ref 4.0–10.5)

## 2013-03-12 LAB — BASIC METABOLIC PANEL
Calcium: 5.7 mg/dL — CL (ref 8.4–10.5)
Calcium: 5.6 mg/dL — CL (ref 8.4–10.5)
Creatinine, Ser: 4.52 mg/dL — ABNORMAL HIGH (ref 0.50–1.10)
GFR calc Af Amer: 12 mL/min — ABNORMAL LOW (ref >90)
GFR calc non Af Amer: 10 mL/min — ABNORMAL LOW (ref >90)
GFR calc non Af Amer: 10 mL/min — ABNORMAL LOW (ref >90)
Potassium: 3.6 mEq/L (ref 3.5–5.1)
Sodium: 122 mEq/L — ABNORMAL LOW (ref 135–145)
Sodium: 125 mEq/L — ABNORMAL LOW (ref 135–145)

## 2013-03-12 LAB — POCT I-STAT 3, ART BLOOD GAS (G3+)
Acid-base deficit: 8 mmol/L — ABNORMAL HIGH (ref 0.0–2.0)
O2 Saturation: 95 %
Patient temperature: 98.6
TCO2: 16 mmol/L (ref 0–100)
pCO2 arterial: 26.9 mmHg — ABNORMAL LOW (ref 35.0–45.0)

## 2013-03-12 LAB — GLUCOSE, CAPILLARY
Glucose-Capillary: 132 mg/dL — ABNORMAL HIGH (ref 70–99)
Glucose-Capillary: 156 mg/dL — ABNORMAL HIGH (ref 70–99)
Glucose-Capillary: 176 mg/dL — ABNORMAL HIGH (ref 70–99)
Glucose-Capillary: 99 mg/dL (ref 70–99)

## 2013-03-12 LAB — COMPREHENSIVE METABOLIC PANEL
ALT: 61 U/L — ABNORMAL HIGH (ref 0–35)
CO2: 15 mEq/L — ABNORMAL LOW (ref 19–32)
Calcium: 5.6 mg/dL — CL (ref 8.4–10.5)
GFR calc Af Amer: 11 mL/min — ABNORMAL LOW (ref >90)
GFR calc non Af Amer: 10 mL/min — ABNORMAL LOW (ref >90)
Glucose, Bld: 153 mg/dL — ABNORMAL HIGH (ref 70–99)
Sodium: 123 mEq/L — ABNORMAL LOW (ref 135–145)
Total Bilirubin: 0.6 mg/dL (ref 0.3–1.2)

## 2013-03-12 LAB — CALCIUM, IONIZED: Calcium, Ion: 0.86 mmol/L — ABNORMAL LOW (ref 1.12–1.23)

## 2013-03-12 MED ORDER — POTASSIUM CHLORIDE 10 MEQ/50ML IV SOLN
10.0000 meq | INTRAVENOUS | Status: AC
  Administered 2013-03-12 (×4): 10 meq via INTRAVENOUS
  Filled 2013-03-12: qty 200

## 2013-03-12 MED ORDER — SODIUM CHLORIDE 0.9 % IV SOLN
1.0000 g | Freq: Once | INTRAVENOUS | Status: AC
  Administered 2013-03-12: 1 g via INTRAVENOUS
  Filled 2013-03-12: qty 10

## 2013-03-12 NOTE — Progress Notes (Signed)
eLink Physician-Brief Progress Note Patient Name: TARAANN OLTHOFF DOB: September 24, 1956 MRN: [id number]  Date of Service  03/12/2013   HPI/Events of Note  Hypokalemia   eICU Interventions  Potassium replaced   Intervention Category Intermediate Interventions: Electrolyte abnormality - evaluation and management  DETERDING,ELIZABETH 03/12/2013, 6:18 AM

## 2013-03-12 NOTE — Progress Notes (Signed)
CRITICAL VALUE ALERT  Critical value received:  Ca 5.7  Date of notification:  5/10  Time of notification:  1010  Critical value read back:yes  Nurse who received alert:  Sherley Bounds, RN  MD notified (1st page):  Dr. Vassie Loll  Time of first page:  1014  MD notified (2nd page):  Time of second page:  Responding MD:  Dr. Vassie Loll  Time MD responded:  1014

## 2013-03-12 NOTE — Progress Notes (Signed)
eLink Physician-Brief Progress Note Patient Name: Krista [name] DOB: August 02, 1956 MRN: [id number]  Date of Service  03/12/2013   HPI/Events of Note  Hypocalcemia - corrected calcium of 7.8.   eICU Interventions  Plan: Calcium Gluconate IV   Intervention Category Intermediate Interventions: Electrolyte abnormality - evaluation and management  Arbell Wycoff 03/12/2013, 5:47 AM

## 2013-03-12 NOTE — Progress Notes (Signed)
PULMONARY  / CRITICAL CARE MEDICINE  Name: Krista [name] MRN: [id number] DOB: [birth year]    ADMISSION DATE:  03/09/2013 CONSULTATION DATE:  03/09/13   REFERRING MD :  Lynelle Doctor PRIMARY SERVICE: PCCM  CHIEF COMPLAINT:  Septic shock   BRIEF PATIENT DESCRIPTION: 57 y/o female with recent c.diff and schizophrenia was brought in to [facility] ED on 5/7 by EMS after her family said she had been unresponsive for 24 hours.  She was in septic shock, AKI, and had diarrhea and a UTI.  SIGNIFICANT EVENTS / STUDIES:  5/7 CT head >>neg acute 5/7 CT neck >>neg acute 5/8 RUQ U/S >>chronic medical renal disease, Trace bilateral perinephric fluid 5/8 CT abdo/pelvis>>>Increased abdominal ascites and mesenteric edema. Inflammatory changes surround the pancreas. Question pancreatitis. New bilateral pleural effusions, right greater than left, with near total collapse of the right lower lobe. Multiple thoracolumbar compression fractures are stable. 5/8 Ct >>>neg edema, acute  LINES / TUBES: 5/7 ETT (EDP) >> 5/7 L [place] CVL (EDP) >>5/8 transfer out 5/8 rt IJ>>>  CULTURES: 5/7 blood >>>gram neg rod>>> 5/7 urine >> 5/7 c.diff >>POS  ANTIBIOTICS: 5/7 vanc >>5/9 5/7 zosyn x1 5/7 cefepime >> 5/7 vanc oral >> 5/7 flagyl oral >> 5/8 enema vanc>>>plan stop 10th  SUBJECTIVE: Afebrile, no diarrhea improved pressors, improved renal fxn, improved movement  VITAL SIGNS: Temp:  [94.9 F (34.9 C)-98.7 F (37.1 C)] 96.3 F (35.7 C) (05/10 0754) Pulse Rate:  [57-83] 65 (05/10 0754) Resp:  [20-26] 25 (05/10 0754) BP: (88-117)/(43-73) 95/58 mmHg (05/10 0700) SpO2:  [92 %-100 %] 100 % (05/10 0754) FiO2 (%):  [30 %-40 %] 30 % (05/10 0754) Weight:  [90 kg (198 lb 6.6 oz)] 90 kg (198 lb 6.6 oz) (05/10 0400) HEMODYNAMICS: CVP:  [4 mmHg-6 mmHg] 6 mmHg VENTILATOR SETTINGS: Vent Mode:  [-] PRVC FiO2 (%):  [30 %-40 %] 30 % Set Rate:  [26 bmp] 26 bmp Vt Set:  [530 mL-560 mL] 560 mL PEEP:  [5 cmH20-8 cmH20] 5 cmH20 Plateau  Pressure:  [20 cmH20-25 cmH20] 25 cmH20 INTAKE / OUTPUT: Intake/Output     05/09 0701 - 05/10 0700 05/10 0701 - 05/11 0700   I.V. (mL/kg) 2490.3 (27.7)    Other 500    NG/GT 190    IV Piggyback 1080    Total Intake(mL/kg) 4260.3 (47.3)    Urine (mL/kg/hr) 630 (0.3) 60 (0.3)   Stool 300 (0.1)    Total Output 930 60   Net +3330.3 -60          PHYSICAL EXAMINATION:  Gen: grimacing, moving all ext HEENT: collar wnl PULM: Rhonchi bilaterally unchanged CV: s1 s2 rrr AB: BS increased, nontender, no hsm Ext: warm, increased edema 3 plus Neuro:  lt pupil 4mm NRTL, Lt 3mm RTL, slight weakness rt with spont movement, follows intermittent commands  LABS:  Recent Labs Lab 03/09/13 1747  03/09/13 1759 03/09/13 1801  03/09/13 2215  03/10/13 0400  03/10/13 1105  03/10/13 1311 03/10/13 1454  03/10/13 1751  03/11/13 0435 03/11/13 0612  03/11/13 0930 03/11/13 1556 03/11/13 2111 03/12/13 0445  HGB  --   --  10.7*  --   < > 11.8*  < >  --   < > 11.6*  --   --   --   --   --   --  11.9*  --   --   --   --   --  10.4*  WBC  --   < > 25.4*  --   --  39.3*  --   --   --  45.2*  --   --   --   --   --   --  32.5*  --   --   --   --   --  24.9*  PLT  --   < > 426*  --   --  366  --   --   --  462*  --   --   --   --   --   --  366  --   --   --   --   --  291  NA  --   --  108*  --   < > 120*  < >  --   < > 127*  < >  --  123*  < >  --   < > 123*  --   < >  --  124* 123* 123*  K  --   --  3.1*  --   < > 2.7*  < >  --   < > 3.4*  < >  --  3.1*  < >  --   < > 3.6  --   < >  --  3.5 3.5 3.2*  CL  --   --  72*  --   < > 91*  < >  --   < > 99  < >  --  94*  < >  --   < > 95*  --   < >  --  95* 94* 93*  CO2  --   < > 11*  --   --  8*  < >  --   --  11*  < >  --  11*  < >  --   < > 10*  --   < >  --  12* 14* 15*  GLUCOSE  --   --  77  --   < > 150*  < >  --   < > 287*  < >  --  269*  < >  --   < > 203*  --   < >  --  202* 186* 153*  BUN  --   --  64*  --   < > 49*  < >  --   < > 42*  < >  --  44*   < >  --   < > 46*  --   < >  --  48* 48* 48*  CREATININE  --   --  8.29*  --   < > 5.71*  < >  --   < > 4.45*  < >  --  4.73*  < >  --   < > 4.92*  --   < >  --  4.99* 4.83* 4.59*  CALCIUM  --   < > 6.0*  --   --  4.5*  < >  --   --  4.0*  < >  --  5.0*  < >  --   < > 5.0*  --   < >  --  5.5* 5.4* 5.6*  MG  --   --   --   --   --   --   --   --   --   --   --   --   --   --   --   --   --   --   --   --  0.8*  --   --   AST  --   --  233*  --   --   --   < >  --   --   --   --   --   --   --   --   --  292*  --   --   --  233*  --  188*  ALT  --   --  83*  --   --   --   < >  --   --   --   --   --   --   --   --   --  82*  --   --   --  75*  --  61*  ALKPHOS  --   --  848*  --   --   --   < >  --   --   --   --   --   --   --   --   --  542*  --   --   --  533*  --  450*  BILITOT  --   --  1.7*  --   --   --   < >  --   --   --   --   --   --   --   --   --  0.7  --   --   --  0.7  --  0.6  PROT  --   --  5.2*  --   --   --   < >  --   --   --   --   --   --   --   --   --  3.8*  --   --   --  3.9*  --  3.7*  ALBUMIN  --   --  2.1*  --   --   --   < >  --   --   --   --   --   --   --   --   --  1.4*  --   --   --  1.4*  --  1.3*  APTT  --   --   --   --   --  34  --   --   --   --   --   --   --   --   --   --   --   --   --   --   --   --   --   INR  --   --  1.20  --   --  1.37  --   --   --   --   --   --   --   --   --   --   --   --   --   --   --   --   --   LATICACIDVEN  --   --   --   --   < >  --   --  2.4*  --  2.5*  --   --   --   --   --   --   --   --   --  1.3  --   --   --   TROPONINI  --   --   --  1.11*  --   --   --   --   --   --   --   --  2.34*  --   --   --   --   --   --   --   --   --   --   PROCALCITON 7.61  --   --   --   --  3.60  --   --   --   --   --   --   --   --   --   --   --   --   --   --   --   --   --   PHART  --   --   --   --   < >  --   < >  --   < >  --   < > 7.376  --   --  7.323*  --   --  7.329*  --   --   --   --   --   PCO2ART  --   --   --   --   < >   --   < >  --   < >  --   < > 17.8*  --   --  18.7*  --   --  19.3*  --   --   --   --   --   PO2ART  --   --   --   --   < >  --   < >  --   < >  --   < > 79.0*  --   --  93.0  --   --  148.0*  --   --   --   --   --   < > = values in this interval not displayed.  Recent Labs Lab 03/11/13 1555 03/11/13 1935 03/12/13 0033 03/12/13 0400 03/12/13 0732  GLUCAP 189* 185* 176* 156* 99    CXR: ETT, CVL in place, bibasilar effusions  ASSESSMENT / PLAN:  PULMONARY A: Acute respiratory failure due to increased work of breathing from sepsis and inability to protect airway from encephalopathy P:   -Lower RR to 20, rpt ABG -pcxr with effusions, cvp 10 -start sbt   CARDIOVASCULAR A: Septic shock NSTEMI> demand ischaemia Echo - nml LV fn P:  -levophed/vasopressin, improved -stress dose steroids given refractory shock, dc cortisol 73   RENAL A:  AKI due to septic shock and rhabdomyolysis Hyponatremia in setting of AKI, profound volume depletion; relatively fast drop -hypocalcemia AG and NONAG  acidosis P:   -no hydro -OK to normalise NA  now since > 48 h -bmet to q12h -repeat calcium gluconate, needs even after correction albumin - chk ionised Ca  -with rhabdo , may consider lasix with volume --ct  bicarb for NON AG (stool)  GASTROINTESTINAL A:  C.diff colitis Elevated Alk phos and AST, biliary disease?  Likely rhabdo CT without ischemia   P:   -see ID -mesenteric edema, maintain NPO x 24 hrs further then consider T F -hypocalc, pancreatic edema? from ischemia?, assess amy, lip -avoid ppi cdiff, pepcid  HEMATOLOGIC A:  Leukocytosis, from cdiff, dvt prevention P:  Sub q hep Cbc in am   INFECTIOUS A:  Gram neg Septic shock due to UTI and c.diff and cellulitis/skin breakdown on buttocks P:   -oral vanc, IV flagyl -vanc enema x 3 days -continue cefepime until gram neg ID, remains on pressors, add double coverage - cipro IV, until ID gram neg rod Dc vanc IV as  gram  neg rod ID  ENDOCRINE A:  DM2, hyperglycemia, no evidence rel AI P:   -monitor glucose closely -stress dose HC, dc -insulin drip if aggressive, increase to ssi moderate  NEUROLOGIC A:  Acute encephalopathy from septic shock, concern Na rise (at risk CPD), unlikley neck injury (cant clear yet clinically yet, r/o anoxia) -improved mental status P:   -Na in better range -when she can reliably deny neck pain , will dc ollar -avoid benzo -defer mri brain -add asa until cva r/o  Code status: NCB, may need to reverse if neuro improves  TODAY'S SUMMARY: Improved all around, no indication HD, reduce vent needs, follow gram neg  I have personally obtained a history, examined the patient, evaluated laboratory and imaging results, formulated the assessment and plan and placed orders. CRITICAL CARE: The patient is critically ill with multiple organ systems failure and requires high complexity decision making for assessment and support, frequent evaluation and titration of therapies, application of advanced monitoring technologies and extensive interpretation of multiple databases. Critical Care Time devoted to patient care services described in this note is 45 minutes.   [facility] V.,MD Pulmonary and Critical Care Medicine [facility] Pager: [phone]  03/12/2013, 8:57 AM

## 2013-03-13 ENCOUNTER — Inpatient Hospital Stay (HOSPITAL_COMMUNITY): Payer: Medicare Other

## 2013-03-13 LAB — GLUCOSE, CAPILLARY
Glucose-Capillary: 93 mg/dL (ref 70–99)
Glucose-Capillary: 119 mg/dL — ABNORMAL HIGH (ref 70–99)
Glucose-Capillary: 124 mg/dL — ABNORMAL HIGH (ref 70–99)

## 2013-03-13 LAB — BLOOD GAS, ARTERIAL
Bicarbonate: 18.4 mEq/L — ABNORMAL LOW (ref 20.0–24.0)
PEEP: 5 cmH2O
Patient temperature: 97.3
TCO2: 19.2 mmol/L (ref 0–100)
pCO2 arterial: 25.9 mmHg — ABNORMAL LOW (ref 35.0–45.0)
pH, Arterial: 7.46 — ABNORMAL HIGH (ref 7.350–7.450)

## 2013-03-13 LAB — BASIC METABOLIC PANEL
BUN: 49 mg/dL — ABNORMAL HIGH (ref 6–23)
BUN: 48 mg/dL — ABNORMAL HIGH (ref 6–23)
Calcium: 5.9 mg/dL — CL (ref 8.4–10.5)
Calcium: 5.8 mg/dL — CL (ref 8.4–10.5)
Calcium: 6.3 mg/dL — CL (ref 8.4–10.5)
Creatinine, Ser: 4.37 mg/dL — ABNORMAL HIGH (ref 0.50–1.10)
GFR calc Af Amer: 12 mL/min — ABNORMAL LOW (ref >90)
GFR calc Af Amer: 14 mL/min — ABNORMAL LOW (ref >90)
GFR calc Af Amer: 14 mL/min — ABNORMAL LOW (ref >90)
GFR calc non Af Amer: 10 mL/min — ABNORMAL LOW (ref >90)
GFR calc non Af Amer: 12 mL/min — ABNORMAL LOW (ref >90)
GFR calc non Af Amer: 12 mL/min — ABNORMAL LOW (ref >90)
Glucose, Bld: 123 mg/dL — ABNORMAL HIGH (ref 70–99)
Glucose, Bld: 139 mg/dL — ABNORMAL HIGH (ref 70–99)
Potassium: 3.2 mEq/L — ABNORMAL LOW (ref 3.5–5.1)
Potassium: 3.8 mEq/L (ref 3.5–5.1)
Sodium: 123 mEq/L — ABNORMAL LOW (ref 135–145)
Sodium: 124 mEq/L — ABNORMAL LOW (ref 135–145)

## 2013-03-13 LAB — CBC
HCT: 26.5 % — ABNORMAL LOW (ref 36.0–46.0)
Hemoglobin: 10 g/dL — ABNORMAL LOW (ref 12.0–15.0)
MCH: 30.5 pg (ref 26.0–34.0)
MCHC: 37.7 g/dL — ABNORMAL HIGH (ref 30.0–36.0)
MCV: 80.8 fL (ref 78.0–100.0)
RDW: 14.4 % (ref 11.5–15.5)

## 2013-03-13 LAB — CK: Total CK: 2270 U/L — ABNORMAL HIGH (ref 7–177)

## 2013-03-13 MED ORDER — NEPRO/CARBSTEADY PO LIQD
1000.0000 mL | ORAL | Status: DC
  Administered 2013-03-13 – 2013-03-14 (×2): 1000 mL
  Filled 2013-03-13 (×4): qty 1000

## 2013-03-13 MED ORDER — SODIUM CHLORIDE 0.9 % IV SOLN
1.0000 g | Freq: Once | INTRAVENOUS | Status: AC
  Administered 2013-03-13: 1 g via INTRAVENOUS
  Filled 2013-03-13: qty 10

## 2013-03-13 MED ORDER — SODIUM CHLORIDE 0.9 % IV BOLUS (SEPSIS)
500.0000 mL | Freq: Once | INTRAVENOUS | Status: AC
  Administered 2013-03-13: 500 mL via INTRAVENOUS

## 2013-03-13 MED ORDER — POTASSIUM CHLORIDE 20 MEQ/15ML (10%) PO LIQD
30.0000 meq | ORAL | Status: AC
  Administered 2013-03-13 (×2): 30 meq
  Filled 2013-03-13 (×2): qty 22.5

## 2013-03-13 MED ORDER — POTASSIUM CHLORIDE 20 MEQ/15ML (10%) PO LIQD
ORAL | Status: AC
  Filled 2013-03-13: qty 30

## 2013-03-13 MED ORDER — DEXTROSE 5 % IV SOLN: 2.0000 ug/min | INTRAVENOUS | Status: DC

## 2013-03-13 NOTE — Progress Notes (Signed)
PULMONARY  / CRITICAL CARE MEDICINE  Name: Krista [name] MRN: [id number] DOB: 20-Dec-1955    ADMISSION DATE:  03/09/2013 CONSULTATION DATE:  03/09/13   REFERRING MD :  Lynelle Doctor PRIMARY SERVICE: PCCM  CHIEF COMPLAINT:  Septic shock   BRIEF PATIENT DESCRIPTION: 57 y/o female with recent c.diff and schizophrenia was brought in to [facility] ED on 5/7 by EMS after her family said she had been unresponsive for 24 hours.  She was in septic shock, AKI, and had diarrhea and a UTI.  SIGNIFICANT EVENTS / STUDIES:  5/7 CT head >>neg acute 5/7 CT neck >>neg acute 5/8 RUQ U/S >>chronic medical renal disease, Trace bilateral perinephric fluid 5/8 CT abdo/pelvis>>>Increased abdominal ascites and mesenteric edema. Inflammatory changes surround the pancreas. Question pancreatitis. New bilateral pleural effusions, right greater than left, with near total collapse of the right lower lobe. Multiple thoracolumbar compression fractures are stable. 5/8 Ct >>>neg edema, acute  LINES / TUBES: 5/7 ETT (EDP) >> 5/7 L [place] CVL (EDP) >>5/8 transfer out 5/8 rt IJ>>>  CULTURES: 5/7 blood >>>eneteobacter 5/7 urine >> 5/7 c.diff >>POS  ANTIBIOTICS: 5/7 vanc >>5/9 5/7 zosyn x1 5/7 cefepime >> 5/7 vanc oral >> 5/7 flagyl oral >> 5/8 enema vanc>>>plan stop 10th  SUBJECTIVE: Afebrile, decreased diarrhea Off pressors, improved renal fxn, improved UO  VITAL SIGNS: Temp:  [95.3 F (35.2 C)-97.4 F (36.3 C)] 97.1 F (36.2 C) (05/11 1100) Pulse Rate:  [32-84] 70 (05/11 1100) Resp:  [10-21] 10 (05/11 1100) BP: (76-121)/(42-76) 95/49 mmHg (05/11 1100) SpO2:  [98 %-100 %] 100 % (05/11 1100) FiO2 (%):  [30 %] 30 % (05/11 1000) Weight:  [92.9 kg (204 lb 12.9 oz)] 92.9 kg (204 lb 12.9 oz) (05/11 0500) HEMODYNAMICS: CVP:  [6 mmHg-10 mmHg] 6 mmHg VENTILATOR SETTINGS: Vent Mode:  [-] CPAP FiO2 (%):  [30 %] 30 % Set Rate:  [20 bmp] 20 bmp Vt Set:  [550 mL] 550 mL PEEP:  [5 cmH20] 5 cmH20 Pressure Support:  [15 cmH20] 15  cmH20 Plateau Pressure:  [25 cmH20-26 cmH20] 25 cmH20 INTAKE / OUTPUT: Intake/Output     05/10 0701 - 05/11 0700 05/11 0701 - 05/12 0700   I.V. (mL/kg) 3306.5 (35.6) 523.3 (5.6)   Other 50    NG/GT 120    IV Piggyback 650 200   Total Intake(mL/kg) 4126.5 (44.4) 723.3 (7.8)   Urine (mL/kg/hr) 995 (0.4)    Stool 200 (0.1)    Total Output 1195     Net +2931.5 +723.3          PHYSICAL EXAMINATION:  Gen: grimacing, moving all ext HEENT: collar wnl PULM: Rhonchi bilaterally unchanged CV: s1 s2 rrr AB: BS increased, nontender, no hsm Ext: warm, increased edema 3 plus Neuro:  lt pupil 4mm NRTL, Lt 3mm RTL, slight weakness rt with spont movement, follows intermittent commands  LABS:  Recent Labs Lab 03/09/13 1747  03/09/13 1759 03/09/13 1801  03/09/13 2215  03/10/13 0400  03/10/13 1105  03/10/13 1454  03/11/13 0435 03/11/13 0612  03/11/13 0930 03/11/13 1556  03/12/13 0445 03/12/13 0911 03/12/13 1034 03/12/13 2020 03/13/13 0417 03/13/13 0446  HGB  --   --  10.7*  --   < > 11.8*  < >  --   < > 11.6*  --   --   --  11.9*  --   --   --   --   --  10.4*  --   --   --   --  10.0*  WBC  --   < > 25.4*  --   --  39.3*  --   --   --  45.2*  --   --   --  32.5*  --   --   --   --   --  24.9*  --   --   --   --  21.5*  PLT  --   < > 426*  --   --  366  --   --   --  462*  --   --   --  366  --   --   --   --   --  291  --   --   --   --  218  NA  --   --  108*  --   < > 120*  < >  --   < > 127*  < > 123*  < > 123*  --   < >  --  124*  < > 123* 122*  --  125*  --  125*  K  --   --  3.1*  --   < > 2.7*  < >  --   < > 3.4*  < > 3.1*  < > 3.6  --   < >  --  3.5  < > 3.2* 3.7  --  3.6  --  3.4*  CL  --   --  72*  --   < > 91*  < >  --   < > 99  < > 94*  < > 95*  --   < >  --  95*  < > 93* 93*  --  93*  --  92*  CO2  --   < > 11*  --   --  8*  < >  --   --  11*  < > 11*  < > 10*  --   < >  --  12*  < > 15* 16*  --  18*  --  20  GLUCOSE  --   --  77  --   < > 150*  < >  --   < > 287*  < >  269*  < > 203*  --   < >  --  202*  < > 153* 137*  --  123*  --  123*  BUN  --   --  64*  --   < > 49*  < >  --   < > 42*  < > 44*  < > 46*  --   < >  --  48*  < > 48* 46*  --  48*  --  49*  CREATININE  --   --  8.29*  --   < > 5.71*  < >  --   < > 4.45*  < > 4.73*  < > 4.92*  --   < >  --  4.99*  < > 4.59* 4.52*  --  4.46*  --  4.37*  CALCIUM  --   < > 6.0*  --   --  4.5*  < >  --   --  4.0*  < > 5.0*  < > 5.0*  --   < >  --  5.5*  < > 5.6* 5.7*  --  5.6*  --  5.9*  MG  --   --   --   --   --   --   --   --   --   --   --   --   --   --   --   --   --  0.8*  --   --   --   --   --   --   --   AST  --   --  233*  --   --   --   < >  --   --   --   --   --   --  292*  --   --   --  233*  --  188*  --   --   --   --   --   ALT  --   --  83*  --   --   --   < >  --   --   --   --   --   --  82*  --   --   --  75*  --  61*  --   --   --   --   --   ALKPHOS  --   --  848*  --   --   --   < >  --   --   --   --   --   --  542*  --   --   --  533*  --  450*  --   --   --   --   --   BILITOT  --   --  1.7*  --   --   --   < >  --   --   --   --   --   --  0.7  --   --   --  0.7  --  0.6  --   --   --   --   --   PROT  --   --  5.2*  --   --   --   < >  --   --   --   --   --   --  3.8*  --   --   --  3.9*  --  3.7*  --   --   --   --   --   ALBUMIN  --   --  2.1*  --   --   --   < >  --   --   --   --   --   --  1.4*  --   --   --  1.4*  --  1.3*  --   --   --   --   --   APTT  --   --   --   --   --  34  --   --   --   --   --   --   --   --   --   --   --   --   --   --   --   --   --   --   --   INR  --   --  1.20  --   --  1.37  --   --   --   --   --   --   --   --   --   --   --   --   --   --   --   --   --   --   --   LATICACIDVEN  --   --   --   --   < >  --   --  2.4*  --  2.5*  --   --   --   --   --   --  1.3  --   --   --   --   --   --   --   --   TROPONINI  --   --   --  1.11*  --   --   --   --   --   --   --  2.34*  --   --   --   --   --   --   --   --   --   --   --   --   --   PROCALCITON  7.61  --   --   --   --  3.60  --   --   --   --   --   --   --   --   --   --   --   --   --   --   --   --   --   --   --   PHART  --   --   --   --   < >  --   < >  --   < >  --   < >  --   < >  --  7.329*  --   --   --   --   --   --  7.369  --  7.460*  --   PCO2ART  --   --   --   --   < >  --   < >  --   < >  --   < >  --   < >  --  19.3*  --   --   --   --   --   --  26.9*  --  25.9*  --   PO2ART  --   --   --   --   < >  --   < >  --   < >  --   < >  --   < >  --  148.0*  --   --   --   --   --   --  75.0*  --  71.7*  --   < > = values in this interval not displayed.  Recent Labs Lab 03/12/13 0400 03/12/13 0732 03/12/13 1124 03/12/13 1603 03/12/13 2023  GLUCAP 156* 99 129* 132* 118*    CXR: ETT, CVL in place, lt ASD slight worse  ASSESSMENT / PLAN:  PULMONARY A: Acute respiratory failure due to increased work of breathing from sepsis and inability to protect airway from encephalopathy P:   -SBTs with goal extubation in 24-48h   CARDIOVASCULAR A: Septic shock NSTEMI> demand ischaemia Echo - nml LV fn P:  -taper levophed/vasopressin to off  -dc'd stress dose steroids given refractory shock,  cortisol 73   RENAL A:  AKI due to septic shock and rhabdomyolysis Hyponatremia in setting of AKI, profound volume depletion; relatively fast drop -hypocalcemia AG and NONAG  Acidosis [place] --no hydro P:   -OK to normalise NA  now since > 48 h -bmet to q12h -repeat calcium gluconate-rpt  ionised Ca  -with rhabdo , may consider lasix  Once cr lower --ct  bicarb @ 50 for NON AG (stool)  GASTROINTESTINAL A:  C.diff colitis Elevated Alk phos and  AST, biliary disease?  Likely rhabdo CT without ischemia   P:   -mesenteric edema, start Tfs  -avoid ppi cdiff, pepcid  HEMATOLOGIC A:  Leukocytosis, from cdiff, dvt prevention P:  Sub q hep   INFECTIOUS A:  Gram neg Septic shock due to UTI and c.diff and cellulitis/skin breakdown on buttocks P:   -oral vanc, IV  flagyl -vanc enema x 3 days -continue cefepime sens back on enterobacter, - cipro IV   ENDOCRINE A:  DM2, hyperglycemia, no evidence rel AI P:   -monitor glucose closely -dc stress dose HC - increase to ssi moderate  NEUROLOGIC A:  Acute encephalopathy from septic shock, concern Na rise (at risk CPD), unlikley neck injury (cant clear yet clinically yet, r/o anoxia) -improved mental status P:   -Na in better range -when she can reliably deny neck pain , will dc ollar -avoid benzo -defer mri brain -add asa until cva r/o  Code status: NCB, may need to reverse if neuro improves  TODAY'S SUMMARY: Improved all around, no indication HD, reduce vent needs, follow gram neg  I have personally obtained a history, examined the patient, evaluated laboratory and imaging results, formulated the assessment and plan and placed orders. CRITICAL CARE: The patient is critically ill with multiple organ systems failure and requires high complexity decision making for assessment and support, frequent evaluation and titration of therapies, application of advanced monitoring technologies and extensive interpretation of multiple databases. Critical Care Time devoted to patient care services described in this note is 45 minutes.   [facility] V.,MD Pulmonary and Critical Care Medicine [facility] Pager: [phone]  03/13/2013, 11:53 AM

## 2013-03-13 NOTE — Progress Notes (Signed)
eLink Physician-Brief Progress Note Patient Name: AMIAYAH GIEBEL DOB: 21-Dec-1955 MRN: [id number]  Date of Service  03/13/2013   HPI/Events of Note  Hypotension with BP of 79/38 (47) in the setting of increase in fentanyl dose and CVP of 8.   eICU Interventions  Plan: Fentanyl reduced back to 25 mcg  500 cc bolus of NS for BP support  Continue to monitor - may need to resume NE gtt for BP support   Intervention Category Intermediate Interventions: Hypotension - evaluation and management  DETERDING,ELIZABETH 03/13/2013, 11:35 PM

## 2013-03-13 NOTE — Progress Notes (Signed)
eLink Physician-Brief Progress Note Patient Name: Krista [name] DOB: [birth year] MRN: [id number]  Date of Service  03/13/2013   HPI/Events of Note   hypocalcemia. Uncorrected calcium 6.3 at 8:35 PM   eICU Interventions   calcium gluconate    Intervention Category Intermediate Interventions: Electrolyte abnormality - evaluation and management  Kodie Kishi 03/13/2013, 9:45 PM

## 2013-03-13 NOTE — Progress Notes (Addendum)
CRITICAL VALUE ALERT  Critical value received:  Calcium 6.3  Date of notification:  03/13/13  Time of notification:  2139  Critical value read back:yes  Nurse who received alert:  [id number]  MD notified (1st page):  Dr. Colletta Maryland  Time of first page:  2140  MD notified (2nd page):  Time of second page:  Responding MD:  Dr. Colletta Maryland  Time MD responded:  2140

## 2013-03-14 ENCOUNTER — Inpatient Hospital Stay (HOSPITAL_COMMUNITY): Payer: Medicare Other

## 2013-03-14 DIAGNOSIS — E876 Hypokalemia: Secondary | ICD-10-CM

## 2013-03-14 LAB — CBC
HCT: 22.1 % — ABNORMAL LOW (ref 36.0–46.0)
Hemoglobin: 8.1 g/dL — ABNORMAL LOW (ref 12.0–15.0)
MCV: 81.5 fL (ref 78.0–100.0)
WBC: 10.3 10*3/uL (ref 4.0–10.5)

## 2013-03-14 LAB — BLOOD GAS, ARTERIAL
Drawn by: 13898
MECHVT: 550 mL
O2 Saturation: 97 %
PEEP: 5 cmH2O
Patient temperature: 98.6
RATE: 20 resp/min
pH, Arterial: 7.539 — ABNORMAL HIGH (ref 7.350–7.450)

## 2013-03-14 LAB — GLUCOSE, CAPILLARY
Glucose-Capillary: 143 mg/dL — ABNORMAL HIGH (ref 70–99)
Glucose-Capillary: 158 mg/dL — ABNORMAL HIGH (ref 70–99)
Glucose-Capillary: 118 mg/dL — ABNORMAL HIGH (ref 70–99)

## 2013-03-14 LAB — BASIC METABOLIC PANEL
BUN: 49 mg/dL — ABNORMAL HIGH (ref 6–23)
CO2: 24 mEq/L (ref 19–32)
CO2: 25 mEq/L (ref 19–32)
Chloride: 93 mEq/L — ABNORMAL LOW (ref 96–112)
Chloride: 93 mEq/L — ABNORMAL LOW (ref 96–112)
Creatinine, Ser: 3.21 mg/dL — ABNORMAL HIGH (ref 0.50–1.10)
GFR calc Af Amer: 16 mL/min — ABNORMAL LOW (ref >90)
Glucose, Bld: 117 mg/dL — ABNORMAL HIGH (ref 70–99)
Potassium: 3.4 mEq/L — ABNORMAL LOW (ref 3.5–5.1)

## 2013-03-14 LAB — POCT I-STAT 3, ART BLOOD GAS (G3+)
TCO2: 24 mmol/L (ref 0–100)
pCO2 arterial: 30.9 mmHg — ABNORMAL LOW (ref 35.0–45.0)
pH, Arterial: 7.473 — ABNORMAL HIGH (ref 7.350–7.450)

## 2013-03-14 LAB — RENAL FUNCTION PANEL
Albumin: 1.3 g/dL — ABNORMAL LOW (ref 3.5–5.2)
BUN: 50 mg/dL — ABNORMAL HIGH (ref 6–23)
Calcium: 6.3 mg/dL — CL (ref 8.4–10.5)
Chloride: 94 mEq/L — ABNORMAL LOW (ref 96–112)
Creatinine, Ser: 3.71 mg/dL — ABNORMAL HIGH (ref 0.50–1.10)
Phosphorus: 3.2 mg/dL (ref 2.3–4.6)

## 2013-03-14 LAB — URINE CULTURE

## 2013-03-14 MED ORDER — COLLAGENASE 250 UNIT/GM EX OINT
1.0000 "application " | TOPICAL_OINTMENT | Freq: Every day | CUTANEOUS | Status: DC
  Administered 2013-03-14 – 2013-03-16 (×3): 1 via TOPICAL
  Filled 2013-03-14: qty 30

## 2013-03-14 MED ORDER — POTASSIUM CHLORIDE 20 MEQ/15ML (10%) PO LIQD
30.0000 meq | ORAL | Status: AC
  Administered 2013-03-14 (×2): 30 meq
  Filled 2013-03-14 (×2): qty 22.5

## 2013-03-14 MED ORDER — FAMOTIDINE 40 MG/5ML PO SUSR
20.0000 mg | Freq: Every day | ORAL | Status: DC
  Administered 2013-03-14 – 2013-03-15 (×2): 20 mg
  Filled 2013-03-14 (×3): qty 2.5

## 2013-03-14 MED ORDER — FUROSEMIDE 10 MG/ML IJ SOLN
40.0000 mg | Freq: Once | INTRAMUSCULAR | Status: AC
  Administered 2013-03-14: 40 mg via INTRAVENOUS
  Filled 2013-03-14: qty 4

## 2013-03-14 MED ORDER — SODIUM CHLORIDE 0.9 % IV SOLN
1.0000 g | Freq: Once | INTRAVENOUS | Status: AC
  Administered 2013-03-14: 1 g via INTRAVENOUS
  Filled 2013-03-14: qty 10

## 2013-03-14 MED ORDER — NEPRO/CARBSTEADY PO LIQD
1000.0000 mL | ORAL | Status: DC
  Filled 2013-03-14 (×2): qty 1000

## 2013-03-14 MED ORDER — METRONIDAZOLE 50 MG/ML ORAL SUSPENSION
500.0000 mg | Freq: Three times a day (TID) | ORAL | Status: DC
  Administered 2013-03-14 – 2013-03-16 (×6): 500 mg
  Filled 2013-03-14 (×8): qty 10

## 2013-03-14 NOTE — Progress Notes (Deleted)
[facility] ADULT ICU REPLACEMENT PROTOCOL FOR AM LAB REPLACEMENT ONLY  The patient does not apply for the [facility] Adult ICU Electrolyte Replacment Protocol based on the criteria listed below:    2. Is urine output >/= 0.5 ml/kg/hr for the last 6 hours? no Patient's UOP is 0 ml/kg/hr  4. Abnormal electrolyte(s):K 3.3  6. If a panic level lab has been reported, has the CCM MD in charge been notified? yes.   Physician:  E Deterding,MD  Krista [name] 03/14/2013 6:19 AM

## 2013-03-14 NOTE — Progress Notes (Signed)
[facility] ADULT ICU REPLACEMENT PROTOCOL FOR AM LAB REPLACEMENT ONLY  The patient does not apply for the [facility] Adult ICU Electrolyte Replacment Protocol based on the criteria listed below:   1. Is GFR >/= 40 ml/min? no  Patient's GFR today is 13  4. Abnormal electrolyte(s): K3.3  6. If a panic level lab has been reported, has the CCM MD in charge been notified? yes.   Physician:  E Deterding,MD  Melrose Nakayama 03/14/2013 6:26 AM

## 2013-03-14 NOTE — Progress Notes (Signed)
ANTIBIOTIC CONSULT NOTE - FOLLOW UP  Pharmacy Consult for Cipro Indication: Enterobacter cloacae bacteremia  Allergies  Allergen Reactions  . Codeine     REACTION: itching    Patient Measurements: Height: 5' 4.96" (165 cm) Weight: 210 lb 8.6 oz (95.5 kg) IBW/kg (Calculated) : 56.91  Vital Signs: Temp: 98.1 F (36.7 C) (05/12 1000) Temp src: Core (Comment) (05/12 1000) BP: 103/70 mmHg (05/12 1000) Pulse Rate: 114 (05/12 1000) Intake/Output from previous day: 05/11 0701 - 05/12 0700 In: 3759.7 [I.V.:2139.7; NG/GT:460; IV Piggyback:1110] Out: 1195 [Urine:1190; Stool:5] Intake/Output from this shift: Total I/O In: 341.5 [I.V.:221.5; NG/GT:120] Out: 225 [Urine:225]  Labs:  Recent Labs  03/12/13 0445  03/13/13 0446  03/13/13 2035 03/14/13 0400 03/14/13 0911  WBC 24.9*  --  21.5*  --   --  10.3  --   HGB 10.4*  --  10.0*  --   --  8.1*  --   PLT 291  --  218  --   --  127*  --   CREATININE 4.59*  < > 4.37*  < > 3.97* 3.71* 3.38*  < > = values in this interval not displayed. Estimated Creatinine Clearance: 21.2 ml/min (by C-G formula based on Cr of 3.38). No results found for this basename: VANCOTROUGH, Leodis Binet, VANCORANDOM, GENTTROUGH, GENTPEAK, GENTRANDOM, TOBRATROUGH, TOBRAPEAK, TOBRARND, AMIKACINPEAK, AMIKACINTROU, AMIKACIN,  in the last 72 hours   Microbiology: Recent Results (from the past 720 hour(s))  URINE CULTURE     Status: None   Collection Time    02/17/13  1:18 PM      Result Value Range Status   Specimen Description URINE, CATHETERIZED   Final   Special Requests NONE   Final   Culture  Setup Time 02/17/2013 22:14   Final   Colony Count >=100,000 COLONIES/ML   Final   Culture     Final   Value: Multiple bacterial morphotypes present, none predominant. Suggest appropriate recollection if clinically indicated.   Report Status 02/19/2013 FINAL   Final  CLOSTRIDIUM DIFFICILE BY PCR     Status: Abnormal   Collection Time    02/17/13 11:18 PM   Result Value Range Status   C difficile by pcr POSITIVE (*) NEGATIVE Final   Comment: CRITICAL RESULT CALLED TO, READ BACK BY AND VERIFIED WITH:     MAGBATANG RN 9:45 02/17/13 (wilsonm)  CULTURE, BLOOD (ROUTINE X 2)     Status: None   Collection Time    03/09/13  6:02 PM      Result Value Range Status   Specimen Description BLOOD RIGHT ARM   Final   Special Requests BOTTLES DRAWN AEROBIC ONLY   Final   Culture  Setup Time 03/10/2013 00:43   Final   Culture     Final   Value:        BLOOD CULTURE RECEIVED NO GROWTH TO DATE CULTURE WILL BE HELD FOR 5 DAYS BEFORE ISSUING A FINAL NEGATIVE REPORT   Report Status PENDING   Incomplete  CULTURE, BLOOD (ROUTINE X 2)     Status: None   Collection Time    03/09/13  6:30 PM      Result Value Range Status   Specimen Description BLOOD   Final   Special Requests LEFT SUBCLAVIAN AEB 10CC   Final   Culture  Setup Time 03/10/2013 06:03   Final   Culture     Final   Value: ENTEROBACTER CLOACAE     0414 Note: Gram Stain Report Called  to,Read Back By and Verified With: Jamesetta So 03/11/13 WARRB   Report Status PENDING   Incomplete   Organism ID, Bacteria ENTEROBACTER CLOACAE   Final  URINE CULTURE     Status: None   Collection Time    03/09/13  6:30 PM      Result Value Range Status   Specimen Description URINE, CLEAN CATCH   Final   Special Requests NONE   Final   Culture  Setup Time 03/09/2013 19:42   Final   Colony Count >=100,000 COLONIES/ML   Final   Culture ENTEROBACTER CLOACAE   Final   Report Status 03/14/2013 FINAL   Final   Organism ID, Bacteria ENTEROBACTER CLOACAE   Final  MRSA PCR SCREENING     Status: None   Collection Time    03/09/13  9:20 PM      Result Value Range Status   MRSA by PCR NEGATIVE  NEGATIVE Final   Comment:            The GeneXpert MRSA Assay (FDA     approved for NASAL specimens     only), is one component of a     comprehensive MRSA colonization     surveillance program. It is not     intended to  diagnose MRSA     infection nor to guide or     monitor treatment for     MRSA infections.  CLOSTRIDIUM DIFFICILE BY PCR     Status: Abnormal   Collection Time    03/10/13  7:49 AM      Result Value Range Status   C difficile by pcr POSITIVE (*) NEGATIVE Final   Comment: CRITICAL RESULT CALLED TO, READ BACK BY AND VERIFIED WITH:     C. MERCER RN 10:20 03/10/13 (wilsonm)    Anti-infectives   Start     Dose/Rate Route Frequency Ordered Stop   03/14/13 1600  metroNIDAZOLE (FLAGYL) 50 mg/ml oral suspension 500 mg     500 mg Oral 3 times daily 03/14/13 1202     03/11/13 1000  ciprofloxacin (CIPRO) IVPB 400 mg     400 mg 200 mL/hr over 60 Minutes Intravenous Every 24 hours 03/11/13 0920     03/11/13 0900  vancomycin (VANCOCIN) IVPB 1000 mg/200 mL premix  Status:  Discontinued     1,000 mg 200 mL/hr over 60 Minutes Intravenous Every 48 hours 03/11/13 0802 03/11/13 0900   03/10/13 2200  ceFEPIme (MAXIPIME) 500 mg in dextrose 5 % 50 mL IVPB  Status:  Discontinued     500 mg 100 mL/hr over 30 Minutes Intravenous Every 24 hours 03/09/13 2100 03/14/13 1203   03/10/13 1200  vancomycin (VANCOCIN) 500 mg in sodium chloride irrigation 0.9 % 100 mL ENEMA     500 mg Rectal 4 times per day 03/10/13 1033 03/12/13 1731   03/10/13 1200  vancomycin (VANCOCIN) 50 mg/mL oral solution 500 mg     500 mg Oral 4 times per day 03/10/13 1033     03/09/13 2359  vancomycin (VANCOCIN) 500 mg in sodium chloride 0.9 % 100 mL IVPB     500 mg 100 mL/hr over 60 Minutes Intravenous  Once 03/09/13 2310 03/10/13 0131   03/09/13 2200  ceFEPIme (MAXIPIME) 2 g in dextrose 5 % 50 mL IVPB  Status:  Discontinued     2 g 100 mL/hr over 30 Minutes Intravenous  Once 03/09/13 2153 03/09/13 2157   03/09/13 2200  metroNIDAZOLE (FLAGYL) tablet 500 mg  Status:  Discontinued     500 mg Oral 3 times per day 03/09/13 2004 03/09/13 2108   03/09/13 2200  ceFEPIme (MAXIPIME) 2 g in dextrose 5 % 50 mL IVPB     2 g 100 mL/hr over 30 Minutes  Intravenous  Once 03/09/13 2100 03/09/13 2230   03/09/13 2115  metroNIDAZOLE (FLAGYL) IVPB 500 mg  Status:  Discontinued     500 mg 100 mL/hr over 60 Minutes Intravenous Every 8 hours 03/09/13 2108 03/14/13 1202   03/09/13 2015  vancomycin (VANCOCIN) 50 mg/mL oral solution 125 mg  Status:  Discontinued     125 mg Oral 4 times per day 03/09/13 2004 03/10/13 1033   03/09/13 1815  vancomycin (VANCOCIN) IVPB 1000 mg/200 mL premix     1,000 mg 200 mL/hr over 60 Minutes Intravenous  Once 03/09/13 1807 03/09/13 2003   03/09/13 1815  piperacillin-tazobactam (ZOSYN) IVPB 3.375 g     3.375 g 100 mL/hr over 30 Minutes Intravenous  Once 03/09/13 1807 03/09/13 1902      Assessment: 57 y.o. F narrowed to Cipro (total abx D#6) for Enterobacter  bacteremia. Flagyl + Vanc po for CDiff. Afeb. WBC down to 10.3. LA down to nl. SCr down to 3.38. CrCl ~20 ml/min.   Vanc IV 5/7 >> 5/9 Cefepime 5/7 >>5/12 Vanc enema 5/8 >> 5/10 Flagyl 5/7 >> Vanc po 5/7 >> Cipro 5/9 >>  5/7 UCx >> Enterobacter cloacae 5/7 BCx >> 1/2 Enterobacter Cloacae (Sensitive to Cipro, Gent, Levo, Nitro, Tobra, Bactrim; Resistant to Ancef, Rocephin, zosyn) 5/7 CDiff >> positive  Goal of Therapy:  Proper antibiotics for infection/cultures adjusted for renal/hepatic function   Plan:  1. Continue Cipro 400 mg IV every 24 hours. Pt will need 14 days of therapy. 2. Will continue to follow renal function and planned LOT. 3. Consider placing stop day of 14 days on Cdiff treatment.  Christoper Fabian, PharmD, BCPS Clinical pharmacist, pager [phone] 03/14/2013 12:05 PM

## 2013-03-14 NOTE — Procedures (Signed)
Extubation Procedure Note  Patient Details:   Name: Krista [name] DOB: [birth year] MRN: [id number]   Airway Documentation:  Airway 7.5 mm (Active)  Secured at (cm) 23 cm 03/14/2013  9:34 AM  Measured From Lips 03/14/2013  9:34 AM  Secured Location Right 03/14/2013  9:34 AM  Secured By [employer] 03/14/2013  9:34 AM  Tube Holder Repositioned Yes 03/14/2013  9:34 AM  Cuff Pressure (cm H2O) 22 cm H2O 03/14/2013  9:34 AM  Site Condition Dry 03/14/2013  4:00 AM    Evaluation  O2 sats: stable throughout Complications: No apparent complications Patient did tolerate procedure well. Bilateral Breath Sounds: Rhonchi Suctioning: Airway No  Ok Anis, MA 03/14/2013, 1:44 PM

## 2013-03-14 NOTE — Progress Notes (Signed)
NUTRITION FOLLOW UP / CONSULT  Intervention:    If unable to extubate, recommend change TF to Jevity 1.2 at 45 ml/h with Prostat 30 ml TID to provide 1596 kcals, 105 gm protein, 875 ml free water daily.  When extubated, consider swallow evaluation with SLP to determine safest diet consistency.  Nutrition Dx:   Inadequate oral intake related to inability to eat as evidenced by NPO status. Ongoing.  Goal:   Intake to meet >90% of estimated nutrition needs. Unmet.  Monitor:   TF tolerance/adequacy, weight trend, labs, vent status.  Assessment:   Patient has been intubated since 5/7. Sepsis protocol is in place. Nepro TF was ordered yesterday afternoon, currently running at 30 ml/h providing 1296 kcals, 58 gm protein, 523 ml free water daily. Patient with AKI, likely rhabdomyolysis. Per discussion in ICU rounds, plans are to hold TF for possible extubation this morning. Patient with extensive areas of deep tissue injury, patchy areas developing into stage II's. Sacrum has developed into an unstageable wound per WOC RN notes. C. Diff was positive on 5/7. Diarrhea is decreasing with treatment of C. Diff.   Patient is currently intubated on ventilator support.  MV: 8.9 Temp:Temp (24hrs), Avg:97.4 F (36.3 C), Min:96.5 F (35.8 C), Max:98.2 F (36.8 C)   Height: Ht Readings from Last 1 Encounters:  03/09/13 5' 4.96" (1.65 m)    Weight Status:   Wt Readings from Last 1 Encounters:  03/14/13 210 lb 8.6 oz (95.5 kg)  03/09/13  173 lb 1 oz (78.5 kg)  Weight is up with positive fluid status.  BMI using admission weight of 78.5 kg is 28.8.  Re-estimated needs:  Kcal: 1700 Protein: 100-115 gm Fluid: > 2 L  Skin: Extensive areas of deep tissue injury, patchy areas developing into stage II's. Sacrum has developed into an unstageable wound.  Diet Order:  NPO   Intake/Output Summary (Last 24 hours) at 03/14/13 1141 Last data filed at 03/14/13 1000  Gross per 24 hour  Intake  3377.93 ml  Output   1255 ml  Net 2122.93 ml    Last BM: 5/12 (diarrhea)   Labs:   Recent Labs Lab 03/11/13 1556  03/13/13 2035 03/14/13 0400 03/14/13 0911  NA 124*  < > 124* 125* 125*  K 3.5  < > 3.8 3.3* 3.4*  CL 95*  < > 92* 94* 93*  CO2 12*  < > 22 23 24   BUN 48*  < > 49* 50* 49*  CREATININE 4.99*  < > 3.97* 3.71* 3.38*  CALCIUM 5.5*  < > 6.3* 6.3* 6.3*  MG 0.8*  --   --   --   --   PHOS  --   --   --  3.2  --   GLUCOSE 202*  < > 142* 168* 153*  < > = values in this interval not displayed.  CBG (last 3)   Recent Labs  03/13/13 2351 03/14/13 0411 03/14/13 0809  GLUCAP 143* 158* 148*    Scheduled Meds: . antiseptic oral rinse  15 mL Mouth Rinse QID  . aspirin  81 mg Per Tube Daily  . ceFEPime (MAXIPIME) IV  500 mg Intravenous Q24H  . chlorhexidine  15 mL Mouth Rinse BID  . ciprofloxacin  400 mg Intravenous Q24H  . collagenase  1 application Topical Daily  . famotidine (PEPCID) IV  20 mg Intravenous Q24H  . heparin  5,000 Units Subcutaneous Q8H  . insulin aspart  0-15 Units Subcutaneous Q4H  .  metronidazole  500 mg Intravenous Q8H  . vancomycin  500 mg Oral Q6H    Continuous Infusions: . feeding supplement (NEPRO CARB STEADY) 1,000 mL (03/13/13 1433)  . fentaNYL infusion INTRAVENOUS Stopped (03/14/13 0800)  . insulin (NOVOLIN-R) infusion Stopped (03/11/13 0047)  .  sodium bicarbonate 150 mEq in sterile water 1000 mL infusion 50 mL/hr at 03/14/13 0254  . vasopressin (PITRESSIN) infusion - *FOR SHOCK* Stopped (03/14/13 0800)    Joaquin Courts, RD, LDN, CNSC Pager [phone] After Hours Pager [phone]

## 2013-03-14 NOTE — Consult Note (Addendum)
WOC follow-up : Extensive areas of deep tissue injury, patchy areas developing into stage II's.  Sacrum has developed into an unstageable 5.5cm X 6cm, 50% red, 50% yellow.  Remains on Sport low air loss bed to reduce pressure.  Flexiseal in place, still leaking some stool around the tube which will make it difficult to keep from soiling the wound.  Continue foam dressings over open areas. Santyl ointment to sacrum for chemical debridement to assist with removal of nonviable tissue. Pt remains critically ill with multiple systemic factors which may impair healing. Norva Karvonen RN, MSN student Cammie Mcgee MSN, RN, [place], [place], CNS [phone]

## 2013-03-14 NOTE — Progress Notes (Signed)
CRITICAL VALUE ALERT  Critical value received:  Calcium 6.3  Date of notification:  03/14/13  Time of notification:   0447  Critical value read back:yes  Nurse who received alert:  [id number]  MD notified (1st page):  Dr.Deterding  Time of first page:  0447  MD notified (2nd page):  Time of second page:  Responding MD:  Dr.Deterding  Time MD responded: [phone]

## 2013-03-14 NOTE — Progress Notes (Signed)
PULMONARY  / CRITICAL CARE MEDICINE  Name: Krista [name] MRN: [id number] DOB: 01/08/1956    ADMISSION DATE:  03/09/2013 CONSULTATION DATE:  03/09/13   REFERRING MD :  Lynelle Doctor PRIMARY SERVICE: PCCM  CHIEF COMPLAINT:  Septic shock   BRIEF PATIENT DESCRIPTION: 57 y/o female with recent c.diff and schizophrenia was brought in to [facility] ED on 5/7 by EMS after her family said she had been unresponsive for 24 hours.  She was in septic shock, AKI, and had diarrhea and a UTI.  SIGNIFICANT EVENTS / STUDIES:  5/7 CT head >>neg acute 5/7 CT neck >>neg acute 5/8 RUQ U/S >>chronic medical renal disease, Trace bilateral perinephric fluid 5/8 CT abdo/pelvis>>>Increased abdominal ascites and mesenteric edema. Inflammatory changes surround the pancreas. Question pancreatitis. New bilateral pleural effusions, right greater than left, with near total collapse of the right lower lobe. Multiple thoracolumbar compression fractures are stable. 5/8 Ct >>>neg edema, acute  LINES / TUBES: 5/7 ETT (EDP) >>5/12 5/7 L [place] CVL (EDP) >>5/8 transfer out 5/8 rt IJ>>>  CULTURES: 5/7 blood >>>enterobacter S to cipro 5/7 urine >>enterobacter S to cipro 5/7 c.diff >>POS  ANTIBIOTICS: 5/7 vanc >>5/9 5/7 zosyn x1 5/7 cefepime >> 5/7 vanc oral >> 5/7 flagyl oral >> 5/8 enema vanc>>>plan stop 10th  SUBJECTIVE: Afebrile, decreased diarrhea Off pressors, improved renal fxn, improved mental status  VITAL SIGNS: Temp:  [96.5 F (35.8 C)-98.2 F (36.8 C)] 98.1 F (36.7 C) (05/12 1000) Pulse Rate:  [67-114] 114 (05/12 1000) Resp:  [9-22] 16 (05/12 1000) BP: (78-139)/(42-87) 103/70 mmHg (05/12 1000) SpO2:  [79 %-100 %] 99 % (05/12 1000) FiO2 (%):  [30 %] 30 % (05/12 1000) Weight:  [95.5 kg (210 lb 8.6 oz)] 95.5 kg (210 lb 8.6 oz) (05/12 0500) HEMODYNAMICS: CVP:  [7 mmHg-9 mmHg] 7 mmHg VENTILATOR SETTINGS: Vent Mode:  [-] CPAP FiO2 (%):  [30 %] 30 % Set Rate:  [20 bmp] 20 bmp Vt Set:  [550 mL] 550 mL PEEP:  [5  cmH20] 5 cmH20 Pressure Support:  [5 cmH20-10 cmH20] 5 cmH20 Plateau Pressure:  [22 cmH20-25 cmH20] 22 cmH20 INTAKE / OUTPUT: Intake/Output     05/11 0701 - 05/12 0700 05/12 0701 - 05/13 0700   I.V. (mL/kg) 2139.7 (22.4) 221.5 (2.3)   Other 50    NG/GT 460 120   IV Piggyback 1110    Total Intake(mL/kg) 3759.7 (39.4) 341.5 (3.6)   Urine (mL/kg/hr) 1190 (0.5) 225 (0.5)   Stool 5 (0)    Total Output 1195 225   Net +2564.7 +116.5          PHYSICAL EXAMINATION:  Gen: grimacing, moving all ext HEENT: collar wnl PULM: Rhonchi bilaterally unchanged CV: s1 s2 rrr AB: BS increased, nontender, no hsm Ext: warm, increased edema 3 plus Neuro:  lt pupil 4mm NRTL, Lt 3mm RTL, slight weakness rt with spont movement, follows  commands  LABS:  Recent Labs Lab 03/09/13 1747  03/09/13 1759 03/09/13 1801  03/09/13 2215  03/10/13 0400  03/10/13 1105  03/10/13 1454  03/11/13 0435  03/11/13 0930 03/11/13 1556  03/12/13 0445  03/13/13 0417 03/13/13 0446  03/13/13 2035 03/14/13 0400 03/14/13 0525 03/14/13 0911 03/14/13 1103  HGB  --   --  10.7*  --   < > 11.8*  < >  --   < > 11.6*  --   --   --  11.9*  --   --   --   --  10.4*  --   --  10.0*  --   --  8.1*  --   --   --   WBC  --   < > 25.4*  --   --  39.3*  --   --   --  45.2*  --   --   --  32.5*  --   --   --   --  24.9*  --   --  21.5*  --   --  10.3  --   --   --   PLT  --   < > 426*  --   --  366  --   --   --  462*  --   --   --  366  --   --   --   --  291  --   --  218  --   --  127*  --   --   --   NA  --   --  108*  --   < > 120*  < >  --   < > 127*  < > 123*  < > 123*  < >  --  124*  < > 123*  < >  --  125*  < > 124* 125*  --  125*  --   K  --   --  3.1*  --   < > 2.7*  < >  --   < > 3.4*  < > 3.1*  < > 3.6  < >  --  3.5  < > 3.2*  < >  --  3.4*  < > 3.8 3.3*  --  3.4*  --   CL  --   --  72*  --   < > 91*  < >  --   < > 99  < > 94*  < > 95*  < >  --  95*  < > 93*  < >  --  92*  < > 92* 94*  --  93*  --   CO2  --   < > 11*   --   --  8*  < >  --   --  11*  < > 11*  < > 10*  < >  --  12*  < > 15*  < >  --  20  < > 22 23  --  24  --   GLUCOSE  --   --  77  --   < > 150*  < >  --   < > 287*  < > 269*  < > 203*  < >  --  202*  < > 153*  < >  --  123*  < > 142* 168*  --  153*  --   BUN  --   --  64*  --   < > 49*  < >  --   < > 42*  < > 44*  < > 46*  < >  --  48*  < > 48*  < >  --  49*  < > 49* 50*  --  49*  --   CREATININE  --   --  8.29*  --   < > 5.71*  < >  --   < > 4.45*  < > 4.73*  < > 4.92*  < >  --  4.99*  < > 4.59*  < >  --  4.37*  < >  3.97* 3.71*  --  3.38*  --   CALCIUM  --   < > 6.0*  --   --  4.5*  < >  --   --  4.0*  < > 5.0*  < > 5.0*  < >  --  5.5*  < > 5.6*  < >  --  5.9*  < > 6.3* 6.3*  --  6.3*  --   MG  --   --   --   --   --   --   --   --   --   --   --   --   --   --   --   --  0.8*  --   --   --   --   --   --   --   --   --   --   --   PHOS  --   --   --   --   --   --   --   --   --   --   --   --   --   --   --   --   --   --   --   --   --   --   --   --  3.2  --   --   --   AST  --   --  233*  --   --   --   < >  --   --   --   --   --   --  292*  --   --  233*  --  188*  --   --   --   --   --   --   --   --   --   ALT  --   --  83*  --   --   --   < >  --   --   --   --   --   --  82*  --   --  75*  --  61*  --   --   --   --   --   --   --   --   --   ALKPHOS  --   --  848*  --   --   --   < >  --   --   --   --   --   --  542*  --   --  533*  --  450*  --   --   --   --   --   --   --   --   --   BILITOT  --   --  1.7*  --   --   --   < >  --   --   --   --   --   --  0.7  --   --  0.7  --  0.6  --   --   --   --   --   --   --   --   --   PROT  --   --  5.2*  --   --   --   < >  --   --   --   --   --   --  3.8*  --   --  3.9*  --  3.7*  --   --   --   --   --   --   --   --   --   ALBUMIN  --   --  2.1*  --   --   --   < >  --   --   --   --   --   --  1.4*  --   --  1.4*  --  1.3*  --   --   --   --   --  1.3*  --   --   --   APTT  --   --   --   --   --  34  --   --   --   --   --   --   --    --   --   --   --   --   --   --   --   --   --   --   --   --   --   --   INR  --   --  1.20  --   --  1.37  --   --   --   --   --   --   --   --   --   --   --   --   --   --   --   --   --   --   --   --   --   --   LATICACIDVEN  --   --   --   --   < >  --   --  2.4*  --  2.5*  --   --   --   --   --  1.3  --   --   --   --   --   --   --   --   --   --   --   --   TROPONINI  --   --   --  1.11*  --   --   --   --   --   --   --  2.34*  --   --   --   --   --   --   --   --   --   --   --   --   --   --   --   --   PROCALCITON 7.61  --   --   --   --  3.60  --   --   --   --   --   --   --   --   --   --   --   --   --   --   --   --   --   --   --   --   --   --   PHART  --   --   --   --   < >  --   < >  --   < >  --   < >  --   < >  --   < >  --   --   --   --   < > 7.460*  --   --   --   --  7.539*  --  7.473*  PCO2ART  --   --   --   --   < >  --   < >  --   < >  --   < >  --   < >  --   < >  --   --   --   --   < >  25.9*  --   --   --   --  27.4*  --  30.9*  PO2ART  --   --   --   --   < >  --   < >  --   < >  --   < >  --   < >  --   < >  --   --   --   --   < > 71.7*  --   --   --   --  82.6  --  85.0  < > = values in this interval not displayed.  Recent Labs Lab 03/13/13 2031 03/13/13 2351 03/14/13 0411 03/14/13 0809 03/14/13 1121  GLUCAP 136* 143* 158* 148* 158*    CXR: 5/10  ETT, CVL in place, bibasilar effusions  ASSESSMENT / PLAN:  PULMONARY A: Acute respiratory failure due to increased work of breathing from sepsis and inability to protect airway from encephalopathy P:   -Extubate , tolerates SBTs    CARDIOVASCULAR A: Septic shock - resolved NSTEMI> demand ischaemia Echo - nml LV fn P:  -Off levophed/vasopressin -stress dose steroids given refractory shock, dc cortisol 73   RENAL A:  AKI due to septic shock and rhabdomyolysis Hyponatremia in setting of AKI, profound volume depletion; relatively fast drop -hypocalcemia AG and NONAG  Acidosis [place]- -no  hydro  P:   -OK to normalise NA  now since > 48 h -bmet to q12h -repeat calcium gluconate, needs even after correction albumin - chk ionised Ca  -with rhabdo , may consider lasix with volume --dc bicarb for NON AG (stool)  GASTROINTESTINAL A:  C.diff colitis Elevated Alk phos and AST, biliary disease?  Likely rhabdo CT without ischemia   P:   -see ID -ct TFs -avoid ppi cdiff, pepcid  HEMATOLOGIC A:  Leukocytosis, from cdiff, dvt prevention P:  Sub q hep Cbc in am   INFECTIOUS A:  Gram neg Septic shock due to UTI and c.diff and cellulitis/skin breakdown on buttocks P:   -oral vanc, IV flagyl -vanc enema x 3 days -continue cipro IV, dc cefepime   ENDOCRINE A:  DM2, hyperglycemia, no evidence rel AI P:   -monitor glucose closely -stress dose HC, dc -Off insulin drip -ssi moderate  NEUROLOGIC A:  Acute encephalopathy from septic shock, concern Na rise (at risk CPD), unlikley neck injury (cant clear yet clinically yet, r/o anoxia) -improved mental status P:   -Na in better range -when she can reliably deny neck pain , will dc collar post extubation -avoid benzo -defer mri brain since better -add asa until cva r/o -PT  Code status: NCB, may need to reverse since improved  TODAY'S SUMMARY: Improved all around, extubate & mobilise, resolving ATN  I have personally obtained a history, examined the patient, evaluated laboratory and imaging results, formulated the assessment and plan and placed orders. CRITICAL CARE: The patient is critically ill with multiple organ systems failure and requires high complexity decision making for assessment and support, frequent evaluation and titration of therapies, application of advanced monitoring technologies and extensive interpretation of multiple databases. Critical Care Time devoted to patient care services described in this note is 45 minutes.   [facility] V.,MD Pulmonary and Critical Care Medicine [place]  [facility] Pager: [phone]  03/14/2013, 12:05 PM

## 2013-03-14 NOTE — Progress Notes (Signed)
eLink Physician-Brief Progress Note Patient Name: Krista [name] DOB: [birth year] MRN: [id number]  Date of Service  03/14/2013   HPI/Events of Note  Hypokalemia   eICU Interventions  Potassium replaced   Intervention Category Intermediate Interventions: Electrolyte abnormality - evaluation and management  Kenn Rekowski 03/14/2013, 6:01 AM

## 2013-03-15 ENCOUNTER — Inpatient Hospital Stay (HOSPITAL_COMMUNITY): Payer: Medicare Other

## 2013-03-15 LAB — RENAL FUNCTION PANEL
Albumin: 1.5 g/dL — ABNORMAL LOW (ref 3.5–5.2)
BUN: 49 mg/dL — ABNORMAL HIGH (ref 6–23)
Creatinine, Ser: 2.94 mg/dL — ABNORMAL HIGH (ref 0.50–1.10)
GFR calc non Af Amer: 17 mL/min — ABNORMAL LOW (ref >90)
Phosphorus: 3.8 mg/dL (ref 2.3–4.6)
Potassium: 3.5 mEq/L (ref 3.5–5.1)

## 2013-03-15 LAB — BASIC METABOLIC PANEL
BUN: 49 mg/dL — ABNORMAL HIGH (ref 6–23)
CO2: 26 mEq/L (ref 19–32)
CO2: 26 mEq/L (ref 19–32)
Calcium: 7.6 mg/dL — ABNORMAL LOW (ref 8.4–10.5)
Chloride: 94 mEq/L — ABNORMAL LOW (ref 96–112)
Creatinine, Ser: 2.86 mg/dL — ABNORMAL HIGH (ref 0.50–1.10)
Creatinine, Ser: 2.62 mg/dL — ABNORMAL HIGH (ref 0.50–1.10)
Glucose, Bld: 121 mg/dL — ABNORMAL HIGH (ref 70–99)

## 2013-03-15 LAB — CALCIUM, IONIZED: Calcium, Ion: 1.02 mmol/L — ABNORMAL LOW (ref 1.12–1.23)

## 2013-03-15 LAB — GLUCOSE, CAPILLARY
Glucose-Capillary: 115 mg/dL — ABNORMAL HIGH (ref 70–99)
Glucose-Capillary: 118 mg/dL — ABNORMAL HIGH (ref 70–99)
Glucose-Capillary: 142 mg/dL — ABNORMAL HIGH (ref 70–99)

## 2013-03-15 LAB — CULTURE, BLOOD (ROUTINE X 2)

## 2013-03-15 LAB — CBC
HCT: 24.2 % — ABNORMAL LOW (ref 36.0–46.0)
MCHC: 36 g/dL (ref 30.0–36.0)
Platelets: 126 10*3/uL — ABNORMAL LOW (ref 150–400)
RDW: 14.7 % (ref 11.5–15.5)

## 2013-03-15 MED ORDER — JEVITY 1.2 CAL PO LIQD
1000.0000 mL | ORAL | Status: DC
  Administered 2013-03-15: 19:00:00
  Filled 2013-03-15 (×4): qty 1000

## 2013-03-15 MED ORDER — PRO-STAT SUGAR FREE PO LIQD
30.0000 mL | Freq: Three times a day (TID) | ORAL | Status: DC
  Administered 2013-03-15 – 2013-03-16 (×3): 30 mL
  Filled 2013-03-15 (×5): qty 30

## 2013-03-15 MED ORDER — POTASSIUM CHLORIDE 20 MEQ/15ML (10%) PO LIQD
40.0000 meq | Freq: Once | ORAL | Status: AC
  Administered 2013-03-15: 40 meq
  Filled 2013-03-15: qty 30

## 2013-03-15 MED ORDER — FUROSEMIDE 10 MG/ML IJ SOLN
40.0000 mg | Freq: Once | INTRAMUSCULAR | Status: AC
  Administered 2013-03-15: 40 mg via INTRAVENOUS
  Filled 2013-03-15: qty 4

## 2013-03-15 MED ORDER — SODIUM CHLORIDE 0.9 % IV SOLN
1.0000 g | Freq: Once | INTRAVENOUS | Status: AC
  Administered 2013-03-15: 1 g via INTRAVENOUS
  Filled 2013-03-15: qty 10

## 2013-03-15 MED ORDER — MAGNESIUM SULFATE 50 % IJ SOLN
6.0000 g | Freq: Once | INTRAVENOUS | Status: AC
  Administered 2013-03-15: 6 g via INTRAVENOUS
  Filled 2013-03-15 (×3): qty 12

## 2013-03-15 NOTE — Progress Notes (Signed)
NUTRITION FOLLOW UP  Intervention:    Change TF to Jevity 1.2 at 50 ml/h with Prostat 30 ml TID to provide 1740 kcals, 112 gm protein, 972 ml free water daily.  Recommend swallow evaluation with SLP to determine safest diet consistency before advancing diet.  Nutrition Dx:   Inadequate oral intake related to inability to eat as evidenced by NPO status. Ongoing.  Goal:   Intake to meet >90% of estimated nutrition needs. Unmet.  Monitor:   TF tolerance/adequacy, weight trend, labs, ability to advance PO diet.  Assessment:   Patient was extubated yesterday afternoon. TF was resumed via NGT yesterday evening. Nepro TF is currently on hold. Patient pulled NGT earlier today; it was replaced and RN is awaiting results of KUB to verify placement before resuming TF. Given improved renal function with low potassium level, do not feel that patient needs a renal restricted TF formula. Electrolytes are being repleted. Patient is not alert enough for a swallow evaluation at this time per discussion with RN.  Height: Ht Readings from Last 1 Encounters:  03/09/13 5' 4.96" (1.65 m)    Weight Status:   Wt Readings from Last 1 Encounters:  03/15/13 209 lb 14.1 oz (95.2 kg)  03/14/13  210 lb 8.6 oz (95.5 kg)  03/09/13  173 lb 1 oz (78.5 kg)  Weight up with overall positive fluid status; trending back down now.  BMI using admission weight of 78.5 kg is 28.8.  Re-estimated needs:  Kcal: 1700-1900 Protein: 100-115 gm Fluid: > 2 L  Skin: Extensive areas of deep tissue injury, patchy areas developing into stage II's. Sacrum has developed into an unstageable wound.  Diet Order:  NPO   Intake/Output Summary (Last 24 hours) at 03/15/13 1447 Last data filed at 03/15/13 1200  Gross per 24 hour  Intake   1130 ml  Output   2925 ml  Net  -1795 ml    Last BM: 5/13 (diarrhea)   Labs:   Recent Labs Lab 03/11/13 1556  03/14/13 0400  03/14/13 2111 03/15/13 0500 03/15/13 0829  NA 124*  < >  125*  < > 126* 129* 129*  K 3.5  < > 3.3*  < > 3.6 3.5 3.4*  CL 95*  < > 94*  < > 93* 93* 94*  CO2 12*  < > 23  < > 25 26 26   BUN 48*  < > 50*  < > 50* 49* 49*  CREATININE 4.99*  < > 3.71*  < > 3.21* 2.94* 2.86*  CALCIUM 5.5*  < > 6.3*  < > 7.1* 7.0* 7.1*  MG 0.8*  --   --   --   --  1.0*  --   PHOS  --   --  3.2  --   --  3.8  --   GLUCOSE 202*  < > 168*  < > 117* 132* 138*  < > = values in this interval not displayed.  CBG (last 3)   Recent Labs  03/14/13 2350 03/15/13 0417 03/15/13 0742  GLUCAP 129* 118* 142*    Scheduled Meds: . antiseptic oral rinse  15 mL Mouth Rinse QID  . aspirin  81 mg Per Tube Daily  . chlorhexidine  15 mL Mouth Rinse BID  . ciprofloxacin  400 mg Intravenous Q24H  . collagenase  1 application Topical Daily  . famotidine  20 mg Per Tube QHS  . feeding supplement (NEPRO CARB STEADY)  1,000 mL Per Tube Q24H  .  furosemide  40 mg Intravenous Once  . heparin  5,000 Units Subcutaneous Q8H  . insulin aspart  0-15 Units Subcutaneous Q4H  . magnesium sulfate 1 - 4 g bolus IVPB  6 g Intravenous Once  . metroNIDAZOLE  500 mg Per Tube TID  . vancomycin  500 mg Oral Q6H    Continuous Infusions: None    Joaquin Courts, RD, LDN, CNSC Pager [phone] After Hours Pager [phone]

## 2013-03-15 NOTE — Evaluation (Signed)
Physical Therapy Evaluation Patient Details Name: Krista [name] MRN: [id number] DOB: June 29, 1956 Today's Date: 03/15/2013 Time: 6962-9528 PT Time Calculation (min): 22 min  PT Assessment / Plan / Recommendation Clinical Impression  Pt admit with sepsis, Cdiff, and history of schizophrenia.  Patient limited today by lethargy.  Called Daughter Velna Hatchet who states that pt is supposed to go to mental health to get shot 2 x month every 14 days and has not been since April 11.  She states she will get lethrgic and "like she is now" if she does not get that shot. Daughter states that pt bathed and cooked meals as well as went to grocery store as well as took care of house.  Daughter states that living situation is horrible and that 6 cats urinate and have BM's everywhere all over the house.  She states that she is not even sure that they will have a home to go home to as they may get evicted.    Pt was not participatory today.  Will place on PT trial to see if pt can progress mobility.  Will need SNF and needs SW c/s to address home issues.      PT Assessment  Patient needs continued PT services    Follow Up Recommendations  SNF;Supervision/Assistance - 24 hour         Barriers to Discharge Decreased caregiver support      Equipment Recommendations  None recommended by PT         Frequency Min 3X/week    Precautions / Restrictions Precautions Precautions: Fall Restrictions Weight Bearing Restrictions: No   Pertinent Vitals/Pain VSS, No pain      Mobility  Bed Mobility Bed Mobility: Supine to Sit;Sitting - Scoot to [place] of Bed Rolling Right: 1: +2 Total assist Rolling Right: Patient Percentage: 10% Right Sidelying to Sit: 1: +2 Total assist Right Sidelying to Sit: Patient Percentage: 10% Supine to Sit: 1: +2 Total assist Supine to Sit: Patient Percentage: 10% Sitting - Scoot to Edge of Bed: 2: Max assist Details for Bed Mobility Assistance: Pt not very responsive at this  time. Transfers Transfers: Not assessed Ambulation/Gait Ambulation/Gait Assistance: Not tested (comment) Stairs: No Wheelchair Mobility Wheelchair Mobility: No         PT Diagnosis: Generalized weakness  PT Problem List: Decreased strength;Decreased activity tolerance;Decreased balance;Decreased mobility;Decreased cognition;Decreased knowledge of use of DME;Decreased safety awareness;Decreased knowledge of precautions PT Treatment Interventions: DME instruction;Gait training;Functional mobility training;Therapeutic activities;Therapeutic exercise;Balance training;Patient/family education   PT Goals Acute Rehab PT Goals PT Goal Formulation: With patient Time For Goal Achievement: 03/29/13 Potential to Achieve Goals: Fair Pt will go Supine/Side to Sit: Independently PT Goal: Supine/Side to Sit - Progress: Goal set today Pt will go Sit to Supine/Side: with min assist PT Goal: Sit to Supine/Side - Progress: Goal set today Pt will go Sit to Stand: with min assist;with upper extremity assist PT Goal: Sit to Stand - Progress: Goal set today Pt will go Stand to Sit: with min assist;with upper extremity assist PT Goal: Stand to Sit - Progress: Goal set today Pt will Transfer Bed to Chair/Chair to Bed: with min assist PT Transfer Goal: Bed to Chair/Chair to Bed - Progress: Goal set today Pt will Ambulate: 16 - 50 feet;with min assist;with least restrictive assistive device PT Goal: Ambulate - Progress: Goal set today Pt will Go Up / Down Stairs: 3-5 stairs;with min assist;with least restrictive assistive device PT Goal: Up/Down Stairs - Progress: Goal set today  Visit Information  Last PT Received On: 03/15/13 Assistance Needed: +2    Subjective Data  Subjective: No verbalizations.  Blank stare. 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  Daughter states that living situation is horrible and that 6 cats urinate and have BM's everywhere all over the house.  She states that she is not even sure that they will have a home to go home to as they may get evicted.   Prior Function Level of Independence: Independent Able to Take Stairs?: Yes Driving: No Vocation: On disability Dominant Hand: Right    Cognition  Cognition Arousal/Alertness: Awake/alert Behavior During Therapy: WFL for tasks assessed/performed Overall Cognitive Status: History of cognitive impairments - at baseline    Extremity/Trunk Assessment Right Lower Extremity Assessment RLE ROM/Strength/Tone: Unable to fully assess;Due to impaired cognition Left Lower Extremity Assessment LLE ROM/Strength/Tone: Unable to fully assess;Due to impaired cognition Trunk Assessment Trunk Assessment: Kyphotic   Balance Static Sitting Balance Static Sitting - Balance Support: Feet supported;No upper extremity supported Static Sitting - Level of Assistance: 1: +2 Total assist;Other (comment) (pt = 0%) Static Sitting - Comment/# of Minutes: Demonstrated no sitting balance.  Questionably secondary to lethargy or Medications? Static Standing Balance Static Standing - Level of Assistance: Not tested (comment)  End of Session PT - End of Session Equipment Utilized During Treatment: Oxygen Activity Tolerance: Other (comment) (Limited by  lethargy) Patient left: with call bell/phone within reach;in bed Nurse Communication: Mobility status;Need for lift equipment  GP     INGOLD,Priseis Cratty 03/15/2013, 11:59 AM  [facility] [phone] (pager)

## 2013-03-15 NOTE — Progress Notes (Signed)
PULMONARY  / CRITICAL CARE MEDICINE  Name: Krista [name] MRN: [id number] DOB: 07/30/1956    ADMISSION DATE:  03/09/2013 CONSULTATION DATE:  03/09/13   REFERRING MD :  Lynelle Doctor PRIMARY SERVICE: PCCM  CHIEF COMPLAINT:  Septic shock   BRIEF PATIENT DESCRIPTION: 57 y/o female with recent c.diff and schizophrenia was brought in to [facility] ED on 5/7 by EMS after her family said she had been unresponsive for 24 hours.  She was in septic shock, AKI, and had diarrhea and a UTI.  SIGNIFICANT EVENTS / STUDIES:  5/7 CT head >>neg acute 5/7 CT neck >>neg acute 5/8 RUQ U/S >>chronic medical renal disease, Trace bilateral perinephric fluid 5/8 CT abdo/pelvis>>>Increased abdominal ascites and mesenteric edema. Inflammatory changes surround the pancreas. Question pancreatitis. New bilateral pleural effusions, right greater than left, with near total collapse of the right lower lobe. Multiple thoracolumbar compression fractures are stable. 5/8 Ct >>>neg edema, acute  LINES / TUBES: 5/7 ETT (EDP) >>5/12 5/7 L [place] CVL (EDP) >>5/8  5/8 rt IJ>>>  CULTURES: 5/7 blood >>>enterobacter S to cipro 5/7 urine >>enterobacter S to cipro 5/7 c.diff >>POS  ANTIBIOTICS: 5/7 vanc >>5/9 5/7 zosyn x1 5/7 cefepime >> 5/7 cipro >> 5/7 vanc oral >> 5/7 flagyl oral >> 5/8 enema vanc>>>plan stop 10th  SUBJECTIVE:  Afebrile, decreased diarrhea Remains off pressors, improving renal fxn,  mental status fluctuant  VITAL SIGNS: Temp:  [96.2 F (35.7 C)-97.7 F (36.5 C)] 97.7 F (36.5 C) (05/13 1200) Pulse Rate:  [84-122] 98 (05/13 1200) Resp:  [13-22] 16 (05/13 1200) BP: (90-133)/(52-109) 114/76 mmHg (05/13 1200) SpO2:  [96 %-100 %] 100 % (05/13 1200) Weight:  [95.2 kg (209 lb 14.1 oz)] 95.2 kg (209 lb 14.1 oz) (05/13 0434) HEMODYNAMICS:   VENTILATOR SETTINGS:   INTAKE / OUTPUT: Intake/Output     05/12 0701 - 05/13 0700 05/13 0701 - 05/14 0700   I.V. (mL/kg) 601.5 (6.3) 100 (1.1)   Other 140 110   NG/GT 400  70   IV Piggyback 200 110   Total Intake(mL/kg) 1341.5 (14.1) 390 (4.1)   Urine (mL/kg/hr) 2600 (1.1) 625 (0.9)   Stool 500 (0.2) 100 (0.1)   Total Output 3100 725   Net -1758.5 -335          PHYSICAL EXAMINATION:  Gen: chronically ill HEENT: no jvd PULM: Rhonchi bilaterally unchanged CV: s1 s2 rrr AB: BS increased, nontender, no hsm Ext: warm, increased edema 3 plus Neuro:  lt pupil 4mm NRTL, Lt 3mm RTL, slight weakness rt with spont movement,intermittent follows  commands  LABS:  Recent Labs Lab 03/09/13 1747  03/09/13 1759 03/09/13 1801  03/09/13 2215  03/10/13 0400  03/10/13 1105  03/10/13 1454  03/11/13 0435  03/11/13 0930 03/11/13 1556  03/12/13 0445  03/13/13 0417 03/13/13 0446  03/14/13 0400 03/14/13 0525  03/14/13 1103 03/14/13 2111 03/15/13 0500 03/15/13 0829  HGB  --   --  10.7*  --   < > 11.8*  < >  --   < > 11.6*  --   --   --  11.9*  --   --   --   --  10.4*  --   --  10.0*  --  8.1*  --   --   --   --  8.7*  --   WBC  --   < > 25.4*  --   --  39.3*  --   --   --  45.2*  --   --   --  32.5*  --   --   --   --  24.9*  --   --  21.5*  --  10.3  --   --   --   --  12.9*  --   PLT  --   < > 426*  --   --  366  --   --   --  462*  --   --   --  366  --   --   --   --  291  --   --  218  --  127*  --   --   --   --  126*  --   NA  --   --  108*  --   < > 120*  < >  --   < > 127*  < > 123*  < > 123*  < >  --  124*  < > 123*  < >  --  125*  < > 125*  --   < >  --  126* 129* 129*  K  --   --  3.1*  --   < > 2.7*  < >  --   < > 3.4*  < > 3.1*  < > 3.6  < >  --  3.5  < > 3.2*  < >  --  3.4*  < > 3.3*  --   < >  --  3.6 3.5 3.4*  CL  --   --  72*  --   < > 91*  < >  --   < > 99  < > 94*  < > 95*  < >  --  95*  < > 93*  < >  --  92*  < > 94*  --   < >  --  93* 93* 94*  CO2  --   < > 11*  --   --  8*  < >  --   --  11*  < > 11*  < > 10*  < >  --  12*  < > 15*  < >  --  20  < > 23  --   < >  --  25 26 26   GLUCOSE  --   --  77  --   < > 150*  < >  --   < > 287*  < >  269*  < > 203*  < >  --  202*  < > 153*  < >  --  123*  < > 168*  --   < >  --  117* 132* 138*  BUN  --   --  64*  --   < > 49*  < >  --   < > 42*  < > 44*  < > 46*  < >  --  48*  < > 48*  < >  --  49*  < > 50*  --   < >  --  50* 49* 49*  CREATININE  --   --  8.29*  --   < > 5.71*  < >  --   < > 4.45*  < > 4.73*  < > 4.92*  < >  --  4.99*  < > 4.59*  < >  --  4.37*  < > 3.71*  --   < >  --  3.21* 2.94* 2.86*  CALCIUM  --   < >  6.0*  --   --  4.5*  < >  --   --  4.0*  < > 5.0*  < > 5.0*  < >  --  5.5*  < > 5.6*  < >  --  5.9*  < > 6.3*  --   < >  --  7.1* 7.0* 7.1*  MG  --   --   --   --   --   --   --   --   --   --   --   --   --   --   --   --  0.8*  --   --   --   --   --   --   --   --   --   --   --  1.0*  --   PHOS  --   --   --   --   --   --   --   --   --   --   --   --   --   --   --   --   --   --   --   --   --   --   --  3.2  --   --   --   --  3.8  --   AST  --   --  233*  --   --   --   < >  --   --   --   --   --   --  292*  --   --  233*  --  188*  --   --   --   --   --   --   --   --   --   --   --   ALT  --   --  83*  --   --   --   < >  --   --   --   --   --   --  82*  --   --  75*  --  61*  --   --   --   --   --   --   --   --   --   --   --   ALKPHOS  --   --  848*  --   --   --   < >  --   --   --   --   --   --  542*  --   --  533*  --  450*  --   --   --   --   --   --   --   --   --   --   --   BILITOT  --   --  1.7*  --   --   --   < >  --   --   --   --   --   --  0.7  --   --  0.7  --  0.6  --   --   --   --   --   --   --   --   --   --   --   PROT  --   --  5.2*  --   --   --   < >  --   --   --   --   --   --  3.8*  --   --  3.9*  --  3.7*  --   --   --   --   --   --   --   --   --   --   --   ALBUMIN  --   --  2.1*  --   --   --   < >  --   --   --   --   --   --  1.4*  --   --  1.4*  --  1.3*  --   --   --   --  1.3*  --   --   --   --  1.5*  --   APTT  --   --   --   --   --  34  --   --   --   --   --   --   --   --   --   --   --   --   --   --   --   --   --   --    --   --   --   --   --   --   INR  --   --  1.20  --   --  1.37  --   --   --   --   --   --   --   --   --   --   --   --   --   --   --   --   --   --   --   --   --   --   --   --   LATICACIDVEN  --   --   --   --   < >  --   --  2.4*  --  2.5*  --   --   --   --   --  1.3  --   --   --   --   --   --   --   --   --   --   --   --   --   --   TROPONINI  --   --   --  1.11*  --   --   --   --   --   --   --  2.34*  --   --   --   --   --   --   --   --   --   --   --   --   --   --   --   --   --   --   PROCALCITON 7.61  --   --   --   --  3.60  --   --   --   --   --   --   --   --   --   --   --   --   --   --   --   --   --   --   --   --   --   --   --   --   PHART  --   --   --   --   < >  --   < >  --   < >  --   < >  --   < >  --   < >  --   --   --   --   < >  7.460*  --   --   --  7.539*  --  7.473*  --   --   --   PCO2ART  --   --   --   --   < >  --   < >  --   < >  --   < >  --   < >  --   < >  --   --   --   --   < > 25.9*  --   --   --  27.4*  --  30.9*  --   --   --   PO2ART  --   --   --   --   < >  --   < >  --   < >  --   < >  --   < >  --   < >  --   --   --   --   < > 71.7*  --   --   --  82.6  --  85.0  --   --   --   < > = values in this interval not displayed.  Recent Labs Lab 03/14/13 2106 03/14/13 2315 03/14/13 2350 03/15/13 0417 03/15/13 0742  GLUCAP 115* 115* 129* 118* 142*    CXR: 5/12 bibasilar effusions  ASSESSMENT / PLAN:  PULMONARY A: Acute respiratory failure due to increased work of breathing from sepsis and inability to protect airway from encephalopathy P:  Weak cough - observe in ICU Chest PT    CARDIOVASCULAR A: Septic shock - resolved NSTEMI> demand ischaemia Echo - nml LV fn P:  -Off levophed/vasopressin &-stress dose steroids - cortisol 73   RENAL A:  AKI due to septic shock and rhabdomyolysis Hyponatremia in setting of AKI, profound volume depletion; relatively fast drop -hypocalcemia/ Hypomag AG and NONAG  Acidosis [place]- -no  hydro  P:   -OK to normalise NA  now since > 48 h -bmet to q12h -replete calcium gluconate & Mag - lasix 40 daily --dc bicarb for NON AG (stool)  GASTROINTESTINAL A:  C.diff colitis Elevated Alk phos and AST, biliary disease?  Likely rhabdo CT without ischemia  Protein calorie malnutrition  P:   -ct TFs -avoid ppi cdiff, pepcid  HEMATOLOGIC A:  Leukocytosis, from cdiff, dvt prevention P:  Sub q hep Cbc in am   INFECTIOUS A:  Gram neg Septic shock due to UTI and c.diff and cellulitis/skin breakdown on buttocks P:   -oral vanc, IV flagyl -vanc enema x 3 days -continue cipro  x 10ds, dc cefepime   ENDOCRINE A:  DM2, hyperglycemia, no evidence rel AI P:   -monitor glucose closely -stress dose HC, dc -Off insulin drip -ssi moderate  NEUROLOGIC A:  Acute encephalopathy from septic shock, concern Na rise (at risk CPD), unlikley neck injury (cant clear yet clinically yet, r/o anoxia) -improved mental status P:   -Na in better range - dc'd collar  -avoid benzo -add asa until cva r/o -PT  Code status: NCB, doubt will reverse even though improved  TODAY'S SUMMARY: Improved all around, extubated, but weak cough, poor to mobilise, resolving ATN & repleting electrolytes  I have personally obtained a history, examined the patient, evaluated laboratory and imaging results, formulated the assessment and plan and placed orders. CRITICAL CARE: The patient is critically ill with multiple organ systems failure and requires high complexity decision making for assessment and support, frequent evaluation and titration  of therapies, application of advanced monitoring technologies and extensive interpretation of multiple databases. Critical Care Time devoted to patient care services described in this note is 32 minutes.   [facility] V[facility]MD Pulmonary and Critical Care Medicine [facility] Pager: [phone]  03/15/2013, 2:30 PM

## 2013-03-16 ENCOUNTER — Other Ambulatory Visit (HOSPITAL_COMMUNITY): Payer: Self-pay

## 2013-03-16 ENCOUNTER — Inpatient Hospital Stay
Admission: AD | Admit: 2013-03-16 | Discharge: 2013-05-10 | Disposition: A | Discharge status: Skilled Nursing Facility | Payer: Medicare Other | Source: Ambulatory Visit | Attending: Internal Medicine | Admitting: Internal Medicine

## 2013-03-16 DIAGNOSIS — I214 Non-ST elevation (NSTEMI) myocardial infarction: Secondary | ICD-10-CM | POA: Insufficient documentation

## 2013-03-16 DIAGNOSIS — R6521 Severe sepsis with septic shock: Secondary | ICD-10-CM

## 2013-03-16 DIAGNOSIS — J9600 Acute respiratory failure, unspecified whether with hypoxia or hypercapnia: Secondary | ICD-10-CM

## 2013-03-16 DIAGNOSIS — N390 Urinary tract infection, site not specified: Secondary | ICD-10-CM

## 2013-03-16 DIAGNOSIS — R109 Unspecified abdominal pain: Secondary | ICD-10-CM

## 2013-03-16 DIAGNOSIS — J189 Pneumonia, unspecified organism: Secondary | ICD-10-CM

## 2013-03-16 DIAGNOSIS — Z930 Tracheostomy status: Secondary | ICD-10-CM

## 2013-03-16 DIAGNOSIS — J9811 Atelectasis: Secondary | ICD-10-CM

## 2013-03-16 DIAGNOSIS — R410 Disorientation, unspecified: Secondary | ICD-10-CM

## 2013-03-16 DIAGNOSIS — N179 Acute kidney failure, unspecified: Secondary | ICD-10-CM

## 2013-03-16 LAB — BASIC METABOLIC PANEL
Calcium: 7.8 mg/dL — ABNORMAL LOW (ref 8.4–10.5)
GFR calc non Af Amer: 24 mL/min — ABNORMAL LOW (ref >90)
Glucose, Bld: 134 mg/dL — ABNORMAL HIGH (ref 70–99)
Sodium: 132 mEq/L — ABNORMAL LOW (ref 135–145)

## 2013-03-16 LAB — CULTURE, BLOOD (ROUTINE X 2)

## 2013-03-16 LAB — RENAL FUNCTION PANEL
CO2: 26 mEq/L (ref 19–32)
Chloride: 96 mEq/L (ref 96–112)
GFR calc Af Amer: 25 mL/min — ABNORMAL LOW (ref >90)
GFR calc non Af Amer: 21 mL/min — ABNORMAL LOW (ref >90)
Sodium: 129 mEq/L — ABNORMAL LOW (ref 135–145)

## 2013-03-16 LAB — GLUCOSE, CAPILLARY
Glucose-Capillary: 126 mg/dL — ABNORMAL HIGH (ref 70–99)
Glucose-Capillary: 158 mg/dL — ABNORMAL HIGH (ref 70–99)

## 2013-03-16 LAB — CBC
Platelets: 142 10*3/uL — ABNORMAL LOW (ref 150–400)
RBC: 2.73 MIL/uL — ABNORMAL LOW (ref 3.87–5.11)
WBC: 11.3 10*3/uL — ABNORMAL HIGH (ref 4.0–10.5)

## 2013-03-16 MED ORDER — FAMOTIDINE 40 MG/5ML PO SUSR: 20.0000 mg | Freq: Every day | ORAL | Status: DC

## 2013-03-16 MED ORDER — ALBUTEROL SULFATE HFA 108 (90 BASE) MCG/ACT IN AERS: 4.0000 | INHALATION_SPRAY | RESPIRATORY_TRACT | Status: DC | PRN

## 2013-03-16 MED ORDER — CHLORHEXIDINE GLUCONATE 0.12 % MT SOLN: 15.0000 mL | Freq: Two times a day (BID) | OROMUCOSAL | Status: DC

## 2013-03-16 MED ORDER — K PHOS MONO-SOD PHOS DI & MONO 155-852-130 MG PO TABS
500.0000 mg | ORAL_TABLET | Freq: Two times a day (BID) | ORAL | Status: DC
  Administered 2013-03-16: 500 mg via ORAL
  Filled 2013-03-16 (×2): qty 2

## 2013-03-16 MED ORDER — METRONIDAZOLE 50 MG/ML ORAL SUSPENSION: 500.0000 mg | Freq: Three times a day (TID) | ORAL | Status: DC

## 2013-03-16 MED ORDER — BENZTROPINE MESYLATE 1 MG PO TABS
1.0000 mg | ORAL_TABLET | Freq: Every day | ORAL | Status: DC
  Filled 2013-03-16: qty 1

## 2013-03-16 MED ORDER — HEPARIN SODIUM (PORCINE) 5000 UNIT/ML IJ SOLN: 5000.0000 [IU] | Freq: Three times a day (TID) | INTRAMUSCULAR | Status: DC

## 2013-03-16 MED ORDER — FLUPHENAZINE DECANOATE 25 MG/ML IJ SOLN: 25.0000 mg | INTRAMUSCULAR | Status: DC

## 2013-03-16 MED ORDER — ASPIRIN 81 MG PO CHEW: 81.0000 mg | CHEWABLE_TABLET | Freq: Every day | ORAL | Status: DC

## 2013-03-16 MED ORDER — VANCOMYCIN 50 MG/ML ORAL SOLUTION: 500.0000 mg | Freq: Four times a day (QID) | ORAL | Status: DC

## 2013-03-16 MED ORDER — BIOTENE DRY MOUTH MT LIQD: 15.0000 mL | Freq: Four times a day (QID) | OROMUCOSAL | Status: DC

## 2013-03-16 MED ORDER — MAGNESIUM SULFATE 40 MG/ML IJ SOLN
2.0000 g | Freq: Once | INTRAMUSCULAR | Status: AC
  Administered 2013-03-16: 2 g via INTRAVENOUS
  Filled 2013-03-16: qty 50

## 2013-03-16 MED ORDER — COLLAGENASE 250 UNIT/GM EX OINT: 1.0000 "application " | TOPICAL_OINTMENT | Freq: Every day | CUTANEOUS | Status: DC

## 2013-03-16 MED ORDER — CIPROFLOXACIN IN D5W 400 MG/200ML IV SOLN: 400.0000 mg | INTRAVENOUS | Status: DC

## 2013-03-16 MED ORDER — FLUPHENAZINE DECANOATE 25 MG/ML IJ SOLN
25.0000 mg | INTRAMUSCULAR | Status: DC
  Administered 2013-03-16: 25 mg via INTRAMUSCULAR
  Filled 2013-03-16: qty 1

## 2013-03-16 MED ORDER — PRO-STAT SUGAR FREE PO LIQD: 30.0000 mL | Freq: Three times a day (TID) | ORAL | Status: DC

## 2013-03-16 MED ORDER — OXYBUTYNIN CHLORIDE 5 MG PO TABS
5.0000 mg | ORAL_TABLET | Freq: Two times a day (BID) | ORAL | Status: DC
  Administered 2013-03-16: 5 mg via ORAL
  Filled 2013-03-16 (×2): qty 1

## 2013-03-16 MED ORDER — JEVITY 1.2 CAL PO LIQD: ORAL | Status: DC

## 2013-03-16 MED ORDER — INSULIN ASPART 100 UNIT/ML ~~LOC~~ SOLN: 0.0000 [IU] | SUBCUTANEOUS | Status: DC

## 2013-03-16 NOTE — Progress Notes (Signed)
Report given to Margaretha Glassing, nurse at [facility], at this time.  Patient remains stable.  VSS.  No s/s of any acute distress.

## 2013-03-16 NOTE — Progress Notes (Signed)
[facility]red [facility] to [facility] at this time.

## 2013-03-16 NOTE — Progress Notes (Addendum)
PULMONARY  / CRITICAL CARE MEDICINE  Name: Krista [name] MRN: [id number] DOB: 03-14-1956    ADMISSION DATE:  03/09/2013 CONSULTATION DATE:  03/09/13   REFERRING MD :  Lynelle Doctor PRIMARY SERVICE: PCCM  CHIEF COMPLAINT:  Septic shock   BRIEF PATIENT DESCRIPTION: 57 y/o female with recent c.diff and schizophrenia was brought in to [facility] ED on 5/7 by EMS after her family said she had been unresponsive for 24 hours.  She was in septic shock, AKI, and had diarrhea and a UTI.  SIGNIFICANT EVENTS / STUDIES:  5/7 CT head >>neg acute 5/7 CT neck >>neg acute 5/8 RUQ U/S >>chronic medical renal disease, Trace bilateral perinephric fluid 5/8 CT abdo/pelvis>>>Increased abdominal ascites and mesenteric edema. Inflammatory changes surround the pancreas. Question pancreatitis. New bilateral pleural effusions, right greater than left, with near total collapse of the right lower lobe. Multiple thoracolumbar compression fractures are stable. 5/8 Ct >>>neg edema, acute  LINES / TUBES: 5/7 ETT (EDP) >>5/12 5/7 L [place] CVL (EDP) >>5/8  5/8 rt IJ>>>  CULTURES: 5/7 blood >>>enterobacter S to cipro 5/7 urine >>enterobacter S to cipro 5/7 c.diff >>POS  ANTIBIOTICS: 5/7 vanc >>5/9 5/7 zosyn x1 5/7 cefepime >> 5/12 5/7 cipro >> 5/7 vanc oral >> 5/7 flagyl oral >> 5/8 enema vanc>>>plan stop 10th  SUBJECTIVE:  Afebrile, decreased diarrhea Polyuric with alsix,  mental status fluctuant  VITAL SIGNS: Temp:  [97.2 F (36.2 C)-98.8 F (37.1 C)] 98.6 F (37 C) (05/14 1300) Pulse Rate:  [91-120] 113 (05/14 1300) Resp:  [15-22] 20 (05/14 1300) BP: (94-141)/(57-84) 115/75 mmHg (05/14 1100) SpO2:  [94 %-100 %] 98 % (05/14 1300) Weight:  [94.4 kg (208 lb 1.8 oz)] 94.4 kg (208 lb 1.8 oz) (05/14 0500) HEMODYNAMICS:   VENTILATOR SETTINGS:   INTAKE / OUTPUT: Intake/Output     05/13 0701 - 05/14 0700 05/14 0701 - 05/15 0700   I.V. (mL/kg) 480 (5.1) 120 (1.3)   Other 150    NG/GT 555 315   IV Piggyback 110 450    Total Intake(mL/kg) 1295 (13.7) 885 (9.4)   Urine (mL/kg/hr) 2825 (1.2)    Stool 200 (0.1)    Total Output 3025     Net -1730 +885          PHYSICAL EXAMINATION:  Gen: chronically ill HEENT: no jvd PULM: Rhonchi bilaterally unchanged CV: s1 s2 rrr AB: BS increased, nontender, no hsm Ext: warm, increased edema 3 plus Neuro:  lt pupil 4mm NRTL, Lt 3mm RTL, slight weakness rt with spont movement,intermittent follows  commands  LABS:  Recent Labs Lab 03/09/13 1747  03/09/13 1759 03/09/13 1801  03/09/13 2215  03/10/13 0400  03/10/13 1105  03/10/13 1454  03/11/13 0435  03/11/13 0930 03/11/13 1556  03/12/13 0445  03/13/13 0417  03/14/13 0400 03/14/13 0525  03/14/13 1103  03/15/13 0500  03/15/13 2000 03/16/13 0330 03/16/13 0846  HGB  --   --  10.7*  --   < > 11.8*  < >  --   < > 11.6*  --   --   --  11.9*  --   --   --   --  10.4*  --   --   < > 8.1*  --   --   --   --  8.7*  --   --  8.0*  --   WBC  --   < > 25.4*  --   --  39.3*  --   --   --  45.2*  --   --   --  32.5*  --   --   --   --  24.9*  --   --   < > 10.3  --   --   --   --  12.9*  --   --  11.3*  --   PLT  --   < > 426*  --   --  366  --   --   --  462*  --   --   --  366  --   --   --   --  291  --   --   < > 127*  --   --   --   --  126*  --   --  142*  --   NA  --   --  108*  --   < > 120*  < >  --   < > 127*  < > 123*  < > 123*  < >  --  124*  < > 123*  < >  --   < > 125*  --   < >  --   < > 129*  < > 132* 129* 132*  K  --   --  3.1*  --   < > 2.7*  < >  --   < > 3.4*  < > 3.1*  < > 3.6  < >  --  3.5  < > 3.2*  < >  --   < > 3.3*  --   < >  --   < > 3.5  < > 3.8 3.5 3.7  CL  --   --  72*  --   < > 91*  < >  --   < > 99  < > 94*  < > 95*  < >  --  95*  < > 93*  < >  --   < > 94*  --   < >  --   < > 93*  < > 96 96 97  CO2  --   < > 11*  --   --  8*  < >  --   --  11*  < > 11*  < > 10*  < >  --  12*  < > 15*  < >  --   < > 23  --   < >  --   < > 26  < > 26 26 27   GLUCOSE  --   --  77  --   < > 150*  < >  --    < > 287*  < > 269*  < > 203*  < >  --  202*  < > 153*  < >  --   < > 168*  --   < >  --   < > 132*  < > 121* 98 134*  BUN  --   --  64*  --   < > 49*  < >  --   < > 42*  < > 44*  < > 46*  < >  --  48*  < > 48*  < >  --   < > 50*  --   < >  --   < > 49*  < > 48* 49* 47*  CREATININE  --   --  8.29*  --   < > 5.71*  < >  --   < >  4.45*  < > 4.73*  < > 4.92*  < >  --  4.99*  < > 4.59*  < >  --   < > 3.71*  --   < >  --   < > 2.94*  < > 2.62* 2.40* 2.17*  CALCIUM  --   < > 6.0*  --   --  4.5*  < >  --   --  4.0*  < > 5.0*  < > 5.0*  < >  --  5.5*  < > 5.6*  < >  --   < > 6.3*  --   < >  --   < > 7.0*  < > 7.6* 7.5* 7.8*  MG  --   --   --   --   --   --   --   --   --   --   --   --   --   --   --   --  0.8*  --   --   --   --   --   --   --   --   --   --  1.0*  --   --  1.8  --   PHOS  --   --   --   --   --   --   --   --   --   --   --   --   --   --   --   --   --   --   --   --   --   --  3.2  --   --   --   --  3.8  --   --  3.5  --   AST  --   --  233*  --   --   --   < >  --   --   --   --   --   --  292*  --   --  233*  --  188*  --   --   --   --   --   --   --   --   --   --   --   --   --   ALT  --   --  83*  --   --   --   < >  --   --   --   --   --   --  82*  --   --  75*  --  61*  --   --   --   --   --   --   --   --   --   --   --   --   --   ALKPHOS  --   --  848*  --   --   --   < >  --   --   --   --   --   --  542*  --   --  533*  --  450*  --   --   --   --   --   --   --   --   --   --   --   --   --   BILITOT  --   --  1.7*  --   --   --   < >  --   --   --   --   --   --  0.7  --   --  0.7  --  0.6  --   --   --   --   --   --   --   --   --   --   --   --   --   PROT  --   --  5.2*  --   --   --   < >  --   --   --   --   --   --  3.8*  --   --  3.9*  --  3.7*  --   --   --   --   --   --   --   --   --   --   --   --   --   ALBUMIN  --   --  2.1*  --   --   --   < >  --   --   --   --   --   --  1.4*  --   --  1.4*  --  1.3*  --   --   --  1.3*  --   --   --   --  1.5*  --   --   1.4*  --   APTT  --   --   --   --   --  34  --   --   --   --   --   --   --   --   --   --   --   --   --   --   --   --   --   --   --   --   --   --   --   --   --   --   INR  --   --  1.20  --   --  1.37  --   --   --   --   --   --   --   --   --   --   --   --   --   --   --   --   --   --   --   --   --   --   --   --   --   --   LATICACIDVEN  --   --   --   --   < >  --   --  2.4*  --  2.5*  --   --   --   --   --  1.3  --   --   --   --   --   --   --   --   --   --   --   --   --   --   --   --   TROPONINI  --   --   --  1.11*  --   --   --   --   --   --   --  2.34*  --   --   --   --   --   --   --   --   --   --   --   --   --   --   --   --   --   --   --   --  PROCALCITON 7.61  --   --   --   --  3.60  --   --   --   --   --   --   --   --   --   --   --   --   --   --   --   --   --   --   --   --   --   --   --   --   --   --   PHART  --   --   --   --   < >  --   < >  --   < >  --   < >  --   < >  --   < >  --   --   --   --   < > 7.460*  --   --  7.539*  --  7.473*  --   --   --   --   --   --   PCO2ART  --   --   --   --   < >  --   < >  --   < >  --   < >  --   < >  --   < >  --   --   --   --   < > 25.9*  --   --  27.4*  --  30.9*  --   --   --   --   --   --   PO2ART  --   --   --   --   < >  --   < >  --   < >  --   < >  --   < >  --   < >  --   --   --   --   < > 71.7*  --   --  82.6  --  85.0  --   --   --   --   --   --   < > = values in this interval not displayed.  Recent Labs Lab 03/15/13 1955 03/16/13 0013 03/16/13 0408 03/16/13 0822 03/16/13 1149  GLUCAP 144* 126* 98 141* 158*    CXR: 5/12 bibasilar effusions  ASSESSMENT / PLAN:  PULMONARY A: Acute respiratory failure due to increased work of breathing from sepsis and inability to protect airway from encephalopathy P:  Weak cough -Chest PT with vest    CARDIOVASCULAR A: Septic shock - resolved NSTEMI> demand ischaemia Echo - nml LV fn P:  -Off levophed/vasopressin &-stress dose steroids - cortisol  73   RENAL A:  AKI due to septic shock and rhabdomyolysis Hyponatremia in setting of AKI, profound volume depletion; relatively fast drop -hypocalcemia/ Hypomag AG and NONAG  Acidosis [place]- -no hydro  P:   -OK to normalise NA  now since > 48 h -bmet to q12h during polyuric phase -repleted calcium gluconate & Mag -dc  lasix  --dc bicarb  GASTROINTESTINAL A:  C.diff colitis Elevated Alk phos and AST, biliary disease?  Likely rhabdo CT without ischemia  Protein calorie malnutrition  P:   -ct TFs -avoid ppi cdiff, pepcid  HEMATOLOGIC A:  Leukocytosis, from cdiff, dvt prevention P:  Sub q hep Cbc in am   INFECTIOUS A:  Gram neg Septic shock due to UTI and c.diff and cellulitis/skin breakdown on  buttocks P:   -oral vanc, IV flagyl x -vanc enema x 3 days -continue cipro PO  x 10ds, dc cefepime   ENDOCRINE A:  DM2, hyperglycemia, no evidence rel AI P:   -Off insulin drip -ssi moderate  NEUROLOGIC A:  Acute encephalopathy from septic shock, concern Na rise (at risk CPD), unlikley neck injury (cant clear yet clinically yet, r/o anoxia) -improved mental status but concern for anoxic encephalopathy P:   - dc'd collar  -avoid benzo -add asa until cva r/o -PT -Give IM fluphenazine   Code status: NCB, doubt will reverse even though improved  TODAY'S SUMMARY: Improved all around, extubated, but weak cough, poor to mobilise, resolving ATN & repleting electrolytes  I have personally obtained a history, examined the patient, evaluated laboratory and imaging results, formulated the assessment and plan and placed orders.  OK to transfer to LTAC, I have updated daughter Marijo File  [facility] V.,MD Pulmonary and Critical Care Medicine [facility] Pager: [phone]  03/16/2013, 1:19 PM

## 2013-03-16 NOTE — Progress Notes (Signed)
Antibiotic LOT note for [facility]:  Pt on Cipro for enterobacter bacteremia/UTI. Needs total of 14 days of therapy - stop date should be 5/22.  Pt on Vanc and Flagyl per tube for Cdiff. Needs total of 14 days of therapy - stop date should be 5/20.  Thanks, Christoper Fabian, PharmD, BCPS Clinical pharmacist, pager [phone] 03/16/2013  12:17 PM

## 2013-03-16 NOTE — Discharge Summary (Signed)
Physician Discharge Summary  Patient ID: Krista [name] MRN: [id number] DOB/AGE: 57/09/1956 57 y.o.  Admit date: 03/09/2013 Discharge date: 03/16/2013    Discharge Diagnoses:  Principal Problem:   Severe sepsis with septic shock Active Problems:   Septic shock(785.52)    Brief Summary: Krista [name] is a 57 y.o. y/o female with a PMH of DM, schizophrenia, recent CDiff brought in to [facility] ED on 5/7 by EMS after her family said she had been unresponsive for 24 hours. Admitted with VDRF, septic shock, AKI, UTI.  Course c/b NSTEMI, encephalopathy, rhabdomyolysis, CDiff colitis and sacral decub.  Now extubated and ready for tx to LTAC for further abx, rehab efforts.    SIGNIFICANT EVENTS / STUDIES:  5/7 CT head >>neg acute  5/7 CT neck >>neg acute  5/8 RUQ U/S >>chronic medical renal disease, Trace bilateral perinephric fluid  5/8 CT abdo/pelvis>>>Increased abdominal ascites and mesenteric edema. Inflammatory changes surround the pancreas. Question pancreatitis. New bilateral pleural effusions, right greater than left, with near total collapse of the right lower lobe. Multiple thoracolumbar compression fractures are stable.  5/8 Ct >>>neg edema, acute   LINES / TUBES:  5/7 ETT (EDP) >>5/12 5/7 L [place] CVL (EDP) >>5/8  5/8 rt IJ>>>   CULTURES:  5/7 blood >>>enterobacter S to cipro  5/7 urine >>enterobacter S to cipro  5/7 c.diff >>POS   ANTIBIOTICS:  5/7 vanc >>5/9  5/7 zosyn x1  5/7 cefepime >> 5/12  5/7 cipro >>  5/7 vanc oral >>  5/7 flagyl oral >>  5/8 enema vanc>>>plan stop [facility] Summary by Discharge Diagnosis  Acute respiratory failure due to increased work of breathing from sepsis and inability to protect airway from encephalopathy. Vent support required from 5/7-5/12.  Still with very weak cough receiving chest PT with vibravest.   Septic Shock - in setting UTI, CDiff colitis. Treated with  abx as above. Required stress steroids and vasopressors intiially, now off.  NSTEMI - echo with normal LV funx as above.    AKI - r/t septic shock and rhabdomyolysis. Rx with volume.  Close monitoring chemistries.  US showed no hydro Hyponatremia - in setting AKI, profound volume depletion.  Na 108 on admit. Now near normal.   CDiff colitis - pepcid used for PUP and rx with oral vanc, IV flagyl.  No ischemia on CT.  Cont TF.   UTI - Cefepime initially, cont Cipro PO x10 days total. Other abx as above.   DM - initially required insulin gtt, now tol SSI, cont SSO moderate  Acute encephalopathy - r/t septic shock, hyponatremia, concern for anoxic injury. Cont to avoid benzos and add asa until CVA ruled out.   Pt is NCB although may need to readdress now that she is much improved.   Filed Vitals:   03/16/13 0700 03/16/13 0900 03/16/13 1100 03/16/13 1300  BP: 101/68 141/82 115/75   Pulse: 99 111 114 113  Temp: 98.5 F (36.9 C) 98.7 F (37.1 C) 98.8 F (37.1 C) 98.6 F (37 C)  TempSrc:      Resp: 17 22 22  20  Height:      Weight:      SpO2: 99% 94% 96% 98%     Discharge Labs  BMET  Recent Labs Lab 03/11/13 1556  03/14/13 0400  03/15/13 0500 03/15/13 0829 03/15/13 2000 03/16/13 0330 03/16/13 0846  NA 124*  < > 125*  < > 129* 129* 132* 129* 132*  K 3.5  < > 3.3*  < > 3.5 3.4* 3.8 3.5 3.7  CL 95*  < > 94*  < > 93* 94* 96 96 97  CO2 12*  < > 23  < > 26 26 26 26 27   GLUCOSE 202*  < > 168*  < > 132* 138* 121* 98 134*  BUN 48*  < > 50*  < > 49* 49* 48* 49* 47*  CREATININE 4.99*  < > 3.71*  < > 2.94* 2.86* 2.62* 2.40* 2.17*  CALCIUM 5.5*  < > 6.3*  < > 7.0* 7.1* 7.6* 7.5* 7.8*  MG 0.8*  --   --   --  1.0*  --   --  1.8  --   PHOS  --   --  3.2  --  3.8  --   --  3.5  --   < > = values in this interval not displayed.   CBC   Recent Labs Lab 03/14/13 0400 03/15/13 0500 03/16/13 0330  HGB 8.1* 8.7* 8.0*  HCT 22.1* 24.2* 23.2*  WBC 10.3 12.9* 11.3*  PLT 127* 126*  142*   Anti-Coagulation  Recent Labs Lab 03/09/13 1759 03/09/13 2215  INR 1.20 1.37      Follow-up Information   Follow up with [facility], MD In 2 weeks. (post d/c from [facility] )    Contact information:   [address] [place] [address] [phone]          Medication List    STOP taking these medications       aspirin EC 81 MG tablet     atorvastatin 40 MG tablet  Commonly known as:  LIPITOR     calcium carbonate 500 MG chewable tablet  Commonly known as:  TUMS - dosed in mg elemental calcium     DSS 100 MG Caps     gabapentin 300 MG capsule  Commonly known as:  NEURONTIN     gemfibrozil 600 MG tablet  Commonly known as:  LOPID     metFORMIN 500 MG tablet  Commonly known as:  GLUCOPHAGE     niacin 750 MG CR tablet  Commonly known as:  NIASPAN     nitroGLYCERIN 0.4 MG SL tablet  Commonly known as:  NITROSTAT      TAKE these medications       albuterol 108 (90 BASE) MCG/ACT inhaler  Commonly known as:  PROVENTIL HFA;VENTOLIN HFA  Inhale 4 puffs into the lungs every 2 (two) hours as needed for wheezing.     antiseptic oral rinse Liqd  15 mLs by Mouth Rinse route QID.     aspirin 81 MG chewable tablet  Place 1 tablet (81 mg total) into feeding tube daily.     benztropine 1 MG tablet  Commonly known as:  COGENTIN  Take 1 mg by mouth at bedtime.     chlorhexidine 0.12 % solution  Commonly known as:  PERIDEX  Use as directed 15 mLs in the mouth or throat 2 (two) times daily.     ciprofloxacin 400 MG/200ML Soln  Commonly known as:  CIPRO  Inject 200  mLs (400 mg total) into the vein daily.     collagenase ointment  Commonly known as:  SANTYL  Apply 1 application topically daily. To sacrum     famotidine 40 MG/5ML suspension  Commonly known as:  PEPCID  Place 2.5 mLs (20 mg total) into feeding tube at bedtime.     feeding supplement (JEVITY 1.2 CAL) Liqd  20ml/hr via tube     feeding supplement Liqd  Place 30 mLs  into feeding tube 3 (three) times daily.     fluPHENAZine decanoate 25 MG/ML injection  Commonly known as:  PROLIXIN  Inject 1 mL (25 mg total) into the muscle every 14 (fourteen) days.     heparin 5000 UNIT/ML injection  Inject 1 mL (5,000 Units total) into the skin every 8 (eight) hours.     insulin aspart 100 UNIT/ML injection  Commonly known as:  novoLOG  Inject 0-15 Units into the skin every 4 (four) hours.     metroNIDAZOLE 50 mg/ml oral suspension  Commonly known as:  FLAGYL  Place 10 mLs (500 mg total) into feeding tube 3 (three) times daily.     oxybutynin 5 MG tablet  Commonly known as:  DITROPAN  Take 5 mg by mouth 2 (two) times daily.     vancomycin 50 mg/mL oral solution  Commonly known as:  VANCOCIN  Take 10 mLs (500 mg total) by mouth every 6 (six) hours.         Disposition: LTAC - [facility]   Discharged Condition: Krista [name] has met maximum benefit of inpatient care and is medically stable and cleared for discharge.  Patient is pending follow up as above.      Time spent on disposition:  Greater than 35 minutes.   SignedDanford Bad, NP 03/16/2013  2:08 PM Pager: [phone] or [phone]  *Care during the described time interval was provided by me and/or other providers on the critical care team. I have reviewed this patient's available data, including medical history, events of note, physical examination and test results as part of my evaluation.  ALVA,RAKESH V.

## 2013-03-16 NOTE — Progress Notes (Signed)
Physical Therapy Treatment Patient Details Name: Krista [name] MRN: [id number] DOB: 02-10-1956 Today's Date: 03/16/2013 Time: 4098-1191 PT Time Calculation (min): 18 min  PT Assessment / Plan / Recommendation Comments on Treatment Session  Pt s/p sepsis with schizophrenia.  Limited progression of mobility secondary to poor ability for pt to participate with PT.  Pt did not assist in mobility at all today.  Tot assist for all mobility.     Follow Up Recommendations  SNF;Supervision/Assistance - 24 hour                 Equipment Recommendations  None recommended by PT       Frequency Min 3X/week   Plan Discharge plan remains appropriate;Frequency remains appropriate    Precautions / Restrictions Precautions Precautions: Fall Restrictions Weight Bearing Restrictions: No   Pertinent Vitals/Pain VSS, No pain    Mobility  Bed Mobility Bed Mobility: Supine to Sit;Sitting - Scoot to [place] of Bed;Scooting to [facility] Rolling Right: 1: +2 Total assist Rolling Right: Patient Percentage: 10% Right Sidelying to Sit: 1: +2 Total assist Right Sidelying to Sit: Patient Percentage: 10% Supine to Sit: 1: +2 Total assist Supine to Sit: Patient Percentage: 10% Sitting - Scoot to Edge of Bed: 1: +1 Total assist Scooting to HOB: 1: +2 Total assist Scooting to [facility]: Patient Percentage: 0% Details for Bed Mobility Assistance: Pt not very responsive at this time.  Tried to get pt to kick her LEs and move her UEs.  PT would not.  Pt appeared to attempt trunk extension x2 but very little movement and not effective. Cannot even hold her head up - maintains flexed neck. Transfers Transfers: Not assessed Ambulation/Gait Ambulation/Gait Assistance: Not tested (comment) General Gait Details: pt with kyphotic posture & keeps UE's close to side with elbows bent.   Stairs: No Wheelchair Mobility Wheelchair Mobility: No    Exercises General Exercises - Lower Extremity Heel Slides: PROM;Both;10  reps;Supine Hip ABduction/ADduction: PROM;Both;10 reps;Supine   PT Goals Acute Rehab PT Goals PT Goal: Supine/Side to Sit - Progress: Progressing toward goal PT Goal: Sit to Supine/Side - Progress: Progressing toward goal  Visit Information  Last PT Received On: 03/16/13 Assistance Needed: +2    Subjective Data  Subjective: No verbalizations.  Blank stare.  Did mouth "good bye" at end of treatment.   Cognition  Cognition Arousal/Alertness: Awake/alert Behavior During Therapy: Flat affect Overall Cognitive Status: History of cognitive impairments - at baseline    Balance  Static Sitting Balance Static Sitting - Balance Support: Feet supported;No upper extremity supported Static Sitting - Level of Assistance: 1: +2 Total assist;Other (comment) (pt = 0%) Static Sitting - Comment/# of Minutes: Continues to lack postural stability.   Static Standing Balance Static Standing - Level of Assistance: Not tested (comment)  End of Session PT - End of Session Equipment Utilized During Treatment: Gait belt;Oxygen Activity Tolerance: Other (comment) (Continues to be limited by flat affect, poor interaction) Patient left: in bed;with call bell/phone within reach Nurse Communication: Mobility status;Need for lift equipment       INGOLD,Elan Brainerd 03/16/2013, 1:26 PM [facility] Acute Rehabilitation [phone] (pager)

## 2013-03-17 ENCOUNTER — Other Ambulatory Visit (HOSPITAL_COMMUNITY): Payer: Self-pay

## 2013-03-17 LAB — COMPREHENSIVE METABOLIC PANEL
Albumin: 1.7 g/dL — ABNORMAL LOW (ref 3.5–5.2)
Alkaline Phosphatase: 634 U/L — ABNORMAL HIGH (ref 39–117)
BUN: 47 mg/dL — ABNORMAL HIGH (ref 6–23)
Calcium: 8 mg/dL — ABNORMAL LOW (ref 8.4–10.5)
GFR calc Af Amer: 40 mL/min — ABNORMAL LOW (ref >90)
Glucose, Bld: 181 mg/dL — ABNORMAL HIGH (ref 70–99)
Potassium: 3.1 mEq/L — ABNORMAL LOW (ref 3.5–5.1)
Total Protein: 3.9 g/dL — ABNORMAL LOW (ref 6.0–8.3)

## 2013-03-17 LAB — CBC WITH DIFFERENTIAL/PLATELET
Basophils Absolute: 0 10*3/uL (ref 0.0–0.1)
Eosinophils Absolute: 0.1 10*3/uL (ref 0.0–0.7)
Lymphocytes Relative: 16 % (ref 12–46)
Lymphs Abs: 1.5 10*3/uL (ref 0.7–4.0)
MCHC: 34.4 g/dL (ref 30.0–36.0)
MCV: 86 fL (ref 78.0–100.0)
Monocytes Relative: 11 % (ref 3–12)
Platelets: 168 10*3/uL (ref 150–400)
RDW: 15.2 % (ref 11.5–15.5)
WBC: 9.2 10*3/uL (ref 4.0–10.5)

## 2013-03-17 LAB — PREALBUMIN: Prealbumin: 19.1 mg/dL (ref 17.0–34.0)

## 2013-03-17 LAB — MAGNESIUM: Magnesium: 1.7 mg/dL (ref 1.5–2.5)

## 2013-03-17 LAB — SEDIMENTATION RATE: Sed Rate: 60 mm/hr — ABNORMAL HIGH (ref 0–22)

## 2013-03-17 LAB — HEMOGLOBIN AND HEMATOCRIT, BLOOD: HCT: 21.8 % — ABNORMAL LOW (ref 36.0–46.0)

## 2013-03-18 LAB — BASIC METABOLIC PANEL
CO2: 26 mEq/L (ref 19–32)
Calcium: 7.9 mg/dL — ABNORMAL LOW (ref 8.4–10.5)
GFR calc Af Amer: 60 mL/min — ABNORMAL LOW (ref >90)
GFR calc non Af Amer: 52 mL/min — ABNORMAL LOW (ref >90)
Sodium: 137 mEq/L (ref 135–145)

## 2013-03-18 LAB — CBC WITH DIFFERENTIAL/PLATELET
Eosinophils Absolute: 0 10*3/uL (ref 0.0–0.7)
Eosinophils Relative: 0 % (ref 0–5)
MCH: 30.6 pg (ref 26.0–34.0)
Monocytes Absolute: 1.3 10*3/uL — ABNORMAL HIGH (ref 0.1–1.0)
Neutrophils Relative %: 72 % (ref 43–77)
Platelets: 219 10*3/uL (ref 150–400)
RBC: 2.71 MIL/uL — ABNORMAL LOW (ref 3.87–5.11)

## 2013-03-18 LAB — AMMONIA: Ammonia: 39 umol/L (ref 11–60)

## 2013-03-18 LAB — VITAMIN B12: Vitamin B-12: 950 pg/mL — ABNORMAL HIGH (ref 211–911)

## 2013-03-18 LAB — CK: Total CK: 385 U/L — ABNORMAL HIGH (ref 7–177)

## 2013-03-19 LAB — CBC
HCT: 30.8 % — ABNORMAL LOW (ref 36.0–46.0)
MCV: 90.1 fL (ref 78.0–100.0)
Platelets: 219 10*3/uL (ref 150–400)
RBC: 3.42 MIL/uL — ABNORMAL LOW (ref 3.87–5.11)
WBC: 10.7 10*3/uL — ABNORMAL HIGH (ref 4.0–10.5)

## 2013-03-19 LAB — BASIC METABOLIC PANEL
CO2: 28 mEq/L (ref 19–32)
Chloride: 102 mEq/L (ref 96–112)
Potassium: 3.3 mEq/L — ABNORMAL LOW (ref 3.5–5.1)
Sodium: 140 mEq/L (ref 135–145)

## 2013-03-20 LAB — BASIC METABOLIC PANEL
BUN: 37 mg/dL — ABNORMAL HIGH (ref 6–23)
CO2: 31 mEq/L (ref 19–32)
Chloride: 102 mEq/L (ref 96–112)
Creatinine, Ser: 0.72 mg/dL (ref 0.50–1.10)
Potassium: 2.9 mEq/L — ABNORMAL LOW (ref 3.5–5.1)

## 2013-03-20 LAB — HEMOGLOBIN A1C: Hgb A1c MFr Bld: 5.8 % — ABNORMAL HIGH (ref <5.7)

## 2013-03-21 LAB — PROTIME-INR: INR: 1.04 (ref 0.00–1.49)

## 2013-03-21 LAB — URINE MICROSCOPIC-ADD ON

## 2013-03-21 LAB — CBC WITH DIFFERENTIAL/PLATELET
Basophils Absolute: 0 10*3/uL (ref 0.0–0.1)
Basophils Relative: 0 % (ref 0–1)
Eosinophils Absolute: 0 10*3/uL (ref 0.0–0.7)
Eosinophils Relative: 0 % (ref 0–5)
Lymphocytes Relative: 14 % (ref 12–46)
MCH: 30.7 pg (ref 26.0–34.0)
MCHC: 32.6 g/dL (ref 30.0–36.0)
MCV: 94.3 fL (ref 78.0–100.0)
Monocytes Absolute: 0.8 10*3/uL (ref 0.1–1.0)
Platelets: 277 10*3/uL (ref 150–400)
RDW: 18.7 % — ABNORMAL HIGH (ref 11.5–15.5)
WBC: 11.5 10*3/uL — ABNORMAL HIGH (ref 4.0–10.5)

## 2013-03-21 LAB — TYPE AND SCREEN: Unit division: 0

## 2013-03-21 LAB — URINALYSIS, ROUTINE W REFLEX MICROSCOPIC
Bilirubin Urine: NEGATIVE
Glucose, UA: NEGATIVE mg/dL
Hgb urine dipstick: NEGATIVE
Nitrite: NEGATIVE
Specific Gravity, Urine: 1.018 (ref 1.005–1.030)
pH: 6.5 (ref 5.0–8.0)

## 2013-03-21 LAB — MAGNESIUM: Magnesium: 1 mg/dL — ABNORMAL LOW (ref 1.5–2.5)

## 2013-03-21 LAB — COMPREHENSIVE METABOLIC PANEL
ALT: 19 U/L (ref 0–35)
Albumin: 2.2 g/dL — ABNORMAL LOW (ref 3.5–5.2)
Alkaline Phosphatase: 250 U/L — ABNORMAL HIGH (ref 39–117)
BUN: 32 mg/dL — ABNORMAL HIGH (ref 6–23)
Chloride: 104 mEq/L (ref 96–112)
Potassium: 3.5 mEq/L (ref 3.5–5.1)
Sodium: 146 mEq/L — ABNORMAL HIGH (ref 135–145)
Total Bilirubin: 0.5 mg/dL (ref 0.3–1.2)

## 2013-03-21 LAB — HEMOGLOBIN AND HEMATOCRIT, BLOOD: Hemoglobin: 7.1 g/dL — ABNORMAL LOW (ref 12.0–15.0)

## 2013-03-22 LAB — CBC
HCT: 32.1 % — ABNORMAL LOW (ref 36.0–46.0)
MCV: 92.2 fL (ref 78.0–100.0)
RBC: 3.48 MIL/uL — ABNORMAL LOW (ref 3.87–5.11)
WBC: 11.3 10*3/uL — ABNORMAL HIGH (ref 4.0–10.5)

## 2013-03-22 LAB — URINE CULTURE
Colony Count: 8000
Special Requests: NORMAL

## 2013-03-22 LAB — RENAL FUNCTION PANEL
BUN: 31 mg/dL — ABNORMAL HIGH (ref 6–23)
Chloride: 102 mEq/L (ref 96–112)
Glucose, Bld: 216 mg/dL — ABNORMAL HIGH (ref 70–99)
Potassium: 3.2 mEq/L — ABNORMAL LOW (ref 3.5–5.1)

## 2013-03-23 ENCOUNTER — Other Ambulatory Visit (HOSPITAL_COMMUNITY): Payer: Self-pay

## 2013-03-23 LAB — URINALYSIS, ROUTINE W REFLEX MICROSCOPIC
Bilirubin Urine: NEGATIVE
Nitrite: NEGATIVE
Specific Gravity, Urine: 1.015 (ref 1.005–1.030)
pH: 6.5 (ref 5.0–8.0)

## 2013-03-23 LAB — TYPE AND SCREEN

## 2013-03-23 LAB — RENAL FUNCTION PANEL
Albumin: 2 g/dL — ABNORMAL LOW (ref 3.5–5.2)
BUN: 31 mg/dL — ABNORMAL HIGH (ref 6–23)
Chloride: 103 mEq/L (ref 96–112)
Glucose, Bld: 206 mg/dL — ABNORMAL HIGH (ref 70–99)
Potassium: 5.1 mEq/L (ref 3.5–5.1)

## 2013-03-23 LAB — CBC WITH DIFFERENTIAL/PLATELET
Eosinophils Relative: 0 % (ref 0–5)
HCT: 34 % — ABNORMAL LOW (ref 36.0–46.0)
Lymphocytes Relative: 8 % — ABNORMAL LOW (ref 12–46)
Lymphs Abs: 1.3 10*3/uL (ref 0.7–4.0)
MCV: 95 fL (ref 78.0–100.0)
Neutro Abs: 15.2 10*3/uL — ABNORMAL HIGH (ref 1.7–7.7)
Platelets: 288 10*3/uL (ref 150–400)
RBC: 3.58 MIL/uL — ABNORMAL LOW (ref 3.87–5.11)
WBC: 17.4 10*3/uL — ABNORMAL HIGH (ref 4.0–10.5)

## 2013-03-23 LAB — URINE MICROSCOPIC-ADD ON

## 2013-03-23 LAB — MAGNESIUM: Magnesium: 1.7 mg/dL (ref 1.5–2.5)

## 2013-03-24 LAB — BASIC METABOLIC PANEL
CO2: 37 mEq/L — ABNORMAL HIGH (ref 19–32)
Chloride: 104 mEq/L (ref 96–112)
Sodium: 146 mEq/L — ABNORMAL HIGH (ref 135–145)

## 2013-03-24 LAB — CBC WITH DIFFERENTIAL/PLATELET
Basophils Absolute: 0 10*3/uL (ref 0.0–0.1)
Lymphocytes Relative: 14 % (ref 12–46)
Neutro Abs: 10.2 10*3/uL — ABNORMAL HIGH (ref 1.7–7.7)
Platelets: 254 10*3/uL (ref 150–400)
RDW: 20.5 % — ABNORMAL HIGH (ref 11.5–15.5)
WBC: 12.7 10*3/uL — ABNORMAL HIGH (ref 4.0–10.5)

## 2013-03-24 LAB — MAGNESIUM: Magnesium: 1.4 mg/dL — ABNORMAL LOW (ref 1.5–2.5)

## 2013-03-25 ENCOUNTER — Other Ambulatory Visit (HOSPITAL_COMMUNITY): Payer: Self-pay

## 2013-03-25 ENCOUNTER — Encounter (HOSPITAL_COMMUNITY): Payer: Self-pay

## 2013-03-25 DIAGNOSIS — J9600 Acute respiratory failure, unspecified whether with hypoxia or hypercapnia: Secondary | ICD-10-CM

## 2013-03-25 DIAGNOSIS — J189 Pneumonia, unspecified organism: Secondary | ICD-10-CM

## 2013-03-25 HISTORY — DX: Pneumonia, unspecified organism: J18.9

## 2013-03-25 HISTORY — DX: Acute respiratory failure, unspecified whether with hypoxia or hypercapnia: J96.00

## 2013-03-25 HISTORY — PX: TRACHEOSTOMY: SUR1362

## 2013-03-25 LAB — CBC WITH DIFFERENTIAL/PLATELET
Basophils Absolute: 0 10*3/uL (ref 0.0–0.1)
Basophils Relative: 0 % (ref 0–1)
Eosinophils Absolute: 0 10*3/uL (ref 0.0–0.7)
Eosinophils Relative: 0 % (ref 0–5)
Eosinophils Relative: 0 % (ref 0–5)
Lymphocytes Relative: 15 % (ref 12–46)
Lymphs Abs: 1.9 10*3/uL (ref 0.7–4.0)
MCH: 32.3 pg (ref 26.0–34.0)
MCHC: 31.7 g/dL (ref 30.0–36.0)
MCHC: 32.3 g/dL (ref 30.0–36.0)
MCV: 100.9 fL — ABNORMAL HIGH (ref 78.0–100.0)
MCV: 100 fL (ref 78.0–100.0)
Monocytes Absolute: 1 10*3/uL (ref 0.1–1.0)
Platelets: 242 10*3/uL (ref 150–400)
Platelets: 262 10*3/uL (ref 150–400)
RBC: 3.5 MIL/uL — ABNORMAL LOW (ref 3.87–5.11)
RDW: 20.6 % — ABNORMAL HIGH (ref 11.5–15.5)
WBC: 11 10*3/uL — ABNORMAL HIGH (ref 4.0–10.5)

## 2013-03-25 LAB — BASIC METABOLIC PANEL
BUN: 36 mg/dL — ABNORMAL HIGH (ref 6–23)
Chloride: 104 mEq/L (ref 96–112)
Creatinine, Ser: 0.66 mg/dL (ref 0.50–1.10)
GFR calc Af Amer: >90 mL/min (ref >90)
GFR calc non Af Amer: >90 mL/min (ref >90)

## 2013-03-25 LAB — BLOOD GAS, ARTERIAL
Acid-Base Excess: 8.3 mmol/L — ABNORMAL HIGH (ref 0.0–2.0)
Bicarbonate: 33.7 mEq/L — ABNORMAL HIGH (ref 20.0–24.0)
Bicarbonate: 31.9 mEq/L — ABNORMAL HIGH (ref 20.0–24.0)
FIO2: 1 %
PEEP: 5 cmH2O
TCO2: 35.5 mmol/L (ref 0–100)
TCO2: 33.5 mmol/L (ref 0–100)
pCO2 arterial: 60.2 mmHg (ref 35.0–45.0)
pCO2 arterial: 53.8 mmHg — ABNORMAL HIGH (ref 35.0–45.0)
pH, Arterial: 7.366 (ref 7.350–7.450)
pH, Arterial: 7.39 (ref 7.350–7.450)
pO2, Arterial: 90.8 mmHg (ref 80.0–100.0)

## 2013-03-25 NOTE — Procedures (Signed)
Bronchoscopy Procedure Note Krista [name] [id number] [birth year]  Procedure: Bronchoscopy Indications: Diagnostic evaluation of the airways, Obtain specimens for culture and/or other diagnostic studies and Remove secretions  Procedure Details Consent: Unable to obtain consent because of emergent medical necessity. Time Out: Verified patient identification, verified procedure, site/side was marked, verified correct patient position, special equipment/implants available, medications/allergies/relevent history reviewed, required imaging and test results available.  Performed  In preparation for procedure, patient was given 100% FiO2 and bronchoscope lubricated. Sedation: Etomidate  Airway entered and the following bronchi were examined: RUL, RML, RLL, LUL, LLL and Bronchi.   Procedures performed: Brushings performed Bronchoscope removed.    Evaluation Hemodynamic Status: BP stable throughout; O2 sats: stable throughout Patient's Current Condition: stable Specimens:  Sent purulent fluid Complications: No apparent complications Patient did tolerate procedure well.   Krista [name]. 03/25/2013   Post intubation , poor columes, difficult to bag 1. Full obstruction thin pus white all lobes, mainstem, severe, all suctioned clear 2.BAL RML  Krista [name]. Tyson Alias, MD, FACP Pgr: [phone] [place] [facility]

## 2013-03-25 NOTE — Procedures (Signed)
Perc trach  Consented patient and family , daughter Pre op [id number] PNA and resp failure Post op: tracheostomy to pNA Pt in supine position  Chlorhexidine to operative site. Bronch placed, ett back to 19 cm  Lido plus epi 7 cc injected over 2 intertracheal space  1 cm vert incision made. disesstion to straps. Placed 18 gauge needle with white cath sheeth.  Placed wire, punch dilator , progresive dil to 30 fr then trach 6 shiley. Sutured in place trach  Blood loss about 5 cc  BP was on low dose pressors, levophed , much improved after propofol off pre op  pcxr pending Mcarthur Rossetti. Tyson Alias, MD, FACP  Pgr: [phone]  [place] [facility]

## 2013-03-25 NOTE — Progress Notes (Signed)
PULMONARY  / CRITICAL CARE MEDICINE  Name: Krista [name] MRN: [id number] DOB: [birth year]    ADMISSION DATE:  03/16/13 select CONSULTATION DATE:  03/25/13  REFERRING MD :  March Rummage PRIMARY SERVICE: Select MD  CHIEF COMPLAINT:  Septic shock   BRIEF PATIENT DESCRIPTION: 57 y/o female with recent c.diff and schizophrenia was brought in to [facility] ED on 5/7 by EMS after her family said she had been unresponsive for 24 hours.  She was in septic shock, AKI, and had diarrhea and a UTI. To select , declines PNa, collapse ette.  SIGNIFICANT EVENTS / STUDIES:  5/7 CT head >>neg acute 5/7 CT neck >>neg acute 5/8 RUQ U/S >>chronic medical renal disease, Trace bilateral perinephric fluid 5/8 CT abdo/pelvis>>>Increased abdominal ascites and mesenteric edema. Inflammatory changes surround the pancreas. Question pancreatitis. New bilateral pleural effusions, right greater than left, with near total collapse of the right lower lobe. Multiple thoracolumbar compression fractures are stable. 5/8 Ct >>>neg edema, acute 5/14 to select 5/23- emergent intubation, pus all lobes and collapse, emergent bronch  LINES / TUBES: 5/7 ETT (EDP) >>5/12 5/7 L [place] CVL (EDP) >>5/8  5/8 rt IJ>>> 5/23 ETT>>>  CULTURES: 5/7 blood >>>enterobacter S to cipro 5/7 urine >>enterobacter S to cipro 5/7 c.diff >>POS  5/23 Bronch BAL>>> 5/22 BC>>>  ANTIBIOTICS: 5/23 vanc>>> 5/23 Meropenem>>> 5/23 duflucan>>>  SUBJECTIVE:  Distress, ETT , collapse all lobes   HPI , see above No current facility-administered medications on file prior to encounter.   Current Outpatient Prescriptions on File Prior to Encounter  Medication Sig Dispense Refill  . albuterol (PROVENTIL HFA;VENTOLIN HFA) 108 (90 BASE) MCG/ACT inhaler Inhale 4 puffs into the lungs every 2 (two) hours as needed for wheezing.      Marland Kitchen antiseptic oral rinse (BIOTENE) LIQD 15 mLs by Mouth Rinse route QID.      Marland Kitchen aspirin 81 MG chewable tablet Place 1 tablet (81 mg  total) into feeding tube daily.      . benztropine (COGENTIN) 1 MG tablet Take 1 mg by mouth at bedtime.      . chlorhexidine (PERIDEX) 0.12 % solution Use as directed 15 mLs in the mouth or throat 2 (two) times daily.  120 mL  0  . ciprofloxacin (CIPRO) 400 MG/200ML SOLN Inject 200 mLs (400 mg total) into the vein daily.  2000 mL    . collagenase (SANTYL) ointment Apply 1 application topically daily. To sacrum  15 g  0  . famotidine (PEPCID) 40 MG/5ML suspension Place 2.5 mLs (20 mg total) into feeding tube at bedtime.  50 mL  0  . feeding supplement (PRO-STAT SUGAR FREE 64) LIQD Place 30 mLs into feeding tube 3 (three) times daily.  900 mL  0  . fluPHENAZine decanoate (PROLIXIN) 25 MG/ML injection Inject 1 mL (25 mg total) into the muscle every 14 (fourteen) days.  5 mL    . heparin 5000 UNIT/ML injection Inject 1 mL (5,000 Units total) into the skin every 8 (eight) hours.  1 mL    . insulin aspart (NOVOLOG) 100 UNIT/ML injection Inject 0-15 Units into the skin every 4 (four) hours.  1 vial  12  . metroNIDAZOLE (FLAGYL) 50 mg/ml oral suspension Place 10 mLs (500 mg total) into feeding tube 3 (three) times daily.      . Nutritional Supplements (FEEDING SUPPLEMENT, JEVITY 1.2 CAL,) LIQD 34ml/hr via tube    0  . oxybutynin (DITROPAN) 5 MG tablet Take 5 mg by mouth 2 (two) times daily.       Marland Kitchen  vancomycin (VANCOCIN) 50 mg/mL oral solution Take 10 mLs (500 mg total) by mouth every 6 (six) hours.       Past Medical History  Diagnosis Date  . Arthritis   . Hyperlipidemia   . Hypertension   . Allergy   . GERD (gastroesophageal reflux disease)   . Back pain   . Type II diabetes mellitus   . Schizophrenia     /notes 02/17/2013   Past Surgical History  Procedure Laterality Date  . Tubal ligation     History   Social History  . Marital Status: Legally Separated    Spouse Name: N/A    Number of Children: N/A  . Years of Education: N/A   Occupational History  . Not on file.   Social  History Main Topics  . Smoking status: Current Every Day Smoker -- 1.00 packs/day    Types: Cigarettes  . Smokeless tobacco: Never Used  . Alcohol Use: No  . Drug Use: Not on file  . Sexually Active: Not on file   Other Topics Concern  . Not on file   Social History Narrative  . No narrative on file   Family History  Problem Relation Age of Onset  . Diabetes Mother   . COPD Mother   . Heart disease Mother   . Hyperlipidemia Mother   . Hypertension Mother   . Arthritis Mother    Ros unable ,poor ms,ett now   VITAL SIGNS:  127 117;/60 50 rr sats 88% on 100%   HEMODYNAMICS:   VENTILATOR SETTINGS:   INTAKE / OUTPUT: Intake/Output   None     PHYSICAL EXAMINATION:  Gen: chronically ill, severe distress HEENT: no jvd, obese PULM: Rhonchi bilaterally severe, lour sounds in upper airway CV: s1 s2 rrr AB: BS increased, nontender, no hsm Ext: warm, increased edema 2 plus Neuro:  Confused, nonfocal  LABS:  Recent Labs Lab 03/21/13 0500 03/21/13 1006 03/21/13 1200 03/22/13 0830 03/23/13 0656 03/23/13 0845 03/24/13 0340 03/25/13 0420 03/25/13 0915  HGB 7.0* 7.1* 7.2* 10.8* 11.2*  --  10.6*  --  10.3*  WBC 11.5*  --   --  11.3* 17.4*  --  12.7*  --  11.0*  PLT 277  --   --  295 288  --  254  --  242  NA 146*  --   --  144 144  --  146* 144  --   K 3.5  --   --  3.2* 5.1  --  3.7 3.3*  --   CL 104  --   --  102 103  --  104 104  --   CO2 33*  --   --  32 30  --  37* 37*  --   GLUCOSE 183*  --   --  216* 206*  --  159* 180*  --   BUN 32*  --   --  31* 31*  --  33* 36*  --   CREATININE 0.63  --   --  0.60 0.61  --  0.64 0.66  --   CALCIUM 7.5*  --   --  7.7* 8.1*  --  7.9* 7.6*  --   MG 1.0*  --   --  1.4* 1.7  --  1.4*  --   --   PHOS  --   --   --  3.4 4.4  --   --   --   --   AST 48*  --   --   --   --   --   --   --   --  ALT 19  --   --   --   --   --   --   --   --   ALKPHOS 250*  --   --   --   --   --   --   --   --   BILITOT 0.5  --   --   --   --    --   --   --   --   PROT 4.5*  --   --   --   --   --   --   --   --   ALBUMIN 2.2*  --   --  2.1* 2.0*  --   --   --   --   INR  --  1.11 1.04  --   --   --   --   --   --   LATICACIDVEN  --   --   --   --   --  2.0  --   --   --    No results found for this basename: GLUCAP,  in the last 168 hours  CXR: 5/12 bibasilar effusions  ASSESSMENT / PLAN:  PULMONARY A: Acute respiratory failure due to increased work of breathing from sepsis and inability to protect airway from encephalopathy 5/23 HCAP, severe P:   Emergent intubation, emergent bronch, see notes Keep peep 10 for now, increase MV 20 abg STAT pcxr Consider early trach Recruit Obstriction treated by bronch, may need chest pt  CARDIOVASCULAR A: Septic shock - resolved NSTEMI> demand ischaemia Echo - nml LV fn Post intubation hypotension 5/23 P:  -volume, may need neo Tele Treat resp failure -eval for rel AI if Bp not improved  INFECTIOUS A:  PRIOR Gram neg Septic shock due to UTI and c.diff and cellulitis/skin breakdown on buttocks NEW CHAP 5/23 P:   -follow bronch bal -STAT vanc, imi -ensure BC sent   Updated daughter on phone   Code status: full in select  TODAY'S SUMMARY:  Retubed, bronch, collapse imroved, consider trach, aggressive HCAP coverage  Ccm time 40 min   Candance Bohlman J.,MD Pulmonary and Critical Care Medicine [facility] Pager: [phone]  03/25/2013, 10:17 AM

## 2013-03-25 NOTE — Procedures (Signed)
Bronchoscopy  for Percutaneous  Tracheostomy  Name: Krista [name] MRN: [id number] DOB: 07-Jan-1956 Procedure: Bronchoscopy for Percutaneous Tracheostomy Indications: Diagnostic evaluation of the airways In conjunction with: Dr. Tyson Alias   Procedure Details Consent: Risks of procedure as well as the alternatives and risks of each were explained to the (patient/caregiver).  Consent for procedure obtained. Time Out: Verified patient identification, verified procedure, site/side was marked, verified correct patient position, special equipment/implants available, medications/allergies/relevent history reviewed, required imaging and test results available.  Performed  In preparation for procedure, patient was given 100% FiO2 and bronchoscope lubricated. Sedation: Benzodiazepines and Short-acting barbiturates  Airway entered and the following bronchi were examined: LLL and Bronchi.   Procedures performed: Endotracheal Tube retracted in 2 cm increments. Cannulation of airway observed. Dilation observed. Placement of trachel tube  observed . No overt complications. Bronchoscope removed.  , Patient placed back on 100% FiO2 at conclusion of procedure.    Evaluation Hemodynamic Status: Transient hypotension treated with pressors; O2 sats: stable throughout Patient's Current Condition: stable Specimens:  None Complications: No apparent complications Patient did tolerate procedure well.  Left lingula suctioned for copious purulent sputum.   Brett Canales Minor ACNP [facility] PCC[facility] Pager [phone] till 3 pm If no answer page [phone] 03/25/2013, 1:38 PM  Patient seen and examined, agree with above note.  I dictated the care and orders written for this patient under my direction.  Alyson Reedy, MD [phone]

## 2013-03-25 NOTE — Procedures (Signed)
Intubation Procedure Note Krista [name] [id number] Jan 09, 1956  Procedure: Intubation Indications: Respiratory insufficiency  Procedure Details Consent: Unable to obtain consent because of emergent medical necessity. Time Out: Verified patient identification, verified procedure, site/side was marked, verified correct patient position, special equipment/implants available, medications/allergies/relevent history reviewed, required imaging and test results available.  Performed  Maximum sterile technique was used including antiseptics, cap, gloves, gown, hand hygiene and mask.  MAC and 3    Evaluation Hemodynamic Status: BP stable throughout; O2 sats: stable throughout Patient's Current Condition: stable Complications: No apparent complications Patient did tolerate procedure well. Chest X-ray ordered to verify placement.  CXR: pending.   Krista [name] 03/25/2013  Glide, pus whiter all over airway, occluding

## 2013-03-26 ENCOUNTER — Other Ambulatory Visit (HOSPITAL_COMMUNITY): Payer: Self-pay

## 2013-03-26 LAB — COMPREHENSIVE METABOLIC PANEL
Albumin: 1.5 g/dL — ABNORMAL LOW (ref 3.5–5.2)
Alkaline Phosphatase: 158 U/L — ABNORMAL HIGH (ref 39–117)
BUN: 38 mg/dL — ABNORMAL HIGH (ref 6–23)
CO2: 27 mEq/L (ref 19–32)
Chloride: 104 mEq/L (ref 96–112)
GFR calc Af Amer: >90 mL/min (ref >90)
Glucose, Bld: 234 mg/dL — ABNORMAL HIGH (ref 70–99)
Potassium: 4.1 mEq/L (ref 3.5–5.1)
Total Bilirubin: 0.4 mg/dL (ref 0.3–1.2)

## 2013-03-26 LAB — CBC WITH DIFFERENTIAL/PLATELET
Basophils Relative: 0 % (ref 0–1)
Eosinophils Absolute: 0 10*3/uL (ref 0.0–0.7)
HCT: 26.6 % — ABNORMAL LOW (ref 36.0–46.0)
Hemoglobin: 8.6 g/dL — ABNORMAL LOW (ref 12.0–15.0)
MCH: 31.9 pg (ref 26.0–34.0)
MCHC: 32.3 g/dL (ref 30.0–36.0)
Monocytes Absolute: 0.5 10*3/uL (ref 0.1–1.0)
Monocytes Relative: 7 % (ref 3–12)

## 2013-03-26 LAB — CULTURE, RESPIRATORY W GRAM STAIN

## 2013-03-26 NOTE — Progress Notes (Signed)
PULMONARY  / CRITICAL CARE MEDICINE  Name: Krista [name] MRN: [id number] DOB: 1956/06/14    ADMISSION DATE:  03/16/13 select CONSULTATION DATE:  03/25/13  REFERRING MD :  March Rummage PRIMARY SERVICE: Select MD  CHIEF COMPLAINT:  Septic shock   BRIEF PATIENT DESCRIPTION: 57 y/o female with recent c.diff and schizophrenia was brought in to [facility] ED on 5/7 by EMS after her family said she had been unresponsive for 24 hours.  She was in septic shock, AKI, and had diarrhea and a UTI. To select , declines PNa, collapse ette.  SIGNIFICANT EVENTS / STUDIES:  5/7 CT head >>neg acute 5/7 CT neck >>neg acute 5/8 RUQ U/S >>chronic medical renal disease, Trace bilateral perinephric fluid 5/8 CT abdo/pelvis>>>Increased abdominal ascites and mesenteric edema. Inflammatory changes surround the pancreas. Question pancreatitis. New bilateral pleural effusions, right greater than left, with near total collapse of the right lower lobe. Multiple thoracolumbar compression fractures are stable. 5/8 Ct >>>neg edema, acute 5/14 to select 5/23- emergent intubation, pus all lobes and collapse, emergent bronch 5-23 trach 5-23 pressor dependent, cvp 4, on diurectics LINES / TUBES: 5/7 ETT (EDP) >>5/12 5/7 L [place] CVL (EDP) >>5/8  5/8 rt IJ>>> 5/23 ETT>>>5-23 5-23 trach(DF)>> CULTURES: 5/7 blood >>>enterobacter S to cipro 5/7 urine >>enterobacter S to cipro 5/7 c.diff >>POS  5/23 Bronch BAL>>> 5/22 BC>>> 5-21uc>>gpc<< ANTIBIOTICS: 5/23 vanc>>>5-23 5-23 zyvox>> 5/23 Meropenem>>> 5/23 duflucan>>> 5-23 flagyl>> SUBJECTIVE:  Weaning pressors, more awake    VITAL SIGNS:  127 117;/60 50 rr sats 88% on 100%   HEMODYNAMICS:   VENTILATOR SETTINGS:   INTAKE / OUTPUT: Intake/Output   None     PHYSICAL EXAMINATION:  Gen: chronically ill, severe distress HEENT: no jvd, obese, trach cdi PULM: Rhonchi bilaterally severe,  CV: s1 s2 rrr AB: BS increased, nontender, no hsm Ext: warm, increased edema 2  plus Neuro:  Confused, , follows commands when stimulated  LABS:  Recent Labs Lab 03/21/13 0500 03/21/13 1006 03/21/13 1200 03/22/13 0830 03/23/13 0656 03/23/13 0845 03/24/13 0340 03/25/13 0420 03/25/13 0915 03/25/13 1100 03/25/13 1425 03/25/13 1749 03/25/13 1754  HGB 7.0* 7.1* 7.2* 10.8* 11.2*  --  10.6*  --  10.3*  --   --  11.3*  --   WBC 11.5*  --   --  11.3* 17.4*  --  12.7*  --  11.0*  --   --  16.9*  --   PLT 277  --   --  295 288  --  254  --  242  --   --  262  --   NA 146*  --   --  144 144  --  146* 144  --   --   --   --   --   K 3.5  --   --  3.2* 5.1  --  3.7 3.3*  --   --   --   --   --   CL 104  --   --  102 103  --  104 104  --   --   --   --   --   CO2 33*  --   --  32 30  --  37* 37*  --   --   --   --   --   GLUCOSE 183*  --   --  216* 206*  --  159* 180*  --   --   --   --   --   BUN  32*  --   --  31* 31*  --  33* 36*  --   --   --   --   --   CREATININE 0.63  --   --  0.60 0.61  --  0.64 0.66  --   --   --   --   --   CALCIUM 7.5*  --   --  7.7* 8.1*  --  7.9* 7.6*  --   --   --   --   --   MG 1.0*  --   --  1.4* 1.7  --  1.4*  --   --   --   --   --   --   PHOS  --   --   --  3.4 4.4  --   --   --   --   --   --   --   --   AST 48*  --   --   --   --   --   --   --   --   --   --   --   --   ALT 19  --   --   --   --   --   --   --   --   --   --   --   --   ALKPHOS 250*  --   --   --   --   --   --   --   --   --   --   --   --   BILITOT 0.5  --   --   --   --   --   --   --   --   --   --   --   --   PROT 4.5*  --   --   --   --   --   --   --   --   --   --   --   --   ALBUMIN 2.2*  --   --  2.1* 2.0*  --   --   --   --   --   --   --   --   INR  --  1.11 1.04  --   --   --   --   --   --   --   --   --   --   LATICACIDVEN  --   --   --   --   --  2.0  --   --   --   --   --   --  2.0  PHART  --   --   --   --   --   --   --   --   --  7.366 7.390  --   --   PCO2ART  --   --   --   --   --   --   --   --   --  60.2* 53.8*  --   --   PO2ART  --   --    --   --   --   --   --   --   --  90.8 181.0*  --   --    No results found for this basename: GLUCAP,  in the last 168 hours  Imaging: Dg Chest Port 1 [id number]  03/25/2013   *RADIOLOGY REPORT*  Clinical Data: PICC placement.  PORTABLE CHEST - 1 VIEW  Comparison: Chest radiograph performed earlier today in 07:53 p.m.  Findings: The patient's left PICC is now seen ending about the cavoatrial junction.  The tracheostomy tube is seen ending 2-3 cm above the carina.  An enteric tube is noted extending below the diaphragm.  There is persistent left basilar airspace opacification, which may reflect atelectasis or possibly pneumonia.  The right lung appears clear.  No pleural effusion or pneumothorax is seen.  The cardiomediastinal silhouette remains normal in size.  No acute osseous abnormalities are identified.  IMPRESSION:  1.  Left PICC seen ending about the cavoatrial junction. 2.  Persistent left basilar airspace opacification may reflect atelectasis or possibly pneumonia.   Original Report Authenticated By: Tonia Ghent, M.D.   [facility] 1 View  03/25/2013   *RADIOLOGY REPORT*  Clinical Data: PICC  adjustment  PORTABLE CHEST - 1 VIEW  Comparison: 1835 hours  Findings: The left PICC has been advanced and the tip is in the azygos vein.  Bibasilar opacities have increased with lower lung volumes.  IMPRESSION: Left PICC advanced with its tip now in the azygos vein.  Increased bibasilar atelectasis versus consolidation.   Original Report Authenticated By: Jolaine Click, M.D.   Dg Chest Port 1 View  03/25/2013   *RADIOLOGY REPORT*  Clinical Data: PICC line readjustment  PORTABLE CHEST - 1 VIEW  Comparison: 1550 hours  Findings: Left PICC has been retracted.  The tip is now in the left innominate vein.  Otherwise stable exam.  IMPRESSION: Left PICC retracted.  The tip is now in the left innominate vein. Otherwise stable.   Original Report Authenticated By: Jolaine Click, M.D.   Dg Chest Portable 1  View  03/25/2013   *RADIOLOGY REPORT*  Clinical Data: Left PICC placement.  PORTABLE CHEST - 1 VIEW  Comparison: 03/25/2013.  Findings: Tracheostomy is midline.  Heart size within normal limits. Right hilum appears prominent, as before.  Right IJ central line tip projects over the SVC.  Left PICC tip projects over the mid to low right atrium.  The lower left mediastinal contour is sharply demarcated, suggesting the presence of pleural air.  Improving airspace opacification in the left hemithorax without complete resolution. There is worsening air space disease in the medial right lower lobe.  IMPRESSION:  1.  Left PICC is low lying, appearing to terminate in the mid to low right atrium.  Retracting approximately 9-10 cm should better position the tip in the low SVC or SVC/RA junction. 2.  Sharply demarcated lower left mediastinal border is suspicious for left pneumothorax. 3. These results will be called to the ordering clinician or representative by the Radiologist Assistant, and communication documented in the PACS Dashboard. Improving airspace consolidation in the left hemithorax without resolution. 4.  Improving airspace consolidation in the left hemithorax without resolution. 5.  Worsening medial right lower lobe air space disease.   Original Report Authenticated By: Leanna Battles, M.D.   Dg Chest Portable 1 View  03/25/2013   *RADIOLOGY REPORT*  Clinical Data: Tracheostomy tube placement.  PORTABLE CHEST - 1 VIEW  Comparison: Earlier the same day  Findings: 1337 hours.  Tracheostomy tube now visualized in situ. The NG tube passes into the stomach although the distal tip position is not included on the film.  Right IJ central line tip overlies the innominate vein confluence.  There is persistent volume loss left hemithorax with some interval improvement  in left lung aeration. Telemetry leads overlie the chest.  IMPRESSION: Volume loss in the left hemithorax with leftward shift of cardiomediastinal  structures.   Original Report Authenticated By: Kennith Center, M.D.   Dg Chest Portable 1 View  03/25/2013   *RADIOLOGY REPORT*  Clinical Data: Post ETT placement  PORTABLE CHEST - 1 VIEW  Comparison: 03/23/2013  Findings: Near complete opacification of the left hemithorax, new/increased.  Left lung is essentially clear.  No pneumothorax.  Endotracheal tube terminates 3 cm above the carina.  Stable right IJ venous catheter and enteric tube.  Cardiomediastinal is obscured.  IMPRESSION: Endotracheal tube terminates 3 cm above the carina.  Near complete opacification of the left hemithorax, new/increased. This may reflect mucous plugging and/or a combination of atelectasis / infection and pleural effusion.   Original Report Authenticated By: Charline Bills, M.D.     ASSESSMENT / PLAN:  PULMONARY A: Acute respiratory failure due to increased work of breathing from sepsis and inability to protect airway from encephalopathy 5/23 HCAP, severe P:   Emergent intubation, emergent bronch, see notes  peep 5 for now,  abg follow pcxr Recruit Obstruction  treated by bronch 5-23,  c x r improved,may need chest pt  CARDIOVASCULAR A: Septic shock - on pressors 5-24 NSTEMI> demand ischaemia Echo - nml LV fn Post intubation hypotension 5/23 Trach 5-23 P:  -volume, goal cvp>8 -pressors as needed -dc diuretics  Tele -eval for rel AI if Bp not improved. Ordered 5-24 note it was >70 on 5-7  INFECTIOUS A:  PRIOR Gram neg Septic shock due to UTI and c.diff and cellulitis/skin breakdown on buttocks NEW CHAP 5/23 P:   -follow bronch bal - vanc, merr, diflucan, flagyl -BC sent   Updated daughter on phone   Code status: full in select  TODAY'S SUMMARY: Vasopressors are weaning. CVP is 4, plan to stop lasix. Push fluid and wean fio2 as tolerated. PCCM will see Sunday.  CC  Shan Levans Beeper  [phone]  Cell  [phone]  If no response or cell goes to voicemail, call beeper  [phone]   03/26/2013, 7:32 AM

## 2013-03-27 ENCOUNTER — Other Ambulatory Visit (HOSPITAL_COMMUNITY): Payer: Self-pay

## 2013-03-27 LAB — BLOOD GAS, ARTERIAL
MECHVT: 450 mL
O2 Saturation: 100 %
PEEP: 5 cmH2O
Patient temperature: 98.6
TCO2: 29.8 mmol/L (ref 0–100)
pH, Arterial: 7.526 — ABNORMAL HIGH (ref 7.350–7.450)

## 2013-03-27 LAB — CULTURE, BLOOD (ROUTINE X 2)

## 2013-03-27 LAB — COMPREHENSIVE METABOLIC PANEL
ALT: 21 U/L (ref 0–35)
AST: 47 U/L — ABNORMAL HIGH (ref 0–37)
Calcium: 7.3 mg/dL — ABNORMAL LOW (ref 8.4–10.5)
GFR calc Af Amer: >90 mL/min (ref >90)
Sodium: 138 mEq/L (ref 135–145)
Total Protein: 4.5 g/dL — ABNORMAL LOW (ref 6.0–8.3)

## 2013-03-27 LAB — URINE CULTURE: Special Requests: NORMAL

## 2013-03-27 LAB — CULTURE, RESPIRATORY W GRAM STAIN

## 2013-03-27 LAB — CBC
MCH: 32.2 pg (ref 26.0–34.0)
MCHC: 32.6 g/dL (ref 30.0–36.0)
Platelets: 179 10*3/uL (ref 150–400)

## 2013-03-27 NOTE — Progress Notes (Signed)
PULMONARY  / CRITICAL CARE MEDICINE  Name: Krista [name] MRN: [id number] DOB: 16-Jul-1956    ADMISSION DATE:  03/16/13 select CONSULTATION DATE:  03/25/13  REFERRING MD :  March Rummage PRIMARY SERVICE: Select MD  CHIEF COMPLAINT:  Septic shock   BRIEF PATIENT DESCRIPTION: 57 y/o female with recent c.diff and schizophrenia was brought in to [facility] ED on 5/7 by EMS after her family said she had been unresponsive for 24 hours.  She was in septic shock, AKI, and had diarrhea and a UTI. To select , declines PNa, collapse ette.  SIGNIFICANT EVENTS / STUDIES:  5/7 CT head >>neg acute 5/7 CT neck >>neg acute 5/8 RUQ U/S >>chronic medical renal disease, Trace bilateral perinephric fluid 5/8 CT abdo/pelvis>>>Increased abdominal ascites and mesenteric edema. Inflammatory changes surround the pancreas. Question pancreatitis. New bilateral pleural effusions, right greater than left, with near total collapse of the right lower lobe. Multiple thoracolumbar compression fractures are stable. 5/8 Ct >>>neg edema, acute 5/14 to select 5/23- emergent intubation, pus all lobes and collapse, emergent bronch 5-23 trach 5-23 pressor dependent, cvp 4, on diurectics  LINES / TUBES: 5/7 ETT (EDP) >>5/12 5/7 L [place] CVL (EDP) >>5/8  5/8 rt IJ>>> 5/23 ETT>>>5-23 5-23 trach(DF)>>  CULTURES: 5/7 blood >>>enterobacter S to cipro 5/7 urine >>enterobacter S to cipro 5/7 c.diff >>POS 5/23 Bronch BAL>>> 5/22 BC>>>NGTD>> 5-21uc>>gpc>>VRE  ANTIBIOTICS: 5/23 vanc>>>5-23 5-23 zyvox(VRE uti)>> 5/23 Meropenem>>> 5/23 duflucan>>> 5-23 flagyl>>  SUBJECTIVE:  Weaning pressors, more awake    VITAL SIGNS:  127 117;/60 50 rr sats 88% on 100%   HEMODYNAMICS:   VENTILATOR SETTINGS:   INTAKE / OUTPUT: Intake/Output   None     PHYSICAL EXAMINATION:  Gen: chronically ill, no distress HEENT: no jvd, obese, trach cdi PULM: Rhonchi bilaterally severe,  CV: s1 s2 rrr AB: BS increased, nontender, no hsm Ext: warm,  increased edema 2 plus Neuro:  Confused, , follows commands when stimulated  LABS:  Recent Labs Lab 03/21/13 0500 03/21/13 1006 03/21/13 1200 03/22/13 0830 03/23/13 0656 03/23/13 0845 03/24/13 0340 03/25/13 0420  03/25/13 1100 03/25/13 1425 03/25/13 1749 03/25/13 1754 03/26/13 1005 03/27/13 0716 03/27/13 1051  HGB 7.0* 7.1* 7.2* 10.8* 11.2*  --  10.6*  --   < >  --   --  11.3*  --  8.6* 9.4*  --   WBC 11.5*  --   --  11.3* 17.4*  --  12.7*  --   < >  --   --  16.9*  --  6.8 7.5  --   PLT 277  --   --  295 288  --  254  --   < >  --   --  262  --  147* 179  --   NA 146*  --   --  144 144  --  146* 144  --   --   --   --   --   --  138  --   K 3.5  --   --  3.2* 5.1  --  3.7 3.3*  --   --   --   --   --   --  2.8*  --   CL 104  --   --  102 103  --  104 104  --   --   --   --   --   --  101  --   CO2 33*  --   --  32 30  --  37* 37*  --   --   --   --   --   --  27  --   GLUCOSE 183*  --   --  216* 206*  --  159* 180*  --   --   --   --   --   --  237*  --   BUN 32*  --   --  31* 31*  --  33* 36*  --   --   --   --   --   --  30*  --   CREATININE 0.63  --   --  0.60 0.61  --  0.64 0.66  --   --   --   --   --   --  0.54  --   CALCIUM 7.5*  --   --  7.7* 8.1*  --  7.9* 7.6*  --   --   --   --   --   --  7.3*  --   MG 1.0*  --   --  1.4* 1.7  --  1.4*  --   --   --   --   --   --   --   --   --   PHOS  --   --   --  3.4 4.4  --   --   --   --   --   --   --   --   --   --   --   AST 48*  --   --   --   --   --   --   --   --   --   --   --   --   --  47*  --   ALT 19  --   --   --   --   --   --   --   --   --   --   --   --   --  21  --   ALKPHOS 250*  --   --   --   --   --   --   --   --   --   --   --   --   --  210*  --   BILITOT 0.5  --   --   --   --   --   --   --   --   --   --   --   --   --  0.5  --   PROT 4.5*  --   --   --   --   --   --   --   --   --   --   --   --   --  4.5*  --   ALBUMIN 2.2*  --   --  2.1* 2.0*  --   --   --   --   --   --   --   --   --  1.6*   --   INR  --  1.11 1.04  --   --   --   --   --   --   --   --   --   --   --   --   --   LATICACIDVEN  --   --   --   --   --  2.0  --   --   --   --   --   --  2.0  --   --   --   PHART  --   --   --   --   --   --   --   --   --  7.366 7.390  --   --   --   --  7.526*  PCO2ART  --   --   --   --   --   --   --   --   --  60.2* 53.8*  --   --   --   --  34.8*  PO2ART  --   --   --   --   --   --   --   --   --  90.8 181.0*  --   --   --   --  113.0*  < > = values in this interval not displayed. No results found for this basename: GLUCAP,  in the last 168 hours  Imaging: Dg Chest Port 1 View  03/27/2013   *RADIOLOGY REPORT*  Clinical Data: Respiratory failure  PORTABLE CHEST - 1 VIEW  Comparison: Yesterday  Findings: NG tube, tracheostomy tube, left arm PICC are stable. Hazy left basilar and mid lung airspace disease is stable. External object now projects over the mediastinum.  Right lung clear.  No pneumothorax.  IMPRESSION: Stable left mid and lower lung zone airspace disease.   Original Report Authenticated By: Jolaine Click, M.D.   Dg Chest Portable 1 View  03/26/2013   *RADIOLOGY REPORT*  Clinical Data: Post tracheostomy  PORTABLE CHEST - 1 VIEW  Comparison: 03/25/2013; 03/23/2013; 04/05/13; 03/14/2013  Findings:  Grossly unchanged cardiac silhouette and mediastinal contours. Stable positioning of support apparatus.  No pneumothorax. Continued improved aeration of the left lung with persistent left perihilar and medial basilar heterogeneous / consolidative opacities.  Trace left-sided effusion is not excluded.  The right hemithorax is unchanged.  Unchanged bones.  IMPRESSION: 1.  Stable positioning of support apparatus.  No pneumothorax. 2.  Improved aeration of the left lower lung with persistent left perihilar and basilar opacities, atelectasis versus infiltrate.   Original Report Authenticated By: Tacey Ruiz, MD   Dg Chest Port 1 View  03/25/2013   *RADIOLOGY REPORT*  Clinical Data: PICC  placement.  PORTABLE CHEST - 1 VIEW  Comparison: Chest radiograph performed earlier today in 07:53 p.m.  Findings: The patient's left PICC is now seen ending about the cavoatrial junction.  The tracheostomy tube is seen ending 2-3 cm above the carina.  An enteric tube is noted extending below the diaphragm.  There is persistent left basilar airspace opacification, which may reflect atelectasis or possibly pneumonia.  The right lung appears clear.  No pleural effusion or pneumothorax is seen.  The cardiomediastinal silhouette remains normal in size.  No acute osseous abnormalities are identified.  IMPRESSION:  1.  Left PICC seen ending about the cavoatrial junction. 2.  Persistent left basilar airspace opacification may reflect atelectasis or possibly pneumonia.   Original Report Authenticated By: Tonia Ghent, M.D.   [facility] 1 View  03/25/2013   *RADIOLOGY REPORT*  Clinical Data: PICC  adjustment  PORTABLE CHEST - 1 VIEW  Comparison: 1835 hours  Findings: The left PICC has been advanced and the tip is in the azygos vein.  Bibasilar opacities have increased with lower lung volumes.  IMPRESSION: Left PICC advanced with its  tip now in the azygos vein.  Increased bibasilar atelectasis versus consolidation.   Original Report Authenticated By: Jolaine Click, M.D.   Dg Chest Port 1 View  03/25/2013   *RADIOLOGY REPORT*  Clinical Data: PICC line readjustment  PORTABLE CHEST - 1 VIEW  Comparison: 1550 hours  Findings: Left PICC has been retracted.  The tip is now in the left innominate vein.  Otherwise stable exam.  IMPRESSION: Left PICC retracted.  The tip is now in the left innominate vein. Otherwise stable.   Original Report Authenticated By: Jolaine Click, M.D.   Dg Chest Portable 1 View  03/25/2013   *RADIOLOGY REPORT*  Clinical Data: Left PICC placement.  PORTABLE CHEST - 1 VIEW  Comparison: 03/25/2013.  Findings: Tracheostomy is midline.  Heart size within normal limits. Right hilum appears prominent, as  before.  Right IJ central line tip projects over the SVC.  Left PICC tip projects over the mid to low right atrium.  The lower left mediastinal contour is sharply demarcated, suggesting the presence of pleural air.  Improving airspace opacification in the left hemithorax without complete resolution. There is worsening air space disease in the medial right lower lobe.  IMPRESSION:  1.  Left PICC is low lying, appearing to terminate in the mid to low right atrium.  Retracting approximately 9-10 cm should better position the tip in the low SVC or SVC/RA junction. 2.  Sharply demarcated lower left mediastinal border is suspicious for left pneumothorax. 3. These results will be called to the ordering clinician or representative by the Radiologist Assistant, and communication documented in the PACS Dashboard. Improving airspace consolidation in the left hemithorax without resolution. 4.  Improving airspace consolidation in the left hemithorax without resolution. 5.  Worsening medial right lower lobe air space disease.   Original Report Authenticated By: Leanna Battles, M.D.   Dg Chest Portable 1 View  03/25/2013   *RADIOLOGY REPORT*  Clinical Data: Tracheostomy tube placement.  PORTABLE CHEST - 1 VIEW  Comparison: Earlier the same day  Findings: 1337 hours.  Tracheostomy tube now visualized in situ. The NG tube passes into the stomach although the distal tip position is not included on the film.  Right IJ central line tip overlies the innominate vein confluence.  There is persistent volume loss left hemithorax with some interval improvement in left lung aeration. Telemetry leads overlie the chest.  IMPRESSION: Volume loss in the left hemithorax with leftward shift of cardiomediastinal structures.   Original Report Authenticated By: Kennith Center, M.D.   Dg Abd Portable 1v  03/26/2013   *RADIOLOGY REPORT*  Clinical Data: Nasogastric tube placement.  PORTABLE ABDOMEN - 1 VIEW  Comparison: 03/16/2013  Findings:  Nasogastric tube is in place, tip overlying the level of the distal stomach or proximal duodenum.  The bowel gas pattern is nonobstructive.  Degenerative changes are seen in the lumbar spine.  IMPRESSION: Nasogastric tube to distal stomach/proximal duodenum.   Original Report Authenticated By: Norva Pavlov, M.D.     ASSESSMENT / PLAN:  PULMONARY A: Acute respiratory failure due to increased work of breathing from sepsis and inability to protect airway from encephalopathy 5/23 HCAP, severe P:     peep 5 for now,  abg follow pcxr Recruit Obstruction  treated by bronch 5-23  CARDIOVASCULAR A: Septic shock - on pressors 5-24 NSTEMI> demand ischaemia Echo - nml LV fn Post intubation hypotension 5/23 Trach 5-23 P:  -wean off vasopressin -cont norepi  INFECTIOUS A:  PRIOR Gram neg Septic shock due  to UTI and c.diff and cellulitis/skin breakdown on buttocks.  Now VRE UTI NEW CHAP 5/23 P:   - change vanco to zyvox -BC sent  Code status: full in select  TODAY'S SUMMARY: Vasopressors are weaning. Will stop vasopressin 5/25 and cont norepi  CC  Shan Levans Beeper  [phone]  Cell  [phone]  If no response or cell goes to voicemail, call beeper [phone]   03/27/2013, 1:29 PM

## 2013-03-28 ENCOUNTER — Other Ambulatory Visit (HOSPITAL_COMMUNITY): Payer: Self-pay

## 2013-03-28 LAB — CBC
MCH: 31.7 pg (ref 26.0–34.0)
MCHC: 31.9 g/dL (ref 30.0–36.0)
Platelets: 186 10*3/uL (ref 150–400)
RBC: 2.78 MIL/uL — ABNORMAL LOW (ref 3.87–5.11)

## 2013-03-28 LAB — CULTURE, BLOOD (ROUTINE X 2): Culture: NO GROWTH

## 2013-03-28 LAB — COMPREHENSIVE METABOLIC PANEL
ALT: 20 U/L (ref 0–35)
AST: 50 U/L — ABNORMAL HIGH (ref 0–37)
Calcium: 7.3 mg/dL — ABNORMAL LOW (ref 8.4–10.5)
Potassium: 3.1 mEq/L — ABNORMAL LOW (ref 3.5–5.1)
Sodium: 140 mEq/L (ref 135–145)
Total Protein: 4.3 g/dL — ABNORMAL LOW (ref 6.0–8.3)

## 2013-03-29 DIAGNOSIS — R6521 Severe sepsis with septic shock: Secondary | ICD-10-CM

## 2013-03-29 DIAGNOSIS — J9600 Acute respiratory failure, unspecified whether with hypoxia or hypercapnia: Secondary | ICD-10-CM

## 2013-03-29 DIAGNOSIS — N390 Urinary tract infection, site not specified: Secondary | ICD-10-CM

## 2013-03-29 DIAGNOSIS — J189 Pneumonia, unspecified organism: Secondary | ICD-10-CM

## 2013-03-29 LAB — BASIC METABOLIC PANEL
BUN: 20 mg/dL (ref 6–23)
CO2: 29 mEq/L (ref 19–32)
Chloride: 104 mEq/L (ref 96–112)
Creatinine, Ser: 0.37 mg/dL — ABNORMAL LOW (ref 0.50–1.10)
GFR calc Af Amer: >90 mL/min (ref >90)
Glucose, Bld: 132 mg/dL — ABNORMAL HIGH (ref 70–99)
Potassium: 3.8 mEq/L (ref 3.5–5.1)

## 2013-03-29 LAB — CULTURE, BLOOD (ROUTINE X 2): Culture: NO GROWTH

## 2013-03-29 NOTE — Progress Notes (Signed)
PULMONARY  / CRITICAL CARE MEDICINE  Name: Krista [name] MRN: [id number] DOB: 1955/11/14    ADMISSION DATE:  03/16/13 select CONSULTATION DATE:  03/25/13  REFERRING MD :  March Rummage PRIMARY SERVICE: Select MD  CHIEF COMPLAINT:  Septic shock   BRIEF PATIENT DESCRIPTION: 57 y/o female with recent c.diff and schizophrenia was brought in to [facility] ED on 5/7 by EMS after her family said she had been unresponsive for 24 hours.  She was in septic shock, AKI, and had diarrhea and a UTI. To select , decline >  PNA, collapse, ETT  SIGNIFICANT EVENTS / STUDIES:  5/7 CT head >>neg acute 5/7 CT neck >>neg acute 5/8 RUQ U/S >>chronic medical renal disease, Trace bilateral perinephric fluid 5/8 CT abdo/pelvis>>>Increased abdominal ascites and mesenteric edema. Inflammatory changes surround the pancreas. Question pancreatitis. New bilateral pleural effusions, right greater than left, with near total collapse of the right lower lobe. Multiple thoracolumbar compression fractures are stable. 5/8 Ct >>>neg edema, acute 5/14 to select 5/23- emergent intubation, pus all lobes and collapse, emergent bronch 5-23 trach 5-23 pressor dependent, cvp 4, on diuretics  LINES / TUBES: 5/7 ETT (EDP) >>5/12 5/7 L [place] CVL (EDP) >>5/8  5/8 rt IJ>>> 5/23 ETT>>>5-23 5-23 trach(DF)>>  CULTURES: 5/7 blood >>>enterobacter S to cipro 5/7 urine >>enterobacter S to cipro 5/7 c.diff >>POS 5/23 Bronch BAL>>> normal flora 5/21 BC>>>NGTD>> 5-21uc >VRE  ANTIBIOTICS: Per ssh  SUBJECTIVE:  On PS 12 with rr 22, Vt 444 Pressors off   VITAL SIGNS:  Vital signs reviewed. Abnormal values will appear under impression plan section. Off all pressors.     HEMODYNAMICS:   VENTILATOR SETTINGS:   INTAKE / OUTPUT: Intake/Output   None     PHYSICAL EXAMINATION:  Gen: chronically ill, no distress HEENT: no jvd, obese, trach cdi PULM: Rhonchi bilaterally  CV: s1 s2 rrr AB: BS increased, nontender, no hsm Ext: warm,  increased edema 2 plus Neuro:  Confused, , follows commands when stimulated  LABS:  Recent Labs Lab 03/23/13 0656 03/23/13 0845 03/24/13 0340  03/25/13 1100 03/25/13 1425  03/25/13 1754 03/26/13 1005 03/27/13 0716 03/27/13 1051 03/28/13 0530 03/29/13 0500  HGB 11.2*  --  10.6*  < >  --   --   < >  --  8.6* 9.4*  --  8.8*  --   WBC 17.4*  --  12.7*  < >  --   --   < >  --  6.8 7.5  --  5.5  --   PLT 288  --  254  < >  --   --   < >  --  147* 179  --  186  --   NA 144  --  146*  < >  --   --   --   --   --  138  --  140 140  K 5.1  --  3.7  < >  --   --   --   --   --  2.8*  --  3.1* 3.8  CL 103  --  104  < >  --   --   --   --   --  101  --  104 104  CO2 30  --  37*  < >  --   --   --   --   --  27  --  28 29  GLUCOSE 206*  --  159*  < >  --   --   --   --   --  237*  --  153* 132*  BUN 31*  --  33*  < >  --   --   --   --   --  30*  --  24* 20  CREATININE 0.61  --  0.64  < >  --   --   --   --   --  0.54  --  0.44* 0.37*  CALCIUM 8.1*  --  7.9*  < >  --   --   --   --   --  7.3*  --  7.3* 7.1*  MG 1.7  --  1.4*  --   --   --   --   --   --   --   --  1.2*  --   PHOS 4.4  --   --   --   --   --   --   --   --   --   --   --   --   AST  --   --   --   --   --   --   --   --   --  47*  --  50*  --   ALT  --   --   --   --   --   --   --   --   --  21  --  20  --   ALKPHOS  --   --   --   --   --   --   --   --   --  210*  --  208*  --   BILITOT  --   --   --   --   --   --   --   --   --  0.5  --  0.4  --   PROT  --   --   --   --   --   --   --   --   --  4.5*  --  4.3*  --   ALBUMIN 2.0*  --   --   --   --   --   --   --   --  1.6*  --  1.5*  --   LATICACIDVEN  --  2.0  --   --   --   --   --  2.0  --   --   --   --   --   PHART  --   --   --   --  7.366 7.390  --   --   --   --  7.526*  --   --   PCO2ART  --   --   --   --  60.2* 53.8*  --   --   --   --  34.8*  --   --   PO2ART  --   --   --   --  90.8 181.0*  --   --   --   --  113.0*  --   --   < > = values in this  interval not displayed. No results found for this basename: GLUCAP,  in the last 168 hours  Imaging: Dg Chest Port 1 View  03/28/2013   *RADIOLOGY REPORT*  Clinical Data: Sepsis.  Respiratory failure.  Ventilator support.  PORTABLE CHEST - 1 VIEW  Comparison: 03/27/2013  Findings: Artifact overlies chest.  Tracheostomy appears well  positioned.  Left arm PICC has its tip at the SVC/RA junction. Right lung remains clear.  There is worsened atelectasis/infiltrate in the left lower lobe.  Moderate amount of pleural fluid on the left persists.  IMPRESSION: Worsened atelectasis/infiltrate in the left lower lobe.   Original Report Authenticated By: Paulina Fusi, M.D.     ASSESSMENT / PLAN:  PULMONARY A: Acute respiratory failure due to increased work of breathing from sepsis and inability to protect airway from encephalopathy 5/23 HCAP, severe P:   follow pcxr Recruitments prn Obstruction treated by bronch 5-23 5/27 on ps 12 for 4 hours per protocol  CARDIOVASCULAR A: Septic shock - on pressors 5-24, resolved 5-27 NSTEMI> demand ischemia Echo - nml LV fn Post intubation hypotension 5/23 Trach 5-23 P:  -weaned off all pressors  INFECTIOUS A:  PRIOR Gram neg Septic shock due to UTI and c.diff and cellulitis/skin breakdown on buttocks.   New VRE UTI 5/21 New HCAP 5/23 P:   -changed vanco to zyvox -BC sent, NGTD  Code status: full in select  TODAY'S SUMMARY: Vasopressors are off. VVS. Working on weaning per protocol. Follow CXR for improvement LLL PNA  Brett Canales Minor ACNP [facility] PCCM Pager [phone] till 3 pm If no answer page [phone] 03/29/2013, 9:43 AM  Levy Pupa, MD, PhD 03/29/2013, 11:20 AM [place] [facility] [phone] or if no answer [phone]

## 2013-03-30 LAB — CBC
MCV: 100.3 fL — ABNORMAL HIGH (ref 78.0–100.0)
Platelets: 241 10*3/uL (ref 150–400)
RDW: 20.2 % — ABNORMAL HIGH (ref 11.5–15.5)
WBC: 4.9 10*3/uL (ref 4.0–10.5)

## 2013-03-30 LAB — BASIC METABOLIC PANEL
Chloride: 107 mEq/L (ref 96–112)
Creatinine, Ser: 0.39 mg/dL — ABNORMAL LOW (ref 0.50–1.10)
GFR calc Af Amer: >90 mL/min (ref >90)

## 2013-03-30 LAB — PRO B NATRIURETIC PEPTIDE: Pro B Natriuretic peptide (BNP): 2208 pg/mL — ABNORMAL HIGH (ref 0–125)

## 2013-03-31 ENCOUNTER — Other Ambulatory Visit (HOSPITAL_COMMUNITY): Payer: Self-pay

## 2013-03-31 LAB — CULTURE, BLOOD (ROUTINE X 2): Culture: NO GROWTH

## 2013-03-31 NOTE — Progress Notes (Signed)
PULMONARY  / CRITICAL CARE MEDICINE  Name: Krista [name] MRN: [id number] DOB: 01-Oct-1956    ADMISSION DATE:  03/16/13 select CONSULTATION DATE:  03/25/13  REFERRING MD :  March Rummage PRIMARY SERVICE: Select MD  CHIEF COMPLAINT:  Septic shock   BRIEF PATIENT DESCRIPTION: 57 y/o female with recent c.diff and schizophrenia was brought in to [facility] ED on 5/7 by EMS after her family said she had been unresponsive for 24 hours.  She was in septic shock, AKI, and had diarrhea and a UTI. To select , decline >  PNA, collapse, ETT  SIGNIFICANT EVENTS / STUDIES:  5/7 CT head >>neg acute 5/7 CT neck >>neg acute 5/8 RUQ U/S >>chronic medical renal disease, Trace bilateral perinephric fluid 5/8 CT abdo/pelvis>>>Increased abdominal ascites and mesenteric edema. Inflammatory changes surround the pancreas. Question pancreatitis. New bilateral pleural effusions, right greater than left, with near total collapse of the right lower lobe. Multiple thoracolumbar compression fractures are stable. 5/8 Ct >>>neg edema, acute 5/14 to select 5/23- emergent intubation, pus all lobes and collapse, emergent bronch 5-23 trach 5-23 pressor dependent, cvp 4, on diuretics  LINES / TUBES: 5/7 ETT (EDP) >>5/12 5/7 L [place] CVL (EDP) >>5/8  5/8 rt IJ>>> 5/23 ETT>>>5-23 5-23 trach(DF)>>  CULTURES: 5/7 blood >>>enterobacter S to cipro 5/7 urine >>enterobacter S to cipro 5/7 c.diff >>POS 5/23 Bronch BAL>>> normal flora 5/21 BC>>>NGTD>> 5-21uc >VRE  ANTIBIOTICS: Per ssh  SUBJECTIVE:  On PS 12 with rr 22, Vt 444 Pressors off   VITAL SIGNS:  Vital signs reviewed. Abnormal values will appear under impression plan section. Off all pressors.     HEMODYNAMICS:   VENTILATOR SETTINGS:   INTAKE / OUTPUT: Intake/Output   None     PHYSICAL EXAMINATION:  Gen: chronically ill, no distress HEENT: no jvd, obese, trach cdi PULM: Rhonchi bilaterally  CV: s1 s2 rrr AB: BS increased, nontender, no hsm Ext: warm,  increased edema 2 plus Neuro:  Confused, , follows commands when stimulated  LABS:  Recent Labs Lab 03/25/13 1100 03/25/13 1425  03/25/13 1754 03/26/13 1005 03/27/13 0716 03/27/13 1051 03/28/13 0530 03/29/13 0500 03/30/13 0530  HGB  --   --   < >  --  8.6* 9.4*  --  8.8*  --  9.2*  WBC  --   --   < >  --  6.8 7.5  --  5.5  --  4.9  PLT  --   --   < >  --  147* 179  --  186  --  241  NA  --   --   --   --  142 138  --  140 140 141  K  --   --   --   --  4.1 2.8*  --  3.1* 3.8 3.3*  CL  --   --   --   --  104 101  --  104 104 107  CO2  --   --   --   --  27 27  --  28 29 26   GLUCOSE  --   --   --   --  234* 237*  --  153* 132* 153*  BUN  --   --   --   --  38* 30*  --  24* 20 20  CREATININE  --   --   --   --  0.63 0.54  --  0.44* 0.37* 0.39*  CALCIUM  --   --   --   --  7.3* 7.3*  --  7.3* 7.1* 6.9*  MG  --   --   --   --   --   --   --  1.2*  --  1.5  AST  --   --   --   --  41* 47*  --  50*  --   --   ALT  --   --   --   --  18 21  --  20  --   --   ALKPHOS  --   --   --   --  158* 210*  --  208*  --   --   BILITOT  --   --   --   --  0.4 0.5  --  0.4  --   --   PROT  --   --   --   --  4.3* 4.5*  --  4.3*  --   --   ALBUMIN  --   --   --   --  1.5* 1.6*  --  1.5*  --   --   LATICACIDVEN  --   --   --  2.0  --   --   --   --   --   --   PROCALCITON  --   --   --   --  <0.10  --   --   --   --   --   PROBNP  --   --   --   --   --   --   --   --   --  2208.0*  PHART 7.366 7.390  --   --   --   --  7.526*  --   --   --   PCO2ART 60.2* 53.8*  --   --   --   --  34.8*  --   --   --   PO2ART 90.8 181.0*  --   --   --   --  113.0*  --   --   --   < > = values in this interval not displayed. No results found for this basename: GLUCAP,  in the last 168 hours  Imaging: Dg Chest Port 1 View  03/31/2013   *RADIOLOGY REPORT*  Clinical Data: Trach  PORTABLE CHEST - 1 VIEW  Comparison: 03/28/2013  Findings: Tracheostomy in satisfactory position at the thoracic inlet.  Increased  opacification of the left hemithorax, likely reflecting a layering moderate pleural effusion and underlying atelectasis.  Right lung is essentially clear.  No pneumothorax.  Stable left arm PICC and enteric tube.  IMPRESSION: Tracheostomy in satisfactory position.  Increased density in the left hemithorax, likely reflecting a layering moderate pleural effusion and underlying atelectasis.   Original Report Authenticated By: Charline Bills, M.D.     ASSESSMENT / PLAN:  PULMONARY A: Acute respiratory failure due to increased work of breathing from sepsis and inability to protect airway from encephalopathy 5/23 HCAP, severe P:   follow pcxr Recruitments prn Obstruction treated by bronch 5-23 Continue weaning per protocol  CARDIOVASCULAR A: Septic shock - on pressors 5-24, resolved 5-27 NSTEMI> demand ischemia Echo - nml LV fn Post intubation hypotension 5/23 Trach 5-23 P:  -weaned off all pressors -agree with light diuresis now that hemodynamically stable  INFECTIOUS A:  PRIOR Gram neg Septic shock due to UTI and c.diff and cellulitis/skin breakdown on buttocks.   New VRE UTI 5/21 New HCAP 5/23  P:   -changed vanco to zyvox -BC sent, NGTD  Code status: full in select  TODAY'S SUMMARY: Vasopressors are off. VVS. Working on weaning per protocol. Follow CXR for improvement LLL PNA  Levy Pupa, MD, PhD 03/31/2013, 10:20 AM [place] [facility] [phone] or if no answer [phone]

## 2013-04-01 ENCOUNTER — Other Ambulatory Visit (HOSPITAL_COMMUNITY): Payer: Self-pay

## 2013-04-01 LAB — BODY FLUID CELL COUNT WITH DIFFERENTIAL: Total Nucleated Cell Count, Fluid: 232 cu mm (ref 0–1000)

## 2013-04-01 LAB — COMPREHENSIVE METABOLIC PANEL
BUN: 21 mg/dL (ref 6–23)
Calcium: 8 mg/dL — ABNORMAL LOW (ref 8.4–10.5)
GFR calc Af Amer: >90 mL/min (ref >90)
Glucose, Bld: 132 mg/dL — ABNORMAL HIGH (ref 70–99)
Sodium: 141 mEq/L (ref 135–145)
Total Protein: 4.7 g/dL — ABNORMAL LOW (ref 6.0–8.3)

## 2013-04-01 LAB — AMYLASE, BODY FLUID: Amylase, Fluid: 13 U/L

## 2013-04-01 LAB — PATHOLOGIST SMEAR REVIEW

## 2013-04-01 NOTE — Procedures (Signed)
US guided L thora at bedside  120 cc yellow fluid obtained From small left effusion  Sent for labs per MD cxr pending

## 2013-04-03 ENCOUNTER — Other Ambulatory Visit (HOSPITAL_COMMUNITY): Payer: Self-pay

## 2013-04-03 LAB — BASIC METABOLIC PANEL
BUN: 20 mg/dL (ref 6–23)
CO2: 28 mEq/L (ref 19–32)
Chloride: 103 mEq/L (ref 96–112)
Glucose, Bld: 140 mg/dL — ABNORMAL HIGH (ref 70–99)
Potassium: 4.7 mEq/L (ref 3.5–5.1)

## 2013-04-03 LAB — CBC
HCT: 28.9 % — ABNORMAL LOW (ref 36.0–46.0)
Hemoglobin: 9.3 g/dL — ABNORMAL LOW (ref 12.0–15.0)
MCHC: 32.2 g/dL (ref 30.0–36.0)
RBC: 2.88 MIL/uL — ABNORMAL LOW (ref 3.87–5.11)
WBC: 6.5 10*3/uL (ref 4.0–10.5)

## 2013-04-03 DEATH — deceased

## 2013-04-04 ENCOUNTER — Other Ambulatory Visit (HOSPITAL_COMMUNITY): Payer: Self-pay

## 2013-04-04 ENCOUNTER — Encounter: Payer: Self-pay | Admitting: Radiology

## 2013-04-04 DIAGNOSIS — Z930 Tracheostomy status: Secondary | ICD-10-CM

## 2013-04-04 HISTORY — DX: Tracheostomy status: Z93.0

## 2013-04-04 LAB — BLOOD GAS, ARTERIAL
FIO2: 0.28 %
pCO2 arterial: 35.7 mmHg (ref 35.0–45.0)
pH, Arterial: 7.461 — ABNORMAL HIGH (ref 7.350–7.450)
pO2, Arterial: 105 mmHg — ABNORMAL HIGH (ref 80.0–100.0)

## 2013-04-04 MED ORDER — IOHEXOL 350 MG/ML SOLN
100.0000 mL | Freq: Once | INTRAVENOUS | Status: AC | PRN
  Administered 2013-04-04: 100 mL via INTRAVENOUS

## 2013-04-04 NOTE — Progress Notes (Addendum)
PULMONARY  / CRITICAL CARE MEDICINE  Name: EDEN TOOHEY MRN: [id number] DOB: 1956/08/14    ADMISSION DATE:  03/16/13 select CONSULTATION DATE:  03/25/13  REFERRING MD :  March Rummage PRIMARY SERVICE: Select MD  CHIEF COMPLAINT:  Septic shock   BRIEF PATIENT DESCRIPTION: 57 y/o female with recent c.diff and schizophrenia was brought in to [facility] ED on 5/7 by EMS after her family said she had been unresponsive for 24 hours.  She was in septic shock, AKI, and had diarrhea and a UTI. To select , decline >  PNA, collapse, ETT  SIGNIFICANT EVENTS / STUDIES:  5/7 CT head >>neg acute 5/7 CT neck >>neg acute 5/8 RUQ U/S >>chronic medical renal disease, Trace bilateral perinephric fluid 5/8 CT abdo/pelvis>>>Increased abdominal ascites and mesenteric edema. Inflammatory changes surround the pancreas. Question pancreatitis. New bilateral pleural effusions, right greater than left, with near total collapse of the right lower lobe. Multiple thoracolumbar compression fractures are stable. 5/8 Ct >>>neg edema, acute 5/14 to select 5/23- emergent intubation, pus all lobes and collapse, emergent bronch 5-23 trach 5-23 pressor dependent, cvp 4, on diuretics  LINES / TUBES: 5/7 ETT (EDP) >>5/12 5/7 L [place] CVL (EDP) >>5/8  5/8 rt IJ>>>out 5/23 ETT>>>5-23 5-23 trach(DF)>>  CULTURES: 5/7 blood >>>enterobacter S to cipro 5/7 urine >>enterobacter S to cipro 5/7 c.diff >>POS 5/23 Bronch BAL>>> normal flora 5/21 BC>>>NGTD>> 5-21uc >VRE  ANTIBIOTICS: Per ssh  SUBJECTIVE/overnight:  Off pressors.  Tol ATC x 8 hours yesterday.    VITAL SIGNS:  Vital signs reviewed. Abnormal values will appear under impression plan section. Off all pressors.     HEMODYNAMICS:   VENTILATOR SETTINGS:   INTAKE / OUTPUT: Intake/Output   None     PHYSICAL EXAMINATION:  Gen: chronically ill, no distress HEENT: no jvd, obese, trach cdi PULM: resps even non labored on full support, Rhonchi bilaterally  CV: s1 s2  rrr AB: BS increased, nontender, no hsm Ext: warm, increased edema 2 plus Neuro:  Sleeping, easily arousable, nods appropriately, gen weakness   LABS:  Recent Labs Lab 03/30/13 0530 04/01/13 0350 04/03/13 0529  HGB 9.2*  --  9.3*  WBC 4.9  --  6.5  PLT 241  --  325  NA 141 141 138  K 3.3* 4.4 4.7  CL 107 106 103  CO2 26 30 28   GLUCOSE 153* 132* 140*  BUN 20 21 20   CREATININE 0.39* 0.42* 0.47*  CALCIUM 6.9* 8.0* 8.2*  MG 1.5  --   --   AST  --  89*  --   ALT  --  35  --   ALKPHOS  --  260*  --   BILITOT  --  0.3  --   PROT  --  4.7*  --   ALBUMIN  --  1.6*  --   INR  --   --  1.03  PROBNP 2208.0*  --   --    No results found for this basename: GLUCAP,  in the last 168 hours  Imaging: Dg Chest Port 1 View  04/04/2013   *RADIOLOGY REPORT*  Clinical Data: Respiratory failure.  PORTABLE CHEST - 1 VIEW  Comparison: 04/03/2013  Findings: The patient remains rotated towards the left side.  The left arm PICC line is near the cavoatrial junction.  Limited evaluation of the left lung base due to patient rotation but there may be slightly improved aeration at the left lung base.  Right lung remains clear. Nasogastric tube extends into the  abdomen.  IMPRESSION: Slightly improved aeration at the left lung base.   Original Report Authenticated By: Richarda Overlie, M.D.   Dg Chest Port 1 View  04/03/2013   *RADIOLOGY REPORT*  Clinical Data: Respiratory failure  PORTABLE CHEST - 1 VIEW  Comparison: 04/01/2013  Findings: Tracheostomy terminates at/just above the thoracic inlet.  Left lower lobe opacity, likely reflecting a combination of atelectasis and moderate pleural effusion, increased.  The heart is top normal in size.  Stable left arm PICC.  Enteric tube coursing to the stomach.  IMPRESSION: Tracheostomy terminates at/just above the thoracic inlet.  Left lower lobe opacity, likely reflecting a combination of atelectasis and moderate pleural effusion, increased.   Original Report Authenticated By:  Charline Bills, M.D.     ASSESSMENT / PLAN:  PULMONARY A: Acute respiratory failure s/p trach--  Multifactorial r/t AMS, sepsis, HCAP.   P:   follow pcxr intermittently  Recruitments prn Cont weaning per protocol - tol some ATC   CARDIOVASCULAR A: Septic shock - on pressors 5-24, resolved 5-27 NSTEMI> demand ischemia Echo - nml LV fn Trach 5-23 P:  - off pressors  - cont gentle diuresis for neg balance as tol   INFECTIOUS A:  PRIOR Gram neg Septic shock due to UTI and c.diff and cellulitis/skin breakdown on buttocks.   New VRE UTI 5/21 New HCAP 5/23 P:   -abx per primary   Code status: full in select  WHITEHEART,KATHRYN, NP 04/04/2013  11:15 AM Pager: [phone] or [phone]  *Care during the described time interval was provided by me and/or other providers on the critical care team. I have reviewed this patient's available data, including medical history, events of note, physical examination and test results as part of my evaluation.   I have interviewed and examined the patient and reviewed the database. I have formulated the assessment and plan as reflected in the note above with amendments made by me.  Tolerating ATC. CTA chest ordered by primary team  Billy Fischer, MD;  PCCM service; Mobile [phone]

## 2013-04-05 DIAGNOSIS — N19 Unspecified kidney failure: Secondary | ICD-10-CM

## 2013-04-05 LAB — BODY FLUID CULTURE: Special Requests: 60

## 2013-04-05 NOTE — Progress Notes (Signed)
*  PRELIMINARY RESULTS* Vascular Ultrasound Lower extremity venous duplex has been completed.  Preliminary findings: Technically difficult, not all veins clearly visualized.  Right = DVT involving the posterior tibial and peroneal veins. No other DVT noted in visualized veins. Large baker's cyst noted.  Left = no obvious evidence of DVT in visualized veins. Small baker's cyst noted.    Farrel Demark, RDMS, RVT  04/05/2013, 3:18 PM

## 2013-04-06 ENCOUNTER — Other Ambulatory Visit (HOSPITAL_COMMUNITY): Payer: Self-pay

## 2013-04-06 DIAGNOSIS — J9819 Other pulmonary collapse: Secondary | ICD-10-CM

## 2013-04-06 LAB — BASIC METABOLIC PANEL
CO2: 26 mEq/L (ref 19–32)
GFR calc non Af Amer: >90 mL/min (ref >90)
Glucose, Bld: 143 mg/dL — ABNORMAL HIGH (ref 70–99)
Potassium: 4.4 mEq/L (ref 3.5–5.1)
Sodium: 131 mEq/L — ABNORMAL LOW (ref 135–145)

## 2013-04-06 LAB — PROTIME-INR: Prothrombin Time: 13.1 seconds (ref 11.6–15.2)

## 2013-04-06 LAB — CBC
Hemoglobin: 9.6 g/dL — ABNORMAL LOW (ref 12.0–15.0)
Hemoglobin: 9.6 g/dL — ABNORMAL LOW (ref 12.0–15.0)
MCHC: 33.3 g/dL (ref 30.0–36.0)
MCHC: 33.6 g/dL (ref 30.0–36.0)
Platelets: 291 10*3/uL (ref 150–400)
Platelets: 289 10*3/uL (ref 150–400)
RBC: 2.96 MIL/uL — ABNORMAL LOW (ref 3.87–5.11)
RBC: 2.91 MIL/uL — ABNORMAL LOW (ref 3.87–5.11)
RDW: 19.3 % — ABNORMAL HIGH (ref 11.5–15.5)
WBC: 10.6 10*3/uL — ABNORMAL HIGH (ref 4.0–10.5)

## 2013-04-06 NOTE — Procedures (Signed)
Indication:   Mucus plugging  Premedication:    Sedation:  Versed 4 mg  Anesthesia: 15 cc 1% lidocaine  Procedure: After adequate sedation and anesthesia, the bronchoscope was introduced via the ETT and advanced into the trachea. Airway anesthesia was obtained with 1% lidocaine and a complete airway examination was performed. This revealed the following.  Findings:  Normal bronchial tree on R. L main bronchus was obstructed with thick mucoid secretions. After removal, L bronchial tree was normal  Specimens:   None  Post procedure evaluation:  The patient tolerated the procedure well with no major complications    Billy Fischer, MD;  PCCM service; Mobile [phone]

## 2013-04-06 NOTE — Progress Notes (Signed)
PULMONARY  / CRITICAL CARE MEDICINE  Name: Krista [name] MRN: [id number] DOB: 1956/07/12    ADMISSION DATE:  03/16/13 select CONSULTATION DATE:  03/25/13  REFERRING MD :  March Rummage PRIMARY SERVICE: Select MD  CHIEF COMPLAINT:  Septic shock   BRIEF PATIENT DESCRIPTION: 57 y/o female with recent c.diff and schizophrenia was brought in to [facility] ED on 5/7 by EMS after her family said she had been unresponsive for 24 hours.  She was in septic shock, AKI, and had diarrhea and a UTI. To select , decline >  PNA, collapse, ETT  SIGNIFICANT EVENTS / STUDIES:  5/7 CT head >>neg acute 5/7 CT neck >>neg acute 5/8 RUQ U/S >>chronic medical renal disease, Trace bilateral perinephric fluid 5/8 CT abdo/pelvis>>>Increased abdominal ascites and mesenteric edema. Inflammatory changes surround the pancreas. Question pancreatitis. New bilateral pleural effusions, right greater than left, with near total collapse of the right lower lobe. Multiple thoracolumbar compression fractures are stable. 5/8 Ct >>>neg edema, acute 5/14 to select 5/23- emergent intubation, pus all lobes and collapse, emergent bronch 5-23 trach 5-23 pressor dependent, cvp 4, on diuretics  LINES / TUBES: 5/7 ETT (EDP) >>5/12 5/7 L [place] CVL (EDP) >>5/8  5/8 rt IJ>>>out 5/23 ETT>>>5-23 5-23 trach(DF)>>  CULTURES: 5/7 blood >>>enterobacter S to cipro 5/7 urine >>enterobacter S to cipro 5/7 c.diff >>POS 5/23 Bronch BAL>>> normal flora 5/21 BC>>>NGTD>> 5-21uc >VRE  ANTIBIOTICS: Per ssh  SUBJECTIVE/overnight:  Off pressors.  Tol ATC x 8 hours yesterday.    VITAL SIGNS:  Vital signs reviewed. Abnormal values will appear under impression plan section. Off all pressors.     HEMODYNAMICS:   VENTILATOR SETTINGS:   INTAKE / OUTPUT: Intake/Output   None     PHYSICAL EXAMINATION:  Gen: chronically ill, no distress HEENT: no jvd, obese, trach cdi PULM: resps even non labored on full support, Rhonchi bilaterally, decreased on  left.   CV: s1 s2 rrr AB: BS increased, nontender, no hsm Ext: warm, increased edema 2 plus Neuro:  Sleeping, easily arousable, nods appropriately, gen weakness   LABS:  Recent Labs Lab 04/01/13 0350 04/03/13 0529 04/04/13 1305 04/06/13 0440  HGB  --  9.3*  --  9.6*  WBC  --  6.5  --  9.6  PLT  --  325  --  291  NA 141 138  --  131*  K 4.4 4.7  --  4.4  CL 106 103  --  101  CO2 30 28  --  26  GLUCOSE 132* 140*  --  143*  BUN 21 20  --  19  CREATININE 0.42* 0.47*  --  0.39*  CALCIUM 8.0* 8.2*  --  8.0*  AST 89*  --   --   --   ALT 35  --   --   --   ALKPHOS 260*  --   --   --   BILITOT 0.3  --   --   --   PROT 4.7*  --   --   --   ALBUMIN 1.6*  --   --   --   INR  --  1.03  --   --   PHART  --   --  7.461*  --   PCO2ART  --   --  35.7  --   PO2ART  --   --  105.0*  --    No results found for this basename: GLUCAP,  in the last 168 hours  Imaging: Ct Angio  Chest Pe W/cm &/or Wo Cm  04/05/2013   *RADIOLOGY REPORT*  Clinical Data: Septic shock, pneumonia  CT ANGIOGRAPHY CHEST  Technique:  Multidetector CT imaging of the chest using the standard protocol during bolus administration of intravenous contrast. Multiplanar reconstructed images including MIPs were obtained and reviewed to evaluate the vascular anatomy.  Contrast: OMNIPAQUE IOHEXOL 350 MG/ML SOLN  Comparison: 04/04/2013 chest radiograph  Findings: Tracheostomy tube is appropriately positioned.  Left subclavian approach central line tip terminates in the distal SVC. Nasogastric tube tip terminates below the level of the hemidiaphragms but is not included on the exam.  Heart size is mildly enlarged.  There is near total left lung collapse with only trace residual aerated lung nondependently.  Moderate left pleural effusion noted with element of loculation and component tracking along the major fissure.  Leftward mediastinal shift is present. The atelectatic lung is perfused.  Vessels are attenuated within the  atelectatic lung segments on the left but no filling defect is identified up to the 3rd order pulmonary arteries to suggest acute pulmonary embolism.  No right- sided pulmonary serial filling defect is seen up to and including the 3rd order pulmonary arteries.  There is a total apparent occlusion of the left mainstem bronchus by material higher density than that typical for simple appearing fluid.  Examination is degraded by patient motion.  Minimal dependent right basilar atelectasis is noted.  The right lung is otherwise clear. No compression deformity or acute osseous abnormality.  IMPRESSION: Near total collapse of the left lung with loculated moderate size left pleural effusion.  Conclusion of the left mainstem bronchus by material, possibly secretions, pus, or soft tissue.  No CT evidence for pulmonary embolism up to the 3rd order pulmonary arteries on the left and including the 3rd order pulmonary arteries on the right.   Original Report Authenticated By: Christiana Pellant, M.D.   Dg Chest Port 1 View  04/06/2013   *RADIOLOGY REPORT*  Clinical Data: Respiratory failure, tracheostomy  PORTABLE CHEST - 1 VIEW  Comparison: Portable chest x-ray of 04/04/2013 and CT angio chest of 04/05/2013  Findings: Compared to the chest x-ray of 06/02, there is slightly more opacity in the left hemithorax which by CT represents collapse of the left partial collapse of left lung and left pleural effusion.  Tracheostomy and left central venous line are unchanged in position and an NG tube remains.  The right lung is clear.  IMPRESSION: Little change to slightly more opacity within the left hemithorax, as noted above, most consistent with atelectasis and left effusion.   Original Report Authenticated By: Dwyane Dee, M.D.   Agree/ worsening Left lung atx   ASSESSMENT / PLAN:  1) Acute respiratory failure s/p trach--  Multifactorial r/t AMS, sepsis, HCAP.  Trach 5-23 2) left lung collapse/ ATX P:   follow pcxr  intermittently  Recruitments prn FOB today  Cont weaning per protocol - tol some ATC   NSTEMI> demand ischemia Echo - nml LV fn P:  - off pressors  - cont gentle diuresis for neg balance as tol   RLE DVT P: Per # 1 service   PRIOR Gram neg Septic shock due to UTI and c.diff and cellulitis/skin breakdown on buttocks.   New VRE UTI 5/21 New HCAP 5/23 P:   -abx per primary   Code status: full in select  BABCOCK,PETE,   I have interviewed and examined the patient and reviewed the database. I have formulated the assessment and plan as reflected in  the note above with amendments made by me.   Billy Fischer, MD;  PCCM service; Mobile [phone]

## 2013-04-07 ENCOUNTER — Other Ambulatory Visit (HOSPITAL_COMMUNITY): Payer: Self-pay

## 2013-04-07 DIAGNOSIS — R404 Transient alteration of awareness: Secondary | ICD-10-CM

## 2013-04-07 DIAGNOSIS — N179 Acute kidney failure, unspecified: Secondary | ICD-10-CM

## 2013-04-07 DIAGNOSIS — R109 Unspecified abdominal pain: Secondary | ICD-10-CM

## 2013-04-07 NOTE — Progress Notes (Signed)
Patient ID: Krista [name], female   DOB: 20-Aug-1956, 57 y.o.   MRN: [id number] Request received for placement of percutaneous gastrostomy tube in pt with history of septic shock/VDRF/trach placement/PNA/encephalopathy/AKI/MI/rhabdomyolysis and protein calorie malnutrition. PMH also sig for schizophrenia, DM, sacral decubitus/cellulitis/c diff. Westley Chandler RLE DVT.       Vitals: BP 129/93  HR 96  R 19  TEMP 98.7  O2 SATS 100%(TRACH). EXAM: pt awake but lethargic/opens eyes, chest- dim BS left base, right clear; heart- RRR; abd- obese,soft+BS,NT; ext- 2+ edema bilat LE. Pt has NG tube in place. Past Medical History  Diagnosis Date  . Arthritis   . Hyperlipidemia   . Hypertension   . Allergy   . GERD (gastroesophageal reflux disease)   . Back pain   . Type II diabetes mellitus   . Schizophrenia     /notes 02/17/2013   Past Surgical History  Procedure Laterality Date  . Tubal ligation     Ct Abdomen Pelvis Wo Contrast  03/10/2013   *RADIOLOGY REPORT*  Clinical Data: 50 pounds weight loss.  Evaluate for that bowel.  CT ABDOMEN AND PELVIS WITHOUT CONTRAST  Technique:  Multidetector CT imaging of the abdomen and pelvis was performed following the standard protocol without intravenous contrast.  Comparison: CT abdomen and pelvis without contrast 01/31/2013.  Findings: The moderate bilateral pleural effusions are more evident on the right.  There is significant collapse of the right lower lobe. The heart size is normal.  A small amount of pericardial fluid is noted.  The liver and spleen are within normal limits.  Abdominal ascites are now present.  An NG tube is evident within the stomach.  The stomach and duodenum are within normal limits.  Diffuse inflammatory changes around the pancreas.  Pancreatitis is not excluded.  The patient is status post cholecystectomy.  The common bile duct is within normal limits.  The adrenal glands are normal bilaterally.  The kidneys are stable.  The transverse and descending  colon are mostly collapsed.  Contrast material extends to the proximal transverse colon.  The small bowel is unremarkable.  There is some stranding of the mesentery.  No pneumatosis is present.  The uterus and adnexa are within normal limits for age.  A small amount of dependent free fluid is present. The balloon catheters present within the rectum.  Remote fractures at T11, L1, L2, and L5 are stable.  IMPRESSION:  1.  No evidence for pneumatosis. 2.  Increased abdominal ascites and mesenteric edema. 3.  Inflammatory changes surround the pancreas.  Question pancreatitis. 4.  New bilateral pleural effusions, right greater than left, with near total collapse of the right lower lobe. 5.  Multiple thoracolumbar compression fractures are stable.   Original Report Authenticated By: Marin Roberts, M.D.   Dg Abd 1 View  03/16/2013   *RADIOLOGY REPORT*  Clinical Data: NG tube.  ABDOMEN - 1 VIEW  Comparison: Abdomen radiograph 03/15/2013.  Findings: NG tube extends past the stomach to the level of the duodenal bulb.  Left lower lobe airspace disease persist.  The bowel gas pattern is unremarkable.  IMPRESSION:  1.  The tip of the NG tube is suspected to be through the stomach, to the duodenal bulb. 2.  Persistent left lower lobe airspace disease.   Original Report Authenticated By: Marin Roberts, M.D.   Ct Head Wo Contrast  03/10/2013   *RADIOLOGY REPORT*  Clinical Data: Pupillary changes.  Question of diffuse anoxic injury?  CT HEAD WITHOUT CONTRAST  Technique:  Contiguous axial images were obtained from the base of the skull through the vertex without contrast.  Comparison: 03/09/2013 and 01/19/2013.  Findings: No intracranial hemorrhage or CT evidence of large acute infarct.  No definitive CT findings of diffuse anoxic injury.  Mild atrophy without hydrocephalus.  No intracranial mass lesion detected on this unenhanced exam.  Partial opacification mastoid air cells.  Air fluid levels maxillary sinuses and  sphenoid air cells.  IMPRESSION: No intracranial hemorrhage or CT evidence of large acute infarct.  No definitive CT findings of diffuse anoxic injury.  Partial opacification mastoid air cells.  Air fluid levels maxillary sinuses and sphenoid air cells.   Original Report Authenticated By: Lacy Duverney, M.D.   Ct Head Wo Contrast  03/09/2013   *RADIOLOGY REPORT*  Clinical Data:  Altered mental status for 2 days. Septic shock. History of diabetes mellitus, hypertension, and schizophrenia  CT HEAD WITHOUT CONTRAST CT CERVICAL SPINE WITHOUT CONTRAST  Technique:  Multidetector CT imaging of the head and cervical spine was performed following the standard protocol without intravenous contrast.  Multiplanar CT image reconstructions of the cervical spine were also generated.  Comparison:  CT head 02/17/2013.  CT HEAD  Findings: Premature for age cerebral atrophy without focal areas of infarction or hemorrhage.  No extra-axial fluid collection. Suspected chronic microvascular ischemic change of the white matter.  No evidence for diffuse cerebral edema or focal large vessel infarct.  No evidence for increased intracranial pressure. Calvarium intact.  Sinus air-fluid levels, left greater than right in the maxillary regions could be related to prolonged recumbency and  nasogastric tube placement.  IMPRESSION: No acute intracranial findings.  Stable appearance to the brain with premature atrophy and probable small vessel type changes of white matter. Left greater than right maxillary sinus fluid.  CT CERVICAL SPINE  Findings: No visible cervical spine fracture or traumatic subluxation.  Mild spondylosis C5-C6.  An endotracheal tube appears in good position.  Nasogastric tube in the esophagus. Right upper lobe infiltrate/effusion.  Heterogeneous thyroid.  Mild edema of the subcutaneous soft tissues may reflect prolonged recumbency or sepsis  IMPRESSION: No acute cervical spine abnormality.   Original Report Authenticated By:  Davonna Belling, M.D.   Ct Angio Chest Pe W/cm &/or Wo Cm  04/05/2013   *RADIOLOGY REPORT*  Clinical Data: Septic shock, pneumonia  CT ANGIOGRAPHY CHEST  Technique:  Multidetector CT imaging of the chest using the standard protocol during bolus administration of intravenous contrast. Multiplanar reconstructed images including MIPs were obtained and reviewed to evaluate the vascular anatomy.  Contrast: OMNIPAQUE IOHEXOL 350 MG/ML SOLN  Comparison: 04/04/2013 chest radiograph  Findings: Tracheostomy tube is appropriately positioned.  Left subclavian approach central line tip terminates in the distal SVC. Nasogastric tube tip terminates below the level of the hemidiaphragms but is not included on the exam.  Heart size is mildly enlarged.  There is near total left lung collapse with only trace residual aerated lung nondependently.  Moderate left pleural effusion noted with element of loculation and component tracking along the major fissure.  Leftward mediastinal shift is present. The atelectatic lung is perfused.  Vessels are attenuated within the atelectatic lung segments on the left but no filling defect is identified up to the 3rd order pulmonary arteries to suggest acute pulmonary embolism.  No right- sided pulmonary serial filling defect is seen up to and including the 3rd order pulmonary arteries.  There is a total apparent occlusion of the left mainstem bronchus by material higher density than that  typical for simple appearing fluid.  Examination is degraded by patient motion.  Minimal dependent right basilar atelectasis is noted.  The right lung is otherwise clear. No compression deformity or acute osseous abnormality.  IMPRESSION: Near total collapse of the left lung with loculated moderate size left pleural effusion.  Conclusion of the left mainstem bronchus by material, possibly secretions, pus, or soft tissue.  No CT evidence for pulmonary embolism up to the 3rd order pulmonary arteries on the left and  including the 3rd order pulmonary arteries on the right.   Original Report Authenticated By: Christiana Pellant, M.D.   Ct Cervical Spine Wo Contrast  03/09/2013   *RADIOLOGY REPORT*  Clinical Data:  Altered mental status for 2 days. Septic shock. History of diabetes mellitus, hypertension, and schizophrenia  CT HEAD WITHOUT CONTRAST CT CERVICAL SPINE WITHOUT CONTRAST  Technique:  Multidetector CT imaging of the head and cervical spine was performed following the standard protocol without intravenous contrast.  Multiplanar CT image reconstructions of the cervical spine were also generated.  Comparison:  CT head 02/17/2013.  CT HEAD  Findings: Premature for age cerebral atrophy without focal areas of infarction or hemorrhage.  No extra-axial fluid collection. Suspected chronic microvascular ischemic change of the white matter.  No evidence for diffuse cerebral edema or focal large vessel infarct.  No evidence for increased intracranial pressure. Calvarium intact.  Sinus air-fluid levels, left greater than right in the maxillary regions could be related to prolonged recumbency and  nasogastric tube placement.  IMPRESSION: No acute intracranial findings.  Stable appearance to the brain with premature atrophy and probable small vessel type changes of white matter. Left greater than right maxillary sinus fluid.  CT CERVICAL SPINE  Findings: No visible cervical spine fracture or traumatic subluxation.  Mild spondylosis C5-C6.  An endotracheal tube appears in good position.  Nasogastric tube in the esophagus. Right upper lobe infiltrate/effusion.  Heterogeneous thyroid.  Mild edema of the subcutaneous soft tissues may reflect prolonged recumbency or sepsis  IMPRESSION: No acute cervical spine abnormality.   Original Report Authenticated By: Davonna Belling, M.D.   US Abdomen Complete  03/10/2013   *RADIOLOGY REPORT*  Clinical Data:  Septic shock, elevated alkaline phosphatase.  COMPLETE ABDOMINAL ULTRASOUND  Comparison:  CT  abdomen pelvis 01/31/2013 and renal ultrasound 01/20/2013.  Findings:  Gallbladder:  Surgically absent.  Common bile duct:  Measures up to 9 mm, unchanged from the 01/31/2013 and in keeping with post cholecystectomy state.  Liver:  No focal lesion identified.  Within normal limits in parenchymal echogenicity.  IVC:  Appears normal.  Pancreas:  Majority of the pancreas is obscured by bowel gas. Pancreatic head is grossly within normal limits.  Spleen:  Measures 6.6 cm, negative.  Right Kidney:  Measures 12.9 cm.  Parenchymal echogenicity is increased.  No hydronephrosis.  No focal lesion.  Small perinephric fluid.  Left Kidney:  Measures 12.7 cm.  Parenchymal echogenicity is increased.  No hydronephrosis.  No focal lesion.  Small perinephric fluid.  Abdominal aorta:  Mid and distal portions are obscured by bowel gas.  Otherwise, no aneurysm.  Additional findings:  There are bilateral pleural effusions and small ascites.  IMPRESSION: 1.  Bilateral pleural effusions and small ascites. 2.  Increased renal parenchymal echogenicity is indicative of chronic medical renal disease. 3.  Trace bilateral perinephric fluid.   Original Report Authenticated By: Leanna Battles, M.D.   Dg Chest Port 1 View  04/07/2013   *RADIOLOGY REPORT*  Clinical Data: Respiratory failure.  PORTABLE  CHEST - 1 VIEW  Comparison: 04/06/2013  Findings: Support devices are in stable position.  Improving aeration in the left lung.  Small left pleural effusion with continued left lower lobe atelectasis or infiltrate.  Right lung is clear.  IMPRESSION: Improving aeration in the left lung.  Continued left lower lobe atelectasis or infiltrate and small left effusion.   Original Report Authenticated By: Charlett Nose, M.D.   Dg Chest Port 1 View  04/06/2013   *RADIOLOGY REPORT*  Clinical Data: Respiratory failure, tracheostomy  PORTABLE CHEST - 1 VIEW  Comparison: Portable chest x-ray of 04/04/2013 and CT angio chest of 04/05/2013  Findings: Compared to  the chest x-ray of 06/02, there is slightly more opacity in the left hemithorax which by CT represents collapse of the left partial collapse of left lung and left pleural effusion.  Tracheostomy and left central venous line are unchanged in position and an NG tube remains.  The right lung is clear.  IMPRESSION: Little change to slightly more opacity within the left hemithorax, as noted above, most consistent with atelectasis and left effusion.   Original Report Authenticated By: Dwyane Dee, M.D.   Dg Chest Port 1 View  04/04/2013   *RADIOLOGY REPORT*  Clinical Data: Respiratory failure.  PORTABLE CHEST - 1 VIEW  Comparison: 04/03/2013  Findings: The patient remains rotated towards the left side.  The left arm PICC line is near the cavoatrial junction.  Limited evaluation of the left lung base due to patient rotation but there may be slightly improved aeration at the left lung base.  Right lung remains clear. Nasogastric tube extends into the abdomen.  IMPRESSION: Slightly improved aeration at the left lung base.   Original Report Authenticated By: Richarda Overlie, M.D.   Dg Chest Port 1 View  04/03/2013   *RADIOLOGY REPORT*  Clinical Data: Respiratory failure  PORTABLE CHEST - 1 VIEW  Comparison: 04/01/2013  Findings: Tracheostomy terminates at/just above the thoracic inlet.  Left lower lobe opacity, likely reflecting a combination of atelectasis and moderate pleural effusion, increased.  The heart is top normal in size.  Stable left arm PICC.  Enteric tube coursing to the stomach.  IMPRESSION: Tracheostomy terminates at/just above the thoracic inlet.  Left lower lobe opacity, likely reflecting a combination of atelectasis and moderate pleural effusion, increased.   Original Report Authenticated By: Charline Bills, M.D.   Dg Chest Portable 1 View  04/01/2013   *RADIOLOGY REPORT*  Clinical Data: Status post thoracentesis.  PORTABLE CHEST - 1 VIEW  Comparison: Single view of the chest 03/31/2013.  Findings: Left  pleural effusion is decreased after thoracentesis. No pneumothorax is identified. Scattered opacity throughout the left chest is unchanged.  There is volume loss on the left.  Right lung is clear.  Heart size upper normal.  Support apparatus is unchanged.  IMPRESSION: Negative for pneumothorax after left thoracentesis.   Original Report Authenticated By: Holley Dexter, M.D.   Dg Chest Port 1 View  03/31/2013   *RADIOLOGY REPORT*  Clinical Data: Trach  PORTABLE CHEST - 1 VIEW  Comparison: 03/28/2013  Findings: Tracheostomy in satisfactory position at the thoracic inlet.  Increased opacification of the left hemithorax, likely reflecting a layering moderate pleural effusion and underlying atelectasis.  Right lung is essentially clear.  No pneumothorax.  Stable left arm PICC and enteric tube.  IMPRESSION: Tracheostomy in satisfactory position.  Increased density in the left hemithorax, likely reflecting a layering moderate pleural effusion and underlying atelectasis.   Original Report Authenticated By: Charline Bills,  M.D.   Dg Chest Port 1 View  03/28/2013   *RADIOLOGY REPORT*  Clinical Data: Sepsis.  Respiratory failure.  Ventilator support.  PORTABLE CHEST - 1 VIEW  Comparison: 03/27/2013  Findings: Artifact overlies chest.  Tracheostomy appears well positioned.  Left arm PICC has its tip at the SVC/RA junction. Right lung remains clear.  There is worsened atelectasis/infiltrate in the left lower lobe.  Moderate amount of pleural fluid on the left persists.  IMPRESSION: Worsened atelectasis/infiltrate in the left lower lobe.   Original Report Authenticated By: Paulina Fusi, M.D.   Dg Chest Port 1 View  03/27/2013   *RADIOLOGY REPORT*  Clinical Data: Respiratory failure  PORTABLE CHEST - 1 VIEW  Comparison: Yesterday  Findings: NG tube, tracheostomy tube, left arm PICC are stable. Hazy left basilar and mid lung airspace disease is stable. External object now projects over the mediastinum.  Right lung  clear.  No pneumothorax.  IMPRESSION: Stable left mid and lower lung zone airspace disease.   Original Report Authenticated By: Jolaine Click, M.D.   Dg Chest Portable 1 View  03/26/2013   *RADIOLOGY REPORT*  Clinical Data: Post tracheostomy  PORTABLE CHEST - 1 VIEW  Comparison: 03/25/2013; 03/23/2013; 03/27/2013; 03/14/2013  Findings:  Grossly unchanged cardiac silhouette and mediastinal contours. Stable positioning of support apparatus.  No pneumothorax. Continued improved aeration of the left lung with persistent left perihilar and medial basilar heterogeneous / consolidative opacities.  Trace left-sided effusion is not excluded.  The right hemithorax is unchanged.  Unchanged bones.  IMPRESSION: 1.  Stable positioning of support apparatus.  No pneumothorax. 2.  Improved aeration of the left lower lung with persistent left perihilar and basilar opacities, atelectasis versus infiltrate.   Original Report Authenticated By: Tacey Ruiz, MD   Dg Chest Port 1 View  03/25/2013   *RADIOLOGY REPORT*  Clinical Data: PICC placement.  PORTABLE CHEST - 1 VIEW  Comparison: Chest radiograph performed earlier today in 07:53 p.m.  Findings: The patient's left PICC is now seen ending about the cavoatrial junction.  The tracheostomy tube is seen ending 2-3 cm above the carina.  An enteric tube is noted extending below the diaphragm.  There is persistent left basilar airspace opacification, which may reflect atelectasis or possibly pneumonia.  The right lung appears clear.  No pleural effusion or pneumothorax is seen.  The cardiomediastinal silhouette remains normal in size.  No acute osseous abnormalities are identified.  IMPRESSION:  1.  Left PICC seen ending about the cavoatrial junction. 2.  Persistent left basilar airspace opacification may reflect atelectasis or possibly pneumonia.   Original Report Authenticated By: Tonia Ghent, M.D.   [facility] 1 View  03/25/2013   *RADIOLOGY REPORT*  Clinical Data: PICC   adjustment  PORTABLE CHEST - 1 VIEW  Comparison: 1835 hours  Findings: The left PICC has been advanced and the tip is in the azygos vein.  Bibasilar opacities have increased with lower lung volumes.  IMPRESSION: Left PICC advanced with its tip now in the azygos vein.  Increased bibasilar atelectasis versus consolidation.   Original Report Authenticated By: Jolaine Click, M.D.   Dg Chest Port 1 View  03/25/2013   *RADIOLOGY REPORT*  Clinical Data: PICC line readjustment  PORTABLE CHEST - 1 VIEW  Comparison: 1550 hours  Findings: Left PICC has been retracted.  The tip is now in the left innominate vein.  Otherwise stable exam.  IMPRESSION: Left PICC retracted.  The tip is now in the left innominate vein. Otherwise  stable.   Original Report Authenticated By: Jolaine Click, M.D.   Dg Chest Portable 1 View  03/25/2013   *RADIOLOGY REPORT*  Clinical Data: Left PICC placement.  PORTABLE CHEST - 1 VIEW  Comparison: 03/25/2013.  Findings: Tracheostomy is midline.  Heart size within normal limits. Right hilum appears prominent, as before.  Right IJ central line tip projects over the SVC.  Left PICC tip projects over the mid to low right atrium.  The lower left mediastinal contour is sharply demarcated, suggesting the presence of pleural air.  Improving airspace opacification in the left hemithorax without complete resolution. There is worsening air space disease in the medial right lower lobe.  IMPRESSION:  1.  Left PICC is low lying, appearing to terminate in the mid to low right atrium.  Retracting approximately 9-10 cm should better position the tip in the low SVC or SVC/RA junction. 2.  Sharply demarcated lower left mediastinal border is suspicious for left pneumothorax. 3. These results will be called to the ordering clinician or representative by the Radiologist Assistant, and communication documented in the PACS Dashboard. Improving airspace consolidation in the left hemithorax without resolution. 4.  Improving  airspace consolidation in the left hemithorax without resolution. 5.  Worsening medial right lower lobe air space disease.   Original Report Authenticated By: Leanna Battles, M.D.   Dg Chest Portable 1 View  03/25/2013   *RADIOLOGY REPORT*  Clinical Data: Tracheostomy tube placement.  PORTABLE CHEST - 1 VIEW  Comparison: Earlier the same day  Findings: 1337 hours.  Tracheostomy tube now visualized in situ. The NG tube passes into the stomach although the distal tip position is not included on the film.  Right IJ central line tip overlies the innominate vein confluence.  There is persistent volume loss left hemithorax with some interval improvement in left lung aeration. Telemetry leads overlie the chest.  IMPRESSION: Volume loss in the left hemithorax with leftward shift of cardiomediastinal structures.   Original Report Authenticated By: Kennith Center, M.D.   Dg Chest Portable 1 View  03/25/2013   *RADIOLOGY REPORT*  Clinical Data: Post ETT placement  PORTABLE CHEST - 1 VIEW  Comparison: 03/23/2013  Findings: Near complete opacification of the left hemithorax, new/increased.  Left lung is essentially clear.  No pneumothorax.  Endotracheal tube terminates 3 cm above the carina.  Stable right IJ venous catheter and enteric tube.  Cardiomediastinal is obscured.  IMPRESSION: Endotracheal tube terminates 3 cm above the carina.  Near complete opacification of the left hemithorax, new/increased. This may reflect mucous plugging and/or a combination of atelectasis / infection and pleural effusion.   Original Report Authenticated By: Charline Bills, M.D.   Dg Chest Port 1 View  03/23/2013   *RADIOLOGY REPORT*  Clinical Data: Evaluate pneumonia  PORTABLE CHEST - 1 VIEW  Comparison: 2013/04/04  Findings: There is a right IJ catheter with tip in the projection of the cavoatrial junction.  Nasogastric tube is in the stomach. Persistent opacities throughout the left lung appear unchanged from previous exam.  Right lung  is clear.  IMPRESSION:  1.  No change in aeration to the left lung compared with previous exam.   Original Report Authenticated By: Signa Kell, M.D.   Dg Chest Port 1 View  04-04-2013   *RADIOLOGY REPORT*  Clinical Data: Respiratory failure  PORTABLE CHEST - 1 VIEW  Comparison: 03/14/2013; 03/13/2013  Findings: Grossly unchanged cardiac silhouette and mediastinal contours given patient rotation.  Interval extubation.  Interval repositioning of enteric tube with tip  and side port now projects below the left hemidiaphragm.  Stable positioning of right jugular approach and venous catheter tip projects of the superior cavoatrial junction.  Worsening left mid and lower lung heterogeneous / consolidative opacities with associated volume loss.  Trace / small left sided effusion is suspected.  The right hemithorax is grossly unchanged.  No definite pneumothorax. Grossly unchanged bones.  IMPRESSION: 1.  Interval extubation and repositioning of enteric tube.  No pneumothorax.  2.  Worsening left lung opacities and volume loss, atelectasis versus infiltrate.   Original Report Authenticated By: Tacey Ruiz, MD   Dg Chest Port 1 View  03/14/2013   *RADIOLOGY REPORT*  Clinical Data: Follow up of pneumonia.  History of gastroesophageal reflux disease and type 2 diabetes.  PORTABLE CHEST - 1 VIEW  Comparison: 03/13/2013  Findings: Right internal jugular line and endotracheal tube are unchanged and appropriate in position. Nasogastric tube is looped in the stomach with its tip directed cephalad, in the gastric body.  Cardiomegaly accentuated by AP portable technique.  Probable small bilateral pleural effusions.  The left effusion is similar.  The right effusion is new.  Similar to minimal improvement in left base aeration.  Development of right base air space disease.  IMPRESSION: Shifting airspace opacities.  Minimally improved at the left base with development of right base air space disease.  Question infection versus  atelectasis.  Similar small left and development of a small right pleural effusion.   Original Report Authenticated By: Jeronimo Greaves, M.D.   Dg Chest Port 1 View  03/13/2013   *RADIOLOGY REPORT*  Clinical Data: Pneumonia.  PORTABLE CHEST - 1 VIEW  Comparison: Chest 03/11/2013 and 03/12/2013.  Findings: Support tubes and lines are unchanged.  Left basilar airspace disease shows some interval worsening.  Right lung remains clear.  No pneumothorax.  There is likely left pleural effusion.  IMPRESSION: Some worsening in left basilar airspace disease and small left effusion.   Original Report Authenticated By: Holley Dexter, M.D.   Dg Chest Port 1 View  03/12/2013   *RADIOLOGY REPORT*  Clinical Data: Endotracheal tube placement  PORTABLE CHEST - 1 VIEW  Comparison: 03/11/2013  Findings: Right IJ line tip projects over the SVC.  Nasogastric tube coils once in the stomach and terminates near the stomach cardia.  Endotracheal tube observed with tip 1.7 cm above the carina.  The patient is rotated to the left on today's exam, resulting in reduced diagnostic sensitivity and specificity.  Worsened left lower lobe airspace opacity causes retrocardiac density. Indistinct density at the right lung base, query layering pleural effusion or atelectasis.  Heart size difficult to estimate due to the degree of rotation.  IMPRESSION:  1.  Worsened airspace opacity in the left lower lobe, potentially from atelectasis, pneumonia, or shifting pleural effusion. 2.  Suspected layering pleural effusion on the right. 3.  Endotracheal tube tip 1.7 cm above the carina.   Original Report Authenticated By: Gaylyn Rong, M.D.   Dg Chest Port 1 View  03/11/2013   *RADIOLOGY REPORT*  Clinical Data: Endotracheal tube.  PORTABLE CHEST - 1 VIEW  Comparison: 03/10/2013.  Findings: Endotracheal tube is in place with the tip 1.2 cm above the carina.  This needs to be monitored closely as this could enter the right mainstem bronchus with  change in patients neck position.  Right central line tip mid superior vena cava level.  Nasogastric tube courses below the diaphragm.  The tip is not included on this exam.  Hazy  parenchymal changes lung bases greater on the right may represent shifting pleural fluid within the fissures.  Difficult to exclude right base infiltrate or atelectasis.  Slightly tortuous aorta.  Heart size within normal limits.  IMPRESSION: Endotracheal tube is in place with the tip 1.2 cm above the carina. This needs to be monitored closely as this could enter the right mainstem bronchus with change in patients neck position.  Hazy parenchymal changes lung bases greater on the right may represent shifting pleural fluid within the fissures.  Difficult to exclude right base infiltrate or atelectasis.   Original Report Authenticated By: Lacy Duverney, M.D.   Dg Chest Portable 1 View  03/10/2013   *RADIOLOGY REPORT*  Clinical Data: Line placement.  PORTABLE CHEST - 1 VIEW  Comparison: Chest 03/10/2013 at 5:24 a.m.  Findings: The patient has a new right IJ catheter with the tip projecting in the lower superior vena cava.  ET tube and NG tube are again noted.  Right effusion basilar airspace disease are unchanged.  Atelectasis in the left lung base again seen.  IMPRESSION: Right IJ catheter tip projects over the lower superior vena cava. No pneumothorax or other change.   Original Report Authenticated By: Holley Dexter, M.D.   Dg Chest Port 1 View  03/10/2013   *RADIOLOGY REPORT*  Clinical Data: Respiratory failure  PORTABLE CHEST - 1 VIEW  Comparison: 03/09/2013  Findings: Endotracheal tube tip ends approximately 2 cm above the carina.  Nasogastric tube continues extend below the diaphragm. Central line positioning stable.  Lungs show slightly improved expansion of the left lung.  Residual volume loss of the right lung is stable.  The heart size is within normal limits.  No pulmonary edema.  IMPRESSION: Improved aeration of the left  lung.  Stable volume loss of the right lung.   Original Report Authenticated By: Irish Lack, M.D.   Dg Chest Portable 1 View  03/09/2013   *RADIOLOGY REPORT*  Clinical Data: Altered mental status, central line placement  PORTABLE CHEST - 1 VIEW  Comparison: Chest radiograph 03/09/2013  Findings: Endotracheal tube and NG tube are unchanged.  Interval placement of a left central venous line from a  subclavian approach.  The tip is in the mid SVC.  No pneumothorax.  Low lung volumes again demonstrated.  IMPRESSION:  Interval placement of left central venous line without complication.   Original Report Authenticated By: Genevive Bi, M.D.   Dg Chest Portable 1 View  03/09/2013   *RADIOLOGY REPORT*  Clinical Data: Altered mental status  PORTABLE CHEST - 1 VIEW  Comparison: Chest radiograph 02/17/2013  Findings: Endotracheal tube is 2.7 cm from carina.  NG tube is coiled within the gastric body.  Normal cardiac silhouette.  Low lung volumes.  No pulmonary edema.  No pneumothorax.  IMPRESSION:  1. Endotracheal tube 3 cm from carina. 2.  NG tube in stomach.   Original Report Authenticated By: Genevive Bi, M.D.   Dg Abd Portable 1v  04/07/2013   *RADIOLOGY REPORT*  Clinical Data: 57 year old female NG tube placement.  PORTABLE ABDOMEN - 1 VIEW  Comparison: 04/06/2013 and earlier.  Findings: Portable supine AP view 0637 hours.  Enteric tube and stable position, tip at the level of the distal stomach.  The stomach is less distended.  Mildly increased small bowel gas in the abdomen, no dilated small bowel loops.  Large bowel gas again seen to the level of the descending colon.  Interval improved ventilation at the left lung base.  IMPRESSION: Stable enteric  tube, tip at the level of the distal stomach.   Original Report Authenticated By: Erskine Speed, M.D.   Dg Abd Portable 1v  04/06/2013   *RADIOLOGY REPORT*  Clinical Data: NG tube placement.  PORTABLE ABDOMEN - 1 VIEW  Comparison: 04/01/2013  Findings: NG  tube tip is in the distal stomach.  Gas within the stomach, large and small bowel.  No evidence of bowel obstruction. Findings may reflect mild ileus.  IMPRESSION: NG tube tip in the distal stomach.   Original Report Authenticated By: Charlett Nose, M.D.   Dg Abd Portable 1v  04/01/2013   *RADIOLOGY REPORT*  Clinical Data: Tube placement.  PORTABLE ABDOMEN - 1 VIEW  Comparison: 04/01/2013 at 1641 hours  Findings: Nasogastric tube terminates at the antral pyloric region. Motion degraded exam.  Patient rotated left.  Small left pleural effusion with adjacent left base air space disease.  Gas filled large and small bowel loops within the mid abdomen.  IMPRESSION: Feeding tube terminating at the antral pyloric region.  Prominent gas-filled bowel loops, question adynamic ileus.  Multifactorial degradation.  Left-sided pleural effusion and adjacent airspace disease.   Original Report Authenticated By: Jeronimo Greaves, M.D.   Dg Abd Portable 1v  04/01/2013   *RADIOLOGY REPORT*  Clinical Data: Nasogastric tube placement.  PORTABLE ABDOMEN - 1 VIEW  Comparison: 03/26/2013  Findings: Nasogastric tube extends into the mid stomach. Underlying bowel gas shows mild ileus pattern without overt obstruction.  IMPRESSION: Nasogastric tube tip lies in the mid stomach.   Original Report Authenticated By: Irish Lack, M.D.   Dg Abd Portable 1v  03/26/2013   *RADIOLOGY REPORT*  Clinical Data: Nasogastric tube placement.  PORTABLE ABDOMEN - 1 VIEW  Comparison: 03/16/2013  Findings: Nasogastric tube is in place, tip overlying the level of the distal stomach or proximal duodenum.  The bowel gas pattern is nonobstructive.  Degenerative changes are seen in the lumbar spine.  IMPRESSION: Nasogastric tube to distal stomach/proximal duodenum.   Original Report Authenticated By: Norva Pavlov, M.D.   Dg Abd Portable 1v  03/15/2013   *RADIOLOGY REPORT*  Clinical Data: Nasogastric tube placement.  PORTABLE ABDOMEN - 1 VIEW 2014 1402  hours:  Comparison: CT abdomen and pelvis 03/10/2013.  Findings: The tip of the nasogastric tube projects over the distal esophagus, and needs to be advanced several centimeters.  Bowel gas pattern unremarkable.  IMPRESSION: Nasogastric tube tip projects over the distal esophagus.  This needs to be advanced several centimeters.  No acute abdominal abnormality.  These results will be called to the ordering clinician or representative by the Radiologist Assistant, and communication documented in the PACS Dashboard.   Original Report Authenticated By: Hulan Saas, M.D.   US Thoracentesis Asp Pleural Space W/img Guide  04/01/2013   *RADIOLOGY REPORT*  Clinical Data:  Left pleural effusion  ULTRASOUND GUIDED left THORACENTESIS: PORTABLE  Comparison:  None  An ultrasound guided thoracentesis was thoroughly discussed with the patient and questions answered.  The benefits, risks, alternatives and complications were also discussed.  The patient understands and wishes to proceed with the procedure.  Written consent was obtained.  Ultrasound was performed to localize and mark an adequate pocket of fluid in the left chest.  The area was then prepped and draped in the normal sterile fashion.  1% Lidocaine was used for local anesthesia.  Under ultrasound guidance a 19 gauge Yueh catheter was introduced.  Thoracentesis was performed.  The catheter was removed and a dressing applied.  Complications:  None  Findings: A total of approximately 120 ml of yellow fluid was removed. A fluid sample was sent for laboratory analysis.  IMPRESSION: Successful ultrasound guided left thoracentesis yielding 120 ml of pleural fluid.  Read by: Ralene Muskrat, P.A.-C   Original Report Authenticated By: D. Andria Rhein, MD  Results for orders placed during the hospital encounter of 03/16/13  URINE CULTURE      Result Value Range   Specimen Description URINE, RANDOM     Special Requests Normal     Culture  Setup Time 03/21/2013 17:24      Colony Count 8,000 COLONIES/ML     Culture INSIGNIFICANT GROWTH     Report Status 03/22/2013 FINAL    CULTURE, BLOOD (ROUTINE X 2)      Result Value Range   Specimen Description BLOOD RIGHT HAND     Special Requests BOTTLES DRAWN AEROBIC ONLY 5CC     Culture  Setup Time 03/22/2013 01:51     Culture NO GROWTH 5 DAYS     Report Status 03/28/2013 FINAL    CULTURE, BLOOD (ROUTINE X 2)      Result Value Range   Specimen Description BLOOD LEFT HAND     Special Requests BOTTLES DRAWN AEROBIC ONLY 5CC     Culture  Setup Time 03/22/2013 01:51     Culture NO GROWTH 5 DAYS     Report Status 03/28/2013 FINAL    URINE CULTURE      Result Value Range   Specimen Description URINE, RANDOM     Special Requests Normal     Culture  Setup Time 03/23/2013 14:23     Colony Count >=100,000 COLONIES/ML     Culture       Value: VANCOMYCIN RESISTANT ENTEROCOCCUS ISOLATED     Note: CRITICAL RESULT CALLED TO, READ BACK BY AND VERIFIED WITH: CRYSTAL M. ON 5.25.14 @ 1200 BY FERGK   Report Status 03/27/2013 FINAL     Organism ID, Bacteria VANCOMYCIN RESISTANT ENTEROCOCCUS ISOLATED    CULTURE, BLOOD (ROUTINE X 2)      Result Value Range   Specimen Description BLOOD CENTRAL LINE     Special Requests BOTTLES DRAWN AEROBIC AND ANAEROBIC 10CC     Culture  Setup Time 03/23/2013 14:16     Culture NO GROWTH 5 DAYS     Report Status 03/29/2013 FINAL    CULTURE, BLOOD (ROUTINE X 2)      Result Value Range   Specimen Description BLOOD LEFT ARM     Special Requests BOTTLES DRAWN AEROBIC ONLY 3CC     Culture  Setup Time 03/23/2013 14:15     Culture NO GROWTH 5 DAYS     Report Status 03/29/2013 FINAL    CULTURE, RESPIRATORY (NON-EXPECTORATED)      Result Value Range   Specimen Description TRACHEAL ASPIRATE     Special Requests NONE     Gram Stain       Value: MODERATE WBC PRESENT,BOTH PMN AND MONONUCLEAR     RARE SQUAMOUS EPITHELIAL CELLS PRESENT     FEW GRAM POSITIVE RODS     FEW GRAM POSITIVE COCCI     IN  PAIRS   Culture Non-Pathogenic Oropharyngeal-type Flora Isolated.     Report Status 03/26/2013 FINAL    CULTURE, BLOOD (ROUTINE X 2)      Result Value Range   Specimen Description BLOOD LEFT HAND     Special Requests BOTTLES DRAWN AEROBIC ONLY 5CC     Culture  Setup Time 03/25/2013  13:35     Culture NO GROWTH 5 DAYS     Report Status 03/31/2013 FINAL    CULTURE, BLOOD (ROUTINE X 2)      Result Value Range   Specimen Description BLOOD     Special Requests BOTTLES DRAWN AEROBIC AND ANAEROBIC 10CC RT IJ     Culture  Setup Time 03/25/2013 14:34     Culture       Value: STAPHYLOCOCCUS SPECIES (COAGULASE NEGATIVE)     Note: THE SIGNIFICANCE OF ISOLATING THIS ORGANISM FROM A SINGLE SET OF BLOOD CULTURES WHEN MULTIPLE SETS ARE DRAWN IS UNCERTAIN. PLEASE NOTIFY THE MICROBIOLOGY DEPARTMENT WITHIN ONE WEEK IF SPECIATION AND SENSITIVITIES ARE REQUIRED.     Note: Gram Stain Report Called to,Read Back By and Verified With: CRYSTAL MONTAGUE 03/26/13 @ 6:37PM BY RUSCA.   Report Status 03/27/2013 FINAL    CULTURE, RESPIRATORY (NON-EXPECTORATED)      Result Value Range   Specimen Description TRACHEAL ASPIRATE     Special Requests NONE     Gram Stain       Value: MODERATE WBC PRESENT, PREDOMINANTLY PMN     RARE SQUAMOUS EPITHELIAL CELLS PRESENT     RARE GRAM POSITIVE RODS   Culture Non-Pathogenic Oropharyngeal-type Flora Isolated.     Report Status 03/27/2013 FINAL    BODY FLUID CULTURE      Result Value Range   Specimen Description PLEURAL FLUID LEFT     Special Requests 60 ML FLUID     Gram Stain       Value: FEW WBC PRESENT, PREDOMINANTLY MONONUCLEAR     NO ORGANISMS SEEN   Culture NO GROWTH 3 DAYS     Report Status 04/05/2013 FINAL    CBC WITH DIFFERENTIAL      Result Value Range   WBC 9.2  4.0 - 10.5 K/uL   RBC 2.57 (*) 3.87 - 5.11 MIL/uL   Hemoglobin 7.6 (*) 12.0 - 15.0 g/dL   HCT 96.0 (*) 45.4 - 09.8 %   MCV 86.0  78.0 - 100.0 fL   MCH 29.6  26.0 - 34.0 pg   MCHC 34.4  30.0 - 36.0  g/dL   RDW 11.9  14.7 - 82.9 %   Platelets 168  150 - 400 K/uL   Neutrophils Relative % 72  43 - 77 %   Lymphocytes Relative 16  12 - 46 %   Monocytes Relative 11  3 - 12 %   Eosinophils Relative 1  0 - 5 %   Basophils Relative 0  0 - 1 %   Neutro Abs 6.6  1.7 - 7.7 K/uL   Lymphs Abs 1.5  0.7 - 4.0 K/uL   Monocytes Absolute 1.0  0.1 - 1.0 K/uL   Eosinophils Absolute 0.1  0.0 - 0.7 K/uL   Basophils Absolute 0.0  0.0 - 0.1 K/uL   RBC Morphology POLYCHROMASIA PRESENT     WBC Morphology MILD LEFT SHIFT (1-5% METAS, OCC MYELO, OCC BANDS)    COMPREHENSIVE METABOLIC PANEL      Result Value Range   Sodium 136  135 - 145 mEq/L   Potassium 3.1 (*) 3.5 - 5.1 mEq/L   Chloride 100  96 - 112 mEq/L   CO2 29  19 - 32 mEq/L   Glucose, Bld 181 (*) 70 - 99 mg/dL   BUN 47 (*) 6 - 23 mg/dL   Creatinine, Ser 5.62 (*) 0.50 - 1.10 mg/dL   Calcium 8.0 (*) 8.4 - 10.5 mg/dL  Total Protein 3.9 (*) 6.0 - 8.3 g/dL   Albumin 1.7 (*) 3.5 - 5.2 g/dL   AST 161 (*) 0 - 37 U/L   ALT 41 (*) 0 - 35 U/L   Alkaline Phosphatase 634 (*) 39 - 117 U/L   Total Bilirubin 0.6  0.3 - 1.2 mg/dL   GFR calc non Af Amer 34 (*) >90 mL/min   GFR calc Af Amer 40 (*) >90 mL/min  SEDIMENTATION RATE      Result Value Range   Sed Rate 60 (*) 0 - 22 mm/hr  MAGNESIUM      Result Value Range   Magnesium 1.7  1.5 - 2.5 mg/dL  PREALBUMIN      Result Value Range   Prealbumin 19.1  17.0 - 34.0 mg/dL  PRO B NATRIURETIC PEPTIDE      Result Value Range   Pro B Natriuretic peptide (BNP) 3472.0 (*) 0 - 125 pg/mL  PROCALCITONIN      Result Value Range   Procalcitonin 0.69    HEMOGLOBIN AND HEMATOCRIT, BLOOD      Result Value Range   Hemoglobin 7.6 (*) 12.0 - 15.0 g/dL   HCT 09.6 (*) 04.5 - 40.9 %  CBC WITH DIFFERENTIAL      Result Value Range   WBC 11.0 (*) 4.0 - 10.5 K/uL   RBC 2.71 (*) 3.87 - 5.11 MIL/uL   Hemoglobin 8.3 (*) 12.0 - 15.0 g/dL   HCT 81.1 (*) 91.4 - 78.2 %   MCV 86.7  78.0 - 100.0 fL   MCH 30.6  26.0 - 34.0 pg    MCHC 35.3  30.0 - 36.0 g/dL   RDW 95.6  21.3 - 08.6 %   Platelets 219  150 - 400 K/uL   Neutrophils Relative % 72  43 - 77 %   Lymphocytes Relative 16  12 - 46 %   Monocytes Relative 12  3 - 12 %   Eosinophils Relative 0  0 - 5 %   Basophils Relative 0  0 - 1 %   Neutro Abs 7.9 (*) 1.7 - 7.7 K/uL   Lymphs Abs 1.8  0.7 - 4.0 K/uL   Monocytes Absolute 1.3 (*) 0.1 - 1.0 K/uL   Eosinophils Absolute 0.0  0.0 - 0.7 K/uL   Basophils Absolute 0.0  0.0 - 0.1 K/uL   RBC Morphology POLYCHROMASIA PRESENT    BASIC METABOLIC PANEL      Result Value Range   Sodium 137  135 - 145 mEq/L   Potassium 3.3 (*) 3.5 - 5.1 mEq/L   Chloride 101  96 - 112 mEq/L   CO2 26  19 - 32 mEq/L   Glucose, Bld 180 (*) 70 - 99 mg/dL   BUN 45 (*) 6 - 23 mg/dL   Creatinine, Ser 5.78 (*) 0.50 - 1.10 mg/dL   Calcium 7.9 (*) 8.4 - 10.5 mg/dL   GFR calc non Af Amer 52 (*) >90 mL/min   GFR calc Af Amer 60 (*) >90 mL/min  CK      Result Value Range   Total CK 385 (*) 7 - 177 U/L  AMMONIA      Result Value Range   Ammonia 39  11 - 60 umol/L  FERRITIN      Result Value Range   Ferritin 1144 (*) 10 - 291 ng/mL  IRON AND TIBC      Result Value Range   Iron 59  42 - 135 ug/dL   TIBC  172 (*) 250 - 470 ug/dL   Saturation Ratios 34  20 - 55 %   UIBC 113 (*) 125 - 400 ug/dL  FOLATE RBC      Result Value Range   RBC Folate 724 (*) >=366 ng/mL  VITAMIN B12      Result Value Range   Vitamin B-12 950 (*) 211 - 911 pg/mL  BASIC METABOLIC PANEL      Result Value Range   Sodium 140  135 - 145 mEq/L   Potassium 3.3 (*) 3.5 - 5.1 mEq/L   Chloride 102  96 - 112 mEq/L   CO2 28  19 - 32 mEq/L   Glucose, Bld 200 (*) 70 - 99 mg/dL   BUN 42 (*) 6 - 23 mg/dL   Creatinine, Ser 9.52  0.50 - 1.10 mg/dL   Calcium 7.8 (*) 8.4 - 10.5 mg/dL   GFR calc non Af Amer 77 (*) >90 mL/min   GFR calc Af Amer 90 (*) >90 mL/min  CBC      Result Value Range   WBC 10.7 (*) 4.0 - 10.5 K/uL   RBC 3.42 (*) 3.87 - 5.11 MIL/uL   Hemoglobin 10.3  (*) 12.0 - 15.0 g/dL   HCT 84.1 (*) 32.4 - 40.1 %   MCV 90.1  78.0 - 100.0 fL   MCH 30.1  26.0 - 34.0 pg   MCHC 33.4  30.0 - 36.0 g/dL   RDW 02.7 (*) 25.3 - 66.4 %   Platelets 219  150 - 400 K/uL  BASIC METABOLIC PANEL      Result Value Range   Sodium 143  135 - 145 mEq/L   Potassium 2.9 (*) 3.5 - 5.1 mEq/L   Chloride 102  96 - 112 mEq/L   CO2 31  19 - 32 mEq/L   Glucose, Bld 172 (*) 70 - 99 mg/dL   BUN 37 (*) 6 - 23 mg/dL   Creatinine, Ser 4.03  0.50 - 1.10 mg/dL   Calcium 7.5 (*) 8.4 - 10.5 mg/dL   GFR calc non Af Amer >90  >90 mL/min   GFR calc Af Amer >90  >90 mL/min  HEMOGLOBIN A1C      Result Value Range   Hemoglobin A1C 5.8 (*) <5.7 %   Mean Plasma Glucose 120 (*) <117 mg/dL  COMPREHENSIVE METABOLIC PANEL      Result Value Range   Sodium 146 (*) 135 - 145 mEq/L   Potassium 3.5  3.5 - 5.1 mEq/L   Chloride 104  96 - 112 mEq/L   CO2 33 (*) 19 - 32 mEq/L   Glucose, Bld 183 (*) 70 - 99 mg/dL   BUN 32 (*) 6 - 23 mg/dL   Creatinine, Ser 4.74  0.50 - 1.10 mg/dL   Calcium 7.5 (*) 8.4 - 10.5 mg/dL   Total Protein 4.5 (*) 6.0 - 8.3 g/dL   Albumin 2.2 (*) 3.5 - 5.2 g/dL   AST 48 (*) 0 - 37 U/L   ALT 19  0 - 35 U/L   Alkaline Phosphatase 250 (*) 39 - 117 U/L   Total Bilirubin 0.5  0.3 - 1.2 mg/dL   GFR calc non Af Amer >90  >90 mL/min   GFR calc Af Amer >90  >90 mL/min  CBC WITH DIFFERENTIAL      Result Value Range   WBC 11.5 (*) 4.0 - 10.5 K/uL   RBC 2.28 (*) 3.87 - 5.11 MIL/uL   Hemoglobin 7.0 (*) 12.0 -  15.0 g/dL   HCT 16.1 (*) 09.6 - 04.5 %   MCV 94.3  78.0 - 100.0 fL   MCH 30.7  26.0 - 34.0 pg   MCHC 32.6  30.0 - 36.0 g/dL   RDW 40.9 (*) 81.1 - 91.4 %   Platelets 277  150 - 400 K/uL   Neutrophils Relative % 79 (*) 43 - 77 %   Neutro Abs 9.1 (*) 1.7 - 7.7 K/uL   Lymphocytes Relative 14  12 - 46 %   Lymphs Abs 1.6  0.7 - 4.0 K/uL   Monocytes Relative 7  3 - 12 %   Monocytes Absolute 0.8  0.1 - 1.0 K/uL   Eosinophils Relative 0  0 - 5 %   Eosinophils Absolute  0.0  0.0 - 0.7 K/uL   Basophils Relative 0  0 - 1 %   Basophils Absolute 0.0  0.0 - 0.1 K/uL  MAGNESIUM      Result Value Range   Magnesium 1.0 (*) 1.5 - 2.5 mg/dL  HEMOGLOBIN AND HEMATOCRIT, BLOOD      Result Value Range   Hemoglobin 7.1 (*) 12.0 - 15.0 g/dL   HCT 78.2 (*) 95.6 - 21.3 %  PROTIME-INR      Result Value Range   Prothrombin Time 14.2  11.6 - 15.2 seconds   INR 1.11  0.00 - 1.49  URINALYSIS, ROUTINE W REFLEX MICROSCOPIC      Result Value Range   Color, Urine YELLOW  YELLOW   APPearance CLEAR  CLEAR   Specific Gravity, Urine 1.018  1.005 - 1.030   pH 6.5  5.0 - 8.0   Glucose, UA NEGATIVE  NEGATIVE mg/dL   Hgb urine dipstick NEGATIVE  NEGATIVE   Bilirubin Urine NEGATIVE  NEGATIVE   Ketones, ur NEGATIVE  NEGATIVE mg/dL   Protein, ur NEGATIVE  NEGATIVE mg/dL   Urobilinogen, UA 0.2  0.0 - 1.0 mg/dL   Nitrite NEGATIVE  NEGATIVE   Leukocytes, UA SMALL (*) NEGATIVE  HEMOGLOBIN AND HEMATOCRIT, BLOOD      Result Value Range   Hemoglobin 7.2 (*) 12.0 - 15.0 g/dL   HCT 08.6 (*) 57.8 - 46.9 %  PROTIME-INR      Result Value Range   Prothrombin Time 13.5  11.6 - 15.2 seconds   INR 1.04  0.00 - 1.49  URINE MICROSCOPIC-ADD ON      Result Value Range   Squamous Epithelial / LPF FEW (*) RARE   WBC, UA 3-6  <3 WBC/hpf   Bacteria, UA RARE  RARE   Casts HYALINE CASTS (*) NEGATIVE   Crystals CA OXALATE CRYSTALS (*) NEGATIVE  RENAL FUNCTION PANEL      Result Value Range   Sodium 144  135 - 145 mEq/L   Potassium 3.2 (*) 3.5 - 5.1 mEq/L   Chloride 102  96 - 112 mEq/L   CO2 32  19 - 32 mEq/L   Glucose, Bld 216 (*) 70 - 99 mg/dL   BUN 31 (*) 6 - 23 mg/dL   Creatinine, Ser 6.29  0.50 - 1.10 mg/dL   Calcium 7.7 (*) 8.4 - 10.5 mg/dL   Phosphorus 3.4  2.3 - 4.6 mg/dL   Albumin 2.1 (*) 3.5 - 5.2 g/dL   GFR calc non Af Amer >90  >90 mL/min   GFR calc Af Amer >90  >90 mL/min  CBC      Result Value Range   WBC 11.3 (*) 4.0 - 10.5 K/uL   RBC  3.48 (*) 3.87 - 5.11 MIL/uL    Hemoglobin 10.8 (*) 12.0 - 15.0 g/dL   HCT 16.1 (*) 09.6 - 04.5 %   MCV 92.2  78.0 - 100.0 fL   MCH 31.0  26.0 - 34.0 pg   MCHC 33.6  30.0 - 36.0 g/dL   RDW 40.9 (*) 81.1 - 91.4 %   Platelets 295  150 - 400 K/uL  MAGNESIUM      Result Value Range   Magnesium 1.4 (*) 1.5 - 2.5 mg/dL  RENAL FUNCTION PANEL      Result Value Range   Sodium 144  135 - 145 mEq/L   Potassium 5.1  3.5 - 5.1 mEq/L   Chloride 103  96 - 112 mEq/L   CO2 30  19 - 32 mEq/L   Glucose, Bld 206 (*) 70 - 99 mg/dL   BUN 31 (*) 6 - 23 mg/dL   Creatinine, Ser 7.82  0.50 - 1.10 mg/dL   Calcium 8.1 (*) 8.4 - 10.5 mg/dL   Phosphorus 4.4  2.3 - 4.6 mg/dL   Albumin 2.0 (*) 3.5 - 5.2 g/dL   GFR calc non Af Amer >90  >90 mL/min   GFR calc Af Amer >90  >90 mL/min  CBC WITH DIFFERENTIAL      Result Value Range   WBC 17.4 (*) 4.0 - 10.5 K/uL   RBC 3.58 (*) 3.87 - 5.11 MIL/uL   Hemoglobin 11.2 (*) 12.0 - 15.0 g/dL   HCT 95.6 (*) 21.3 - 08.6 %   MCV 95.0  78.0 - 100.0 fL   MCH 31.3  26.0 - 34.0 pg   MCHC 32.9  30.0 - 36.0 g/dL   RDW 57.8 (*) 46.9 - 62.9 %   Platelets 288  150 - 400 K/uL   Neutrophils Relative % 87 (*) 43 - 77 %   Neutro Abs 15.2 (*) 1.7 - 7.7 K/uL   Lymphocytes Relative 8 (*) 12 - 46 %   Lymphs Abs 1.3  0.7 - 4.0 K/uL   Monocytes Relative 5  3 - 12 %   Monocytes Absolute 0.8  0.1 - 1.0 K/uL   Eosinophils Relative 0  0 - 5 %   Eosinophils Absolute 0.0  0.0 - 0.7 K/uL   Basophils Relative 0  0 - 1 %   Basophils Absolute 0.0  0.0 - 0.1 K/uL  MAGNESIUM      Result Value Range   Magnesium 1.7  1.5 - 2.5 mg/dL  URINALYSIS, ROUTINE W REFLEX MICROSCOPIC      Result Value Range   Color, Urine YELLOW  YELLOW   APPearance CLOUDY (*) CLEAR   Specific Gravity, Urine 1.015  1.005 - 1.030   pH 6.5  5.0 - 8.0   Glucose, UA NEGATIVE  NEGATIVE mg/dL   Hgb urine dipstick NEGATIVE  NEGATIVE   Bilirubin Urine NEGATIVE  NEGATIVE   Ketones, ur NEGATIVE  NEGATIVE mg/dL   Protein, ur NEGATIVE  NEGATIVE mg/dL    Urobilinogen, UA 0.2  0.0 - 1.0 mg/dL   Nitrite NEGATIVE  NEGATIVE   Leukocytes, UA TRACE (*) NEGATIVE  LACTIC ACID, PLASMA      Result Value Range   Lactic Acid, Venous 2.0  0.5 - 2.2 mmol/L  URINE MICROSCOPIC-ADD ON      Result Value Range   Squamous Epithelial / LPF RARE  RARE   WBC, UA 3-6  <3 WBC/hpf   RBC / HPF 0-2  <3 RBC/hpf   Bacteria, UA  MANY (*) RARE   Crystals CA OXALATE CRYSTALS (*) NEGATIVE  CBC WITH DIFFERENTIAL      Result Value Range   WBC 12.7 (*) 4.0 - 10.5 K/uL   RBC 3.36 (*) 3.87 - 5.11 MIL/uL   Hemoglobin 10.6 (*) 12.0 - 15.0 g/dL   HCT 16.1 (*) 09.6 - 04.5 %   MCV 97.0  78.0 - 100.0 fL   MCH 31.5  26.0 - 34.0 pg   MCHC 32.5  30.0 - 36.0 g/dL   RDW 40.9 (*) 81.1 - 91.4 %   Platelets 254  150 - 400 K/uL   Neutrophils Relative % 80 (*) 43 - 77 %   Neutro Abs 10.2 (*) 1.7 - 7.7 K/uL   Lymphocytes Relative 14  12 - 46 %   Lymphs Abs 1.8  0.7 - 4.0 K/uL   Monocytes Relative 6  3 - 12 %   Monocytes Absolute 0.7  0.1 - 1.0 K/uL   Eosinophils Relative 0  0 - 5 %   Eosinophils Absolute 0.0  0.0 - 0.7 K/uL   Basophils Relative 0  0 - 1 %   Basophils Absolute 0.0  0.0 - 0.1 K/uL  BASIC METABOLIC PANEL      Result Value Range   Sodium 146 (*) 135 - 145 mEq/L   Potassium 3.7  3.5 - 5.1 mEq/L   Chloride 104  96 - 112 mEq/L   CO2 37 (*) 19 - 32 mEq/L   Glucose, Bld 159 (*) 70 - 99 mg/dL   BUN 33 (*) 6 - 23 mg/dL   Creatinine, Ser 7.82  0.50 - 1.10 mg/dL   Calcium 7.9 (*) 8.4 - 10.5 mg/dL   GFR calc non Af Amer >90  >90 mL/min   GFR calc Af Amer >90  >90 mL/min  MAGNESIUM      Result Value Range   Magnesium 1.4 (*) 1.5 - 2.5 mg/dL  BASIC METABOLIC PANEL      Result Value Range   Sodium 144  135 - 145 mEq/L   Potassium 3.3 (*) 3.5 - 5.1 mEq/L   Chloride 104  96 - 112 mEq/L   CO2 37 (*) 19 - 32 mEq/L   Glucose, Bld 180 (*) 70 - 99 mg/dL   BUN 36 (*) 6 - 23 mg/dL   Creatinine, Ser 9.56  0.50 - 1.10 mg/dL   Calcium 7.6 (*) 8.4 - 10.5 mg/dL   GFR calc non  Af Amer >90  >90 mL/min   GFR calc Af Amer >90  >90 mL/min  CBC WITH DIFFERENTIAL      Result Value Range   WBC 11.0 (*) 4.0 - 10.5 K/uL   RBC 3.22 (*) 3.87 - 5.11 MIL/uL   Hemoglobin 10.3 (*) 12.0 - 15.0 g/dL   HCT 21.3 (*) 08.6 - 57.8 %   MCV 100.9 (*) 78.0 - 100.0 fL   MCH 32.0  26.0 - 34.0 pg   MCHC 31.7  30.0 - 36.0 g/dL   RDW 46.9 (*) 62.9 - 52.8 %   Platelets 242  150 - 400 K/uL   Neutrophils Relative % 79 (*) 43 - 77 %   Neutro Abs 8.6 (*) 1.7 - 7.7 K/uL   Lymphocytes Relative 15  12 - 46 %   Lymphs Abs 1.6  0.7 - 4.0 K/uL   Monocytes Relative 7  3 - 12 %   Monocytes Absolute 0.7  0.1 - 1.0 K/uL   Eosinophils Relative 0  0 -  5 %   Eosinophils Absolute 0.0  0.0 - 0.7 K/uL   Basophils Relative 0  0 - 1 %   Basophils Absolute 0.0  0.0 - 0.1 K/uL  BLOOD GAS, ARTERIAL      Result Value Range   FIO2 1.00     Delivery systems VENTILATOR     Mode ASSIST CONTROL     VT 450     Rate 20     Peep/cpap 5.0     pH, Arterial 7.366  7.350 - 7.450   pCO2 arterial 60.2 (*) 35.0 - 45.0 mmHg   pO2, Arterial 90.8  80.0 - 100.0 mmHg   Bicarbonate 33.7 (*) 20.0 - 24.0 mEq/L   TCO2 35.5  0 - 100 mmol/L   Acid-Base Excess 8.3 (*) 0.0 - 2.0 mmol/L   O2 Saturation 96.5     Patient temperature 98.6     Collection site LEFT RADIAL     Drawn by COLLECTED BY RT     Sample type ARTERIAL DRAW     Allens test (pass/fail) PASS  PASS  BLOOD GAS, ARTERIAL      Result Value Range   FIO2 1.00     Delivery systems VENTILATOR     Mode ASSIST CONTROL     VT 450     Rate 20     Peep/cpap 5.0     pH, Arterial 7.390  7.350 - 7.450   pCO2 arterial 53.8 (*) 35.0 - 45.0 mmHg   pO2, Arterial 181.0 (*) 80.0 - 100.0 mmHg   Bicarbonate 31.9 (*) 20.0 - 24.0 mEq/L   TCO2 33.5  0 - 100 mmol/L   Acid-Base Excess 6.9 (*) 0.0 - 2.0 mmol/L   O2 Saturation 100.0     Patient temperature 98.6     Collection site LEFT RADIAL     Drawn by COLLECTED BY RT     Sample type ARTERIAL DRAW     Allens test  (pass/fail) PASS  PASS  CBC WITH DIFFERENTIAL      Result Value Range   WBC 16.9 (*) 4.0 - 10.5 K/uL   RBC 3.50 (*) 3.87 - 5.11 MIL/uL   Hemoglobin 11.3 (*) 12.0 - 15.0 g/dL   HCT 16.1 (*) 09.6 - 04.5 %   MCV 100.0  78.0 - 100.0 fL   MCH 32.3  26.0 - 34.0 pg   MCHC 32.3  30.0 - 36.0 g/dL   RDW 40.9 (*) 81.1 - 91.4 %   Platelets 262  150 - 400 K/uL   Neutrophils Relative % 83 (*) 43 - 77 %   Lymphocytes Relative 11 (*) 12 - 46 %   Monocytes Relative 6  3 - 12 %   Eosinophils Relative 0  0 - 5 %   Basophils Relative 0  0 - 1 %   Neutro Abs 14.0 (*) 1.7 - 7.7 K/uL   Lymphs Abs 1.9  0.7 - 4.0 K/uL   Monocytes Absolute 1.0  0.1 - 1.0 K/uL   Eosinophils Absolute 0.0  0.0 - 0.7 K/uL   Basophils Absolute 0.0  0.0 - 0.1 K/uL   WBC Morphology WHITE COUNT CONFIRMED ON SMEAR    LACTIC ACID, PLASMA      Result Value Range   Lactic Acid, Venous 2.0  0.5 - 2.2 mmol/L  CBC WITH DIFFERENTIAL      Result Value Range   WBC 6.8  4.0 - 10.5 K/uL   RBC 2.70 (*) 3.87 - 5.11 MIL/uL  Hemoglobin 8.6 (*) 12.0 - 15.0 g/dL   HCT 16.1 (*) 09.6 - 04.5 %   MCV 98.5  78.0 - 100.0 fL   MCH 31.9  26.0 - 34.0 pg   MCHC 32.3  30.0 - 36.0 g/dL   RDW 40.9 (*) 81.1 - 91.4 %   Platelets 147 (*) 150 - 400 K/uL   Neutrophils Relative % 73  43 - 77 %   Neutro Abs 4.9  1.7 - 7.7 K/uL   Lymphocytes Relative 20  12 - 46 %   Lymphs Abs 1.4  0.7 - 4.0 K/uL   Monocytes Relative 7  3 - 12 %   Monocytes Absolute 0.5  0.1 - 1.0 K/uL   Eosinophils Relative 0  0 - 5 %   Eosinophils Absolute 0.0  0.0 - 0.7 K/uL   Basophils Relative 0  0 - 1 %   Basophils Absolute 0.0  0.0 - 0.1 K/uL  CBC      Result Value Range   WBC 7.5  4.0 - 10.5 K/uL   RBC 2.92 (*) 3.87 - 5.11 MIL/uL   Hemoglobin 9.4 (*) 12.0 - 15.0 g/dL   HCT 78.2 (*) 95.6 - 21.3 %   MCV 98.6  78.0 - 100.0 fL   MCH 32.2  26.0 - 34.0 pg   MCHC 32.6  30.0 - 36.0 g/dL   RDW 08.6 (*) 57.8 - 46.9 %   Platelets 179  150 - 400 K/uL  COMPREHENSIVE METABOLIC PANEL       Result Value Range   Sodium 138  135 - 145 mEq/L   Potassium 2.8 (*) 3.5 - 5.1 mEq/L   Chloride 101  96 - 112 mEq/L   CO2 27  19 - 32 mEq/L   Glucose, Bld 237 (*) 70 - 99 mg/dL   BUN 30 (*) 6 - 23 mg/dL   Creatinine, Ser 6.29  0.50 - 1.10 mg/dL   Calcium 7.3 (*) 8.4 - 10.5 mg/dL   Total Protein 4.5 (*) 6.0 - 8.3 g/dL   Albumin 1.6 (*) 3.5 - 5.2 g/dL   AST 47 (*) 0 - 37 U/L   ALT 21  0 - 35 U/L   Alkaline Phosphatase 210 (*) 39 - 117 U/L   Total Bilirubin 0.5  0.3 - 1.2 mg/dL   GFR calc non Af Amer >90  >90 mL/min   GFR calc Af Amer >90  >90 mL/min  BLOOD GAS, ARTERIAL      Result Value Range   FIO2 0.35     Delivery systems VENTILATOR     Mode ASSIST CONTROL     VT 450     Rate 20     Peep/cpap 5.0     pH, Arterial 7.526 (*) 7.350 - 7.450   pCO2 arterial 34.8 (*) 35.0 - 45.0 mmHg   pO2, Arterial 113.0 (*) 80.0 - 100.0 mmHg   Bicarbonate 28.7 (*) 20.0 - 24.0 mEq/L   TCO2 29.8  0 - 100 mmol/L   Acid-Base Excess 5.7 (*) 0.0 - 2.0 mmol/L   O2 Saturation 100.0     Patient temperature 98.6     Collection site RIGHT RADIAL     Sample type ARTERIAL DRAW     Allens test (pass/fail) PASS  PASS  CBC      Result Value Range   WBC 5.5  4.0 - 10.5 K/uL   RBC 2.78 (*) 3.87 - 5.11 MIL/uL   Hemoglobin 8.8 (*) 12.0 - 15.0 g/dL  HCT 27.6 (*) 36.0 - 46.0 %   MCV 99.3  78.0 - 100.0 fL   MCH 31.7  26.0 - 34.0 pg   MCHC 31.9  30.0 - 36.0 g/dL   RDW 16.1 (*) 09.6 - 04.5 %   Platelets 186  150 - 400 K/uL  COMPREHENSIVE METABOLIC PANEL      Result Value Range   Sodium 140  135 - 145 mEq/L   Potassium 3.1 (*) 3.5 - 5.1 mEq/L   Chloride 104  96 - 112 mEq/L   CO2 28  19 - 32 mEq/L   Glucose, Bld 153 (*) 70 - 99 mg/dL   BUN 24 (*) 6 - 23 mg/dL   Creatinine, Ser 4.09 (*) 0.50 - 1.10 mg/dL   Calcium 7.3 (*) 8.4 - 10.5 mg/dL   Total Protein 4.3 (*) 6.0 - 8.3 g/dL   Albumin 1.5 (*) 3.5 - 5.2 g/dL   AST 50 (*) 0 - 37 U/L   ALT 20  0 - 35 U/L   Alkaline Phosphatase 208 (*) 39 - 117 U/L    Total Bilirubin 0.4  0.3 - 1.2 mg/dL   GFR calc non Af Amer >90  >90 mL/min   GFR calc Af Amer >90  >90 mL/min  MAGNESIUM      Result Value Range   Magnesium 1.2 (*) 1.5 - 2.5 mg/dL  BASIC METABOLIC PANEL      Result Value Range   Sodium 140  135 - 145 mEq/L   Potassium 3.8  3.5 - 5.1 mEq/L   Chloride 104  96 - 112 mEq/L   CO2 29  19 - 32 mEq/L   Glucose, Bld 132 (*) 70 - 99 mg/dL   BUN 20  6 - 23 mg/dL   Creatinine, Ser 8.11 (*) 0.50 - 1.10 mg/dL   Calcium 7.1 (*) 8.4 - 10.5 mg/dL   GFR calc non Af Amer >90  >90 mL/min   GFR calc Af Amer >90  >90 mL/min  PROCALCITONIN      Result Value Range   Procalcitonin <0.10    COMPREHENSIVE METABOLIC PANEL      Result Value Range   Sodium 142  135 - 145 mEq/L   Potassium 4.1  3.5 - 5.1 mEq/L   Chloride 104  96 - 112 mEq/L   CO2 27  19 - 32 mEq/L   Glucose, Bld 234 (*) 70 - 99 mg/dL   BUN 38 (*) 6 - 23 mg/dL   Creatinine, Ser 9.14  0.50 - 1.10 mg/dL   Calcium 7.3 (*) 8.4 - 10.5 mg/dL   Total Protein 4.3 (*) 6.0 - 8.3 g/dL   Albumin 1.5 (*) 3.5 - 5.2 g/dL   AST 41 (*) 0 - 37 U/L   ALT 18  0 - 35 U/L   Alkaline Phosphatase 158 (*) 39 - 117 U/L   Total Bilirubin 0.4  0.3 - 1.2 mg/dL   GFR calc non Af Amer >90  >90 mL/min   GFR calc Af Amer >90  >90 mL/min  CBC      Result Value Range   WBC 4.9  4.0 - 10.5 K/uL   RBC 2.90 (*) 3.87 - 5.11 MIL/uL   Hemoglobin 9.2 (*) 12.0 - 15.0 g/dL   HCT 78.2 (*) 95.6 - 21.3 %   MCV 100.3 (*) 78.0 - 100.0 fL   MCH 31.7  26.0 - 34.0 pg   MCHC 31.6  30.0 - 36.0 g/dL   RDW 08.6 (*)  11.5 - 15.5 %   Platelets 241  150 - 400 K/uL  BASIC METABOLIC PANEL      Result Value Range   Sodium 141  135 - 145 mEq/L   Potassium 3.3 (*) 3.5 - 5.1 mEq/L   Chloride 107  96 - 112 mEq/L   CO2 26  19 - 32 mEq/L   Glucose, Bld 153 (*) 70 - 99 mg/dL   BUN 20  6 - 23 mg/dL   Creatinine, Ser 5.62 (*) 0.50 - 1.10 mg/dL   Calcium 6.9 (*) 8.4 - 10.5 mg/dL   GFR calc non Af Amer >90  >90 mL/min   GFR calc Af Amer  >90  >90 mL/min  PRO B NATRIURETIC PEPTIDE      Result Value Range   Pro B Natriuretic peptide (BNP) 2208.0 (*) 0 - 125 pg/mL  MAGNESIUM      Result Value Range   Magnesium 1.5  1.5 - 2.5 mg/dL  DIGOXIN LEVEL      Result Value Range   Digoxin Level 0.7 (*) 0.8 - 2.0 ng/mL  COMPREHENSIVE METABOLIC PANEL      Result Value Range   Sodium 141  135 - 145 mEq/L   Potassium 4.4  3.5 - 5.1 mEq/L   Chloride 106  96 - 112 mEq/L   CO2 30  19 - 32 mEq/L   Glucose, Bld 132 (*) 70 - 99 mg/dL   BUN 21  6 - 23 mg/dL   Creatinine, Ser 1.30 (*) 0.50 - 1.10 mg/dL   Calcium 8.0 (*) 8.4 - 10.5 mg/dL   Total Protein 4.7 (*) 6.0 - 8.3 g/dL   Albumin 1.6 (*) 3.5 - 5.2 g/dL   AST 89 (*) 0 - 37 U/L   ALT 35  0 - 35 U/L   Alkaline Phosphatase 260 (*) 39 - 117 U/L   Total Bilirubin 0.3  0.3 - 1.2 mg/dL   GFR calc non Af Amer >90  >90 mL/min   GFR calc Af Amer >90  >90 mL/min  BODY FLUID CELL COUNT WITH DIFFERENTIAL      Result Value Range   Fluid Type-FCT PLEURAL     Color, Fluid YELLOW  YELLOW   Appearance, Fluid CLEAR  CLEAR   WBC, Fluid 232  0 - 1000 cu mm   Neutrophil Count, Fluid 23  0 - 25 %   Lymphs, Fluid 48     Monocyte-Macrophage-Serous Fluid 29 (*) 50 - 90 %   Eos, Fluid 0     Other Cells, Fluid PLASMACYTOID LYMPHS    AMYLASE, BODY FLUID      Result Value Range   Amylase, Fluid 13     Fluid Type-FAMY PLEURAL    LACTATE DEHYDROGENASE, BODY FLUID      Result Value Range   LD, Fluid 182 (*) 3 - 23 U/L   Fluid Type-FLDH PLEURAL    PROTEIN, BODY FLUID      Result Value Range   Total protein, fluid 1.0     Fluid Type-FTP PLEURAL    PATHOLOGIST SMEAR REVIEW      Result Value Range   Path Review ATYPICAL CELLS PRESENT.    CBC      Result Value Range   WBC 6.5  4.0 - 10.5 K/uL   RBC 2.88 (*) 3.87 - 5.11 MIL/uL   Hemoglobin 9.3 (*) 12.0 - 15.0 g/dL   HCT 86.5 (*) 78.4 - 69.6 %   MCV 100.3 (*) 78.0 - 100.0  fL   MCH 32.3  26.0 - 34.0 pg   MCHC 32.2  30.0 - 36.0 g/dL   RDW 16.1 (*)  09.6 - 15.5 %   Platelets 325  150 - 400 K/uL  BASIC METABOLIC PANEL      Result Value Range   Sodium 138  135 - 145 mEq/L   Potassium 4.7  3.5 - 5.1 mEq/L   Chloride 103  96 - 112 mEq/L   CO2 28  19 - 32 mEq/L   Glucose, Bld 140 (*) 70 - 99 mg/dL   BUN 20  6 - 23 mg/dL   Creatinine, Ser 0.45 (*) 0.50 - 1.10 mg/dL   Calcium 8.2 (*) 8.4 - 10.5 mg/dL   GFR calc non Af Amer >90  >90 mL/min   GFR calc Af Amer >90  >90 mL/min  PROTIME-INR      Result Value Range   Prothrombin Time 13.4  11.6 - 15.2 seconds   INR 1.03  0.00 - 1.49  BLOOD GAS, ARTERIAL      Result Value Range   FIO2 0.28     Delivery systems TRACH COLLAR/TRACH TUBE     pH, Arterial 7.461 (*) 7.350 - 7.450   pCO2 arterial 35.7  35.0 - 45.0 mmHg   pO2, Arterial 105.0 (*) 80.0 - 100.0 mmHg   Bicarbonate 25.1 (*) 20.0 - 24.0 mEq/L   TCO2 26.2  0 - 100 mmol/L   Acid-Base Excess 1.6  0.0 - 2.0 mmol/L   O2 Saturation 99.9     Patient temperature 98.6     Collection site RIGHT RADIAL     Sample type ARTERIAL DRAW     Allens test (pass/fail) PASS  PASS  CBC      Result Value Range   WBC 9.6  4.0 - 10.5 K/uL   RBC 2.96 (*) 3.87 - 5.11 MIL/uL   Hemoglobin 9.6 (*) 12.0 - 15.0 g/dL   HCT 40.9 (*) 81.1 - 91.4 %   MCV 97.3  78.0 - 100.0 fL   MCH 32.4  26.0 - 34.0 pg   MCHC 33.3  30.0 - 36.0 g/dL   RDW 78.2 (*) 95.6 - 21.3 %   Platelets 291  150 - 400 K/uL  BASIC METABOLIC PANEL      Result Value Range   Sodium 131 (*) 135 - 145 mEq/L   Potassium 4.4  3.5 - 5.1 mEq/L   Chloride 101  96 - 112 mEq/L   CO2 26  19 - 32 mEq/L   Glucose, Bld 143 (*) 70 - 99 mg/dL   BUN 19  6 - 23 mg/dL   Creatinine, Ser 0.86 (*) 0.50 - 1.10 mg/dL   Calcium 8.0 (*) 8.4 - 10.5 mg/dL   GFR calc non Af Amer >90  >90 mL/min   GFR calc Af Amer >90  >90 mL/min  CBC      Result Value Range   WBC 10.6 (*) 4.0 - 10.5 K/uL   RBC 2.91 (*) 3.87 - 5.11 MIL/uL   Hemoglobin 9.6 (*) 12.0 - 15.0 g/dL   HCT 57.8 (*) 46.9 - 62.9 %   MCV 98.3  78.0 -  100.0 fL   MCH 33.0  26.0 - 34.0 pg   MCHC 33.6  30.0 - 36.0 g/dL   RDW 52.8 (*) 41.3 - 24.4 %   Platelets 289  150 - 400 K/uL  APTT      Result Value Range   aPTT 34  24 - 37 seconds  PROTIME-INR      Result Value Range   Prothrombin Time 13.1  11.6 - 15.2 seconds   INR 1.00  0.00 - 1.49  PROTIME-INR      Result Value Range   Prothrombin Time 13.7  11.6 - 15.2 seconds   INR 1.06  0.00 - 1.49  TYPE AND SCREEN      Result Value Range   ABO/RH(D) O POS     Antibody Screen NEG     Sample Expiration 03/20/2013     Unit Number [id number]     Blood Component Type RED CELLS,LR     Unit division 00     Status of Unit REL FROM [facility]     Transfusion Status OK TO TRANSFUSE     Crossmatch Result Compatible     Unit Number [id number]     Blood Component Type RED CELLS,LR     Unit division 00     Status of Unit REL FROM [facility]     Transfusion Status OK TO TRANSFUSE     Crossmatch Result Compatible    PREPARE RBC (CROSSMATCH)      Result Value Range   Order Confirmation ORDER PROCESSED BY BLOOD BANK    TYPE AND SCREEN      Result Value Range   ABO/RH(D) O POS     Antibody Screen NEG     Sample Expiration 03/24/2013     Unit Number [id number]     Blood Component Type RED CELLS,LR     Unit division 00     Status of Unit ISSUED,FINAL     Transfusion Status OK TO TRANSFUSE     Crossmatch Result Compatible     Unit Number [id number]     Blood Component Type RED CELLS,LR     Unit division 00     Status of Unit ISSUED,FINAL     Transfusion Status OK TO TRANSFUSE     Crossmatch Result Compatible    PREPARE RBC (CROSSMATCH)      Result Value Range   Order Confirmation ORDER PROCESSED BY BLOOD BANK     A/P: Pt with hx septic shock/VDRF with trach placement, encephalopathy, AKI , recently diagnosed RLE DVT and protein calorie malnutrition. Tent plan is for placement of a percutaneous gastrostomy tube on 6/6. Pt was given small dose of coumadin on 6/4 (2.5 mg) for DVT and is  currently on lovenox as well. PT/INR nl 6/5. Will plan to hold coumadin starting today and hold lovenox tomorrow, check am PT/INR and if nl and KUB nl will plan to proceed with case. Details/risks of above d/w pt's daughter with her understanding and consent. Dr. Stanton Kidney aware of plans.

## 2013-04-07 NOTE — Progress Notes (Signed)
PULMONARY  / CRITICAL CARE MEDICINE  Name: Krista [name] MRN: [id number] DOB: 1956/10/30    ADMISSION DATE:  03/16/13 [facility] CONSULTATION DATE:  03/25/13  REFERRING MD :  March Rummage PRIMARY SERVICE: [facility] MD  CHIEF COMPLAINT:  Vent mgmt in select   BRIEF PATIENT DESCRIPTION: 57 y/o female with recent c.diff and schizophrenia was brought in to [facility] ED on 5/7 by EMS after her family said she had been unresponsive for 24 hours.  She was in septic shock, AKI, and had diarrhea and a UTI. To select , decline >  PNA, collapse, ETT  SIGNIFICANT EVENTS / STUDIES:  5/7 CT head >>neg acute 5/7 CT neck >>neg acute 5/8 RUQ U/S >>chronic medical renal disease, Trace bilateral perinephric fluid 5/8 CT abdo/pelvis>>>Increased abdominal ascites and mesenteric edema. Inflammatory changes surround the pancreas. Question pancreatitis. New bilateral pleural effusions, right greater than left, with near total collapse of the right lower lobe. Multiple thoracolumbar compression fractures are stable. 5/8 Ct >>>neg edema, acute 5/14 to select 5/23- emergent intubation, pus all lobes and collapse, emergent bronch 5-23 trach (DF) 5-23 pressor dependent, cvp 4, on diuretics 6/4 off pressors, tol ATC wean   [name] / TUBES: 5/7 ETT (EDP) >>5/12 5/7 L [place] CVL (EDP) >>5/8  5/8 rt IJ>>>out 5/23 ETT>>>5-23 5-23 trach(DF)>>  CULTURES: 5/7 blood >>>enterobacter S to cipro 5/7 urine >>enterobacter S to cipro 5/7 c.diff >>POS 5/23 Bronch BAL>>> normal flora 5/21 BC>>>NGTD>>neg 5-21uc >VRE 5/30 pleural fluid>>>neg   ANTIBIOTICS: Per ssh  SUBJECTIVE/overnight:  Tol ATC, some increased WOB over weekend so weaning on PS, tol well.  AwAke and alert, tearful, wants to go home.    VITAL SIGNS:  Vital signs reviewed. Abnormal values will appear under impression plan section. Off all pressors.     HEMODYNAMICS:   VENTILATOR SETTINGS:   INTAKE / OUTPUT: Intake/Output   None     PHYSICAL EXAMINATION:  Gen:  chronically ill, no distress HEENT: no jvd, obese, trach cdi PULM: resps even non labored on PS10/5, Rhonchi bilaterally, decreased on left.  CV: s1 s2 rrr AB: BS increased, nontender, no hsm Ext: warm, increased edema 2 plus Neuro:  Awake, alert, follows commands, nods appropriately, gen weakness   LABS:  Recent Labs Lab 04/01/13 0350 04/03/13 0529 04/04/13 1305 04/06/13 0440 04/06/13 1427 04/07/13 0530  HGB  --  9.3*  --  9.6* 9.6*  --   WBC  --  6.5  --  9.6 10.6*  --   PLT  --  325  --  291 289  --   NA 141 138  --  131*  --   --   K 4.4 4.7  --  4.4  --   --   CL 106 103  --  101  --   --   CO2 30 28  --  26  --   --   GLUCOSE 132* 140*  --  143*  --   --   BUN 21 20  --  19  --   --   CREATININE 0.42* 0.47*  --  0.39*  --   --   CALCIUM 8.0* 8.2*  --  8.0*  --   --   AST 89*  --   --   --   --   --   ALT 35  --   --   --   --   --   ALKPHOS 260*  --   --   --   --   --  BILITOT 0.3  --   --   --   --   --   PROT 4.7*  --   --   --   --   --   ALBUMIN 1.6*  --   --   --   --   --   APTT  --   --   --   --  34  --   INR  --  1.03  --   --  1.00 1.06  PHART  --   --  7.461*  --   --   --   PCO2ART  --   --  35.7  --   --   --   PO2ART  --   --  105.0*  --   --   --    No results found for this basename: GLUCAP,  in the last 168 hours  Imaging: Dg Chest Port 1 View  04/07/2013   *RADIOLOGY REPORT*  Clinical Data: Respiratory failure.  PORTABLE CHEST - 1 VIEW  Comparison: 04/06/2013  Findings: Support devices are in stable position.  Improving aeration in the left lung.  Small left pleural effusion with continued left lower lobe atelectasis or infiltrate.  Right lung is clear.  IMPRESSION: Improving aeration in the left lung.  Continued left lower lobe atelectasis or infiltrate and small left effusion.   Original Report Authenticated By: Charlett Nose, M.D.   Dg Chest Port 1 View  04/06/2013   *RADIOLOGY REPORT*  Clinical Data: Respiratory failure, tracheostomy  PORTABLE  CHEST - 1 VIEW  Comparison: Portable chest x-ray of 04/04/2013 and CT angio chest of 04/05/2013  Findings: Compared to the chest x-ray of 06/02, there is slightly more opacity in the left hemithorax which by CT represents collapse of the left partial collapse of left lung and left pleural effusion.  Tracheostomy and left central venous line are unchanged in position and an NG tube remains.  The right lung is clear.  IMPRESSION: Little change to slightly more opacity within the left hemithorax, as noted above, most consistent with atelectasis and left effusion.   Original Report Authenticated By: Dwyane Dee, M.D.   Dg Abd Portable 1v  04/07/2013   *RADIOLOGY REPORT*  Clinical Data: 57 year old female NG tube placement.  PORTABLE ABDOMEN - 1 VIEW  Comparison: 04/06/2013 and earlier.  Findings: Portable supine AP view 0637 hours.  Enteric tube and stable position, tip at the level of the distal stomach.  The stomach is less distended.  Mildly increased small bowel gas in the abdomen, no dilated small bowel loops.  Large bowel gas again seen to the level of the descending colon.  Interval improved ventilation at the left lung base.  IMPRESSION: Stable enteric tube, tip at the level of the distal stomach.   Original Report Authenticated By: Erskine Speed, M.D.   Dg Abd Portable 1v  04/06/2013   *RADIOLOGY REPORT*  Clinical Data: NG tube placement.  PORTABLE ABDOMEN - 1 VIEW  Comparison: 04/01/2013  Findings: NG tube tip is in the distal stomach.  Gas within the stomach, large and small bowel.  No evidence of bowel obstruction. Findings may reflect mild ileus.  IMPRESSION: NG tube tip in the distal stomach.   Original Report Authenticated By: Charlett Nose, M.D.   Agree/ worsening Left lung atx   ASSESSMENT / PLAN:   Acute respiratory failure s/p trach--  Multifactorial r/t AMS, sepsis, HCAP.  Trach 5-23  left lung collapse/ ATX/effusion - s/p FOB 6/4 (  L main obstructed with thick secretions) Transudative effusion  5/30-- much improved aeration post FOB for mucous plugging, still small effusionwith studies NOT c/w empyema but CXR seems to be improving.  Previous thora with only 120cc out and culture negative.  P:   F/u CXR in am  Avoid trach collar , use peep 8 overnight to 10  Will decide on bronch in am after pcxr review No role thora repeat  May need repeat CT chest after aeration successful with bronch  NSTEMI> demand ischemia Echo - nml LV fn P:  - off pressors  - cont gentle diuresis for neg balance as tol   RLE DVT P: Per # 1 service   PRIOR Gram neg Septic shock due to UTI and c.diff and cellulitis/skin breakdown on buttocks.   New VRE UTI 5/21 New HCAP 5/23 P:   -abx per primary   Code status: full  Mcarthur Rossetti. Tyson Alias, MD, FACP Pgr: [phone] [facility]

## 2013-04-08 ENCOUNTER — Other Ambulatory Visit (HOSPITAL_COMMUNITY): Payer: Self-pay

## 2013-04-08 HISTORY — PX: GASTROSTOMY TUBE PLACEMENT: SHX655

## 2013-04-08 LAB — PROTIME-INR: Prothrombin Time: 13.3 seconds (ref 11.6–15.2)

## 2013-04-08 LAB — CBC
HCT: 28.1 % — ABNORMAL LOW (ref 36.0–46.0)
MCHC: 33.5 g/dL (ref 30.0–36.0)
MCV: 98.9 fL (ref 78.0–100.0)
RDW: 18.9 % — ABNORMAL HIGH (ref 11.5–15.5)

## 2013-04-08 LAB — BASIC METABOLIC PANEL
BUN: 20 mg/dL (ref 6–23)
Calcium: 8.5 mg/dL (ref 8.4–10.5)
Creatinine, Ser: 0.34 mg/dL — ABNORMAL LOW (ref 0.50–1.10)
GFR calc Af Amer: >90 mL/min (ref >90)
GFR calc non Af Amer: >90 mL/min (ref >90)

## 2013-04-08 MED ORDER — GELATIN ABSORBABLE 12-7 MM EX MISC
CUTANEOUS | Status: AC
  Filled 2013-04-08: qty 1

## 2013-04-08 MED ORDER — IOHEXOL 300 MG/ML  SOLN
50.0000 mL | Freq: Once | INTRAMUSCULAR | Status: AC | PRN
  Administered 2013-04-08: 15 mL via INTRAVENOUS

## 2013-04-08 MED ORDER — SODIUM CHLORIDE 0.9 % IV SOLN
INTRAVENOUS | Status: AC | PRN
  Administered 2013-04-08: 75 mL/h via INTRAVENOUS

## 2013-04-08 MED ORDER — FENTANYL CITRATE 0.05 MG/ML IJ SOLN
INTRAMUSCULAR | Status: AC | PRN
  Administered 2013-04-08 (×2): 50 ug via INTRAVENOUS

## 2013-04-08 MED ORDER — MIDAZOLAM HCL 2 MG/2ML IJ SOLN
INTRAMUSCULAR | Status: AC | PRN
  Administered 2013-04-08 (×2): 2 mg via INTRAVENOUS

## 2013-04-08 NOTE — Procedures (Signed)
Interventional Radiology Procedure Note  Procedure: Placement of percutaneous 20F pull-through gastrostomy tube. Complications: None Recommendations: - NPO except for sips and chips remainder of today and overnight - Maintain G-tube to LWS until tomorrow morning  - May advance diet as tolerated and begin using tube tomorrow at noon.  Signed,  Vilas Edgerly K. Macrina Lehnert, MD Vascular & Interventional Radiologist [facility]  

## 2013-04-08 NOTE — Progress Notes (Signed)
PULMONARY  / CRITICAL CARE MEDICINE  Name: Krista [name] MRN: [id number] DOB: 08-04-1956    ADMISSION DATE:  03/16/13 select CONSULTATION DATE:  03/25/13  REFERRING MD :  March Rummage PRIMARY SERVICE: Select MD  CHIEF COMPLAINT:  Vent mgmt in select   BRIEF PATIENT DESCRIPTION: 57 y/o female with recent c.diff and schizophrenia was brought in to [facility] ED on 5/7 by EMS after her family said she had been unresponsive for 24 hours.  She was in septic shock, AKI, and had diarrhea and a UTI. To select , decline >  PNA, collapse, ETT  SIGNIFICANT EVENTS / STUDIES:  5/7 CT head >>neg acute 5/7 CT neck >>neg acute 5/8 RUQ U/S >>chronic medical renal disease, Trace bilateral perinephric fluid 5/8 CT abdo/pelvis>>>Increased abdominal ascites and mesenteric edema. Inflammatory changes surround the pancreas. Question pancreatitis. New bilateral pleural effusions, right greater than left, with near total collapse of the right lower lobe. Multiple thoracolumbar compression fractures are stable. 5/8 Ct >>>neg edema, acute 5/14 to select 5/23- emergent intubation, pus all lobes and collapse, emergent bronch 5-23 trach (DF) 5-23 pressor dependent, cvp 4, on diuretics 6/4 off pressors, tol ATC wean  6/6- improved aeration lll  LINES / TUBES: 5/7 ETT (EDP) >>5/12 5/7 L  CVL (EDP) >>5/8  5/8 rt IJ>>>out 5/23 ETT>>>5-23 5-23 trach(DF)>>  CULTURES: 5/7 blood >>>enterobacter S to cipro 5/7 urine >>enterobacter S to cipro 5/7 c.diff >>POS 5/23 Bronch BAL>>> normal flora 5/21 BC>>>NGTD>>neg 5-21uc >VRE 5/30 pleural fluid>>>neg   ANTIBIOTICS: Per ssh  SUBJECTIVE/overnight:  Tol ATC, some increased WOB over weekend so weaning on PS, tol well.  AwAke and alert, tearful, wants to go home.    VITAL SIGNS:  Vital signs reviewed. Abnormal values will appear under impression plan section. Off all pressors.   Pulse Rate:  [70-103] 90 (06/06 1150) Resp:  [16-18] 16 (06/06 1150) BP: (110-146)/(81-100)  130/96 mmHg (06/06 1150) SpO2:  [100 %] 100 % (06/06 1150) HEMODYNAMICS:   VENTILATOR SETTINGS:   INTAKE / OUTPUT: Intake/Output   None     PHYSICAL EXAMINATION:  Gen: chronically ill, no distress HEENT: no jvd, obese, trach cdi PULM:Improved rt base aeration CV: s1 s2 rrr AB: BS increased, nontender, no hsm Ext: warm, increased edema 2 plus Neuro:  Awake, alert, follows commands, nods appropriately, gen weakness   LABS:  Recent Labs Lab 04/03/13 0529 04/04/13 1305 04/06/13 0440 04/06/13 1427 04/07/13 0530 04/08/13 0600  HGB 9.3*  --  9.6* 9.6*  --  9.4*  WBC 6.5  --  9.6 10.6*  --  8.5  PLT 325  --  291 289  --  293  NA 138  --  131*  --   --  134*  K 4.7  --  4.4  --   --  4.1  CL 103  --  101  --   --  100  CO2 28  --  26  --   --  27  GLUCOSE 140*  --  143*  --   --  111*  BUN 20  --  19  --   --  20  CREATININE 0.47*  --  0.39*  --   --  0.34*  CALCIUM 8.2*  --  8.0*  --   --  8.5  APTT  --   --   --  34  --  46*  INR 1.03  --   --  1.00 1.06 1.02  PROBNP  --   --   --   --   --  460.5*  PHART  --  7.461*  --   --   --   --   PCO2ART  --  35.7  --   --   --   --   PO2ART  --  105.0*  --   --   --   --    No results found for this basename: GLUCAP,  in the last 168 hours  Imaging: Dg Chest Port 1 View  04/08/2013   *RADIOLOGY REPORT*  Clinical Data: Airspace disease, follow-up  PORTABLE CHEST - 1 VIEW  Comparison: Portable chest x-ray of 06/05 and 04/06/2013  Findings: There has been further improvement in aeration of the left hemithorax with persistent left basilar opacity.  Right lung is clear.  Cardiomegaly is stable.  Tracheostomy and left central venous line are unchanged.  IMPRESSION: Further improvement in aeration of the left lung.  The right lung remains clear   Original Report Authenticated By: Dwyane Dee, M.D.   Dg Chest Port 1 View  04/07/2013   *RADIOLOGY REPORT*  Clinical Data: Respiratory failure.  PORTABLE CHEST - 1 VIEW  Comparison:  04/06/2013  Findings: Support devices are in stable position.  Improving aeration in the left lung.  Small left pleural effusion with continued left lower lobe atelectasis or infiltrate.  Right lung is clear.  IMPRESSION: Improving aeration in the left lung.  Continued left lower lobe atelectasis or infiltrate and small left effusion.   Original Report Authenticated By: Charlett Nose, M.D.   Dg Abd Portable 1v  04/08/2013   *RADIOLOGY REPORT*  Clinical Data: Evaluate barium.  PORTABLE ABDOMEN - 1 VIEW  Comparison: 04/07/2013.  Findings: Barium is seen within portions of the distal small bowel, ascending colon, descending colon and sigmoid colon.  Small portion of the transverse colon contains barium.  Gas filled proximal small bowel loops measures up to 4 cm. Etiology/significance indeterminate.  Lucency in the right upper quadrant.  Etiology indeterminate.  This may be related to a skin fold.  If there is any clinical suspicion of free intraperitoneal air, left side down right side up decubitus view recommended for further delineation.  Nasogastric tube appears to have been removed.  IMPRESSION: Barium is seen within portions of the distal small bowel, ascending colon, descending colon and sigmoid colon.  Gas filled proximal small bowel loops measures up to 4 cm. Etiology/significance indeterminate.  Lucency in the right upper quadrant.  Etiology indeterminate.  This may be related to a skin fold.  If there is any clinical suspicion of free intraperitoneal air, left side down right side up decubitus view recommended for further delineation.  This is a call report.   Original Report Authenticated By: Lacy Duverney, M.D.   Dg Abd Portable 1v  04/07/2013   *RADIOLOGY REPORT*  Clinical Data: 57 year old female NG tube placement.  PORTABLE ABDOMEN - 1 VIEW  Comparison: 04/06/2013 and earlier.  Findings: Portable supine AP view 0637 hours.  Enteric tube and stable position, tip at the level of the distal stomach.  The  stomach is less distended.  Mildly increased small bowel gas in the abdomen, no dilated small bowel loops.  Large bowel gas again seen to the level of the descending colon.  Interval improved ventilation at the left lung base.  IMPRESSION: Stable enteric tube, tip at the level of the distal stomach.   Original Report Authenticated By: Erskine Speed, M.D.   Dg Abd Portable 1v  04/06/2013   *RADIOLOGY REPORT*  Clinical Data: NG tube placement.  PORTABLE ABDOMEN - 1 VIEW  Comparison: 04/01/2013  Findings: NG tube tip is in the distal stomach.  Gas within the stomach, large and small bowel.  No evidence of bowel obstruction. Findings may reflect mild ileus.  IMPRESSION: NG tube tip in the distal stomach.   Original Report Authenticated By: Charlett Nose, M.D.   Agree/ worsening Left lung atx   ASSESSMENT / PLAN:   Acute respiratory failure s/p trach--  Multifactorial r/t AMS, sepsis, HCAP.  Trach 5-23  left lung collapse/ ATX/effusion - s/p FOB 6/4 (L main obstructed with thick secretions) Transudative effusion 5/30-- much improved aeration post FOB for mucous plugging, still small effusionwith studies NOT c/w empyema but CXR seems to be improving.  Previous thora with only 120cc out and culture negative.  P:   pcxr improved on peep 10 Goal to peep 8 over weekend, re assess to goal 5 by Monday pcxr on Monday Consider PS 10 cpap 10, ok for 2 -4 hrs No role thora, pcxr reveals improved ATX  Code status: full  Mcarthur Rossetti. Tyson Alias, MD, FACP Pgr: [phone] [facility]

## 2013-04-08 NOTE — Progress Notes (Signed)
Agree with PA note.    Signed,  Camerin Jimenez K. Navid Lenzen, MD Vascular & Interventional Radiologist [facility]  

## 2013-04-11 ENCOUNTER — Other Ambulatory Visit (HOSPITAL_COMMUNITY): Payer: Self-pay

## 2013-04-11 LAB — PROTIME-INR: Prothrombin Time: 12.8 seconds (ref 11.6–15.2)

## 2013-04-11 LAB — URINE MICROSCOPIC-ADD ON

## 2013-04-11 LAB — COMPREHENSIVE METABOLIC PANEL
ALT: 29 U/L (ref 0–35)
Alkaline Phosphatase: 402 U/L — ABNORMAL HIGH (ref 39–117)
BUN: 15 mg/dL (ref 6–23)
CO2: 27 mEq/L (ref 19–32)
Calcium: 8.5 mg/dL (ref 8.4–10.5)
GFR calc Af Amer: >90 mL/min (ref >90)
GFR calc non Af Amer: >90 mL/min (ref >90)
Glucose, Bld: 150 mg/dL — ABNORMAL HIGH (ref 70–99)
Potassium: 3.8 mEq/L (ref 3.5–5.1)
Sodium: 133 mEq/L — ABNORMAL LOW (ref 135–145)
Total Protein: 5.3 g/dL — ABNORMAL LOW (ref 6.0–8.3)

## 2013-04-11 LAB — CBC
HCT: 26.1 % — ABNORMAL LOW (ref 36.0–46.0)
Hemoglobin: 8.6 g/dL — ABNORMAL LOW (ref 12.0–15.0)
MCH: 32.3 pg (ref 26.0–34.0)
MCHC: 33 g/dL (ref 30.0–36.0)
RBC: 2.66 MIL/uL — ABNORMAL LOW (ref 3.87–5.11)

## 2013-04-11 LAB — URINALYSIS, ROUTINE W REFLEX MICROSCOPIC
Glucose, UA: NEGATIVE mg/dL
Specific Gravity, Urine: 1.011 (ref 1.005–1.030)
pH: 8 (ref 5.0–8.0)

## 2013-04-11 NOTE — Progress Notes (Signed)
PULMONARY  / CRITICAL CARE MEDICINE  Name: Krista [name] MRN: [id number] DOB: 03-Feb-1956    ADMISSION DATE:  03/16/13 select CONSULTATION DATE:  03/25/13  REFERRING MD :  March Rummage PRIMARY SERVICE: Select MD  CHIEF COMPLAINT:  Vent mgmt in select   BRIEF PATIENT DESCRIPTION: 57 y/o female with recent c.diff and schizophrenia was brought in to [facility] ED on 5/7 by EMS after her family said she had been unresponsive for 24 hours.  She was in septic shock, AKI, and had diarrhea and a UTI. To select , decline >  PNA, collapse, ETT  SIGNIFICANT EVENTS / STUDIES:  5/7 CT head >>neg acute 5/7 CT neck >>neg acute 5/8 RUQ U/S >>chronic medical renal disease, Trace bilateral perinephric fluid 5/8 CT abdo/pelvis>>>Increased abdominal ascites and mesenteric edema. Inflammatory changes surround the pancreas. Question pancreatitis. New bilateral pleural effusions, right greater than left, with near total collapse of the right lower lobe. Multiple thoracolumbar compression fractures are stable. 5/8 Ct >>>neg edema, acute 5/14 to select 5/23- emergent intubation, pus all lobes and collapse, emergent bronch 5-23 trach (DF) 5-23 pressor dependent, cvp 4, on diuretics 6/4 off pressors, tol ATC wean  6/6- improved aeration lll  LINES / TUBES: 5/7 ETT (EDP) >>5/12 5/7 L [place] CVL (EDP) >>5/8  5/8 rt IJ>>>out 5/23 ETT>>>5-23 5-23 trach(DF)>>  CULTURES: 5/7 blood >>>enterobacter S to cipro 5/7 urine >>enterobacter S to cipro 5/7 c.diff >>POS 5/23 Bronch BAL>>> normal flora 5/21 BC>>>NGTD>>neg 5-21uc >VRE 5/30 pleural fluid>>>neg   ANTIBIOTICS: Per ssh  SUBJECTIVE/overnight:  More alert, failed wean over weekend r/t apnea, tol PS this am.    VITAL SIGNS:  Vital signs reviewed.  PHYSICAL EXAMINATION:  Gen: chronically ill, no distress HEENT: no jvd, obese, trach cdi PULM: resps even non labored on PS 14/5, diminished bases  CV: s1 s2 rrr AB: BS increased, nontender, no hsm Ext: warm,  increased edema 2 plus Neuro:  Awake, alert, follows commands, nods appropriately, gen weakness   LABS:  Recent Labs Lab 04/04/13 1305  04/06/13 0440 04/06/13 1427  04/08/13 0600 04/09/13 0500 04/10/13 0500 04/11/13 0500  HGB  --   < > 9.6* 9.6*  --  9.4*  --   --  8.6*  WBC  --   < > 9.6 10.6*  --  8.5  --   --  6.0  PLT  --   < > 291 289  --  293  --   --  281  NA  --   --  131*  --   --  134*  --   --  133*  K  --   < > 4.4  --   --  4.1  --   --  3.8  CL  --   --  101  --   --  100  --   --  100  CO2  --   --  26  --   --  27  --   --  27  GLUCOSE  --   --  143*  --   --  111*  --   --  150*  BUN  --   --  19  --   --  20  --   --  15  CREATININE  --   --  0.39*  --   --  0.34*  --   --  0.34*  CALCIUM  --   --  8.0*  --   --  8.5  --   --  8.5  AST  --   --   --   --   --   --   --   --  38*  ALT  --   --   --   --   --   --   --   --  29  ALKPHOS  --   --   --   --   --   --   --   --  402*  BILITOT  --   --   --   --   --   --   --   --  0.3  PROT  --   --   --   --   --   --   --   --  5.3*  ALBUMIN  --   --   --   --   --   --   --   --  1.8*  APTT  --   --   --  34  --  46*  --   --   --   INR  --   --   --  1.00  < > 1.02 0.98 1.00 0.97  PROBNP  --   --   --   --   --  460.5*  --   --   --   PHART 7.461*  --   --   --   --   --   --   --   --   PCO2ART 35.7  --   --   --   --   --   --   --   --   PO2ART 105.0*  --   --   --   --   --   --   --   --   < > = values in this interval not displayed.   Imaging: Dg Chest Portable 1 View  04/11/2013   *RADIOLOGY REPORT*  Clinical Data: Evaluate left lower lobe airspace disease.  PORTABLE CHEST - 1 VIEW  Comparison: 04/08/2013  Findings: The tracheostomy tube is midline and at the level of the thoracic inlet.  There is a left arm PICC line with tip in the cavoatrial junction.  Lung volumes remain decreased.  Persistent opacity in the left lung base.  IMPRESSION:  1.  Persistent left base opacity.   Original Report  Authenticated By: Signa Kell, M.D.   ASSESSMENT / PLAN:   Acute respiratory failure s/p trach--  Multifactorial r/t AMS, sepsis, HCAP.  Trach 5-23  left lung collapse/ ATX/effusion - s/p FOB 6/4 (L main obstructed with thick secretions) Transudative effusion 5/30-- much improved aeration post FOB 6/4 for mucous plugging, still small effusion with studies NOT c/w empyema but CXR seems to be improving.  Previous thora with only 120cc out and culture negative.  P:   Cont PS wean as tol  F/u CXR 6/11 - persistent LLL opacity  Continue hold off on thora for now  Bronchial hygiene >> can d/c chest percussion since secretions have decreased  Code status: full  WHITEHEART,KATHRYN, NP 04/11/2013  10:46 AM Pager: [phone] or [phone]  *Care during the described time interval was provided by me and/or other providers on the critical care team. I have reviewed this patient's available data, including medical history, events of note, physical examination and test results as part of my evaluation.  Coralyn Helling, MD [facility] Pulmonary/Critical Care  04/11/2013, 12:16 PM Pager:  [phone] After 3pm call: [phone]

## 2013-04-12 LAB — PROTIME-INR
INR: 0.98 (ref 0.00–1.49)
Prothrombin Time: 12.9 seconds (ref 11.6–15.2)

## 2013-04-13 LAB — PROTIME-INR: Prothrombin Time: 13.7 seconds (ref 11.6–15.2)

## 2013-04-13 LAB — URINE CULTURE

## 2013-04-14 LAB — CBC
HCT: 28.1 % — ABNORMAL LOW (ref 36.0–46.0)
Hemoglobin: 9.4 g/dL — ABNORMAL LOW (ref 12.0–15.0)
RBC: 2.88 MIL/uL — ABNORMAL LOW (ref 3.87–5.11)
RDW: 16.7 % — ABNORMAL HIGH (ref 11.5–15.5)
WBC: 8.4 10*3/uL (ref 4.0–10.5)

## 2013-04-14 LAB — BASIC METABOLIC PANEL
BUN: 15 mg/dL (ref 6–23)
CO2: 29 mEq/L (ref 19–32)
Chloride: 98 mEq/L (ref 96–112)
GFR calc Af Amer: >90 mL/min (ref >90)
Potassium: 4.1 mEq/L (ref 3.5–5.1)

## 2013-04-14 LAB — PROTIME-INR: INR: 1.33 (ref 0.00–1.49)

## 2013-04-14 NOTE — H&P (Signed)
Krista [name] is an 57 y.o. female.   Chief Complaint: Hx CVA; atrial fib On coumadin but not therapeutic New development Rt LE DVT-per doppler 04/05/13; started Lovenox This am developed hematuria Stop anticoagulation for now Scheduled for Inferior Vena Cava Filter placement Pt on trach/vent Immobile  HPI: HTN; HLD; CVA; Afib; DM; schizophrenia  Past Medical History  Diagnosis Date  . Arthritis   . Hyperlipidemia   . Hypertension   . Allergy   . GERD (gastroesophageal reflux disease)   . Back pain   . Type II diabetes mellitus   . Schizophrenia     /notes 02/17/2013    Past Surgical History  Procedure Laterality Date  . Tubal ligation      Family History  Problem Relation Age of Onset  . Diabetes Mother   . COPD Mother   . Heart disease Mother   . Hyperlipidemia Mother   . Hypertension Mother   . Arthritis Mother    Social History:  reports that she has been smoking Cigarettes.  She has been smoking about 1.00 pack per day. She has never used smokeless tobacco. She reports that she does not drink alcohol. Her drug history is not on file.  Allergies:  Allergies  Allergen Reactions  . Codeine     REACTION: itching    Medications Prior to Admission  Medication Sig Dispense Refill  . albuterol (PROVENTIL HFA;VENTOLIN HFA) 108 (90 BASE) MCG/ACT inhaler Inhale 4 puffs into the lungs every 2 (two) hours as needed for wheezing.      Marland Kitchen antiseptic oral rinse (BIOTENE) LIQD 15 mLs by Mouth Rinse route QID.      Marland Kitchen aspirin 81 MG chewable tablet Place 1 tablet (81 mg total) into feeding tube daily.      . benztropine (COGENTIN) 1 MG tablet Take 1 mg by mouth at bedtime.      . chlorhexidine (PERIDEX) 0.12 % solution Use as directed 15 mLs in the mouth or throat 2 (two) times daily.  120 mL  0  . ciprofloxacin (CIPRO) 400 MG/200ML SOLN Inject 200 mLs (400 mg total) into the vein daily.  2000 mL    . collagenase (SANTYL) ointment Apply 1 application topically daily. To sacrum   15 g  0  . famotidine (PEPCID) 40 MG/5ML suspension Place 2.5 mLs (20 mg total) into feeding tube at bedtime.  50 mL  0  . feeding supplement (PRO-STAT SUGAR FREE 64) LIQD Place 30 mLs into feeding tube 3 (three) times daily.  900 mL  0  . fluPHENAZine decanoate (PROLIXIN) 25 MG/ML injection Inject 1 mL (25 mg total) into the muscle every 14 (fourteen) days.  5 mL    . heparin 5000 UNIT/ML injection Inject 1 mL (5,000 Units total) into the skin every 8 (eight) hours.  1 mL    . insulin aspart (NOVOLOG) 100 UNIT/ML injection Inject 0-15 Units into the skin every 4 (four) hours.  1 vial  12  . metroNIDAZOLE (FLAGYL) 50 mg/ml oral suspension Place 10 mLs (500 mg total) into feeding tube 3 (three) times daily.      . Nutritional Supplements (FEEDING SUPPLEMENT, JEVITY 1.2 CAL,) LIQD 26ml/hr via tube    0  . oxybutynin (DITROPAN) 5 MG tablet Take 5 mg by mouth 2 (two) times daily.       . vancomycin (VANCOCIN) 50 mg/mL oral solution Take 10 mLs (500 mg total) by mouth every 6 (six) hours.        Results  for orders placed during the hospital encounter of 03/16/13 (from the past 48 hour(s))  PROTIME-INR     Status: None   Collection Time    04/13/13  4:50 AM      Result Value Range   Prothrombin Time 13.7  11.6 - 15.2 seconds   INR 1.06  0.00 - 1.49  PROTIME-INR     Status: Abnormal   Collection Time    04/14/13  4:45 AM      Result Value Range   Prothrombin Time 16.2 (*) 11.6 - 15.2 seconds   INR 1.33  0.00 - 1.49  CBC     Status: Abnormal   Collection Time    04/14/13  4:45 AM      Result Value Range   WBC 8.4  4.0 - 10.5 K/uL   RBC 2.88 (*) 3.87 - 5.11 MIL/uL   Hemoglobin 9.4 (*) 12.0 - 15.0 g/dL   HCT 16.1 (*) 09.6 - 04.5 %   MCV 97.6  78.0 - 100.0 fL   MCH 32.6  26.0 - 34.0 pg   MCHC 33.5  30.0 - 36.0 g/dL   RDW 40.9 (*) 81.1 - 91.4 %   Platelets 339  150 - 400 K/uL  BASIC METABOLIC PANEL     Status: Abnormal   Collection Time    04/14/13  4:45 AM      Result Value Range    Sodium 134 (*) 135 - 145 mEq/L   Potassium 4.1  3.5 - 5.1 mEq/L   Chloride 98  96 - 112 mEq/L   CO2 29  19 - 32 mEq/L   Glucose, Bld 176 (*) 70 - 99 mg/dL   BUN 15  6 - 23 mg/dL   Creatinine, Ser 7.82 (*) 0.50 - 1.10 mg/dL   Calcium 8.5  8.4 - 95.6 mg/dL   GFR calc non Af Amer >90  >90 mL/min   GFR calc Af Amer >90  >90 mL/min   Comment:            The eGFR has been calculated     using the CKD EPI equation.     This calculation has not been     validated in all clinical     situations.     eGFR's persistently     <90 mL/min signify     possible Chronic Kidney Disease.   No results found.  Review of Systems  Constitutional: Negative for fever.       Frail, weak; on trach/vent    Blood pressure 130/96, pulse 90, resp. rate 16, last menstrual period 12/31/2009, SpO2 100.00%. Physical Exam  Cardiovascular: Normal rate.   No murmur heard. Respiratory:  Vent/trach  GI: Soft.  Existing G tube  Musculoskeletal:  Can move all 4s to command  Neurological:  nonverbal  Psychiatric:  Consented dtr via phone     Assessment/Plan Trach dependent; CVA; afib On anticoagulation - not therapeutic Developed RLE dvt 6/3 Started Lovenox- New hematuria today Scheduled for IVC filter 6/13 Consented dtr via phone   Krista [name] A 04/14/2013, 3:39 PM

## 2013-04-15 ENCOUNTER — Other Ambulatory Visit (HOSPITAL_COMMUNITY): Payer: Self-pay

## 2013-04-15 DIAGNOSIS — Z95828 Presence of other vascular implants and grafts: Secondary | ICD-10-CM

## 2013-04-15 HISTORY — DX: Presence of other vascular implants and grafts: Z95.828

## 2013-04-15 LAB — CBC
Hemoglobin: 8.5 g/dL — ABNORMAL LOW (ref 12.0–15.0)
MCV: 97.7 fL (ref 78.0–100.0)
Platelets: 331 10*3/uL (ref 150–400)
RBC: 2.6 MIL/uL — ABNORMAL LOW (ref 3.87–5.11)
WBC: 8.3 10*3/uL (ref 4.0–10.5)

## 2013-04-15 MED ORDER — FENTANYL CITRATE 0.05 MG/ML IJ SOLN
INTRAMUSCULAR | Status: DC | PRN
  Administered 2013-04-15 (×2): 50 ug via INTRAVENOUS

## 2013-04-15 MED ORDER — MIDAZOLAM HCL 2 MG/2ML IJ SOLN
INTRAMUSCULAR | Status: DC | PRN
  Administered 2013-04-15 (×2): 1 mg via INTRAVENOUS

## 2013-04-15 MED ORDER — IOHEXOL 300 MG/ML  SOLN
100.0000 mL | Freq: Once | INTRAMUSCULAR | Status: AC | PRN
  Administered 2013-04-15: 50 mL via INTRAVENOUS

## 2013-04-15 MED ORDER — MIDAZOLAM HCL 2 MG/2ML IJ SOLN
INTRAMUSCULAR | Status: AC
  Filled 2013-04-15: qty 2

## 2013-04-15 MED ORDER — FENTANYL CITRATE 0.05 MG/ML IJ SOLN
INTRAMUSCULAR | Status: AC
  Filled 2013-04-15: qty 2

## 2013-04-15 NOTE — Progress Notes (Signed)
PULMONARY  / CRITICAL CARE MEDICINE  Name: Krista [name] MRN: [id number] DOB: 03-06-1956    ADMISSION DATE:  03/16/13 select CONSULTATION DATE:  03/25/13  REFERRING MD :  March Rummage PRIMARY SERVICE: Select MD  CHIEF COMPLAINT:  Vent mgmt in select   BRIEF PATIENT DESCRIPTION: 57 y/o female with recent c.diff and schizophrenia was brought in to [facility] ED on 5/7 by EMS after her family said she had been unresponsive for 24 hours.  She was in septic shock, AKI, and had diarrhea and a UTI. To select , decline >  PNA, collapse, ETT  SIGNIFICANT EVENTS / STUDIES:  5/7 CT head >>neg acute 5/7 CT neck >>neg acute 5/8 RUQ U/S >>chronic medical renal disease, Trace bilateral perinephric fluid 5/8 CT abdo/pelvis>>>Increased abdominal ascites and mesenteric edema. Inflammatory changes surround the pancreas. Question pancreatitis. New bilateral pleural effusions, right greater than left, with near total collapse of the right lower lobe. Multiple thoracolumbar compression fractures are stable. 5/8 Ct >>>neg edema, acute 5/14 to select 5/23- emergent intubation, pus all lobes and collapse, emergent bronch 5-23 trach (DF) 5-23 pressor dependent, cvp 4, on diuretics 6/4 off pressors, tol ATC wean  6/13 IVC filter placed  LINES / TUBES: 5/7 ETT (EDP) >>5/12 5/7 L [place] CVL (EDP) >>5/8  5/8 rt IJ>>>out 5/23 ETT>>>5-23 5-23 trach(DF)>>  CULTURES: 5/7 blood >>>enterobacter S to cipro 5/7 urine >>enterobacter S to cipro 5/7 c.diff >>POS 5/23 Bronch BAL>>> normal flora 5/21 BC>>>NGTD>>neg 5-21uc >VRE 5/30 pleural fluid>>>neg   ANTIBIOTICS: PO Vancomycin  SUBJECTIVE:  Trach capped with #6 cuffed trach.    VITAL SIGNS:   Vital signs reviewed in bedside chart.  PHYSICAL EXAMINATION:  Gen: no distress HEENT: trach site clean PULM: decreased breath sounds, no wheeze CV: regular AB: soft, non tender Ext: 1+ edema Neuro:  Awake, alert, follows commands  LABS:  Recent Labs Lab 04/11/13 0500   04/13/13 0450 04/14/13 0445 04/15/13 0500  HGB 8.6*  --   --  9.4* 8.5*  WBC 6.0  --   --  8.4 8.3  PLT 281  --   --  339 331  NA 133*  --   --  134*  --   K 3.8  --   --  4.1  --   CL 100  --   --  98  --   CO2 27  --   --  29  --   GLUCOSE 150*  --   --  176*  --   BUN 15  --   --  15  --   CREATININE 0.34*  --   --  0.37*  --   CALCIUM 8.5  --   --  8.5  --   AST 38*  --   --   --   --   ALT 29  --   --   --   --   ALKPHOS 402*  --   --   --   --   BILITOT 0.3  --   --   --   --   PROT 5.3*  --   --   --   --   ALBUMIN 1.8*  --   --   --   --   INR 0.97  < > 1.06 1.33 1.82*  < > = values in this interval not displayed.   Imaging: Ir Ivc Filter Plmt / S&i /img Guid/mod Sed  04/15/2013   *RADIOLOGY REPORT*  Indication: 57 year old with right  lower extremity DVT.  The patient developed hematuria on anticoagulation.  The patient needs pulmonary embolism prophylaxis.  PROCEDURE(S): IVC FILTER PLACEMENT; IVC VENOGRAM; ULTRASOUND FOR VASCULAR ACCESS  Physician:  Rachelle Hora. Henn, MD  Medications: Versed 2 mg, Fentanyl 100 mcg. A radiology nurse monitored the patient for moderate sedation.  Moderate sedation time: 17 minutes  Fluoroscopy time: 2.00 minutes  Contrast:50 ml Omnipaque-300  Procedure:Informed consent was obtained for an IVC venogram and filter placement.  Ultrasound demonstrated a patent right common femoral vein.  Ultrasound images were obtained for documentation. The right groin was prepped and draped in a sterile fashion. Maximal barrier sterile technique was utilized including caps, mask, sterile gowns, sterile gloves, sterile drape, hand hygiene and skin antiseptic.  The skin was anesthetized with 1% lidocaine. A 21 gauge needle was directed into the vein with ultrasound guidance and a micropuncture dilator set was placed.  A wire was advanced into the IVC.  The filter sheath was advanced over the wire into the IVC.  An IVC venogram was performed.  Fluoroscopic images were  obtained for documentation.  A Denali filter was deployed below the lowest renal vein. A follow-up venogram was performed and the vascular sheath was removed with manual compression.  Findings:IVC was patent.  Bilateral renal veins were identified. The filter was deployed below the lowest renal vein.  Follow-up venogram confirmed placement within the IVC and below the renal veins.  Impression:Successful placement of a retrievable IVC filter.   Original Report Authenticated By: Richarda Overlie, M.D.   ASSESSMENT / PLAN:   Acute respiratory failure s/p trach--  Multifactorial r/t AMS, sepsis, HCAP.  Trach 5-23  left lung collapse/ ATX/effusion - s/p FOB 6/4 (L main obstructed with thick secretions) Transudative effusion 5/30  P:   Trach capped >> if she does well over weekend, then consider decannulation on 6/16 F/u CXR as needed Continue duoneb, pulmicort neb Tx  Code status: full  Coralyn Helling, MD [facility] Pulmonary/Critical Care 04/15/2013, 10:56 AM Pager:  [phone] After 3pm call: [phone]

## 2013-04-15 NOTE — Procedures (Signed)
Placement of IVC filter.  No immediate complication.  See Radiology report.

## 2013-04-16 LAB — CBC
HCT: 25.6 % — ABNORMAL LOW (ref 36.0–46.0)
Hemoglobin: 8.5 g/dL — ABNORMAL LOW (ref 12.0–15.0)
WBC: 8.4 10*3/uL (ref 4.0–10.5)

## 2013-04-16 LAB — PROTIME-INR
INR: 2.38 — ABNORMAL HIGH (ref 0.00–1.49)
Prothrombin Time: 24.9 seconds — ABNORMAL HIGH (ref 11.6–15.2)

## 2013-04-17 LAB — PROTIME-INR
INR: 3.42 — ABNORMAL HIGH (ref 0.00–1.49)
Prothrombin Time: 32.6 seconds — ABNORMAL HIGH (ref 11.6–15.2)

## 2013-04-18 ENCOUNTER — Other Ambulatory Visit (HOSPITAL_COMMUNITY): Payer: Self-pay

## 2013-04-18 LAB — COMPREHENSIVE METABOLIC PANEL
ALT: 34 U/L (ref 0–35)
AST: 24 U/L (ref 0–37)
Albumin: 2.1 g/dL — ABNORMAL LOW (ref 3.5–5.2)
CO2: 22 mEq/L (ref 19–32)
Calcium: 8.5 mg/dL (ref 8.4–10.5)
Chloride: 100 mEq/L (ref 96–112)
Creatinine, Ser: 0.38 mg/dL — ABNORMAL LOW (ref 0.50–1.10)
GFR calc non Af Amer: >90 mL/min (ref >90)
Sodium: 133 mEq/L — ABNORMAL LOW (ref 135–145)

## 2013-04-18 LAB — CBC
MCH: 32.4 pg (ref 26.0–34.0)
MCV: 96.7 fL (ref 78.0–100.0)
Platelets: 344 10*3/uL (ref 150–400)
RBC: 2.72 MIL/uL — ABNORMAL LOW (ref 3.87–5.11)
RDW: 16.6 % — ABNORMAL HIGH (ref 11.5–15.5)
WBC: 9.1 10*3/uL (ref 4.0–10.5)

## 2013-04-18 LAB — PROTIME-INR: Prothrombin Time: 27.5 seconds — ABNORMAL HIGH (ref 11.6–15.2)

## 2013-04-18 NOTE — Progress Notes (Signed)
PULMONARY  / CRITICAL CARE MEDICINE  Name: Krista [name] MRN: [id number] DOB: 12/06/1955    ADMISSION DATE:  03/16/13 [facility] CONSULTATION DATE:  03/25/13  REFERRING MD :  March Rummage PRIMARY SERVICE: [facility] MD  CHIEF COMPLAINT:  Vent mgmt in [facility]   BRIEF PATIENT DESCRIPTION: 57 y/o female with recent c.diff and schizophrenia was brought in to [facility] ED on 5/7 by EMS after her family said she had been unresponsive for 24 hours.  She was in septic shock, AKI, and had diarrhea and a UTI. To [facility] , decline >  PNA, collapse, ETT  SIGNIFICANT EVENTS / STUDIES:  5/7 CT head >>neg acute 5/7 CT neck >>neg acute 5/8 RUQ U/S >>chronic medical renal disease, Trace bilateral perinephric fluid 5/8 CT abdo/pelvis>>>Increased abdominal ascites and mesenteric edema. Inflammatory changes surround the pancreas. Question pancreatitis. New bilateral pleural effusions, right greater than left, with near total collapse of the right lower lobe. Multiple thoracolumbar compression fractures are stable. 5/8 Ct >>>neg edema, acute 5/14 to [facility] 5/23- emergent intubation, pus all lobes and collapse, emergent bronch 5-23 trach (DF) 5-23 pressor dependent, cvp 4, on diuretics 6/4 off pressors, tol ATC wean  6/13 IVC filter placed  LINES / TUBES: 5/7 ETT (EDP) >>5/12 5/7 L [place] CVL (EDP) >>5/8  5/8 rt IJ>>>out 5/23 ETT>>>5-23 5-23 trach(DF)>>  CULTURES: 5/7 blood >>>enterobacter S to cipro 5/7 urine >>enterobacter S to cipro 5/7 c.diff >>POS 5/23 Bronch BAL>>> normal flora 5/21 BC>>>NGTD>>neg 5-21uc >VRE 5/30 pleural fluid>>>neg   ANTIBIOTICS: PO Vancomycin  SUBJECTIVE:  Decannulated over weekend  VITAL SIGNS:   Vital signs reviewed in bedside chart.  PHYSICAL EXAMINATION:  Gen: no distress HEENT: trach site clean PULM: decreased breath sounds, no wheeze CV: regular AB: soft, non tender Ext: 1+ edema Neuro:  Awake, alert, follows commands  LABS:  Recent Labs Lab 04/14/13 0445  04/15/13 0500 04/15/13 1500 04/16/13 0500 04/17/13 0730 04/18/13 0531  HGB 9.4* 8.5* 9.0* 8.5*  --  8.8*  WBC 8.4 8.3  --  8.4  --  9.1  PLT 339 331  --  295  --  344  NA 134*  --   --   --   --  133*  K 4.1  --   --   --   --  4.0  CL 98  --   --   --   --  100  CO2 29  --   --   --   --  22  GLUCOSE 176*  --   --   --   --  93  BUN 15  --   --   --   --  6  CREATININE 0.37*  --   --   --   --  0.38*  CALCIUM 8.5  --   --   --   --  8.5  AST  --   --   --   --   --  24  ALT  --   --   --   --   --  34  ALKPHOS  --   --   --   --   --  458*  BILITOT  --   --   --   --   --  0.3  PROT  --   --   --   --   --  6.1  ALBUMIN  --   --   --   --   --  2.1*  INR 1.33 1.82*  --  2.38* 3.42* 2.72*     Imaging: Dg Chest Port 1 View  04/18/2013   *RADIOLOGY REPORT*  Clinical Data: Shortness of breath, pneumonia, cough, leg pain  PORTABLE CHEST - 1 VIEW  Comparison: Portable exam 0630 hours compared to 04/11/2013  Findings: Rotated to the left and slightly kyphotic. Tracheostomy tube no longer seen. Tip of left arm PICC line projects over right atrium. Normal heart size, mediastinal contours and pulmonary vascularity for degree of rotation. Right basilar atelectasis versus infiltrate. Remaining lungs grossly clear. No pleural effusion or pneumothorax. Bones demineralized.  IMPRESSION: Minimal right basilar atelectasis versus infiltrate. Tip of left arm PICC line projects over right atrium, has advanced since the previous exam; recommend withdrawal 5 cm to position at approximately the cavoatrial junction.  Findings called to patient's nurse Jane on 04/18/2013 at 0758 hours.   Original Report Authenticated By: Ulyses Southward, M.D.   ASSESSMENT / PLAN:   Acute respiratory failure s/p trach--  Multifactorial r/t AMS, sepsis, HCAP.  Trach 5-23  left lung collapse/ ATX/effusion - s/p FOB 6/4 (L main obstructed with thick secretions) Transudative effusion 5/30  P:    decannulation on 6/15 and now  DNR F/u CXR as needed Continue duoneb, pulmicort neb Tx Vest bid for pulm toilet- mobilise as able  Code status: DNR   PCCM will sign off  Brett Canales Minor ACNP Adolph Pollack PCCM Pager [phone] till 3 pm If no answer page [phone]  Care during the described time interval was provided by me and/or other providers on the critical care team.  I have reviewed this patient's available data, including medical history, events of note, physical examination and test results as part of my evaluation  Pheng Prokop V.   04/18/2013, 10:47 AM

## 2013-04-19 LAB — PROTIME-INR: INR: 2.19 — ABNORMAL HIGH (ref 0.00–1.49)

## 2013-04-20 LAB — PROTIME-INR: Prothrombin Time: 24.2 seconds — ABNORMAL HIGH (ref 11.6–15.2)

## 2013-04-20 LAB — DIGOXIN LEVEL: Digoxin Level: 1.1 ng/mL (ref 0.8–2.0)

## 2013-04-21 LAB — BASIC METABOLIC PANEL
Calcium: 8.8 mg/dL (ref 8.4–10.5)
GFR calc Af Amer: >90 mL/min (ref >90)
GFR calc non Af Amer: >90 mL/min (ref >90)
Potassium: 3.9 mEq/L (ref 3.5–5.1)
Sodium: 133 mEq/L — ABNORMAL LOW (ref 135–145)

## 2013-04-21 LAB — CBC
MCH: 32.4 pg (ref 26.0–34.0)
MCHC: 33.5 g/dL (ref 30.0–36.0)
Platelets: 523 10*3/uL — ABNORMAL HIGH (ref 150–400)
RDW: 16.2 % — ABNORMAL HIGH (ref 11.5–15.5)

## 2013-04-21 LAB — PROTIME-INR
INR: 2.71 — ABNORMAL HIGH (ref 0.00–1.49)
Prothrombin Time: 27.4 seconds — ABNORMAL HIGH (ref 11.6–15.2)

## 2013-04-22 ENCOUNTER — Other Ambulatory Visit (HOSPITAL_COMMUNITY): Payer: Self-pay

## 2013-04-22 LAB — CBC
HCT: 22.9 % — ABNORMAL LOW (ref 36.0–46.0)
MCHC: 34.5 g/dL (ref 30.0–36.0)
Platelets: 502 10*3/uL — ABNORMAL HIGH (ref 150–400)
RDW: 15.6 % — ABNORMAL HIGH (ref 11.5–15.5)
WBC: 10.1 10*3/uL (ref 4.0–10.5)

## 2013-04-22 LAB — BASIC METABOLIC PANEL
BUN: 9 mg/dL (ref 6–23)
Chloride: 98 mEq/L (ref 96–112)
GFR calc Af Amer: >90 mL/min (ref >90)
GFR calc non Af Amer: >90 mL/min (ref >90)
Potassium: 3.9 mEq/L (ref 3.5–5.1)
Sodium: 128 mEq/L — ABNORMAL LOW (ref 135–145)

## 2013-04-22 LAB — PROTIME-INR
INR: 2.73 — ABNORMAL HIGH (ref 0.00–1.49)
Prothrombin Time: 27.6 seconds — ABNORMAL HIGH (ref 11.6–15.2)

## 2013-04-23 LAB — PROTIME-INR: Prothrombin Time: 29.1 seconds — ABNORMAL HIGH (ref 11.6–15.2)

## 2013-04-24 ENCOUNTER — Other Ambulatory Visit (HOSPITAL_COMMUNITY): Payer: Self-pay

## 2013-04-24 LAB — PROTIME-INR: Prothrombin Time: 33.4 seconds — ABNORMAL HIGH (ref 11.6–15.2)

## 2013-04-24 LAB — BASIC METABOLIC PANEL
CO2: 22 mEq/L (ref 19–32)
Calcium: 8.4 mg/dL (ref 8.4–10.5)
GFR calc non Af Amer: >90 mL/min (ref >90)
Sodium: 129 mEq/L — ABNORMAL LOW (ref 135–145)

## 2013-04-24 LAB — CBC
MCH: 31.6 pg (ref 26.0–34.0)
Platelets: 588 10*3/uL — ABNORMAL HIGH (ref 150–400)
RBC: 2.56 MIL/uL — ABNORMAL LOW (ref 3.87–5.11)

## 2013-04-24 LAB — PRO B NATRIURETIC PEPTIDE: Pro B Natriuretic peptide (BNP): 409.1 pg/mL — ABNORMAL HIGH (ref 0–125)

## 2013-04-25 LAB — RENAL FUNCTION PANEL
Calcium: 8.7 mg/dL (ref 8.4–10.5)
GFR calc Af Amer: >90 mL/min (ref >90)
GFR calc non Af Amer: >90 mL/min (ref >90)
Phosphorus: 4.3 mg/dL (ref 2.3–4.6)
Sodium: 129 mEq/L — ABNORMAL LOW (ref 135–145)

## 2013-04-25 LAB — PROTIME-INR: Prothrombin Time: 26.9 seconds — ABNORMAL HIGH (ref 11.6–15.2)

## 2013-04-25 LAB — CBC
MCH: 31.1 pg (ref 26.0–34.0)
MCHC: 32.5 g/dL (ref 30.0–36.0)
Platelets: 694 10*3/uL — ABNORMAL HIGH (ref 150–400)
RBC: 2.8 MIL/uL — ABNORMAL LOW (ref 3.87–5.11)
RDW: 16.1 % — ABNORMAL HIGH (ref 11.5–15.5)

## 2013-04-26 LAB — CBC
MCH: 31.1 pg (ref 26.0–34.0)
MCV: 93.7 fL (ref 78.0–100.0)
Platelets: 564 10*3/uL — ABNORMAL HIGH (ref 150–400)
RDW: 16.1 % — ABNORMAL HIGH (ref 11.5–15.5)

## 2013-04-26 LAB — BASIC METABOLIC PANEL
BUN: 13 mg/dL (ref 6–23)
CO2: 22 mEq/L (ref 19–32)
Calcium: 8.6 mg/dL (ref 8.4–10.5)
Creatinine, Ser: 0.34 mg/dL — ABNORMAL LOW (ref 0.50–1.10)
GFR calc Af Amer: >90 mL/min (ref >90)

## 2013-04-26 LAB — URINALYSIS, ROUTINE W REFLEX MICROSCOPIC
Bilirubin Urine: NEGATIVE
Glucose, UA: NEGATIVE mg/dL
Ketones, ur: NEGATIVE mg/dL
Protein, ur: 30 mg/dL — AB

## 2013-04-26 LAB — URINE MICROSCOPIC-ADD ON

## 2013-04-28 ENCOUNTER — Other Ambulatory Visit (HOSPITAL_COMMUNITY): Payer: Self-pay

## 2013-04-28 LAB — CBC
HCT: 25.1 % — ABNORMAL LOW (ref 36.0–46.0)
Hemoglobin: 8.1 g/dL — ABNORMAL LOW (ref 12.0–15.0)
MCHC: 32.3 g/dL (ref 30.0–36.0)
RBC: 2.61 MIL/uL — ABNORMAL LOW (ref 3.87–5.11)
WBC: 16.9 10*3/uL — ABNORMAL HIGH (ref 4.0–10.5)

## 2013-04-28 LAB — BASIC METABOLIC PANEL
BUN: 20 mg/dL (ref 6–23)
CO2: 21 mEq/L (ref 19–32)
Chloride: 104 mEq/L (ref 96–112)
Glucose, Bld: 114 mg/dL — ABNORMAL HIGH (ref 70–99)
Potassium: 5.2 mEq/L — ABNORMAL HIGH (ref 3.5–5.1)
Sodium: 132 mEq/L — ABNORMAL LOW (ref 135–145)

## 2013-04-28 LAB — PROTIME-INR: INR: 2.31 — ABNORMAL HIGH (ref 0.00–1.49)

## 2013-04-28 LAB — URINE CULTURE: Colony Count: >=100000

## 2013-04-29 LAB — BASIC METABOLIC PANEL
BUN: 19 mg/dL (ref 6–23)
CO2: 22 mEq/L (ref 19–32)
Chloride: 102 mEq/L (ref 96–112)
Creatinine, Ser: 0.4 mg/dL — ABNORMAL LOW (ref 0.50–1.10)
GFR calc Af Amer: >90 mL/min (ref >90)
Potassium: 4.7 mEq/L (ref 3.5–5.1)

## 2013-04-29 LAB — CBC
HCT: 26.6 % — ABNORMAL LOW (ref 36.0–46.0)
Hemoglobin: 8.6 g/dL — ABNORMAL LOW (ref 12.0–15.0)
MCV: 96 fL (ref 78.0–100.0)
RBC: 2.77 MIL/uL — ABNORMAL LOW (ref 3.87–5.11)
WBC: 15.1 10*3/uL — ABNORMAL HIGH (ref 4.0–10.5)

## 2013-04-29 LAB — PROTIME-INR: INR: 2.2 — ABNORMAL HIGH (ref 0.00–1.49)

## 2013-04-30 LAB — PROTIME-INR: Prothrombin Time: 25.1 seconds — ABNORMAL HIGH (ref 11.6–15.2)

## 2013-05-01 LAB — PROTIME-INR
INR: 2.23 — ABNORMAL HIGH (ref 0.00–1.49)
Prothrombin Time: 24 seconds — ABNORMAL HIGH (ref 11.6–15.2)

## 2013-05-02 LAB — CBC WITH DIFFERENTIAL/PLATELET
Basophils Absolute: 0 10*3/uL (ref 0.0–0.1)
Eosinophils Relative: 0 % (ref 0–5)
Lymphocytes Relative: 23 % (ref 12–46)
Lymphs Abs: 2.8 10*3/uL (ref 0.7–4.0)
MCV: 94.3 fL (ref 78.0–100.0)
Neutro Abs: 8.9 10*3/uL — ABNORMAL HIGH (ref 1.7–7.7)
Platelets: 516 10*3/uL — ABNORMAL HIGH (ref 150–400)
RBC: 2.46 MIL/uL — ABNORMAL LOW (ref 3.87–5.11)
RDW: 16.4 % — ABNORMAL HIGH (ref 11.5–15.5)
WBC: 12.5 10*3/uL — ABNORMAL HIGH (ref 4.0–10.5)

## 2013-05-02 LAB — COMPREHENSIVE METABOLIC PANEL
ALT: 10 U/L (ref 0–35)
AST: 12 U/L (ref 0–37)
Alkaline Phosphatase: 377 U/L — ABNORMAL HIGH (ref 39–117)
CO2: 19 mEq/L (ref 19–32)
Chloride: 105 mEq/L (ref 96–112)
GFR calc Af Amer: >90 mL/min (ref >90)
GFR calc non Af Amer: >90 mL/min (ref >90)
Glucose, Bld: 89 mg/dL (ref 70–99)
Potassium: 3.6 mEq/L (ref 3.5–5.1)
Sodium: 136 mEq/L (ref 135–145)
Total Bilirubin: 0.2 mg/dL — ABNORMAL LOW (ref 0.3–1.2)

## 2013-05-02 LAB — PROTIME-INR: Prothrombin Time: 23.4 seconds — ABNORMAL HIGH (ref 11.6–15.2)

## 2013-05-04 LAB — PROTIME-INR
INR: 2.14 — ABNORMAL HIGH (ref 0.00–1.49)
Prothrombin Time: 23.2 seconds — ABNORMAL HIGH (ref 11.6–15.2)

## 2013-05-05 LAB — CBC
MCV: 94.5 fL (ref 78.0–100.0)
Platelets: 553 10*3/uL — ABNORMAL HIGH (ref 150–400)
RBC: 2.74 MIL/uL — ABNORMAL LOW (ref 3.87–5.11)
RDW: 16.3 % — ABNORMAL HIGH (ref 11.5–15.5)
WBC: 11.7 10*3/uL — ABNORMAL HIGH (ref 4.0–10.5)

## 2013-05-05 LAB — BASIC METABOLIC PANEL
CO2: 21 mEq/L (ref 19–32)
Calcium: 8.6 mg/dL (ref 8.4–10.5)
Chloride: 102 mEq/L (ref 96–112)
Creatinine, Ser: 0.38 mg/dL — ABNORMAL LOW (ref 0.50–1.10)
GFR calc Af Amer: >90 mL/min (ref >90)
Sodium: 131 mEq/L — ABNORMAL LOW (ref 135–145)

## 2013-05-05 LAB — PROTIME-INR: Prothrombin Time: 23.1 seconds — ABNORMAL HIGH (ref 11.6–15.2)

## 2013-05-06 LAB — CBC
Hemoglobin: 9.2 g/dL — ABNORMAL LOW (ref 12.0–15.0)
MCH: 31 pg (ref 26.0–34.0)
MCHC: 33.1 g/dL (ref 30.0–36.0)
Platelets: 536 10*3/uL — ABNORMAL HIGH (ref 150–400)
RBC: 2.97 MIL/uL — ABNORMAL LOW (ref 3.87–5.11)

## 2013-05-06 LAB — BASIC METABOLIC PANEL
Calcium: 8.6 mg/dL (ref 8.4–10.5)
GFR calc non Af Amer: >90 mL/min (ref >90)
Potassium: 5.1 mEq/L (ref 3.5–5.1)
Sodium: 132 mEq/L — ABNORMAL LOW (ref 135–145)

## 2013-05-06 LAB — PROTIME-INR
INR: 2.39 — ABNORMAL HIGH (ref 0.00–1.49)
Prothrombin Time: 25.3 seconds — ABNORMAL HIGH (ref 11.6–15.2)

## 2013-05-06 LAB — CLOSTRIDIUM DIFFICILE BY PCR: Toxigenic C. Difficile by PCR: NEGATIVE

## 2013-05-07 ENCOUNTER — Other Ambulatory Visit (HOSPITAL_COMMUNITY): Payer: Self-pay

## 2013-05-07 LAB — BASIC METABOLIC PANEL
BUN: 17 mg/dL (ref 6–23)
GFR calc Af Amer: >90 mL/min (ref >90)
GFR calc non Af Amer: >90 mL/min (ref >90)
Potassium: 4.2 mEq/L (ref 3.5–5.1)
Sodium: 132 mEq/L — ABNORMAL LOW (ref 135–145)

## 2013-05-07 LAB — CBC WITH DIFFERENTIAL/PLATELET
Basophils Absolute: 0 10*3/uL (ref 0.0–0.1)
Basophils Relative: 0 % (ref 0–1)
Eosinophils Absolute: 0 10*3/uL (ref 0.0–0.7)
MCH: 31 pg (ref 26.0–34.0)
MCHC: 33.3 g/dL (ref 30.0–36.0)
Neutro Abs: 8.5 10*3/uL — ABNORMAL HIGH (ref 1.7–7.7)
Neutrophils Relative %: 70 % (ref 43–77)
Platelets: 531 10*3/uL — ABNORMAL HIGH (ref 150–400)
RDW: 16.1 % — ABNORMAL HIGH (ref 11.5–15.5)

## 2013-05-07 LAB — PROTIME-INR
INR: 2.95 — ABNORMAL HIGH (ref 0.00–1.49)
Prothrombin Time: 29.7 seconds — ABNORMAL HIGH (ref 11.6–15.2)

## 2013-05-08 LAB — PROTIME-INR: Prothrombin Time: 26.7 seconds — ABNORMAL HIGH (ref 11.6–15.2)

## 2013-05-09 LAB — BASIC METABOLIC PANEL
BUN: 18 mg/dL (ref 6–23)
Calcium: 8.5 mg/dL (ref 8.4–10.5)
GFR calc non Af Amer: >90 mL/min (ref >90)
Glucose, Bld: 94 mg/dL (ref 70–99)
Sodium: 133 mEq/L — ABNORMAL LOW (ref 135–145)

## 2013-05-09 LAB — CBC
Hemoglobin: 9.3 g/dL — ABNORMAL LOW (ref 12.0–15.0)
MCH: 30.7 pg (ref 26.0–34.0)
MCHC: 33 g/dL (ref 30.0–36.0)
RDW: 16.1 % — ABNORMAL HIGH (ref 11.5–15.5)

## 2013-05-09 LAB — PROTIME-INR: Prothrombin Time: 23.7 seconds — ABNORMAL HIGH (ref 11.6–15.2)

## 2013-05-10 LAB — PROTIME-INR: INR: 1.94 — ABNORMAL HIGH (ref 0.00–1.49)

## 2013-05-17 ENCOUNTER — Non-Acute Institutional Stay: Payer: Self-pay | Admitting: Family Medicine

## 2013-05-17 DIAGNOSIS — F29 Unspecified psychosis not due to a substance or known physiological condition: Secondary | ICD-10-CM

## 2013-05-17 DIAGNOSIS — M549 Dorsalgia, unspecified: Secondary | ICD-10-CM

## 2013-05-17 DIAGNOSIS — T148XXA Other injury of unspecified body region, initial encounter: Secondary | ICD-10-CM

## 2013-05-17 DIAGNOSIS — E1139 Type 2 diabetes mellitus with other diabetic ophthalmic complication: Secondary | ICD-10-CM

## 2013-05-17 DIAGNOSIS — I82401 Acute embolism and thrombosis of unspecified deep veins of right lower extremity: Secondary | ICD-10-CM

## 2013-05-17 DIAGNOSIS — Z95828 Presence of other vascular implants and grafts: Secondary | ICD-10-CM

## 2013-05-17 DIAGNOSIS — I498 Other specified cardiac arrhythmias: Secondary | ICD-10-CM

## 2013-05-17 DIAGNOSIS — L89159 Pressure ulcer of sacral region, unspecified stage: Secondary | ICD-10-CM | POA: Insufficient documentation

## 2013-05-17 DIAGNOSIS — L89153 Pressure ulcer of sacral region, stage 3: Secondary | ICD-10-CM

## 2013-05-17 DIAGNOSIS — I639 Cerebral infarction, unspecified: Secondary | ICD-10-CM

## 2013-05-17 HISTORY — DX: Pressure ulcer of sacral region, unspecified stage: L89.159

## 2013-05-17 HISTORY — DX: Presence of other vascular implants and grafts: Z95.828

## 2013-05-17 HISTORY — DX: Other injury of unspecified body region, initial encounter: T14.8XXA

## 2013-05-17 NOTE — Assessment & Plan Note (Addendum)
Large but per daughter, improved compared to when initially hospitalized. Wound vac had been tried but patient did not tolerate it.  Will consult wound care. Currently receiving wet to dry change in dressing.

## 2013-05-17 NOTE — Assessment & Plan Note (Addendum)
Seen at mental health. Receives fluphenazine every 2 weeks. Cogentin was listed in her previous MAR but most recent medicine reconciliation did not show it. Will monitor patient on current therapy. If significant extrapyramidal symptoms, could consider cogentin.  Patient was also started xanax which should probably be monitored and weaned.  Patient is alert and oriented x4 and appears stable at this time.

## 2013-05-17 NOTE — Assessment & Plan Note (Signed)
Controled on diltiazem 360. Monitor heart rate and BP.

## 2013-05-17 NOTE — Assessment & Plan Note (Signed)
Resume lantus 18u bid.

## 2013-05-17 NOTE — Assessment & Plan Note (Addendum)
Appears to be a chronic problem. Unclear etiology. Was on ibuprofen 200mg  at previous nursing home. May discontinue this in setting of recent acute renal failure.  Also on lidoderm patch. Continue and monitor.

## 2013-05-17 NOTE — Assessment & Plan Note (Signed)
Patient appeared to have been started on xarelto in previous nursing home. Currently on ASA and xarelto. Continue and monitor for signs of bleeding. Consider d/cin ASA

## 2013-05-17 NOTE — Progress Notes (Signed)
Subjective:     Patient ID: Krista [name], female   DOB: January 26, 1956, 57 y.o.   MRN: [id number]  [facility] Admission Note:  HPI 57 yo female with extensive medical history (including  paranoid schizophrenia, DM2, recent CVA, h/o NSTEMI) who was recently hospitalized with sepsis and acute respiratory failure. - Recent Hospitalization:  She was admitted to [facility] Intensive Care Unit on 03/09/13 after being found unresponsive for 24 hours. She was intubated and diagnosed with septic shock. She was noted to have large sacral decubitus ulcer, rhabdomyolysis and acute renal failure. She developed an NSTEMI. She also developed clostridium difficile for which she was treated with IV cipro and oral vancomycin. She was extubated and sent to [facility] for rehab and wound care.  At [facility], she was continued on her medications for paranoid schizophrenia but continued to slowly decline. She had gastrostomy tube placed by IR. She was hypernatremic and free water was added. She developed an enteroccocus urinary tract infection and IV vanc was restarted. She continued to decline with worsening mental function and difficulty to arouse, at which point she was intubated and later trached by the ICU team on 03/25/13. Vancomycin and flagyl per tube were continued, lasix was discontinued due to renal failure. She had tracheostomy tube placed and thereafter started improving rapidly. She was on zyvox, flagyl and merrem which were stopped on 04/04/13. Vancomycin per tube was continued.   On 04/05/13, she was found to have right lower extremity DVT and lovenox was started. She developed hematuria at which point anticoagulation was stopped and Inferior Vena Cava Filter was placed on 04/15/13,   She developed left lung collapse and seen by critical care who started nebulizer treatments and performed bronchoscopy. She was started back on meropenem.  G tube was placed again on 04/08/13 and feeds  started back on 04/09/13 without problem.  At that time, her mental status changed. She had MRI that showed acute infarct in frontal parietal white matter, right more than left. Aspirin and pravachol were started. She then improved in mental status. She was decannulated on 04/17/2013 without problem.    Recent course since discharge from the hospital: She was discharged to 05/10/13 to [facility] for continued rehab and wound care. Her daughter was not happy with the care provided for her sacral wound and decided to transfer her.   Since her hospitalization, patient has not been able to bear weight due to excessive weakness. She is dependent for all transfers. When she ambulates, she uses wheel chair.  Her daughter reports her appetite as excellent. She currently doesn't use her G tube and eats a soft diet with thickened liquids.  She is incontinent of stool and urine and has foley in place. She denies any significant diarrhea. She denies any abdominal pain or dysuria although her daughter does say that she has had chills in the last week. She denies any cough, difficulty breathing, or chest pain. No nausea, no vomiting.    Review of Systems Negative except per HPI Social History:  Daughter: Velna Hatchet      Objective:   Physical Exam  General: no acute distress, non toxic Alert and oriented to person, time, place and circumstance. Some tangential speech  HEENT: moist mucous membranes, oropharynx clear, mildly slurred speech but comprehensible, PERRLA CV: S1S2, regular rate and rhythm, no murmur appreciated Pulm: normal work of breathing on room air, expiratory wheezing bilaterally, right more than left Abd: G tube in place  with surrounding skin clean and dry, abdomen not significantly tender, no distention, no guarding or rebound Neuro: 4+/5 strength in upper extremities bilaterally, 2/5 strength in lower extremities right weaker than left.  Skin - 6 erythematous superficial  blisters on mid back (averaging 2cm in size) Sacral decubitus ulcer: 11x10x3.5cm with granulation tissue as well as yellow exudate Stage 1 ulcer below it.      Assessment:     See problem list    Plan:     See problem list

## 2013-05-17 NOTE — Assessment & Plan Note (Signed)
Recent right lower extremity DVT likely from immobilization. SVC filter placed.  Continue xarelto.

## 2013-05-17 NOTE — Assessment & Plan Note (Signed)
Multiple skin abrasions on mid back, with signs of blistering. No new medication that could obviously be causing this. Will continue to monitor and follow with wound care.

## 2013-05-18 ENCOUNTER — Other Ambulatory Visit: Payer: Self-pay | Admitting: Family Medicine

## 2013-05-18 ENCOUNTER — Non-Acute Institutional Stay: Payer: Medicare Other | Admitting: Family Medicine

## 2013-05-18 DIAGNOSIS — R531 Weakness: Secondary | ICD-10-CM | POA: Insufficient documentation

## 2013-05-18 DIAGNOSIS — E43 Unspecified severe protein-calorie malnutrition: Secondary | ICD-10-CM

## 2013-05-18 DIAGNOSIS — R310 Gross hematuria: Secondary | ICD-10-CM

## 2013-05-18 DIAGNOSIS — E1142 Type 2 diabetes mellitus with diabetic polyneuropathy: Secondary | ICD-10-CM

## 2013-05-18 DIAGNOSIS — Z95828 Presence of other vascular implants and grafts: Secondary | ICD-10-CM

## 2013-05-18 DIAGNOSIS — E1149 Type 2 diabetes mellitus with other diabetic neurological complication: Secondary | ICD-10-CM

## 2013-05-18 DIAGNOSIS — M1710 Unilateral primary osteoarthritis, unspecified knee: Secondary | ICD-10-CM

## 2013-05-18 DIAGNOSIS — L89153 Pressure ulcer of sacral region, stage 3: Secondary | ICD-10-CM

## 2013-05-18 DIAGNOSIS — D638 Anemia in other chronic diseases classified elsewhere: Secondary | ICD-10-CM

## 2013-05-18 DIAGNOSIS — I6350 Cerebral infarction due to unspecified occlusion or stenosis of unspecified cerebral artery: Secondary | ICD-10-CM

## 2013-05-18 DIAGNOSIS — I639 Cerebral infarction, unspecified: Secondary | ICD-10-CM

## 2013-05-18 DIAGNOSIS — L89109 Pressure ulcer of unspecified part of back, unspecified stage: Secondary | ICD-10-CM

## 2013-05-18 DIAGNOSIS — R109 Unspecified abdominal pain: Secondary | ICD-10-CM

## 2013-05-18 DIAGNOSIS — Z9889 Other specified postprocedural states: Secondary | ICD-10-CM

## 2013-05-18 DIAGNOSIS — I10 Essential (primary) hypertension: Secondary | ICD-10-CM

## 2013-05-18 DIAGNOSIS — R5381 Other malaise: Secondary | ICD-10-CM

## 2013-05-18 DIAGNOSIS — L8993 Pressure ulcer of unspecified site, stage 3: Secondary | ICD-10-CM

## 2013-05-18 HISTORY — DX: Anemia in other chronic diseases classified elsewhere: D63.8

## 2013-05-18 MED ORDER — TRAMADOL HCL 50 MG PO TABS: 50.0000 mg | ORAL_TABLET | Freq: Three times a day (TID) | ORAL | Status: DC | PRN

## 2013-05-18 MED ORDER — ACETAMINOPHEN ER 650 MG PO TBCR: 650.0000 mg | EXTENDED_RELEASE_TABLET | Freq: Three times a day (TID) | ORAL | Status: DC

## 2013-05-18 MED ORDER — ALPRAZOLAM 0.25 MG PO TABS: 0.2500 mg | ORAL_TABLET | Freq: Two times a day (BID) | ORAL | Status: DC

## 2013-05-18 MED ORDER — RIVAROXABAN 20 MG PO TABS: 20.0000 mg | ORAL_TABLET | Freq: Every day | ORAL | Status: DC

## 2013-05-18 NOTE — Assessment & Plan Note (Signed)
Apparent cause of weakness in left hand and arm

## 2013-05-18 NOTE — Assessment & Plan Note (Signed)
Back on Xarelto and aspirin

## 2013-05-18 NOTE — Assessment & Plan Note (Signed)
No bone exposure palpable. Slight undermining, but no sign of fistula

## 2013-05-18 NOTE — Assessment & Plan Note (Signed)
She has intermittent suprapubic pain which may represent bladder spasm

## 2013-05-18 NOTE — Progress Notes (Signed)
  Subjective:    Patient ID: Krista [name], female    DOB: 02-18-1956, 57 y.o.   MRN: [id number]  HPI Pain - currently in legs. history of low back pain with injury during rectal sex last winter. 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 Intermittent suprapubic pain, but no current CVA pain  Generalized weakness - Had limited ambulation before her recent illness.   Right arm weakness - since her [facility] stay.   Appetite - now good, but not for meat.   Indwelling foley - replaced yesterday for staff when realized it is chronic for treatment of her sacral decubitus ulcer. The urine has been dark red. No fever has been reported. Her Xarelto dose has been at the acute treatment level Review of Systems     Objective:   Physical Exam  Constitutional:  Chronically ill appearing   Cardiovascular: Normal rate and regular rhythm.   Murmur heard. G1/6 SEM LLSB  Pulmonary/Chest: Effort normal and breath sounds normal. She has no rales.  Abdominal: Soft. Bowel sounds are normal. She exhibits no distension and no mass. There is no tenderness. There is no rebound.  Epigastric PEG tube  Genitourinary:  Foley draining dark red urine  Musculoskeletal: She exhibits no edema.  Decreased rotation, particularly internal right hip DJD changes of both knees with possible effusion on right, but not warm right knee lacks 30 degrees of extension and left knee lacks 20  Neurological: She is alert.  Pleasant and cooperative R facial weakness and tongue deviates to that side Weakness in the right arm and particularly the hand except for good wrist dorsiflexion. Claw hand posture Both legs weak such that she can't lift them off the bed   Skin:  10 x 11 cm stage 3 sacral sore with minimal undermining and no sign of fistula.          Assessment & Plan:

## 2013-05-18 NOTE — Assessment & Plan Note (Signed)
Check CBC 

## 2013-05-18 NOTE — Assessment & Plan Note (Addendum)
Will schedule .acetamionophen and add prn Tramadol

## 2013-05-18 NOTE — Telephone Encounter (Signed)
Written and attached to fax to send to [facility]  

## 2013-05-18 NOTE — Assessment & Plan Note (Signed)
Check Vitamin D level 

## 2013-05-18 NOTE — Assessment & Plan Note (Addendum)
She will be colonized while indwelling foley. Her higher dose of Xarelto was decreased and aspirin stopped

## 2013-05-18 NOTE — Assessment & Plan Note (Signed)
Now eating well, will check albumin and prealbumin

## 2013-05-18 NOTE — Assessment & Plan Note (Signed)
well controlled  

## 2013-05-23 ENCOUNTER — Encounter (HOSPITAL_COMMUNITY): Payer: Self-pay | Admitting: Nurse Practitioner

## 2013-05-23 ENCOUNTER — Emergency Department (HOSPITAL_COMMUNITY): Payer: Medicare Other

## 2013-05-23 ENCOUNTER — Emergency Department (HOSPITAL_COMMUNITY)
Admission: EM | Admit: 2013-05-23 | Discharge: 2013-05-23 | Disposition: A | Discharge status: Home / Self Care | Payer: Medicare Other | Attending: Emergency Medicine | Admitting: Emergency Medicine

## 2013-05-23 ENCOUNTER — Telehealth: Payer: Self-pay | Admitting: Family Medicine

## 2013-05-23 ENCOUNTER — Non-Acute Institutional Stay: Payer: Medicare Other | Admitting: Family Medicine

## 2013-05-23 DIAGNOSIS — IMO0002 Reserved for concepts with insufficient information to code with codable children: Secondary | ICD-10-CM | POA: Insufficient documentation

## 2013-05-23 DIAGNOSIS — S300XXA Contusion of lower back and pelvis, initial encounter: Secondary | ICD-10-CM

## 2013-05-23 DIAGNOSIS — S20229A Contusion of unspecified back wall of thorax, initial encounter: Secondary | ICD-10-CM | POA: Insufficient documentation

## 2013-05-23 DIAGNOSIS — S0990XA Unspecified injury of head, initial encounter: Secondary | ICD-10-CM

## 2013-05-23 DIAGNOSIS — E119 Type 2 diabetes mellitus without complications: Secondary | ICD-10-CM | POA: Insufficient documentation

## 2013-05-23 DIAGNOSIS — Z8659 Personal history of other mental and behavioral disorders: Secondary | ICD-10-CM | POA: Insufficient documentation

## 2013-05-23 DIAGNOSIS — S0083XA Contusion of other part of head, initial encounter: Secondary | ICD-10-CM | POA: Insufficient documentation

## 2013-05-23 DIAGNOSIS — Z8739 Personal history of other diseases of the musculoskeletal system and connective tissue: Secondary | ICD-10-CM | POA: Insufficient documentation

## 2013-05-23 DIAGNOSIS — E785 Hyperlipidemia, unspecified: Secondary | ICD-10-CM | POA: Insufficient documentation

## 2013-05-23 DIAGNOSIS — Z87891 Personal history of nicotine dependence: Secondary | ICD-10-CM | POA: Insufficient documentation

## 2013-05-23 DIAGNOSIS — Z794 Long term (current) use of insulin: Secondary | ICD-10-CM | POA: Insufficient documentation

## 2013-05-23 DIAGNOSIS — Y939 Activity, unspecified: Secondary | ICD-10-CM | POA: Insufficient documentation

## 2013-05-23 DIAGNOSIS — S161XXA Strain of muscle, fascia and tendon at neck level, initial encounter: Secondary | ICD-10-CM

## 2013-05-23 DIAGNOSIS — S0003XA Contusion of scalp, initial encounter: Secondary | ICD-10-CM | POA: Insufficient documentation

## 2013-05-23 DIAGNOSIS — W06XXXA Fall from bed, initial encounter: Secondary | ICD-10-CM | POA: Insufficient documentation

## 2013-05-23 DIAGNOSIS — Y929 Unspecified place or not applicable: Secondary | ICD-10-CM | POA: Insufficient documentation

## 2013-05-23 DIAGNOSIS — W19XXXA Unspecified fall, initial encounter: Secondary | ICD-10-CM

## 2013-05-23 DIAGNOSIS — Z79899 Other long term (current) drug therapy: Secondary | ICD-10-CM | POA: Insufficient documentation

## 2013-05-23 DIAGNOSIS — I10 Essential (primary) hypertension: Secondary | ICD-10-CM | POA: Insufficient documentation

## 2013-05-23 DIAGNOSIS — S139XXA Sprain of joints and ligaments of unspecified parts of neck, initial encounter: Secondary | ICD-10-CM | POA: Insufficient documentation

## 2013-05-23 DIAGNOSIS — K219 Gastro-esophageal reflux disease without esophagitis: Secondary | ICD-10-CM | POA: Insufficient documentation

## 2013-05-23 NOTE — ED Provider Notes (Signed)
History    CSN: [id number] Arrival date & time 05/23/13  1146  First MD Initiated Contact with Patient 05/23/13 1147     Chief Complaint  Patient presents with  . Fall   (Consider location/radiation/quality/duration/timing/severity/associated sxs/prior Treatment) HPI Comments: Patient a resident of [facility].  Tried to get out of bed this morning and fell onto the floor.  She struck her head but denies any loc or neck pain.  She does admit to headache and there is swelling to the left forehead.  She is on xarelto.  She is also complaining of pain in the lower back.    Patient is a 57 y.o. female presenting with fall. The history is provided by the patient.  Fall This is a new problem. The current episode started 1 to 2 hours ago. The problem occurs constantly. The problem has been gradually worsening. Associated symptoms include headaches. Associated symptoms comments: Back pain . The symptoms are aggravated by bending. Nothing relieves the symptoms. She has tried nothing for the symptoms.   Past Medical History  Diagnosis Date  . Arthritis   . Hyperlipidemia   . Hypertension   . Allergy   . GERD (gastroesophageal reflux disease)   . Back pain   . Type II diabetes mellitus   . Schizophrenia     /notes 02/17/2013   Past Surgical History  Procedure Laterality Date  . Tubal ligation     Family History  Problem Relation Age of Onset  . Diabetes Mother   . COPD Mother   . Heart disease Mother   . Hyperlipidemia Mother   . Hypertension Mother   . Arthritis Mother    History  Substance Use Topics  . Smoking status: Former Smoker -- 1.00 packs/day    Types: Cigarettes  . Smokeless tobacco: Never Used  . Alcohol Use: No   OB History   Grav Para Term Preterm Abortions TAB SAB Ect Mult Living                 Review of Systems  Neurological: Positive for headaches.  All other systems reviewed and are negative.    Allergies  Codeine  Home Medications    Current Outpatient Rx  Name  Route  Sig  Dispense  Refill  . acetaminophen (TYLENOL) 650 MG CR tablet   Oral   Take 1 tablet (650 mg total) by mouth 3 (three) times daily.         Mar[place] Kitchen ALPRAZolam (XANAX) 0.25 MG tablet   Oral   Take 1 tablet (0.25 mg total) by mouth 2 (two) times daily.   30 tablet   3   . budesonide (PULMICORT) 0.25 MG/2ML nebulizer solution   Nebulization   Take 0.25 mg by nebulization 2 (two) times daily.         . Calcium Carb-Cholecalciferol (CALCIUM-VITAMIN D) 600-400 MG-UNIT TABS   Oral   Take 1 tablet by mouth 2 (two) times daily.         . cholestyramine (QUESTRAN) 4 G packet   Oral   Take 1 packet by mouth 2 (two) times daily with a meal.         . diltiazem (TIAZAC) 360 MG 24 hr capsule   Oral   Take 360 mg by mouth daily.         . famotidine (PEPCID) 40 MG tablet   Oral   Take 40 mg by mouth at bedtime.         Marland Kitchen  fluPHENAZine decanoate (PROLIXIN) 25 MG/ML injection   Intramuscular   Inject 1 mL (25 mg total) into the muscle every 14 (fourteen) days.   5 mL      . furosemide (LASIX) 40 MG tablet   Oral   Take 40 mg by mouth daily.         . insulin glargine (LANTUS) 100 UNIT/ML injection   Subcutaneous   Inject 18 Units into the skin at bedtime.         Marland Kitchen ipratropium-albuterol (DUONEB) 0.5-2.5 (3) MG/3ML SOLN   Nebulization   Take 3 mLs by nebulization 3 (three) times daily.         Marland Kitchen lidocaine (LIDODERM) 5 %   Transdermal   Place 1 patch onto the skin daily. Remove & Discard patch within 12 hours or as directed by MD         . pravastatin (PRAVACHOL) 40 MG tablet   Oral   Take 40 mg by mouth daily.         . Rivaroxaban (XARELTO) 20 MG TABS   Oral   Take 1 tablet (20 mg total) by mouth daily.   30 tablet      . traMADol (ULTRAM) 50 MG tablet   Oral   Take 1 tablet (50 mg total) by mouth every 8 (eight) hours as needed for pain.   30 tablet   0    LMP 12/31/2009 Physical Exam  Nursing note and  vitals reviewed. Constitutional: She is oriented to person, place, and time.  Patient is a chronically-ill appearing and debilitated 57 year old female in no distress.   HENT:  Mouth/Throat: Oropharynx is clear and moist.  There is a contusion and ecchymotic area to the left forehead.  Eyes: EOM are normal.  The left pupil is sluggishly reactive and is slightly larger than the right.    Neck: Normal range of motion. Neck supple.  Cardiovascular: Normal rate, regular rhythm and normal heart sounds.   No murmur heard. Pulmonary/Chest: Effort normal and breath sounds normal. No respiratory distress. She has no wheezes.  Abdominal: Soft. Bowel sounds are normal. She exhibits no distension. There is no tenderness.  Musculoskeletal: Normal range of motion. She exhibits no edema.  Neurological: She is alert and oriented to person, place, and time. She exhibits normal muscle tone. Coordination normal.  Other than the pupil as described in the eye exam, the neurological exam is non-focal.    Skin: Skin is warm and dry.    ED Course  Procedures (including critical care time) Labs Reviewed - No data to display No results found. No diagnosis found.  MDM  Imaging studies negative, patient requesting to go back to the ecf.  Will discharge.  Return prn.  Geoffery Lyons, MD 05/23/13 1351

## 2013-05-23 NOTE — ED Notes (Signed)
Report called to [facility] rehab to Nurse [place].

## 2013-05-23 NOTE — ED Notes (Addendum)
Per ems: pt from [facility], fell trying to get OOB into w/c this am. Landed on floor on L side. After the fall staff reports L pupil sluggish and hematoma to L forehead so sent to ER for possible CT scan. En route, VSS and A&Ox4.

## 2013-05-23 NOTE — Telephone Encounter (Addendum)
Received call from nursing home that patient had fallen out of bed. Stated patient hit her head while falling and now had an egg sized bump on the left temple. Nursing also noted that the patients pupils were unequal. She remained alert and continued to state she wanted her ice cream.  I went to [facility] to see the patient. Per her most recent notes her pupils are typically equal in size. The patient was laying in bed and stated that she fell while trying to reach out to grab her ice cream. She fell on her head and now her head hurts. Per the patients nurse she was at her base line neurologically with the exception of the change in her pupils.  On exam she had an approximate 2x3 cm swollen echymotic lesion just superior to her left temple, this was soft and mildly tender to palpation. Her tongue deviated to the left. Her left pupil was about 6 mm and the right was about 4 mm. The left was sluggishly reactive and the right was briskly reactive. There was no noticeable ptosis. Her other cranial nerves were intact. Strength in her RUE 4/5, LUE 4+/5, bilateral LE 2/5. Her speech was easily understandable with no apparent slurring.  Patient is a 57 yo female with history of CVA resulting in residual right sided weakness who fell this morning and hit her head. Her neurological exam appears to be at baseline with the exception of anisocoria. 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 I discussed these findings with the nursing staff and advised that if she were to have a change in mental status, become decreasingly responsive, or have a change on her neurological exam they should page me and likely the patient would need to go to the ED for a CT scan of her head.  Additionally received a page a short time after seeing the patient that her CBG  was >500. Patient is on lantus 18 u daily. Ordered novolog 10 u to be given and advised nursing to call back if still elevated when rechecked an hour later.  Marikay Alar, MD PGY2 [facility]

## 2013-05-23 NOTE — Assessment & Plan Note (Signed)
Since I can't find documentation for prior anisocoria and she is on Xarelto, I ordered her sent to the ER for consideration of head CT and also for knee xrays if the EDP agrees.

## 2013-05-23 NOTE — Progress Notes (Signed)
  Subjective:    Patient ID: Krista [name], female    DOB: 01-Jan-1956, 57 y.o.   MRN: [id number]  HPI [name] fell and hit her left temple at 02:44 AM and was seen by Dr Krista [name] who found an enlarged but reactive left pupil and a left temple hematoma and now other new neurologic signs. The staff had also documented an abrasion to the left knee and left hand. On my visit at 10:30 AM, I examined her in OT where she was participating in therapy. He only complaint to the therapist has been about her chronic low back pain. She denied to me that she has a current headache and says that she ate a full breakfast and is not nauseated.    Review of Systems     Objective:   Physical Exam  Musculoskeletal:  Both knees lack full extension,medication patch over left lateral knee. right knee has more effusion and warmth than on my exam last week.   Neurological: She is alert. A cranial nerve deficit is present.  Orobuccalingual dyskinesia which she had previously  left facial droop right hemiparesis with inability to shrug right shoulder and right claw hand type contracture. Fair strength flexing and extending the right elbow.   Skin:  Tender left temple hematoma 3 x 5 cm approximately  Psychiatric:  Appropriate responses, but strange affect          Assessment & Plan:

## 2013-05-23 NOTE — ED Notes (Signed)
[facility] arrived.  

## 2013-05-23 NOTE — ED Notes (Signed)
Called [facility] for transport.  

## 2013-05-23 NOTE — ED Notes (Signed)
Returned from [facility] and [employer]

## 2013-05-23 NOTE — ED Notes (Signed)
Call for transport back to nursing home

## 2013-05-27 ENCOUNTER — Encounter: Payer: Self-pay | Admitting: Family Medicine

## 2013-06-10 LAB — HEMOGLOBIN A1C
Cholesterol: 63 mg/dL (ref 0–200)
Potassium: 3.4 mmol/L
Sodium: 136 mmol/L — AB (ref 137–147)
Total Protein: 4.9 g/dL
Vit D, 25-Hydroxy: 34

## 2013-06-15 ENCOUNTER — Other Ambulatory Visit: Payer: Self-pay | Admitting: Family Medicine

## 2013-06-15 MED ORDER — JUVEN PO PACK: 1.0000 | PACK | Freq: Two times a day (BID) | ORAL | Status: AC

## 2013-06-15 MED ORDER — PRO-STAT SUGAR FREE PO LIQD: 30.0000 mL | Freq: Two times a day (BID) | ORAL | Status: DC

## 2013-06-21 ENCOUNTER — Encounter: Payer: Self-pay | Admitting: Pharmacist

## 2013-06-21 NOTE — Progress Notes (Signed)
Patient ID: Krista [name], female   DOB: May 27, 1956, 57 y.o.   MRN: [id number] Reviewed nursing home medication list.

## 2013-06-22 ENCOUNTER — Non-Acute Institutional Stay: Payer: Medicare Other | Admitting: Family Medicine

## 2013-06-22 ENCOUNTER — Encounter: Payer: Self-pay | Admitting: Family Medicine

## 2013-06-22 DIAGNOSIS — E43 Unspecified severe protein-calorie malnutrition: Secondary | ICD-10-CM

## 2013-06-22 DIAGNOSIS — L89153 Pressure ulcer of sacral region, stage 3: Secondary | ICD-10-CM

## 2013-06-22 DIAGNOSIS — Z9889 Other specified postprocedural states: Secondary | ICD-10-CM

## 2013-06-22 DIAGNOSIS — E1139 Type 2 diabetes mellitus with other diabetic ophthalmic complication: Secondary | ICD-10-CM

## 2013-06-22 DIAGNOSIS — Z931 Gastrostomy status: Secondary | ICD-10-CM

## 2013-06-22 DIAGNOSIS — I82409 Acute embolism and thrombosis of unspecified deep veins of unspecified lower extremity: Secondary | ICD-10-CM

## 2013-06-22 DIAGNOSIS — Z86718 Personal history of other venous thrombosis and embolism: Secondary | ICD-10-CM | POA: Insufficient documentation

## 2013-06-22 DIAGNOSIS — L8993 Pressure ulcer of unspecified site, stage 3: Secondary | ICD-10-CM

## 2013-06-22 DIAGNOSIS — L89109 Pressure ulcer of unspecified part of back, unspecified stage: Secondary | ICD-10-CM

## 2013-06-22 DIAGNOSIS — F17200 Nicotine dependence, unspecified, uncomplicated: Secondary | ICD-10-CM

## 2013-06-22 DIAGNOSIS — I82401 Acute embolism and thrombosis of unspecified deep veins of right lower extremity: Secondary | ICD-10-CM

## 2013-06-22 DIAGNOSIS — Z87891 Personal history of nicotine dependence: Secondary | ICD-10-CM

## 2013-06-22 HISTORY — DX: Gastrostomy status: Z93.1

## 2013-06-22 MED ORDER — INSULIN GLARGINE 100 UNIT/ML ~~LOC~~ SOLN: 20.0000 [IU] | Freq: Every day | SUBCUTANEOUS | Status: DC

## 2013-06-22 NOTE — Assessment & Plan Note (Signed)
Ordered foley change if not already done since admission.

## 2013-06-22 NOTE — Assessment & Plan Note (Signed)
Gaining weight with improved nutritional intake.   Continue current diet.

## 2013-06-22 NOTE — Assessment & Plan Note (Signed)
Well controlled  Decreased lantus to 20 u in AM Continue SSI novolog.

## 2013-06-22 NOTE — Progress Notes (Signed)
  Subjective:    Patient ID: Krista [name], female    DOB: 1956-10-23, 57 y.o.   MRN: [id number]  HPI 57 yo F presents for well NH patient seen for routine visit attending. She has no complaints other than a lack of family support that is upsetting.  1. Sacral decubitus ulcer: denies wound pain. Receiving wound care. No fever.   2. DM2: compliant with insulin. Having CBGs checked. Eating well and gaining weight.  3. Protein calorie malnutrition: eating well and gaining weight. Happy with current weight. Reports ideal weight is 150 #. Still has gastromy tube in place, placed 04/08/2013 during prolonged hospitalization.   4. Edema: taking lasix for history of edema while hospitalized. No history of CHF, EF 50% on 03/11/13. Denies leg swelling currently. Denies DOE or orthopnea.   5. Chronic indwelling foley: foley in place for sacral decubitus ulcer and history of urinary incontinence. Patient denies suprapubic pain. No recent hematuria.   Review of Systems  Constitutional: Negative for fever, activity change, appetite change, fatigue and unexpected weight change.  Respiratory: Negative for shortness of breath.   Cardiovascular: Negative for chest pain, palpitations and leg swelling.  Gastrointestinal: Negative for nausea, vomiting, abdominal pain and diarrhea.  Musculoskeletal: Positive for back pain. Negative for joint swelling.  Neurological: Negative for headaches.      Objective: BP 119/76  Pulse 111  Temp(Src) 99.7 F (37.6 C)  Resp 20  Wt 160 lb 3.2 oz (72.666 kg)  BMI 26.69 kg/m2  LMP 12/31/2009 Wt Readings from Last 3 Encounters:  06/15/13 160 lb 3.2 oz (72.666 kg)  03/16/13 208 lb 1.8 oz (94.4 kg)  02/22/13 150 lb 5.7 oz (68.2 kg)      Physical Exam  Constitutional: She appears well-developed and well-nourished. No distress.  Cardiovascular: Normal rate, regular rhythm and normal heart sounds.   No murmur heard. Pulmonary/Chest: Effort normal. No respiratory  distress. She has no wheezes. She has no rales.  Abdominal: Soft. Bowel sounds are normal. She exhibits no distension. There is no tenderness.  PEG tube in place. Minimal erythema around insertion site. No drainage.   Genitourinary:  Foley catheter in place   Musculoskeletal: She exhibits no edema.  Neurological: She is alert.  Skin: Skin is warm and dry.  Sacral decubitus ulcer:   down to subcutaneous fat. No undermining. Tissue pink and moist. Scattered areas of yellow exudate.   Gauze and DuoDerm over wound.    Lab Results  Component Value Date   HGBA1C 5.7 06/10/2013   Recent CBGs:  Fasting range 91-129 Post prandial range 151-209    Assessment & Plan:

## 2013-06-22 NOTE — Assessment & Plan Note (Signed)
Tube in place. Patient is no longer dependent on tube. Tube was placed on 04/08/2013 by IR at [facility].   Put in a call to IR regarding removal of G-tube. The patient is progressing well and I anticipate she will not return to  G-tube dependence any time soon.

## 2013-06-22 NOTE — Assessment & Plan Note (Signed)
No bone exposure or fistula. No longer undermining. Overall improving with wound care and improved nutrition.  Continue current regimen

## 2013-06-30 ENCOUNTER — Other Ambulatory Visit: Payer: Self-pay | Admitting: Family Medicine

## 2013-06-30 MED ORDER — TRAMADOL HCL 50 MG PO TABS: 50.0000 mg | ORAL_TABLET | Freq: Three times a day (TID) | ORAL | Status: DC | PRN

## 2013-06-30 NOTE — Telephone Encounter (Signed)
Refilled med in response to [facility]ed [facility] [place]  

## 2013-07-26 ENCOUNTER — Encounter: Payer: Self-pay | Admitting: Student-PharmD

## 2013-08-03 ENCOUNTER — Other Ambulatory Visit: Payer: Self-pay | Admitting: Pharmacist

## 2013-08-03 MED ORDER — RIVAROXABAN 20 MG PO TABS: 20.0000 mg | ORAL_TABLET | Freq: Every day | ORAL | Status: AC

## 2013-08-18 ENCOUNTER — Non-Acute Institutional Stay: Payer: Medicare Other | Admitting: Family Medicine

## 2013-08-18 DIAGNOSIS — R748 Abnormal levels of other serum enzymes: Secondary | ICD-10-CM

## 2013-08-18 DIAGNOSIS — R531 Weakness: Secondary | ICD-10-CM

## 2013-08-18 DIAGNOSIS — Z931 Gastrostomy status: Secondary | ICD-10-CM

## 2013-08-18 DIAGNOSIS — R5381 Other malaise: Secondary | ICD-10-CM

## 2013-08-18 DIAGNOSIS — L8993 Pressure ulcer of unspecified site, stage 3: Secondary | ICD-10-CM

## 2013-08-18 DIAGNOSIS — E43 Unspecified severe protein-calorie malnutrition: Secondary | ICD-10-CM

## 2013-08-18 DIAGNOSIS — L89153 Pressure ulcer of sacral region, stage 3: Secondary | ICD-10-CM

## 2013-08-18 DIAGNOSIS — Z9889 Other specified postprocedural states: Secondary | ICD-10-CM

## 2013-08-18 DIAGNOSIS — L89109 Pressure ulcer of unspecified part of back, unspecified stage: Secondary | ICD-10-CM

## 2013-08-18 HISTORY — DX: Abnormal levels of other serum enzymes: R74.8

## 2013-08-18 NOTE — Assessment & Plan Note (Addendum)
Wound is currently mostly stage III with tunneling consistent with stage IV. She was seen and evaluated by the wound physician today who elected to continue local wound management. Overall the wound appears to be improving. Patient is afebrile without elevated WBC. No clinical signs of osteomyelitis.

## 2013-08-18 NOTE — Progress Notes (Addendum)
  Subjective:    Patient ID: Krista [name], female    DOB: [birth year], 57 y.o.   MRN: [id number]  HPI 57 year old female with past medical history of major depressive disorder, CVA, hypertension, history of DVT, large stage IV decubitus ulcer seen and evaluated at [facility] nursing home.  Patient is without complaint today, states that she feels well, tolerating her diet, having regular bowel movements, no chest pain, no shortness of breath, no abdominal pain  Stage 4 decubitus ulcer-patient was seen and evaluated by the wound care physician earlier this morning who noted improvement of the ulceration, she continues to have tunneling down to the level of the sacrum however the wound does appear to be improving in general  History of DVT-patient is currently on Xarelto.  She is currently without any shortness of breath. Not requiring any supplemental oxygen.  Schizoaffective-she is currently on Xanax. Patient is in good spirits and states her mood is good.  Gastrostomy tube-patient is currently taking by mouth, she is no longer using the gastrostomy tube for medications or feeds, patient is wondering if she can have this removed     Review of Systems Review of Systems  Constitutional: Negative for fever, chills and fatigue.  Respiratory: Negative for cough and shortness of breath.   Gastrointestinal: Negative for nausea, diarrhea, constipation and blood in stool.  Skin: Positive for wound.  Psychiatric/Behavioral: Negative for suicidal ideas.      Objective:   Physical Exam Vitals: Reviewed at nursing home General: Pleasant female, laying in hospital bed, no acute distress, in good spirits Cardiac: Regular in rhythm, S1 and S2 present, no murmurs, no heaves or thrills Respiratory: Clear to auscultation bilaterally, no wheezes, good effort Abdomen: Soft, nontender, feeding tube in place Skin: Stage III sacral decubitus ulcer with tunneling to the sacrum consistent with stage IV  ulceration, the wound appears to be healing well, no evidence of infection, minimal drainage Extremities: No edema, no calf tenderness  Laboratories reviewed from 08/10/2013. CBC showed WBC of 8.4, hemoglobin of 11.3, hematocrit 34.1, platelets of 434. BNP showed sodium of 138, potassium 3.9, chloride 104, bicarbonate 25, BUN 10, creatinine 0.66, glucose of 112, calcium of 9.3. LFTs are all within normal limits except for alkaline phosphatase of 393 and albumin of 3.0. LDL of 39        Assessment & Plan:  Please see problem specific assessment and plan.

## 2013-08-18 NOTE — Assessment & Plan Note (Signed)
Patient has elevated alkaline phosphatase level that is improved from previous lab work completed one month ago. This elevation is thought to be secondary to bone involvement from stage IV decubitus ulcer. Patient has no GI side effects. Will repeat alkaline phosphatase in one month.

## 2013-08-18 NOTE — Assessment & Plan Note (Signed)
Patient has not been working with physical therapy. Will reconsult as patient is still confined to hospital bed and wheelchair.

## 2013-08-18 NOTE — Assessment & Plan Note (Signed)
Patient is no longer using gastrostomy tube for feeds or medications. -She will be referred to surgery for possible removal.

## 2013-08-18 NOTE — Assessment & Plan Note (Addendum)
Albumin is improving with improved dietary intake. -Continue current management

## 2013-08-19 ENCOUNTER — Other Ambulatory Visit: Payer: Self-pay | Admitting: Family Medicine

## 2013-08-19 MED ORDER — ALPRAZOLAM 0.25 MG PO TABS: 0.2500 mg | ORAL_TABLET | Freq: Two times a day (BID) | ORAL | Status: DC

## 2013-08-22 ENCOUNTER — Encounter: Payer: Self-pay | Admitting: Pharmacist

## 2013-08-31 ENCOUNTER — Encounter: Payer: Self-pay | Admitting: Pharmacist

## 2013-09-01 ENCOUNTER — Non-Acute Institutional Stay (INDEPENDENT_AMBULATORY_CARE_PROVIDER_SITE_OTHER): Payer: Medicare Other | Admitting: Family Medicine

## 2013-09-01 DIAGNOSIS — L89109 Pressure ulcer of unspecified part of back, unspecified stage: Secondary | ICD-10-CM

## 2013-09-01 DIAGNOSIS — R05 Cough: Secondary | ICD-10-CM

## 2013-09-01 DIAGNOSIS — L8993 Pressure ulcer of unspecified site, stage 3: Secondary | ICD-10-CM

## 2013-09-01 DIAGNOSIS — Z931 Gastrostomy status: Secondary | ICD-10-CM

## 2013-09-01 DIAGNOSIS — Z9889 Other specified postprocedural states: Secondary | ICD-10-CM

## 2013-09-01 DIAGNOSIS — L89153 Pressure ulcer of sacral region, stage 3: Secondary | ICD-10-CM

## 2013-09-01 NOTE — Progress Notes (Signed)
Patient ID: Krista [name], female   DOB: [birth year], 57 y.o.   MRN: [id number] Subjective:   CC: Meet new MD, sacral decub, unused G-tube, cough  HPI:   1. Sacral decubitus ulcer - Patient reports this is still somewhat painful but appears to be laying in bed comfortably. Denies fevers or chills. On discussion with nurse, pt gets daily silver alginate dressing changes and weekly wound care MD visit.  2. Unused G-tube - Patient eats and does not use this tube and would like it out. Per nurse, dressing around G-tube gets changed daily.  3. Cough - Pt has had about 1 week of cough with whitish yellow sputum production. Denies SOB, fevers, chills, chest pain, or other concerns about this other than the discomfort.   Review of Systems - Per HPI. Additionally, doing well and in good spirits. Reports eating breakfast today, getting around with wheelchair today, and good appetite. Does also complain of left foot numbness compared to other leg x 2-3 days but denies weakness.  PMH: Past Medical History  Diagnosis Date  . Arthritis   . Hyperlipidemia   . Hypertension   . Allergy   . GERD (gastroesophageal reflux disease)   . Back pain   . Type II diabetes mellitus   . Schizophrenia     Krista [name] 02/17/2013  . 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    RLE DVT   Anemia of chronic disease H/o DVT Degenerative joint disease Type II Diabetes Chronic indwelling foley catheter CVA hx    Objective:  Physical Exam LMP 12/31/2009 VS: 98.9F, 102bpm, 20 resp, 137/90 (prior 118-130s/81, 160/89), 96% on room air, BG 157-180s GEN: NAD, lying in bed, pleasant HEENT: AT/[place], Missing multiple teeth, o/p clear, sclera clear, EOMI, PERRL CV: RRR, no m/r/g PULM: Occasional wet-sounding light cough, coarse breath sounds throughout, normal work of breathing ABD: Soft, nontender, nondistended, NABS, g-tube in place with no erythema, induration, or exudate. Non-tender. Dressing in place that has small amount of serosanguinous  dry material on it but is otherwise dry and intact GU: Foley catheter in place draining clear yellow urine. Wearing depends. SKIN: Sacrum with 7x5cm shallow stage 3-4 wet-appearing ulcer with healed skin around (which used to be ulcer) and small tunneled area that per nursing is 0.8cm deep at around 2 o'clock position.  NEURO: Awake, alert, oriented x 3, CN 2-12 tested and intact, normal speech. EXTR: Bilateral LE with no edema or calf tenderness and are equal in size; bilateral feet able to wiggle toes and with 1+ bilateral DP pulses; left foot appears intact. decreased sensation compared to right foot. Left toenails hyertrophic and mildly yellowed. Skin intact.    Assessment:     Krista [name] is a 58 y.o. female at [facility] being evaluated for her sacral decubitus ulcer, old G-tube, and cough.    Plan:     # See problem list and after visit summary for problem-specific plans. - Of note, pt complained of left foot numbness at end of visit. Neurovascularly intact. may need to further eval if complaints about this continue. - At f/u visit, will eval health maintenance issues  Krista Singleton, MD 09/01/2013 6:02 PM

## 2013-09-01 NOTE — Assessment & Plan Note (Signed)
Unused, no current complications. Does not appear infected. - Daily dressing changes - Planned surgery evaluation to remove, pt is amenable

## 2013-09-01 NOTE — Assessment & Plan Note (Signed)
Approx 1 week, no fevers or chills, likely viral URI vs post-viral - Continue robitussin - Consider mucinex - Up to wheelchair daily - pt on board - Would like to do pulmonary toilet but per nursing it will not happen

## 2013-09-01 NOTE — Assessment & Plan Note (Signed)
Improving. Stage 3-4 with unstageable small tunneled region at approx 2 o'clock. - Continue daily silver alginate dressing changes - MD to eval weekly; yesterday, performed light debridement at bedside per nursing - Pt up to wheelchair occasionally

## 2013-09-08 ENCOUNTER — Non-Acute Institutional Stay (INDEPENDENT_AMBULATORY_CARE_PROVIDER_SITE_OTHER): Payer: Medicare Other | Admitting: Family Medicine

## 2013-09-08 DIAGNOSIS — R05 Cough: Secondary | ICD-10-CM

## 2013-09-08 NOTE — Progress Notes (Signed)
Patient ID: Krista [name], female   DOB: August 29, 1956, 57 y.o.   MRN: [id number]  [facility] Visit Krista Absher M. Omri Bertran, MD Phone: [phone]   Subjective: HPI: Patient is a 57 y.o. female resident at [facility] with progressively worsening cough x2 weeks. States she coughs up yellow/green sputum and feels more congested. Tried Robitussin but no relief. No history of asthma. VSS (no O2 requirement or fevers.)  Objective: Nursing home vital signs reviewed. LMP 12/31/2009  Physical Examination:  General: Awake, alert. NAD. In bed. HEENT: Atraumatic, normocephalic Pulm: Diffuse expiratory wheezes and rales anterior ascultation. Wet sounding cough.  Assessment: 57 y.o. female with cough  Plan: See Problem List and After Visit Summary

## 2013-09-08 NOTE — Assessment & Plan Note (Signed)
Cough is getting progressively worse. Will get 2 view CXR. Continue to monitor vitals and give Robitussin per protocol. Will re-assess as needed.

## 2013-09-13 ENCOUNTER — Telehealth: Payer: Self-pay | Admitting: Family Medicine

## 2013-09-13 NOTE — Telephone Encounter (Signed)
Will pose question to nursing home team - I believe she is in the nursing home indefinitely due to proven difficulty caring for self / taking psych meds in the past, but may be incorrect.  Krista Singleton, MD 09/13/2013 1:22 PM

## 2013-09-13 NOTE — Telephone Encounter (Signed)
Will discuss with team on rounds tomorrow and let you know.  Thank you for bringing this to our attention.  Aariel Ems M. Aliannah Holstrom, M.D.

## 2013-09-13 NOTE — Telephone Encounter (Signed)
Pt called and would like Dr T to tell her when she can go home from [place]. She would like the doctor to call her at [facility]. JW

## 2013-09-19 ENCOUNTER — Other Ambulatory Visit: Payer: Self-pay | Admitting: Family Medicine

## 2013-09-19 MED ORDER — ALPRAZOLAM 0.25 MG PO TABS: 0.2500 mg | ORAL_TABLET | Freq: Two times a day (BID) | ORAL | Status: DC

## 2013-09-19 NOTE — Telephone Encounter (Signed)
Refilled med in response to [facility]ed [facility] [place]  Refilled xanax, 60 tabs.   

## 2013-09-21 LAB — LIPID PANEL: Cholesterol: 109 mg/dL (ref 0–200)

## 2013-09-21 LAB — CBC AND DIFFERENTIAL
Platelets: 419 10*3/uL — AB (ref 150–399)
WBC: 7.8 10^3/mL

## 2013-09-21 LAB — HEPATIC FUNCTION PANEL
ALT: 14 U/L (ref 7–35)
Alkaline Phosphatase: 303 U/L — AB (ref 25–125)

## 2013-09-21 LAB — BASIC METABOLIC PANEL: Creatinine: 0.7 mg/dL (ref 0.5–1.1)

## 2013-09-22 ENCOUNTER — Encounter: Payer: Self-pay | Admitting: Emergency Medicine

## 2013-09-26 ENCOUNTER — Encounter: Payer: Self-pay | Admitting: Pharmacist

## 2013-09-29 ENCOUNTER — Non-Acute Institutional Stay (INDEPENDENT_AMBULATORY_CARE_PROVIDER_SITE_OTHER): Payer: Medicare Other | Admitting: Family Medicine

## 2013-09-29 DIAGNOSIS — R531 Weakness: Secondary | ICD-10-CM

## 2013-09-29 DIAGNOSIS — I10 Essential (primary) hypertension: Secondary | ICD-10-CM

## 2013-09-29 DIAGNOSIS — E1139 Type 2 diabetes mellitus with other diabetic ophthalmic complication: Secondary | ICD-10-CM

## 2013-09-29 DIAGNOSIS — F259 Schizoaffective disorder, unspecified: Secondary | ICD-10-CM

## 2013-09-29 DIAGNOSIS — L89153 Pressure ulcer of sacral region, stage 3: Secondary | ICD-10-CM

## 2013-09-29 DIAGNOSIS — Z931 Gastrostomy status: Secondary | ICD-10-CM

## 2013-09-29 DIAGNOSIS — R5381 Other malaise: Secondary | ICD-10-CM

## 2013-09-29 DIAGNOSIS — Z9889 Other specified postprocedural states: Secondary | ICD-10-CM

## 2013-09-29 DIAGNOSIS — R200 Anesthesia of skin: Secondary | ICD-10-CM

## 2013-09-29 DIAGNOSIS — L89109 Pressure ulcer of unspecified part of back, unspecified stage: Secondary | ICD-10-CM

## 2013-09-29 DIAGNOSIS — L8993 Pressure ulcer of unspecified site, stage 3: Secondary | ICD-10-CM

## 2013-09-29 DIAGNOSIS — R209 Unspecified disturbances of skin sensation: Secondary | ICD-10-CM

## 2013-09-29 NOTE — Progress Notes (Addendum)
Patient ID: TEKESHIA KLAHR, female   DOB: 12-Jun-1956, 57 y.o.   MRN: [id number] Subjective:   CC: Nursing home PCP visit:  HPI:   1. Deconditioning with left leg "numbness"-  Pt reports left leg numbness to mid-thigh. Per nursing, she works with PT regularly though unable to quantify time ('a few days ago').  2. Wound - Sacral decubitus ulcer. Per nursing, this continues getting better. She has dressing changes daily by wound nurses and sees wound care physician weekly.  3. Schizophrenia - Pt takes fluphenazine IM. She is not motivated to get out of bed much, though per nursing she is up to wheelchair daily.  Review of Systems - Per HPI.   PMH: - medications reviewed.    Objective:  Vital signs taken 11/25: T 97.6, P 94, RR 20, BP 129/91, O2 sat 98% on room air CBG 208-->130  Physical Exam 11/25 LMP 12/31/2009 GEN: NAD HEENT: Atraumatic, normocephalic, neck supple, EOMI, sclera clear, poor dentition CV: RRR, no murmurs, rubs, or gallops; 2+ bilateral DP pulses PULM: CTAB, normal effort, faint expiratory wheeze throughout ABD: Soft, nontender, nondistended, NABS, no organomegaly; PEG tube in place; serosanguinous drying mild drainage on PEG dressing, no induration or inflammation. SKIN: No rash or cyanosis; warm and well-perfused; sacral ulcer 3-4 cm radius with healed tissue around, 0.8cm tracking sinus within ulcer, minimal drainage. EXTR: No lower extremity edema or calf tenderness PSYCH: Mood and affect mildly flattened, normal rate and volume of speech NEURO: Awake, alert, no focal deficits grossly, normal speech, wiggles fingers and toes with normal UE and LE spontaneous purposeful movement. Sensation present but decreased to mid-thigh to gross touch.  Assessment:     KHRISTIAN SEALS is a 57 y.o. female here for nursing home visit.    Plan:     # See problem list and after visit summary for problem-specific plans.  # Health Maintenance: Not discussed - Evaluate at  f/u, including mammogram, colonoscopy, pap smear, flu shot  Follow-up: Follow up in 2 months for next nursing home visit.   Leona Singleton, MD [facility]

## 2013-10-04 DIAGNOSIS — R200 Anesthesia of skin: Secondary | ICD-10-CM | POA: Insufficient documentation

## 2013-10-04 NOTE — Assessment & Plan Note (Signed)
BP stable.  Continue diltiazem. 

## 2013-10-04 NOTE — Assessment & Plan Note (Signed)
Stable on levemir 8 BID - Continue to monitor glucose.

## 2013-10-04 NOTE — Assessment & Plan Note (Signed)
Still in place, unused, no current obvious complications. Does not appear infected. - Daily dressing changes - Planned surgery eval to remove - will ask NH team about status.

## 2013-10-04 NOTE — Assessment & Plan Note (Signed)
Stable on fluphenazine IM q14 days but still little motivation. - Ordered psychiatric referral for evaluation.  - Will check with nursing if need to order referral for psych eval in nursing home.

## 2013-10-04 NOTE — Assessment & Plan Note (Addendum)
Neurovascularly intact but this has escalated from previous left foot numbness noted 10/30; now to mid-thigh. However, on questioning, numbness does not follow typical nerve distribution (left lateral foot, left lateral thigh, normal sensation in between). DDx includes diabetic neuropathy vs sequelae from previous broken ankle. - Continue working with PT. - Monitor.

## 2013-10-04 NOTE — Assessment & Plan Note (Signed)
Pt works intermittently with PT. Deconditioning likely worsening with pt mostly lying in bed, occasionally up to wheelchair. - Nursing to ask PT to work more with patient.

## 2013-10-04 NOTE — Assessment & Plan Note (Signed)
Improving per nursing. Small tunneled region still present at 2 o'clock. - Continue daily silver alginate dressing changes. - MD eval weekly. - Pt up to wheelchair daily. Asked nursing to speak with PT to encourage OOB

## 2013-10-10 LAB — BASIC METABOLIC PANEL
BUN: 15 mg/dL (ref 4–21)
Creatinine: 0.7 mg/dL (ref 0.5–1.1)
Glucose: 139 mg/dL
Potassium: 3.5 mmol/L (ref 3.4–5.3)
Sodium: 139 mmol/L (ref 137–147)

## 2013-10-10 LAB — CBC AND DIFFERENTIAL: Hemoglobin: 12.2 g/dL (ref 12.0–16.0)

## 2013-10-10 LAB — HEPATIC FUNCTION PANEL: AST: 16 U/L (ref 13–35)

## 2013-10-12 ENCOUNTER — Non-Acute Institutional Stay: Payer: Medicare Other | Admitting: Family Medicine

## 2013-10-12 ENCOUNTER — Encounter: Payer: Self-pay | Admitting: Emergency Medicine

## 2013-10-12 DIAGNOSIS — L89109 Pressure ulcer of unspecified part of back, unspecified stage: Secondary | ICD-10-CM

## 2013-10-12 DIAGNOSIS — L89153 Pressure ulcer of sacral region, stage 3: Secondary | ICD-10-CM

## 2013-10-12 DIAGNOSIS — R5381 Other malaise: Secondary | ICD-10-CM

## 2013-10-12 DIAGNOSIS — R531 Weakness: Secondary | ICD-10-CM

## 2013-10-12 DIAGNOSIS — L8993 Pressure ulcer of unspecified site, stage 3: Secondary | ICD-10-CM

## 2013-10-12 DIAGNOSIS — I10 Essential (primary) hypertension: Secondary | ICD-10-CM

## 2013-10-12 DIAGNOSIS — E1139 Type 2 diabetes mellitus with other diabetic ophthalmic complication: Secondary | ICD-10-CM

## 2013-10-12 DIAGNOSIS — F259 Schizoaffective disorder, unspecified: Secondary | ICD-10-CM

## 2013-10-12 DIAGNOSIS — R748 Abnormal levels of other serum enzymes: Secondary | ICD-10-CM

## 2013-10-12 NOTE — Assessment & Plan Note (Signed)
Controlled on current dose of diltiazem

## 2013-10-12 NOTE — Assessment & Plan Note (Signed)
Slowly improving with local wound care. Continue frequent turns and ambulation as tolerated.

## 2013-10-12 NOTE — Assessment & Plan Note (Signed)
Patient continues to have generalized weakness which is likely secondary from prolonged bedrest. No medical cause for her to continue to not be able to ambulate on her own. -Agree with continuation of physical therapy

## 2013-10-12 NOTE — Assessment & Plan Note (Signed)
Fluphenazine recently switched to Jordan. Will monitor mood.

## 2013-10-12 NOTE — Assessment & Plan Note (Signed)
Stable on Levemir. Regular checks of A1c her PCP

## 2013-10-12 NOTE — Assessment & Plan Note (Signed)
Improved based on most recent lab work. Last alkaline phosphatase was 307 on 09/15/2013.

## 2013-10-12 NOTE — Progress Notes (Signed)
   Subjective:    Patient ID: Krista [name], female    DOB: 1956/05/30, 57 y.o.   MRN: [id number]  HPI 57 year old female with past medical history of schizoaffective disorder, sacral decubitus ulcer, type 2 diabetes, hyperlipidemia, obesity, hypertension, history of CVA, history of DVT who was seen and evaluated at [facility] nursing home on 10/12/2013.  Patient has no acute complaints. She denies no acute pain. She does have chronic left lower extremity pain in the left ankle which is unchanged from previous, she states that she is tolerating her diet well, having regular bowel movements, Foley catheter still in place  Per nursing staff no acute issues identified, sacral ulcer is healing well and she is seen regularly by wound care, no recent agitation or delirium  Schizoaffective disorder-patient was recently transitioned from fluphenazine to Jordan on 10/05/13  Review of Systems  Constitutional: Negative for fever, chills and fatigue.  Respiratory: Negative for cough, choking and shortness of breath.   Cardiovascular: Negative for chest pain.  Gastrointestinal: Negative for nausea, vomiting, abdominal pain, diarrhea and abdominal distention.  Musculoskeletal: Positive for joint swelling.       Objective:   Physical Exam Vitals: Reviewed General: Present Caucasian female, laying in hospital bed, answers questions appropriately however occasionally will have tangential thoughts HEENT: Normocephalic, pupils are equal in size, no scleral icterus, moist mucous members, neck was supple, no anterior posterior cervical lymphadenopathy Cardiac: Regular in rhythm, S1 and S2 present, no murmurs, no heaves or thrills Respiratory: Clear to auscultation bilaterally, normal effort Abdomen: Soft, nontender, normal bowel sounds, G-tube in place with mild serosanguineous drainage Extremities: No edema, 2+ radial and dorsalis pedis pulses Skin: No rash, decubitus ulcer not visualized today MSK: Mild  generalized tenderness of the left ankle without erythema or swelling, range of motion is full Neuro: Strength was 4+ out of 5 in all extremities, sensation to light touch was grossly intact GU: Foley catheter in place draining amber colored urine       Assessment & Plan:  Please see problem specific assessment and plan. Reviewed recent resident note and agree with plan as documented

## 2013-10-21 ENCOUNTER — Other Ambulatory Visit: Payer: Self-pay | Admitting: Family Medicine

## 2013-10-21 MED ORDER — ALPRAZOLAM 0.25 MG PO TABS: 0.2500 mg | ORAL_TABLET | Freq: Two times a day (BID) | ORAL | Status: DC

## 2013-10-21 NOTE — Telephone Encounter (Signed)
Faxing refill request back with paper rx for xanax 0.25 mg 1 tab PO BID #60 with 2 refills.  Leona Singleton, MD

## 2013-11-21 ENCOUNTER — Non-Acute Institutional Stay (INDEPENDENT_AMBULATORY_CARE_PROVIDER_SITE_OTHER): Payer: Medicare Other | Admitting: Family Medicine

## 2013-11-21 DIAGNOSIS — R5383 Other fatigue: Secondary | ICD-10-CM

## 2013-11-21 DIAGNOSIS — F259 Schizoaffective disorder, unspecified: Secondary | ICD-10-CM

## 2013-11-21 DIAGNOSIS — L89153 Pressure ulcer of sacral region, stage 3: Secondary | ICD-10-CM

## 2013-11-21 DIAGNOSIS — R109 Unspecified abdominal pain: Secondary | ICD-10-CM

## 2013-11-21 DIAGNOSIS — L8993 Pressure ulcer of unspecified site, stage 3: Secondary | ICD-10-CM

## 2013-11-21 DIAGNOSIS — R748 Abnormal levels of other serum enzymes: Secondary | ICD-10-CM

## 2013-11-21 DIAGNOSIS — E1139 Type 2 diabetes mellitus with other diabetic ophthalmic complication: Secondary | ICD-10-CM

## 2013-11-21 DIAGNOSIS — I10 Essential (primary) hypertension: Secondary | ICD-10-CM

## 2013-11-21 DIAGNOSIS — L89109 Pressure ulcer of unspecified part of back, unspecified stage: Secondary | ICD-10-CM

## 2013-11-21 DIAGNOSIS — Z931 Gastrostomy status: Secondary | ICD-10-CM

## 2013-11-21 DIAGNOSIS — R5381 Other malaise: Secondary | ICD-10-CM

## 2013-11-21 DIAGNOSIS — R531 Weakness: Secondary | ICD-10-CM

## 2013-11-22 ENCOUNTER — Encounter: Payer: Self-pay | Admitting: Pharmacist

## 2013-11-25 DIAGNOSIS — R109 Unspecified abdominal pain: Secondary | ICD-10-CM

## 2013-11-25 HISTORY — DX: Unspecified abdominal pain: R10.9

## 2013-11-25 NOTE — Progress Notes (Signed)
Patient ID: Krista [name], female   DOB: 1955-11-23, 58 y.o.   MRN: [id number] Subjective:   CC: Nursing home visit  HPI:   1. Hypertension - Patient takes diltiazem daily. BPs per nursing charting: 1/17 - 102/76, 1/3 - 113/76. Denies chest pain, dyspnea, nausea, emesis, blurred vision, leg swelling, headaches, dizziness, or syncope. Does not report medication side effects.   2. Diabetes Mellitus Type II - Patient taking levemir 8 BID. Denies anxiety/diaphoresis, dizziness, or other concerns with medication. BG checked 1/17 was 185, uncertain if this was fasting or not. A1c 6.3 on 10/10/13.  3. Sacral decubitus ulcer - Nursing reports it continues getting better and dressings get changed daily and PRN. Pt has no complaints about this.  4. Schizophrenia - Patient is on latuda. Nursing reports her activity mildly increased on this, and pt reports getting up out of bed in wheelchair daily but states she is unable to walk on her own.   5. G-tube - Still in place, not bothersome to patient. Per nursing, they need the name of the surgeon who placed it in order to get it out. Per chart, [facility]-calorie [facility] but has not been used since coming to [facility]. Denies pain at site.  6. Strength - Pt reports getting up daily, nursing reports PT is working with patient but is unable to tell me how frequently.  Review of Systems - Per HPI. Additionally, patient reports some abdominal pain and is unsure when her last BM was (as is nursing). Denies nausea, vomiting, and is unsure about her BMs.  PMH: Reviewed. Meds reviewed.     Objective:  Physical Exam LMP 12/31/2009 VS: Per nursing chart: 1/17: 97.82F, 83BPM, 18 resp/min, 102/76, 98% on room air, CBG 185 GEN: NAD HEENT: Atraumatic, normocephalic, neck supple, EOMI, sclera clear, very poor dentition  CV: RRR, distant-sounding PULM: CTAB, normal effort, faint wheeze throughout ABD: Soft,  nontender, nondistended, NABS, no organomegaly, G-tube in place with dressing clean, entry of G-tube clean, dry, intact, non-erythematous, no purulence or induration; Right side healing bruising present SKIN: No rash or cyanosis; warm and well-perfused EXTR: No lower extremity edema or calf tenderness; normal bilateral LE sensation to gross touch, 3/5 bilateral LE strength (hip flexion, knee extension)  PSYCH: Mood and affect euthymic, normal rate and volume of speech; does not seem to be responding to internal stimuli. NEURO: Awake, alert, no focal deficits grossly, normal speech, PERRL, EOMI, alert and oriented x 3 GU: Foley in place  Most recent labs:   12/8:  WBC 6.1 Hgb 12.2 HCT 35.5 PLT 275  Na 139 K 3.5 Cl 104 CO2 25 BUN 15 Cr 0.71 Glucose 139  Chol 129 TG 180 HDL 38 LDL 55  A1c 6.3  Assessment:     Krista ElizabethDebra J Blixt is a 58 y.o. female who I am visiting today for her PCP Nursing Home visit.    Plan:     # See problem list and after visit summary for problem-specific plans. - Of note, left leg numbness seems to have resolved as pt states she has good sensation.  # Health Maintenance: Not addressed. - Eval at f/u, including mammogram, colonoscopy, pap. Would like her to get flu shot as none is documented. Will discuss with Nursing Home team.  Follow-up: - Follow up in 2 months for next nursing home PCP visit. - Nursing Home team to continue following regularly.    Leona SingletonMaria T Shakiara Lukic, MD [facility]

## 2013-11-25 NOTE — Assessment & Plan Note (Signed)
Continues slowly improving with daily dressing changes and wound care visits. - Continue local wound care, frequent turning, daily and PRN silver alginate dressing changes, weekly MD wound care visits. - Encpurage OOB.

## 2013-11-25 NOTE — Assessment & Plan Note (Signed)
Still in place, no obvious signs of infection, patient amenable to removal since this is no longer being used for feeds/medication and it is not likely patient will need this any time soon. - It appears IR placed this in June 2014. Will need to contact them for removal.  - Will discuss with Nursing Home team.

## 2013-11-25 NOTE — Assessment & Plan Note (Signed)
Mild, pt afebrile with no acute abdominal findings; likely mild constipation with unknown last BM. - Miralax - Monitor pain

## 2013-11-25 NOTE — Assessment & Plan Note (Signed)
Well-controlled on diltiazem daily. Does not report symptoms.  - Continue diltiazem daily. - BP borderline; if continues to be borderline, consider decreasing medication. - Pt not on ACE-I but is a diabetic. Also need to consider starting this.

## 2013-11-25 NOTE — Assessment & Plan Note (Signed)
No obvious cause other than prolonged bedrest and poor motivation with schizophrenia. Per nursing, improving slightly with PT visits. However, LE strength for me 3/5 (and per Dr Randolm IdolFletke 10/12/13 was 4/5). Do not know how frequently PT is happening. - Continue PT, asked nursing to check that this happens 3-5 times weekly. Pt's schizophrenia makes her less motivated to do this on her own so she needs the extra help. - Continue getting up from bed daily.

## 2013-11-25 NOTE — Assessment & Plan Note (Signed)
Stable on switch from fluphenazine to latuda, mild increase in activity/motivation. Mood seems euthymic/mildly bright. - Continue latuda.  - F/u at next visit.

## 2013-11-25 NOTE — Assessment & Plan Note (Signed)
Patient taking levemir 8 BID. A1c 12/8 is 6.3. - Recheck A1c in late March or April. - Chol checked 12/8: 10-year ASCVD risk is 3.6%, but with diabetes she should be on mod-intensity statin. Pt is on pravastatin 40mg  daily which meets this recommendation. It appears this was started 7.15.14; previously she was on simvastatin starting 08/25/11. Lipitor was ordered 02/22/13 but discontinued 03/16/13. - Not on ACE-I or ARB currently. It looks like she was on lisinopril-HCTZ 8/12-4/14 and then it was stopped. Will look into this and if we can restart.

## 2013-11-25 NOTE — Assessment & Plan Note (Signed)
Increased ALP - Did not recheck today. No jaundice and abd pain is mild and vague. No organomegaly on exam. Last ALP 307 on 09/15/13. - Recheck 03/2014 or sooner if any abnormalities develop.

## 2013-12-01 ENCOUNTER — Encounter: Payer: Self-pay | Admitting: Family Medicine

## 2013-12-01 LAB — MICROALBUMIN, URINE: MICROALB UR: 0.7 (ref ?–1.8)

## 2013-12-11 LAB — CBC
A1c: 6.2
BUN: 19 mg/dL (ref 4–21)
CHLORIDE: 104 mmol/L
CO2: 24 mmol/L
CREATININE: 0.85
Calcium: 9.9 mg/dL
GLUCOSE: 119
HCT: 38 %
HEMOGLOBIN: 12.9 g/dL
MCV: 86.2 fL
Potassium: 4.5 mmol/L
Sodium: 138 mmol/L (ref 137–147)
WBC: 5.8
platelet count: 268

## 2013-12-11 LAB — CBC AND DIFFERENTIAL
HCT: 38 % (ref 36–46)
HEMOGLOBIN: 12.9 g/dL (ref 12.0–16.0)
Platelets: 268 10*3/uL (ref 150–399)
WBC: 5.8 10^3/mL

## 2013-12-11 LAB — BASIC METABOLIC PANEL
BUN: 19 mg/dL (ref 4–21)
CREATININE: 0.8 mg/dL (ref 0.5–1.1)
Glucose: 119 mg/dL
POTASSIUM: 4.5 mmol/L (ref 3.4–5.3)
SODIUM: 138 mmol/L (ref 137–147)

## 2013-12-11 LAB — HEMOGLOBIN A1C: HEMOGLOBIN A1C: 6.2 % — AB (ref 4.0–6.0)

## 2013-12-12 ENCOUNTER — Encounter: Payer: Self-pay | Admitting: Family Medicine

## 2013-12-13 ENCOUNTER — Encounter: Payer: Self-pay | Admitting: Family Medicine

## 2013-12-13 ENCOUNTER — Non-Acute Institutional Stay: Payer: Medicare Other | Admitting: Family Medicine

## 2013-12-13 ENCOUNTER — Other Ambulatory Visit: Payer: Self-pay | Admitting: Family Medicine

## 2013-12-13 DIAGNOSIS — Z931 Gastrostomy status: Secondary | ICD-10-CM

## 2013-12-13 DIAGNOSIS — F259 Schizoaffective disorder, unspecified: Secondary | ICD-10-CM

## 2013-12-13 DIAGNOSIS — R5383 Other fatigue: Secondary | ICD-10-CM

## 2013-12-13 DIAGNOSIS — E1139 Type 2 diabetes mellitus with other diabetic ophthalmic complication: Secondary | ICD-10-CM

## 2013-12-13 DIAGNOSIS — R531 Weakness: Secondary | ICD-10-CM

## 2013-12-13 DIAGNOSIS — M549 Dorsalgia, unspecified: Secondary | ICD-10-CM

## 2013-12-13 DIAGNOSIS — D638 Anemia in other chronic diseases classified elsewhere: Secondary | ICD-10-CM

## 2013-12-13 DIAGNOSIS — R5381 Other malaise: Secondary | ICD-10-CM

## 2013-12-13 MED ORDER — ACETAMINOPHEN ER 650 MG PO TBCR: 650.0000 mg | EXTENDED_RELEASE_TABLET | Freq: Three times a day (TID) | ORAL | Status: DC

## 2013-12-13 NOTE — Assessment & Plan Note (Signed)
A: improved on latuda. Not requiring xanax. P: Continue latuda D/c prn xanax after successful removal fo G tube

## 2013-12-13 NOTE — Assessment & Plan Note (Signed)
A: improving with improved nutrition and rehab P:  Continue current therapy

## 2013-12-13 NOTE — Assessment & Plan Note (Signed)
A: well controlled on current regimen. A1c wnl. P: Consider decreasing levemir vs transition to oral therapy.  No hypoglyemic events so no need to make changes acutely

## 2013-12-13 NOTE — Assessment & Plan Note (Signed)
A: improved patient w/o pain complaint and not requiring tramadol P: Keep low dose tramadol available for patient in case of pain with rehab.

## 2013-12-13 NOTE — Assessment & Plan Note (Signed)
Resolved.  Lab Results  Component Value Date   HGB 12.9 12/11/2013   HGB 12.9 12/11/2013

## 2013-12-13 NOTE — Assessment & Plan Note (Signed)
[facility].  If unsuccessful with schedule IR f/u.

## 2013-12-13 NOTE — Progress Notes (Signed)
  [facility] Attending Progress Note  Subjective:    Patient ID: Krista [name], female    DOB: [birth year], 58 y.o.   MRN: [id number]  HPI Patient seen for routine f/u:  No acute events. No concerns per patient.  1. DM2: complaint with levemir. Fasting CBG range 118-177 (287, outlier). Patient eating well. Denies CP, SOB, GI upset.   2. G tube- still in place. Placed by IR in 04/2013. Scant sanguineous discharge around insertion site. No pain. I spoke to IR today who recommends that it should be easily pulled and the motion is like starting a lawnmower. Advised using a towel to cover hands and hole after it is pulled out. Advised pulling on an empty stomach and covering stoma with guaze. I spoke to the patient who is amenable to a second attempt. We will try tomorrow AM, patient with no eat breakfast or be given AM levemir, patient will be pre-treated with 02.5 mg of xanax at 0900. If unsuccessful will call IR to set outpatient appt to have tube pulled.   3. Weakness: improving. Patient spends many hours out of bed in wheelchair. She pushes her wheelchair with her feet. She is participating in PT.   4. Schizoaffective disorder: stable mood. Tolerating latuda. Not requiring xanax.   Review of Systems As per HPI Patient denies cough, CP, SOB, N/V.D,     Objective:   Physical Exam BP 120/80  Pulse 68  Temp(Src) 97.5 F (36.4 C)  Resp 19  Wt 168 lb 6.4 oz (76.386 kg)  LMP 12/31/2009 Wt Readings from Last 3 Encounters:  12/05/13 168 lb 6.4 oz (76.386 kg)  06/15/13 160 lb 3.2 oz (72.666 kg)  03/16/13 208 lb 1.8 oz (94.4 kg)  General appearance: alert, cooperative and no distress, smiling and interactive during interview.  Lungs: clear to auscultation bilaterally Heart: regular rate and rhythm, S1, S2 normal, no murmur, click, rub or gallop Abdomen: obese, NABS, non tender, G tube with sanguineous drainage around insertion  Extremities: extremities normal, atraumatic, no cyanosis or  edema  Lab Results  Component Value Date   HGBA1C 6.2* 12/11/2013        Assessment & Plan:

## 2013-12-14 ENCOUNTER — Non-Acute Institutional Stay: Payer: Medicare Other | Admitting: Family Medicine

## 2013-12-14 ENCOUNTER — Encounter: Payer: Self-pay | Admitting: Family Medicine

## 2013-12-14 DIAGNOSIS — Z931 Gastrostomy status: Secondary | ICD-10-CM

## 2013-12-14 MED ORDER — POLYETHYLENE GLYCOL 3350 17 GM/SCOOP PO POWD: 17.0000 g | Freq: Every day | ORAL | Status: DC

## 2013-12-14 NOTE — Progress Notes (Signed)
Patient ID: Krista [name], female   DOB: 1956-07-21, 58 y.o.   MRN: [id number]  G tube removal note:  Procedure: Removal of percutaneous 59F pull-through gastrostomy tube. Via forceful pull. Tube avulsed from internal mushroom bolster. Scant bleeding < 5 cc. Pressure held. Pressure bandage applied.  Complications: internal mushroom bolster not removed with tubing  Recommendations:  Patient may eat.  Keep bandage in place until no longer seeping. Change as needed. Small meals (1/2 portions) for the next 48 hrs.

## 2013-12-14 NOTE — Assessment & Plan Note (Signed)
Tube removed 12/14/13. Internal mushroom bolster not removed with tube. Anticipate patient passing bolster. Asked patient to inform RN of abdominal pain and constipation. Patient started on miralax for reported baseline constipation.

## 2013-12-22 ENCOUNTER — Encounter: Payer: Self-pay | Admitting: Pharmacist

## 2014-01-19 ENCOUNTER — Ambulatory Visit: Payer: Self-pay | Admitting: Podiatrist

## 2014-01-29 ENCOUNTER — Telehealth: Payer: Self-pay | Admitting: Family Medicine

## 2014-01-29 NOTE — Telephone Encounter (Signed)
Called by staff at [facility] concerning Krista [name] foley cath. She has a catheter in place due to chronic wounds. She has been leaking around the size 14 all day. No fevers, foul smelling urine, or abdominal pain. Staff is concerned that since she is leaking, her wound is getting contaminated. Requesting verbal order to increase size of foley to 16.  Verbal order given. Will forward to attendings and geri resident for further review.  Zenovia Justman M. Kamaryn Grimley, M.D. 01/29/2014 8:47 PM

## 2014-01-29 NOTE — Telephone Encounter (Signed)
Erica from [facility] called back to confirm foley size. Currently patient has size 20, would like to go to 22. Verbal order given again to change foley.  Twana Wileman M. Marymargaret Kirker, M.D. 01/29/2014 9:22 PM

## 2014-02-13 LAB — BASIC METABOLIC PANEL
BUN: 22 mg/dL — AB (ref 4–21)
CREATININE: 0.9 mg/dL (ref 0.5–1.1)
GLUCOSE: 166 mg/dL
POTASSIUM: 3.6 mmol/L (ref 3.4–5.3)
SODIUM: 135 mmol/L — AB (ref 137–147)

## 2014-02-13 LAB — CBC AND DIFFERENTIAL
HEMATOCRIT: 36 % (ref 36–46)
Hemoglobin: 12.4 g/dL (ref 12.0–16.0)
Platelets: 283 10*3/uL (ref 150–399)
WBC: 8.8 10^3/mL

## 2014-02-13 LAB — LIPID PANEL
Cholesterol: 150 mg/dL (ref 0–200)
HDL: 40 mg/dL (ref 35–70)
Triglycerides: 490 mg/dL — AB (ref 40–160)

## 2014-02-13 LAB — HEMOGLOBIN A1C: HEMOGLOBIN A1C: 6.5 % — AB (ref 4.0–6.0)

## 2014-02-15 ENCOUNTER — Non-Acute Institutional Stay: Payer: Medicare Other | Admitting: Family Medicine

## 2014-02-15 DIAGNOSIS — E1149 Type 2 diabetes mellitus with other diabetic neurological complication: Secondary | ICD-10-CM | POA: Diagnosis not present

## 2014-02-15 DIAGNOSIS — M25519 Pain in unspecified shoulder: Secondary | ICD-10-CM

## 2014-02-15 DIAGNOSIS — M25512 Pain in left shoulder: Secondary | ICD-10-CM

## 2014-02-15 DIAGNOSIS — E1142 Type 2 diabetes mellitus with diabetic polyneuropathy: Secondary | ICD-10-CM

## 2014-02-15 DIAGNOSIS — E669 Obesity, unspecified: Secondary | ICD-10-CM

## 2014-02-15 DIAGNOSIS — E43 Unspecified severe protein-calorie malnutrition: Secondary | ICD-10-CM | POA: Diagnosis not present

## 2014-02-15 HISTORY — DX: Pain in left shoulder: M25.512

## 2014-02-15 NOTE — Assessment & Plan Note (Signed)
Resolved

## 2014-02-15 NOTE — Assessment & Plan Note (Signed)
A: no trauma, overuse or heavy lifting. Exam concerning for biceps tendinitis vs subacromial bursitis.  P: mobic 15 mg daily x 7 days. F/u in one week Consider subacromial injections for persistent symptoms.

## 2014-02-15 NOTE — Progress Notes (Signed)
  Attending Progress Note  Subjective:    Patient ID: Krista [name], female    DOB: 08-Dec-1955, 58 y.o.   MRN: [id number] CC: L lateral shoulder pain. Routine f/u  HPI 58 yo F seen for routine f/u visit:   Acute complaints: 1. L lateral shoulder pain: occurred once last week. Occuring again today. Started at rest a few hours after waking up. L anterior and lateral. Sharp pains. Non radiating. Worse with arm movement. No skin changes. There has been no injury, heavy lifting or overuse. She admits to intermittent sweats w/ and w/o pain. She denies associated chest pain, SOB, nausea, emesis.    2. Weight gain: noted weight gain. Patient eating very well. 2-3 BM per day. Not ambulating. Participating in PT. Goal weight is 150 #. Goal is to ambulate.   3. DM2: complaint with insulin, levemir BID. No low CBGs.   Review of Systems As per HPI     Objective:   Physical Exam BP 108/64  Pulse 65  Temp(Src) 98.6 F (37 C)  Resp 20  Wt 183 lb (83.008 kg)  LMP 12/31/2009 Wt Readings from Last 3 Encounters:  02/11/14 183 lb (83.008 kg)  12/05/13 168 lb 6.4 oz (76.386 kg)  06/15/13 160 lb 3.2 oz (72.666 kg)  General appearance: alert, cooperative, no distress and moderately obese, truncal obesity.  Lungs: clear to auscultation bilaterally Heart: regular rate and rhythm, S1, S2 normal, no murmur, click, rub or gallop Abdomen: obese, healed peg tube insertion site, NABS, no masses. mild TTP b/l LQ.  Extremities: no edema.  Skin: small 5 mm wide x 2 cm length sacral decub  Last A1c 6.4 01/2014     Assessment & Plan:

## 2014-02-15 NOTE — Assessment & Plan Note (Addendum)
A: A1c and weight on the rise. Patient's goal weight is 150#. Patient is participating in PT. P: Patient made aware of weight. Will work on avoiding overeating.  I did not discuss this with patient but she may benefit from addition of metformin 500 mg XR after evening meal, titrating up to 1000 mg XR.

## 2014-02-15 NOTE — Assessment & Plan Note (Signed)
A: patient gaining weight rapidly. No sign of fluid overload. She has been at high as 220 # prior to prolonged hospitalization.  P:  Discussed portion control with patient.  Geri resident will discuss starting metformin with question. A nutrition consult for recommendations would also be very reasonable.

## 2014-02-16 ENCOUNTER — Encounter: Payer: Self-pay | Admitting: Family Medicine

## 2014-02-17 ENCOUNTER — Other Ambulatory Visit: Payer: Self-pay | Admitting: Family Medicine

## 2014-02-17 MED ORDER — METFORMIN HCL ER (MOD) 500 MG PO TB24
500.0000 mg | ORAL_TABLET | Freq: Every day | ORAL | Status: DC
Start: 2014-02-17 — End: 2014-03-24

## 2014-02-17 NOTE — Progress Notes (Signed)
Metformin 500 mg added. Nutrition consult ordered for help with weight loss.

## 2014-02-21 ENCOUNTER — Non-Acute Institutional Stay (INDEPENDENT_AMBULATORY_CARE_PROVIDER_SITE_OTHER): Payer: Medicare Other | Admitting: Family Medicine

## 2014-02-21 DIAGNOSIS — M25512 Pain in left shoulder: Secondary | ICD-10-CM

## 2014-02-21 DIAGNOSIS — E669 Obesity, unspecified: Secondary | ICD-10-CM

## 2014-02-21 DIAGNOSIS — K449 Diaphragmatic hernia without obstruction or gangrene: Secondary | ICD-10-CM

## 2014-02-21 DIAGNOSIS — E1139 Type 2 diabetes mellitus with other diabetic ophthalmic complication: Secondary | ICD-10-CM

## 2014-02-21 DIAGNOSIS — M25519 Pain in unspecified shoulder: Secondary | ICD-10-CM

## 2014-02-21 NOTE — Patient Instructions (Signed)
[place]

## 2014-02-21 NOTE — Assessment & Plan Note (Signed)
No obvious cause, but per 4/15 exam, possibly subacromial bursitis or biceps tendonitis. Finished 1 wk course of mobic. No pain today, though some tenderness anterior and lateral shoulder on exam. Normal ROM. No erythema or joint deformity. - Monitor in 1 week.  - No subacromial injection for now. If persistent pain in 1 week, reconsider.

## 2014-02-21 NOTE — Assessment & Plan Note (Signed)
A1c worsened to 6.5 on 4/13 (from 6.2 on 2/8).  - Recheck A1c mid-July. - Started metformin XL 500mg  daily 4/17 with plans to increase to 1000mg  daily if tolerating. - Continue levemir 8 BID.

## 2014-02-21 NOTE — Assessment & Plan Note (Signed)
Stomach fullness Possibly due to hiatal hernia. Per nursing, seems patient is having regular stools and has miralax ordered daily. Afebrile with stable vitals. Right-sided tenderness not seen on prior exam. H/o cholecystectomy per path report 04/2003 (for chronic cholecystitis and cholesterolosis). - Will discuss if CMET would be helpful with NH team. - For now, monitor for worsening abdominal pain, signs of infection, or signs of constipation.

## 2014-02-21 NOTE — Assessment & Plan Note (Signed)
Thought by NH team to be due to inactivity primarily, and I agree with this. Has been working with PT in the past and we had attempted at last visit to get them to increase # days per week they see pt. - Reconsulted PT to walk patient 5d/week. - Goal weight 150 lbs. - Work on overeating. Nutrition consulted and will await recs. - Discussed starting metformin XL (24 hr) 500 mg  after evening meal, plan to titrate up to 1000 mg XR - Triglycerides: 490. Will treat with diet/exercise for now and recheck in 3 - 6 mo.

## 2014-02-21 NOTE — Progress Notes (Addendum)
Patient ID: Krista [name], female   DOB: 1956/11/01, 58 y.o.   MRN: [id number] Subjective:   CC: Nursing home visit. Pt complains of occasional left shoulder pain and stomach "fullness."   HPI:   Left shoulder pain - Initially discussed 4/15. No trauma or obvious inciting cause. Thought possible biceps tendonitis vs subacromial bursitis and started mobic 15mg  daily x 7 days. Initial questioning, patient does not report any pain. On further inquiry, she states left shoulder hurts "on and off:, most recently "the other day." Not hurting currently. Denies any problems with range of motion.  Stomach fullness - Patient reports no pain but a "full" sensation. She is not sure if she had BM today but nurse stated she had large BM over weekend, and today room smells of stool and aide just cleaned patient.   Diabetes mellitus type II - Patient was taking levemir 8 BID with plan to possibly transition to oral with normal A1c 2/8 opf 6.2, then 6.5 on 02/13/14. Denies chest pain, shortness of breath, feeling anxious or having any other discomfort. Lipids checked, TG 490 4/13. Metformin XL 500mg  daily, ordered 4/17.  Weight gain - Per notes, patient has been gaining weight, thought due to inactivity. Reports normal PO and getting up to wheelchair with PT daily.  Sacral decubitus ulcer - Pt does not report any complaints about this. Foley catheter in place due to ulcer. Increased size to 16 and then again to 22 01/29/14 due to leaking around size 14.  G-tube - [facility]08/[facility] by [facility] for severe protein-calorie malnutrition but had not been used since coming to [facility], so pulled on 12/14/13. Internal mushroom bolster not removed with tube, and anticipated pt passing bolster.  Review of Systems - Per HPI. Additionally, patient denies chest pain, difficulty breathing, or other pain besides low back pain that is intermittent and moderate.    PMH: Reviewed. SH: Does not walk. Up to wheelchair daily per  pt.    Objective:  Physical Exam LMP 12/31/2009 VS: Per nursing chart: 4/11 (most recent documented VS): 99.69F, 84BPM, 24 resp/min, 119/69, CBG 168 on 4/20 GEN: NAD, lying in bed HEENT: Atraumatic, normocephalic, neck supple, EOMI, sclera clear, missing teeth/very poor dentition  CV: RRR, 2+ DP pulse bilaterally PULM: CTAB, normal effort ABD: Soft, right-sided tenderness, mildly distended, NABS  SKIN: No rash or cyanosis; warm and well-perfused EXTR: No lower extremity edema or calf tenderness; normal bilateral LE sensation to gross touch, 3/5 bilateral LE strength (hip flexion, knee extension), 4/5 plantar flexion. Symmetrically atrophic calves. Left anterior and lateral shoulder tender, though no deformity or erythema. Full ROM.  PSYCH: Mood and affect euthymic, normal rate and volume of speech; does not seem to be responding to internal stimuli. Denies visual or auditory hallucinations. Denies SI/HI. NEURO: Awake, alert, no focal deficits grossly, normal speech, PERRL, EOMI, alert and oriented x person and partially place (outpatient office) and time (February 24 2014). Unable to sit up in bed without 1-2 person assistance. Able to turn self over in bed.  Most recent labs:   4/11: CBC normal except HCT 35.7 BMET normal except glucose 166 TG 490 up from <200 in January 2015  Assessment:     Krista ElizabethDebra J Grim is a 58 y.o. female who I am visiting today for her PCP Nursing Home visit.    Plan:     # See problem list and after visit summary for problem-specific plans.  Left shoulder pain - No obvious cause, but per  4/15 exam, possibly subacromial bursitis or biceps tendonitis. Finished course of mobic. No pain today, though some tenderness anterior and lateral shoulder on exam. Normal ROM. No erythema or joint deformity. - Monitor in 1 week.  - No subacromial injection for now. If persistent pain in 1 week, reconsider.  Stomach fullness - Possibly due to hiatal hernia. Per nursing,  seems patient is having regular stools and has miralax ordered daily. Afebrile with stable vitals. Right-sided tenderness not seen on prior exam. H/o cholecystectomy per path report 04/2003 (for chronic cholecystitis and cholesterolosis). - Will discuss if CMET would be helpful with NH team. - For now, monitor for worsening abdominal pain, signs of infection, or signs of constipation.  Diabetes mellitus type II - A1c worsened to 6.5 on 4/13 (from 6.2 on 2/8).  - Recheck A1c mid-July. - Started metformin XL 500mg  daily 4/17 with plans to increase to 1000mg  daily if tolerating. - Continue levemir 8 BID.  Weight gain - Thought by NH team to be due to inactivity primarily, and I agree with this. Has been working with PT in the past and we had attempted at last visit to get them to increase # days per week they see pt. - Reconsulted PT to walk patient 5d/week. - Goal weight 150 lbs. - Work on overeating. Nutrition consulted and will await recs. - Discussed starting metformin XL (24 hr) 500 mg  after evening meal, plan to titrate up to 1000 mg XR - Triglycerides: 490. Will treat with diet/exercise for now and recheck in 3 - 6 mo.   G-tube - Removed 12/14/13.  - At this time, pt has likely passed mushroom bolster that was not removed.  # Health Maintenance: Pt amenable to health maintenance. Last pap 2011 (neg). No documented colonoscopy or mammogram. - Will discuss with NH team how to coordinate getting these done.   Follow-up: - Follow up in 1 month for next nursing home PCP visit. - Nursing Home team to continue following regularly.  Addendum:  Discussed with Dr Durene CalHunter 4/23. Abdominal pain - resolved. Will not check labs at this time. Pap- [facility] to [facility]. Will discuss with patient at next visit. Mammogram- Dr Durene CalHunter ordered screening mammogram.  Colonoscopy- Dr Durene CalHunter ordered GI consult for screening colonoscopy.  Will need to ask [facility] to arrange rides for both.  Leona SingletonMaria T  Boone Gear, MD [facility]

## 2014-03-02 ENCOUNTER — Other Ambulatory Visit: Payer: Self-pay | Admitting: Family Medicine

## 2014-03-13 LAB — BASIC METABOLIC PANEL
BUN: 20 mg/dL (ref 4–21)
CREATININE: 0.8 mg/dL (ref 0.5–1.1)
Glucose: 121 mg/dL
POTASSIUM: 4.4 mmol/L (ref 3.4–5.3)
SODIUM: 138 mmol/L (ref 137–147)

## 2014-03-13 LAB — LIPID PANEL
CHOLESTEROL: 169 mg/dL (ref 0–200)
HDL: 47 mg/dL (ref 35–70)
LDL Cholesterol: 69 mg/dL
Triglycerides: 264 mg/dL — AB (ref 40–160)

## 2014-03-13 LAB — HEPATIC FUNCTION PANEL
ALT: 17 U/L (ref 7–35)
AST: 17 U/L (ref 13–35)
Alkaline Phosphatase: 113 U/L (ref 25–125)
Bilirubin, Total: 0.3 mg/dL

## 2014-03-13 LAB — HEMOGLOBIN A1C: HEMOGLOBIN A1C: 6.7 % — AB (ref 4.0–6.0)

## 2014-03-14 ENCOUNTER — Encounter: Payer: Self-pay | Admitting: Family Medicine

## 2014-04-05 ENCOUNTER — Non-Acute Institutional Stay: Payer: Medicare Other | Admitting: Family Medicine

## 2014-04-05 DIAGNOSIS — I82409 Acute embolism and thrombosis of unspecified deep veins of unspecified lower extremity: Secondary | ICD-10-CM

## 2014-04-05 DIAGNOSIS — L89109 Pressure ulcer of unspecified part of back, unspecified stage: Secondary | ICD-10-CM

## 2014-04-05 DIAGNOSIS — E785 Hyperlipidemia, unspecified: Secondary | ICD-10-CM

## 2014-04-05 DIAGNOSIS — R5383 Other fatigue: Secondary | ICD-10-CM

## 2014-04-05 DIAGNOSIS — R5381 Other malaise: Secondary | ICD-10-CM

## 2014-04-05 DIAGNOSIS — L89153 Pressure ulcer of sacral region, stage 3: Secondary | ICD-10-CM

## 2014-04-05 DIAGNOSIS — L8993 Pressure ulcer of unspecified site, stage 3: Secondary | ICD-10-CM

## 2014-04-05 DIAGNOSIS — I10 Essential (primary) hypertension: Secondary | ICD-10-CM

## 2014-04-05 DIAGNOSIS — E1139 Type 2 diabetes mellitus with other diabetic ophthalmic complication: Secondary | ICD-10-CM

## 2014-04-05 DIAGNOSIS — E781 Pure hyperglyceridemia: Secondary | ICD-10-CM

## 2014-04-05 DIAGNOSIS — R531 Weakness: Secondary | ICD-10-CM

## 2014-04-07 NOTE — Assessment & Plan Note (Signed)
Triglycerides stable.

## 2014-04-07 NOTE — Assessment & Plan Note (Signed)
Cholesterol stable. ASCVD 3.9% however patient has had previous CVA. Will forward note to PCP to consider titration to Lipitor/Crestor.

## 2014-04-07 NOTE — Progress Notes (Signed)
   Subjective:    Patient ID: Krista [name], female    DOB: July 11, 1956, 58 y.o.   MRN: [id number]  HPI 58 y/o female with PMH IDT2DM, HTN, HLD, Schizoaffective Disorder, Decubitus sacral ulcer, CVA, DVT seen and evaluated at [facility].  No acute issues identified by the patient or nursing staff.   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  Respiratory: Negative for cough and shortness of breath.   Cardiovascular: Negative for chest pain and leg swelling.  Gastrointestinal: Negative for nausea, vomiting and diarrhea.  Skin: Positive for wound.  Neurological: Positive for weakness. Negative for syncope.       Objective:   Physical Exam Vitals: reviewed Gen: pleasant Caucasian female, NAD, laying in bed HEENT: normocephalic, pupils equal in size bilaterally, MMM, neck supple, no anterior or posterior cervical lymphadenopathy Cardiac: RRR, S1 and S2 present, no murmurs, no heaves/thrills Resp: CTAB, normal effort Abd: soft, no tenderness, normal bowel sounds Skin: chaperone present, sacral ulcer primarily stage one with two small deeper (likey stage 3 ) area roughly 0.5 cm in diameter, significant improvement from previous examinations Neuro: A&O X3, moving all extremities, upper extremity strength 5/5, bilateral LE strength 4/5, patient able to stand last week with 2 person assist  Lab work (03/13/14): BMP normal except glucose 121, LFT's wnl except albumin 3.4, Hemoglobin A1C 6.7, Total cholesterol 169, Trig 264, HLD 47, LDL 69       Assessment & Plan:  Please see problem specific assessment and plan.

## 2014-04-07 NOTE — Assessment & Plan Note (Signed)
Mild worsening of A1C. Suspect due to improved PO intake. -No changes to pharmacotherapy today however could consider increasing Metformin to 1000 BID

## 2014-04-07 NOTE — Assessment & Plan Note (Signed)
Patient still wheelchair bound however strength is improving.  -continue PT -Encourage patient to ambulate with assist

## 2014-04-07 NOTE — Assessment & Plan Note (Signed)
Has completed 12 months of anticoagulation. No bleeding episodes. -continue anticoagulation with Xarelto (PCP to consider discontinuation).

## 2014-04-07 NOTE — Assessment & Plan Note (Signed)
Significant improvement over the past few months. Mostly stage one with two 0.5 cm diameter likely stage 3 (no probed) areas. -continue local wound care

## 2014-04-07 NOTE — Assessment & Plan Note (Signed)
Well-controlled on current regimen. ?

## 2014-05-09 LAB — HEPATIC FUNCTION PANEL
ALT: 91 U/L — AB (ref 7–35)
AST: 39 U/L — AB (ref 13–35)
Alkaline Phosphatase: 395 U/L — AB (ref 25–125)
BILIRUBIN DIRECT: 0.6 mg/dL — AB (ref 0.01–0.4)
Bilirubin, Total: 1.3 mg/dL

## 2014-05-09 LAB — CBC AND DIFFERENTIAL
HCT: 37 % (ref 36–46)
Hemoglobin: 12.3 g/dL (ref 12.0–16.0)
Platelets: 307 10*3/uL (ref 150–399)
WBC: 6.3 10^3/mL

## 2014-05-09 LAB — LIPID PANEL
Cholesterol: 139 mg/dL (ref 0–200)
HDL: 42 mg/dL (ref 35–70)
LDL Cholesterol: 69 mg/dL
Triglycerides: 139 mg/dL (ref 40–160)

## 2014-05-09 LAB — BASIC METABOLIC PANEL
BUN: 18 mg/dL (ref 4–21)
CREATININE: 0.9 mg/dL (ref 0.5–1.1)
Glucose: 213 mg/dL
Potassium: 3.9 mmol/L (ref 3.4–5.3)
SODIUM: 137 mmol/L (ref 137–147)

## 2014-05-09 LAB — HEMOGLOBIN A1C: Hgb A1c MFr Bld: 6.7 % — AB (ref 4.0–6.0)

## 2014-05-11 ENCOUNTER — Non-Acute Institutional Stay (INDEPENDENT_AMBULATORY_CARE_PROVIDER_SITE_OTHER): Payer: Medicare Other | Admitting: Family Medicine

## 2014-05-11 ENCOUNTER — Encounter: Payer: Self-pay | Admitting: Family Medicine

## 2014-05-11 DIAGNOSIS — L89153 Pressure ulcer of sacral region, stage 3: Secondary | ICD-10-CM

## 2014-05-11 DIAGNOSIS — R5383 Other fatigue: Secondary | ICD-10-CM

## 2014-05-11 DIAGNOSIS — E1139 Type 2 diabetes mellitus with other diabetic ophthalmic complication: Secondary | ICD-10-CM

## 2014-05-11 DIAGNOSIS — E669 Obesity, unspecified: Secondary | ICD-10-CM

## 2014-05-11 DIAGNOSIS — L89109 Pressure ulcer of unspecified part of back, unspecified stage: Secondary | ICD-10-CM

## 2014-05-11 DIAGNOSIS — R531 Weakness: Secondary | ICD-10-CM

## 2014-05-11 DIAGNOSIS — L8993 Pressure ulcer of unspecified site, stage 3: Secondary | ICD-10-CM

## 2014-05-11 DIAGNOSIS — R5381 Other malaise: Secondary | ICD-10-CM

## 2014-05-11 DIAGNOSIS — I10 Essential (primary) hypertension: Secondary | ICD-10-CM

## 2014-05-11 NOTE — Assessment & Plan Note (Signed)
Per exam, this is likely still a stage I with erythema at the sacrum. There is still a small epithelialized, tunneled area. -Discussed with nursing and ordered up to chair 2 times a day for at least 2 hours each time, and change position with every Depends change.

## 2014-05-11 NOTE — Assessment & Plan Note (Signed)
Has completed greater than 12 months of anticoagulation with no reports of bleeding. -Will continue Anticoagulation with Xarelto especially given her level of inactivity, but consider discontinuation if patient begins falling.

## 2014-05-11 NOTE — Assessment & Plan Note (Signed)
Psychiatry was consulted November 2014.Patient has significant negative symptoms with her lack of motivation. This is complicated by her mother being here as well, and nursing has noticed that she has become even less motivated for physical activity since then. -Consider reconsulting psychiatry for further recommendations in addition to her latuda.

## 2014-05-11 NOTE — Assessment & Plan Note (Signed)
Well-controlled on current regimen. ?

## 2014-05-11 NOTE — Progress Notes (Signed)
Patient ID: Krista [name], female   DOB: 1956/08/30, 58 y.o.   MRN: [id number] Subjective:   CC: Nursing home PCP visit  HPI:   Krista [name] is a 58 year old female with a history of type 2 diabetes, hypertriglyceridemia, hyperlipidemia, obesity, hypertension, CVA, prior sacral decubitus ulcer that is now healed, and schizoaffective disorder. She is being seen in a nursing home for PCP visit. Patient has no complaints today.   Decreasing LE strength and increased weight Nursing reports that she has not been participating in physical therapy, she has gained a lot of weight because of eating "all the time" and doing less and less activity since her mother also admitted to the nursing home. She is up out of bed only 2 hours per day.   Sacral decubitus ulcer Nursing reports that her sacral decubitus ulcer is now gone.   Prior G-tube Her G-tube has also been removed since my last visit.  Review of Systems - Per HPI.   Smoking status: Former smoker Medications: Reviewed in Epic, reconciled against medications at [facility]     Objective:  Physical Exam LMP 12/31/2009 VS: 7/6: T 97.1 F, 86, 20, 132/68, 98% on RA GEN: NAD, laying in bed Cardiovascular: Regular rate and rhythm, no murmurs rubs or gallops, 2+ bilateral dorsalis pedis pulses Pulmonary: Clear to auscultation bilaterally, normal effort Abdomen: Soft, generalized mild tenderness with no rebound or guarding, nondistended, obese Extremities: Bilateral lower extremities atrophied, with no lower extremity edema or tenderness, bilateral lower extremities with 3/5 hip flexion and 4/5 ankle plantar flexion and dorsiflexion Psych: Mood and affect euthymic, normal rate and volume of speech Neuro: Awake, alert, no focal deficits, normal speech Skin: Sacral decubitus ulcer has healed but there is still a small tunneled area with epithelialization, however there is a 3-4 cm area where the skin is visibly erythematous but not indurated or  broken down     Assessment:     Krista [name] is a 58 y.o. female with complex past medical history here for  nursing home PCP visit.   Plan:     Decreased LE strength  Patient is wheelchair-bound chronically. Per nursing, patient has refused to participate in PT. Per patient, she does participate and is willing to continue. her lower externally strength seems to be decreasing. -Discussed the patient that participation in physical therapy very important. -Reordered physical therapy.  Increased weight Increased visibly since last visit. Patient is reportedly eating more and less active. -Will need to verify weight. Goal weight is 150 pounds.  -Discussed with patient to decrease intake and increase activity. -Nutrition was previously consulted for overeating. Will followup with them as well.  Diabetes mellitus, type II with history of retinopathy She is taking metformin extended release 1000 mg daily. A1c 03/2014 was 6.7, up from 6.5. I suspect that with her increase in intake, her A1c will worsen. -Discussed controlling intake and increasing activity with patient. -Check A1c next in Aug/2015. -Given prior CVA, Will increase statin to Lipitor. -Continue Levemir 8 units twice a day -Will need to clarify patient is getting sliding scale insulin if this is listed in Epic but not at [facility].  Sacral decubitus ulcer Per exam, this is likely still a stage I with erythema at the sacrum. There is still a small epithelialized, tunneled area. -Discussed with nursing and ordered up to chair 2 times a day for at least 2 hours each time, and change position with every Depends change.  Schizoaffective disorder with negative symptoms Psychiatry was  consulted November 2014.Patient has significant negative symptoms with her lack of motivation. This is complicated by her mother being here as well, and nursing has noticed that she has become even less motivated for physical activity since  then. -Consider reconsulting psychiatry for further recommendations in addition to her latuda.  Hypertension Well controlled on current regimen.  History of DVT Has completed greater than 12 months of anticoagulation with no reports of bleeding. -Will continue Anticoagulation with Xarelto especially given her level of inactivity, but consider discontinuation if patient begins falling.  # Health Maintenance:  Patient received a flu shot October 2014. screening mammogram was done. GI was consulted around May 2015 for screening colonoscopy.  -Repeat flu shot October. -need to discuss Pap smear with patient.  Follow-up: Follow up in  2 months for  next nursing home PCP visit.   Krista SingletonMaria T Damita Eppard, MD [facility]

## 2014-05-11 NOTE — Assessment & Plan Note (Signed)
Patient is wheelchair-bound chronically. Per nursing, patient has refused to participate in PT. Per patient, she does participate and is willing to continue. her lower externally strength seems to be decreasing. -Discussed the patient that participation in physical therapy very important. -Reordered physical therapy.

## 2014-05-11 NOTE — Assessment & Plan Note (Signed)
She is taking metformin extended release 1000 mg daily. A1c 03/2014 was 6.7, up from 6.5. I suspect that with her increase in intake, her A1c will worsen. -Discussed controlling intake and increasing activity with patient. -Check A1c next in Aug/2015. -Given prior CVA, Will increase statin to Lipitor. -Continue Levemir 8 units twice a day -Will need to clarify patient is getting sliding scale insulin if this is listed in Epic but not at [facility].

## 2014-05-11 NOTE — Assessment & Plan Note (Signed)
Increased visibly since last visit. Patient is reportedly eating more and less active. -Will need to verify weight. Goal weight is 150 pounds.  -Discussed with patient to decrease intake and increase activity. -Nutrition was previously consulted for overeating. Will followup with them as well.

## 2014-05-12 ENCOUNTER — Encounter: Payer: Self-pay | Admitting: Family Medicine

## 2014-05-12 ENCOUNTER — Telehealth: Payer: Self-pay | Admitting: Family Medicine

## 2014-05-12 ENCOUNTER — Non-Acute Institutional Stay: Payer: Medicare Other | Admitting: Family Medicine

## 2014-05-12 DIAGNOSIS — E785 Hyperlipidemia, unspecified: Secondary | ICD-10-CM

## 2014-05-12 DIAGNOSIS — I10 Essential (primary) hypertension: Secondary | ICD-10-CM

## 2014-05-12 DIAGNOSIS — I639 Cerebral infarction, unspecified: Secondary | ICD-10-CM

## 2014-05-12 DIAGNOSIS — I82409 Acute embolism and thrombosis of unspecified deep veins of unspecified lower extremity: Secondary | ICD-10-CM

## 2014-05-12 DIAGNOSIS — I6350 Cerebral infarction due to unspecified occlusion or stenosis of unspecified cerebral artery: Secondary | ICD-10-CM

## 2014-05-12 DIAGNOSIS — E1139 Type 2 diabetes mellitus with other diabetic ophthalmic complication: Secondary | ICD-10-CM

## 2014-05-12 NOTE — Telephone Encounter (Signed)
[facility]  Called by nursing home regarding patient. She [facility] with [facility] and was just wheeled back. She admits to smoking 1 cigarette when she was out. She had 1 episode of emesis and had labored breathing per the nurse tech while she was laying flat. She is not labored any longer now that she is sitting up. She is sleeping comfortably. She does not report any concerns when she was awake. She has some tremors and vomited a second time. It appears to be what she ate. No blood. Rectal exam she is not impacted. She is speaking and behaving like her usual self.  VS : 99.47F, 101 BPM, 146/88, 98% BG 192  Patient sounds stable and may have had a bad reaction from smoking cigarettes after a long time of not smoking while living in the Nursing Home. Afebrile. Behaving normally. No impaction on nurse's rectal exam. Asked nursing to perform the following: - UDS - Soup tnite and some increased fluids. - Recheck vital signs in 1-2 hours. - Call if she has any further emesis, new symptoms, or vital sign changes.  Leona SingletonMaria T Roxana Lai, MD

## 2014-05-12 NOTE — Progress Notes (Signed)
Patient ID: Krista [name], female   DOB: August 31, 1956, 58 y.o.   MRN: [id number] Patient ID: Krista [name], female   DOB: August 31, 1956, 58 y.o.   MRN: [id number] Subjective:  I have seen and examined this patient. I have discussed with Dr Judie PetitM. Thekkekandam.  I agree with their findings and plans as documented in their routine visit note from July 9th, 2015. My addition and amendments are in eBayed typeface   CC: Nursing home PCP visit  HPI:   Krista. Krista [name] is a 58 year old female with a history of type 2 diabetes, hypertriglyceridemia, hyperlipidemia, obesity, hypertension, CVA, prior sacral decubitus ulcer that is now healed, and schizoaffective disorder. She is being seen in a nursing home for PCP visit. Patient has no complaints today.   Decreasing LE strength and increased weight Nursing reports that she has not been participating in physical therapy, she has gained a lot of weight because of eating "all the time" and doing less and less activity since her mother also admitted to the nursing home. She is up out of bed only 2 hours per day.   Sacral decubitus ulcer Nursing reports that her sacral decubitus ulcer is now gone.   Prior G-tube Her G-tube has also been removed since my last visit.  Review of Systems - Per HPI.   Smoking status: Former smoker Medications: Reviewed in Epic, reconciled against medications at [facility]     Objective:  Physical Exam BP 132/68  Pulse 86  Temp(Src) 97.1 F (36.2 C)  Resp 20  SpO2 98%  LMP 12/31/2009 on 05/08/14 VS: 7/6: T 97.1 F, 86, 20, 132/68, 98% on RA GEN: NAD, laying in bed Cardiovascular: Regular rate and rhythm, no murmurs rubs or gallops, 2+ bilateral dorsalis pedis pulses Pulmonary: Clear to auscultation bilaterally, normal effort Abdomen: Soft, (+) BS Extremities: Moving limbs spontaneously Psych: Mood and affect euthymic, normal rate and volume of speech Neuro: Awake, alert, no focal deficits, normal speech      Assessment:      Krista [name] is a 58 y.o. female with complex past medical history here for  nursing home PCP visit.   Plan:     Decreased LE strength  Patient is wheelchair-bound chronically. Per nursing, patient has refused to participate in PT. Per patient, she does participate and is willing to continue. her lower externally strength seems to be decreasing. -Discussed the patient that participation in physical therapy very important. -I agree with attempt at PT.  Reordered physical therapy.  Increased weight Increased visibly since last visit. Patient is reportedly eating more and less active. -Will need to verify weight. Goal weight is 150 pounds.  -Discussed with patient to decrease intake and increase activity. -Nutrition  consulted diet recommendations. - Consult with Psychiatry (contracted infacility) to assess weight gain with Atypical Antipsychotic.   Diabetes mellitus, type II with history of retinopathy She is taking metformin extended release 1000 mg daily. A1c 03/2014 was 6.7, up from 6.5. -Check A1c next in Aug/2015. -Given prior CVA, Will increase statin to Lipitor high-dose >= 40 mg daily - Will need to clarify last ophthamologic eye exam for follow up of retinopathy.  -Continue Levemir 8 units twice a day -Will need to clarify patient is getting sliding scale insulin if this is listed in Epic but not at [facility].  Sacral decubitus ulcer -Repositioning therapy and moisture control.  Schizoaffective disorder with negative symptoms Psychiatry was consulted November 2014.Patient has significant negative symptoms with her lack of motivation. This is complicated  by her mother being here as well, and nursing has noticed that she has become even less motivated for physical activity since then. -Krista [name] (MSW) asked to re-consulting  Psychiatry (contracted infacility) for further recommendations in addition to her atypical Antipsychotic therapy and weight gain .  Hypertension Well  controlled on current regimen. Tolerating current regiment. - Continue current Diltiazem 360 mg   History of DVT Has completed greater than 12 months of anticoagulation with no reports of bleeding. -Will continue Anticoagulation with Xarelto especially given her level of inactivity, but consider discontinuation if patient begins falling. - Patient has IVC filter in place which places her at increase risk of DVT. Agree with continuation of novel oral anticoagulant therapy long-term.   # Health Maintenance:  Patient received a flu shot October 2014. screening mammogram was done. GI was consulted around May 2015 for screening colonoscopy.  -Repeat flu shot October. -need to discuss Pap smear with patient.  Follow-up: Follow up in  2 months for  next nursing home PCP visit.   Krista Roach Mubashir Mallek, MD [facility]

## 2014-05-13 NOTE — Telephone Encounter (Signed)
Addendum: Called again by nursing because repeat vital signs are still normal other than pulse of 120. On repeat over the phone, pulse is 117.  Visited pt in nursing home. She denies symptoms. She is not having any pain including chest pain, dyspnea, or other complaint. She visited her son in the hospital today, and he is apparently going to be taken off life support tomorrow. She also has not had much to drink today per her and nursing thinks this is likely.  O: VS per above GEN: NAD CV: RRR, no m/r/g PULM: Diffiucult due to patient laying and unable to sit unassisted; anteriorly CTAB, posteriorly CTAB upper lung fields and lateral lung fields; normal effort; dry cough ABD: Soft, mild generalized tenderness; no rebound or guarding EXTR: No LE edema or calf tenderness Neuro: Asleep, wakes appropriately, answers questions appropriately, no focal deficits.  A/P: 58 y.o. female with complex PMH including schizoaffective d/o with tachycardia and recent episodes of vomiting x 2. Possible reaction from smoking. Also could be dehydration vs stress from son's illness. Behaving normally, afebrile, clear lungs though limtied exam, pt is comfortable. - Push PO fluids tonight. - Check UDS and EKG - ordered and spoke to nursing. - Recheck VS in 1-2 more hours. - Check BG. - If persistent, would check TSH, UA, CXR, BMP, CBC mid-week. - Call back resident reasons reviewed: fevers, change in respiratory or mental status, or other concern.  Krista SingletonMaria T Youlanda Tomassetti, MD PGY-3, [facility]

## 2014-05-15 ENCOUNTER — Encounter: Payer: Self-pay | Admitting: Family Medicine

## 2014-05-15 ENCOUNTER — Telehealth: Payer: Self-pay | Admitting: *Deleted

## 2014-05-15 NOTE — Telephone Encounter (Signed)
05/15/2014  EKG reviewed: NSR, nonspecific T wave abnormality (flattening).  Similar to EKG 03-10-13, with flattened T in lead I unchanged.  Leona SingletonMaria T Casyn Becvar, MD

## 2014-05-15 NOTE — Telephone Encounter (Signed)
Evaluated patient. Her skin appears normal but mildly erythematous from scratching her left arm. She is scratching while I am in the room. She states this happens occasionally when she gets anxious, and a close family member is currently very ill. She does not report fevers or chills. On check, her pulse is normal (she was tachycardic 3 nights ago but this has resolved). She is breathing normally and speaking normally. She has no other complaints today. She states benadryl has worked well in the past. - Rxed benadryl 12.5mg  PO x 1. - Advised pt to try not to itch. - On review of labs, it looks like ALP, AST, and ALT have recently been elevated. Will need to further consider if this could be cholestasis-related (ALP more elevated thatn AST and ALT) vs a hepatocellular condition. - Consider INR, abd US, hepatitis panel  Leona SingletonMaria T Clancy Mullarkey, MD PGY-3, [facility]

## 2014-05-15 NOTE — Telephone Encounter (Signed)
Pt called and is requesting to take Benadryl for itching of her face, arms and neck.  She stated that the nurses at the rehab wanted an order from her doctor before they gave her a Benadryl.  She has tried lotion, but nothing is helping.  Please advise.  Clovis PuMartin, Raeden Belzer L, RN

## 2014-05-19 LAB — HEPATITIS PANEL, ACUTE
HEP A IGM: NEGATIVE
HEP B C IGM: NEGATIVE
Hepatitis B Surface Antigen: NEGATIVE
Hepatitis C Ab: NEGATIVE

## 2014-05-19 LAB — PROTIME-INR
INR: 1.1 (ref 0.9–1.1)
PROTHROMBIN TIME: 13.9 (ref 11.6–15.2)

## 2014-05-30 ENCOUNTER — Encounter: Payer: Self-pay | Admitting: Family Medicine

## 2014-05-31 ENCOUNTER — Encounter: Payer: Self-pay | Admitting: Family Medicine

## 2014-06-04 LAB — HEPATIC FUNCTION PANEL
ALT: 9 U/L (ref 7–35)
AST: 13 U/L (ref 13–35)
Alkaline Phosphatase: 65 U/L (ref 25–125)
Bilirubin, Direct: 0.1 mg/dL (ref 0.01–0.4)
Bilirubin, Total: 0.3 mg/dL

## 2014-06-04 LAB — LIPID PANEL
CHOLESTEROL: 77 mg/dL (ref 0–200)
HDL: 39 mg/dL (ref 35–70)
LDL CALC: 20 mg/dL
Triglycerides: 89 mg/dL (ref 40–160)

## 2014-06-06 LAB — PROTEIN, TOTAL
ALBUMIN: 2.8 — AB (ref 3.5–5.2)
TOTAL PROTEIN: 5.5 g/dL — AB (ref 6.0–8.3)

## 2014-06-12 ENCOUNTER — Encounter: Payer: Self-pay | Admitting: Family Medicine

## 2014-06-12 ENCOUNTER — Other Ambulatory Visit: Payer: Self-pay | Admitting: Family Medicine

## 2014-06-12 LAB — BASIC METABOLIC PANEL
BUN: 13 mg/dL (ref 4–21)
CREATININE: 0.9 mg/dL (ref 0.5–1.1)
Glucose: 137 mg/dL
POTASSIUM: 4.7 mmol/L (ref 3.4–5.3)
SODIUM: 138 mmol/L (ref 137–147)

## 2014-06-12 LAB — CBC AND DIFFERENTIAL
HEMATOCRIT: 42 % (ref 36–46)
HEMOGLOBIN: 14.2 g/dL (ref 12.0–16.0)
Platelets: 223 10*3/uL (ref 150–399)
WBC: 6.3 10^3/mL

## 2014-06-12 LAB — LIPID PANEL
Cholesterol: 133 mg/dL (ref 0–200)
HDL: 40 mg/dL (ref 35–70)
LDL Cholesterol: 32 mg/dL
Triglycerides: 307 mg/dL — AB (ref 40–160)

## 2014-06-12 LAB — HEPATIC FUNCTION PANEL
ALK PHOS: 154 U/L — AB (ref 25–125)
ALT: 28 U/L (ref 7–35)
AST: 27 U/L (ref 13–35)
BILIRUBIN DIRECT: 0.2 mg/dL (ref 0.01–0.4)
Bilirubin, Total: 0.6 mg/dL

## 2014-06-12 LAB — HEMOGLOBIN A1C: HEMOGLOBIN A1C: 7.5 % — AB (ref 4.0–6.0)

## 2014-06-12 MED ORDER — ATORVASTATIN CALCIUM 40 MG PO TABS: 40.0000 mg | ORAL_TABLET | Freq: Every day | ORAL | Status: AC

## 2014-06-20 ENCOUNTER — Encounter: Payer: Self-pay | Admitting: Pharmacist

## 2014-06-26 ENCOUNTER — Encounter: Payer: Self-pay | Admitting: Family Medicine

## 2014-07-21 ENCOUNTER — Encounter: Payer: Self-pay | Admitting: Family Medicine

## 2014-07-21 ENCOUNTER — Non-Acute Institutional Stay: Payer: Medicare Other | Admitting: Family Medicine

## 2014-07-21 ENCOUNTER — Non-Acute Institutional Stay (INDEPENDENT_AMBULATORY_CARE_PROVIDER_SITE_OTHER): Payer: Medicare Other | Admitting: Family Medicine

## 2014-07-21 DIAGNOSIS — M545 Low back pain, unspecified: Secondary | ICD-10-CM

## 2014-07-21 DIAGNOSIS — Z86718 Personal history of other venous thrombosis and embolism: Secondary | ICD-10-CM

## 2014-07-21 DIAGNOSIS — L89153 Pressure ulcer of sacral region, stage 3: Secondary | ICD-10-CM

## 2014-07-21 DIAGNOSIS — L8993 Pressure ulcer of unspecified site, stage 3: Secondary | ICD-10-CM

## 2014-07-21 DIAGNOSIS — E1139 Type 2 diabetes mellitus with other diabetic ophthalmic complication: Secondary | ICD-10-CM

## 2014-07-21 DIAGNOSIS — L89109 Pressure ulcer of unspecified part of back, unspecified stage: Secondary | ICD-10-CM

## 2014-07-21 DIAGNOSIS — I10 Essential (primary) hypertension: Secondary | ICD-10-CM

## 2014-07-21 DIAGNOSIS — R5383 Other fatigue: Secondary | ICD-10-CM

## 2014-07-21 DIAGNOSIS — R5381 Other malaise: Secondary | ICD-10-CM

## 2014-07-21 DIAGNOSIS — R531 Weakness: Secondary | ICD-10-CM

## 2014-07-21 MED ORDER — TRAMADOL HCL 50 MG PO TABS: 50.0000 mg | ORAL_TABLET | Freq: Three times a day (TID) | ORAL | Status: AC | PRN

## 2014-07-21 NOTE — Assessment & Plan Note (Signed)
Has completed > 12 months of anticoagulation with no reports of bleeding.  - Continue xarelto especially with inactivity and IVC filter which increases risk of DVT.  - D/c if falls risk increases.

## 2014-07-21 NOTE — Addendum Note (Signed)
Addended by: Elenora Gamma on: 07/21/2014 03:46 PM   Modules accepted: Orders

## 2014-07-21 NOTE — Assessment & Plan Note (Addendum)
A1c 7.5 in August, up from 6.7 in May. Pt has been eating more since May and weight up 25 lbs since April (208 lbs currently). Also poor motivation for activity. -Goal weight is 150 pounds.  -Discussed again with patient to decrease intake and increase activity. - Nutr previously consulted. - Psych consulted for weight gain with atypical antipsychotic. - Continue lipitor  daily, metformin (clarify dose), and levemir 8u BID. - Will fwd to clinic staff to clarify when last ophthalmologic eye exam to f/u retinopathy. - Will need to clarify patient is getting sliding scale insulin if this is listed in Epic but not at [facility] >>she is getting SSI and blood sugars are well-controlled.

## 2014-07-21 NOTE — Assessment & Plan Note (Signed)
Constipation vs MSK. Rx'ed tramadol PRN and miralax increased dose for 3 days.

## 2014-07-21 NOTE — Progress Notes (Signed)
Patient ID: ULA COUVILLON, female   DOB: 1956/02/21, 58 y.o.   MRN: [id number]   Subjective:  Pt seen in NH for report of back pain. Pt states that she has had sharp BL paraspinal intermittent back pain for the last 1 week or so. She denies radiation, bowel incontinence, LE weakness, fevers, chills, decreased PO intake, and dysuria/polyuria.   She states that she has intermittent urinary incontinence that is stable.   She denies trauma or recent exertion. She is getting out of bed daily into her wheelchair.   Objective: Filed Vitals:   07/15/14 1035  BP: 108/76  Pulse: 100  Temp: 97.5 F (36.4 C)  Resp: 18   Gen: NAD, alert, cooperative with exam HEENT: NCAT CV: RRR, good S1/S2, no murmur Resp: CTABL, no wheezes, non-labored Abd: Soft, + BS, mild tenderness to palpation throughout without guarding Ext: No edema Neuro: Alert and interactive, toes up-pointing at rest, strength symmetric and 3-4/5 BL on LE.   Assessment and plan:  Back pain Back pain, acute over the last week per pt but chronic per discussion with nursing and in the notes here No red flags, no apprent need for imaging MSK vs constipation (reports of regular BMs from nursing staff)  Add tramadol, increase miralax to 1 cap BID X 3 days Trial of heating pad, encouraged out of bed to chair Continue to monitor.

## 2014-07-21 NOTE — Assessment & Plan Note (Signed)
Wheelchair bound. Pt and nurse state she sits up for 2-4 hours daily. D/c'ed from re-trial of PT due to cessation of improvement. Continue restorative care. Continue working to increase motivation.

## 2014-07-21 NOTE — Progress Notes (Addendum)
Patient ID: Krista [name], female   DOB: 1956/05/03, 58 y.o.   MRN: [id number] Subjective:   CC: PCP visit  HPI:   Patient denies complaints and reports feeling good today other than back pain that was addressed today by Dr Ermalinda Memos. She reports it is "a little". Denies pain elsewhere. Reports getting up 2-4 hours per day from bed with help. Feels mildly constipated.  Back pain Thought by Dr Ermalinda Memos to be constipation vs MSK. Rx'ed tramadol PRN and miralax increased dose for 3 days.  Decreased LE strength Per PT , no more progress beyond 3+ strength bilateral LE. Pt and nurse state she sits up out of bed for 2-4 hours daily.  Schizoaffective disorder with negative symptoms Have consulted psychiatry again for medication help with patient's negative symptoms, both in July and in early Sept 2015. Have not received consult note. In August, increased latuda from 40 to  daily. Uncertain yet if improved.  Sacral decubitus ulcer Pt reports this is not an issue. Denies pain.  DM Takes metformin and A1c most recently 7.5 in August, up from 6.7 in May. Pt has been eating much more since May.   Review of Systems - Per HPI.   Medications: Reviewed. All match those in EPIC list except metformin (listed in EPIC as metformin XR  QHS and in [facility] order sheet as  BID), acetaminophen (listed in [facility] order sheet as acetaminophen  PO Q6hr PRN and as 8 hour ER formulation  TID), and novolog SSI (listed in EPIC, not in [facility] orders).    Objective:  Physical Exam BP 108/76  Pulse 100  Temp(Src) 97.5 F (36.4 C)  Resp 18  Wt 208 lb (94.348 kg)  LMP 12/31/2009 GEN: NAD, lying in bed CV: RRR, II/VI systolic murmur, distant heart sounds due to habitus, 1+ bilateral radial and DP pulses PULM: CTAB, normal effort, listened to anterior lungs ABD: Obese, soft, mildly tender upper abdomen bilaterally HEENT: AT/[place], sclera clear, EOMI, o/p clear EXTR: No LE edema or  calf tenderness 3+/5 bilateral hip flexion Able to wiggle all toes Sensation to gross touch equal bilateral LE SKIN: Sacrum with blanching erythema and healed ulcer, no tenderness    Assessment:     Krista [name] is a 58 y.o. female with complex PMH here for PCP visit.    Plan:     Back pain Constipation vs MSK. Rx'ed tramadol PRN and miralax increased dose for 3 days.  Decreased LE strength Wheelchair bound. Pt and nurse state she sits up for 2-4 hours daily. D/c'ed from re-trial of PT due to cessation of improvement. Continue restorative care. Continue working to increase motivation.  Schizoaffective disorder with negative symptoms Have consulted psychiatry again for medication help with patient's negative symptoms and impressive weight gain, both in July and in early Sept 2015. Have not received consult note. In August, increased latuda from 40 to  daily. Uncertain yet if improved.  Sacral decubitus ulcer Healed. Pt reports this is not an issue. Denies pain. At risk for worsening given she is so frequently laying in bed. -Repositioning therapy, up to seated, and moisture control.  DM A1c 7.5 in August, up from 6.7 in May. Pt has been eating more since May and weight up 25 lbs since April (208 lbs currently). Also poor motivation for activity. - Goal weight is 150 pounds.  - Discussed again with patient to decrease intake and increase activity. - Nutr previously consulted. - Psych consulted for weight gain with atypical antipsychotic. -  Continue lipitor  daily, metformin (clarify dose), and levemir 8u BID. - Will fwd to clinic staff to clarify when last ophthalmologic eye exam to f/u retinopathy. - Will need to clarify patient is getting sliding scale insulin if this is listed in Epic but not at [facility] >>she is getting SSI and blood sugars are well-controlled.  Hypertension  Well controlled on current regimen. Tolerating current regiment.  - Continue current  Diltiazem 360 mg   History of DVT  Has completed > 12 months of anticoagulation with no reports of bleeding.  - Continue xarelto especially with inactivity and IVC filter which increases risk of DVT.  - D/c if falls risk increases.  # Health Maintenance:  - Need to clarify metformin and acetaminophen orders. Screening mammogram was done last year. GI was consulted around May 2015 for screening colonoscopy.  -Repeat flu shot ordered today. -Pt amenable to pap smear. Need to schedule with our office and get staff who can help get pt up to get speculum.  Follow-up: Follow up in 2 months for next nursing home PCP visit.   Leona Singleton, MD [facility]

## 2014-07-21 NOTE — Assessment & Plan Note (Addendum)
Healed. Pt reports this is not an issue. Denies pain. At risk for worsening given she is so frequently laying in bed. -Repositioning therapy, up to seated, and moisture control.

## 2014-07-21 NOTE — Assessment & Plan Note (Signed)
Well controlled on current regimen. Tolerating current regiment.  - Continue current Diltiazem 360 mg

## 2014-07-21 NOTE — Assessment & Plan Note (Signed)
Have consulted psychiatry again for medication help with patient's negative symptoms and impressive weight gain, both in July and in early Sept 2015. Have not received consult note. In August, increased latuda from 40 to  daily. Uncertain yet if improved.

## 2014-07-21 NOTE — Assessment & Plan Note (Addendum)
Back pain, acute over the last week per pt but chronic per discussion with nursing and in the notes here No red flags, no apprent need for imaging MSK vs constipation (reports of regular BMs from nursing staff)  Add tramadol, increase miralax to 1 cap BID X 3 days Trial of heating pad, encouraged out of bed to chair Continue to monitor.

## 2014-07-24 ENCOUNTER — Encounter: Payer: Self-pay | Admitting: Family Medicine

## 2014-07-26 ENCOUNTER — Telehealth: Payer: Self-pay | Admitting: Family Medicine

## 2014-07-26 DIAGNOSIS — E1139 Type 2 diabetes mellitus with other diabetic ophthalmic complication: Secondary | ICD-10-CM

## 2014-07-26 NOTE — Telephone Encounter (Signed)
Blue Team: Can you help me find out when Ms Southard' last diabetic eye exam was? Do I trust the Health Maintenance tab which states it ws due in 1967?  Also, she needs a pap smear. She lives at [facility] and has severe leg weakness. I was told that the pap smear would need to be done in our office. She has severe leg weakness and is morbidly obese. Do you think this can be done with a 30 minute visit slot and a couple Advertising copywriter along with you?    Thanks, Leona Singleton, MD

## 2014-07-26 NOTE — Telephone Encounter (Signed)
Tried calling mom's number and she is also at [facility].  Unsure of how we would be able to find out when her last eye exam was.  Also I'm sure we could do the pap for that amount of time, but I'm not sure if we can get that many of their staff to come in.  She would probably need to use the bed in room 12 if we do this. Jazmin Hartsell,CMA

## 2014-07-27 ENCOUNTER — Encounter (HOSPITAL_COMMUNITY): Payer: Self-pay | Admitting: Pharmacist

## 2014-07-28 NOTE — Progress Notes (Signed)
Patient ID: Krista [name], female   DOB: 08-Apr-1956, 58 y.o.   MRN: [id number] I discussed with Dr Ermalinda Memos.  I agree with their plans documented in their acute visit note.

## 2014-07-28 NOTE — Progress Notes (Signed)
Patient ID: Krista [name], female   DOB: [birth year], 58 y.o.   MRN: [id number] I discussed with Dr Benjamin Stain.  I agree with their plans documented in their regulatory visit note.

## 2014-07-28 NOTE — Addendum Note (Signed)
Addended by: Acquanetta Belling D on: 07/28/2014 03:23 PM   Modules accepted: Level of Service

## 2014-07-31 NOTE — Telephone Encounter (Signed)
Appt made on 08/03/14 for colpo clinic.  Confirmed with employee Psychiatric nurse at [facility]. Kent Braunschweig,CMA

## 2014-07-31 NOTE — Telephone Encounter (Signed)
Thanks. Also placed referral for ophthalmology as pt's problem list states "diabetes with retinopathy" and I am unaware of a recent diabetic eye exam.  Thanks.  Leona Singleton, MD

## 2014-07-31 NOTE — Telephone Encounter (Signed)
Received called from [facility] and pt is non ambulatory.  She requires a lift for transferring.  They can only bring her if she can be placed on table with a lift or by EMS.   Also I am unsure if she would be able to be seen in an exam chair at the eye doctor.  Please advise on what you would like for [place] to do. Lasalle Krista [name],CMA

## 2014-07-31 NOTE — Telephone Encounter (Signed)
I will fwd to Dr McDiarmid for suggestions. She is in a wheelchair daily for a few hours so I don't understand why she cannot be wheeled to [facility] and transferred by staff who transfer her from bed to wheelchair daily. But I may be missing something. Thanks.

## 2014-07-31 NOTE — Addendum Note (Signed)
Addended by: Simone Curia T on: 07/31/2014 10:07 AM   Modules accepted: Orders

## 2014-07-31 NOTE — Telephone Encounter (Addendum)
Spoke with Dr McDiarmid who suggests [facility] for her pap smear so there are more hands available to help. Can you call [facility] to set this up for her?   Krista Singleton, MD

## 2014-08-03 ENCOUNTER — Ambulatory Visit: Payer: Self-pay

## 2014-08-03 NOTE — Telephone Encounter (Signed)
Appt cancelled by Nursing Home resident today which is appropriate. As noted before, will await word from Dr McDiarmid about how to handle this.  Krista SingletonMaria T Makela Niehoff, MD

## 2014-08-08 NOTE — Telephone Encounter (Signed)
Let's plan on performing a bedside Pap with reflex HPV.  I have heard that Dr Dione BoozeGroat (ophth) can perform an diabetic ophthalmic exam with patients in wheelchair.  Let's make a consultation referral via [facility] to Dr Laruth BouchardGroat's office.  [facility] for patient.

## 2014-08-11 NOTE — Addendum Note (Signed)
Addended byPerley Jain: Jozelynn Danielson D on: 08/11/2014 12:03 PM   Modules accepted: Level of Service

## 2014-08-17 ENCOUNTER — Encounter: Payer: Self-pay | Admitting: Pharmacist

## 2014-09-15 ENCOUNTER — Ambulatory Visit: Payer: Self-pay | Admitting: Family Medicine

## 2014-09-19 ENCOUNTER — Telehealth: Payer: Self-pay | Admitting: Family Medicine

## 2014-09-19 NOTE — Telephone Encounter (Signed)
Will forward to MD. Jazmin Hartsell,CMA  

## 2014-09-19 NOTE — Telephone Encounter (Signed)
[name] is the [name] this [facility]. Will forward to him. Let me know how I can help. Thanks!  Krista SingletonMaria T Kimimila Tauzin, MD

## 2014-09-19 NOTE — Telephone Encounter (Signed)
Has problem with "where I pee"   Hurts to pee Please advise  [facility] doesn't have a phone in her room

## 2014-09-20 ENCOUNTER — Non-Acute Institutional Stay (INDEPENDENT_AMBULATORY_CARE_PROVIDER_SITE_OTHER): Payer: Medicare Other | Admitting: Family Medicine

## 2014-09-20 DIAGNOSIS — R300 Dysuria: Secondary | ICD-10-CM | POA: Insufficient documentation

## 2014-09-20 NOTE — Assessment & Plan Note (Signed)
Patient with reports of dysuria. No recent fever; suprapubic pain, change in mentation/mental status, or reported change in urine (Negative McGeer Criteria). Will continue to monitor and will not pursue further workup at this time.

## 2014-09-20 NOTE — Progress Notes (Signed)
   Subjective:    Patient ID: Krista [name], female    DOB: 1955-11-20, 58 y.o.   MRN: [id number]  HPI 58 year old female with PMH of Schizoaffective disorder, DM-2 w/ complications, and CVA seen today with complaints of pain with urination.  1) Dysuria  Patient reports that "it feels funny when I pee".    She states that this began yesterday.  No reported urinary urgency/frequency.   No abdominal pain, fever, chills.  She reports good PO intake.   Review of Systems Per HPI    Objective:   Physical Exam  General: obese, well appearing female, sitting in wheelchair drinking coffee. Cardiovascular: RRR. No murmurs, rubs, or gallops. Respiratory: CTAB. No rales, rhonchi, or wheeze. Abdomen: soft, nontender, nondistended.     Assessment & Plan:  See Problem List

## 2014-09-21 ENCOUNTER — Encounter: Payer: Self-pay | Admitting: Pharmacist

## 2014-10-05 ENCOUNTER — Non-Acute Institutional Stay (INDEPENDENT_AMBULATORY_CARE_PROVIDER_SITE_OTHER): Payer: Medicare Other | Admitting: Family Medicine

## 2014-10-05 DIAGNOSIS — S025XXA Fracture of tooth (traumatic), initial encounter for closed fracture: Secondary | ICD-10-CM

## 2014-10-05 NOTE — Assessment & Plan Note (Signed)
Non traumatic.  No bleeding or signs of infection at this time. Order for referral to dentist placed for tooth extraction.

## 2014-10-05 NOTE — Progress Notes (Signed)
   Subjective:    Patient ID: Krista [name], female    DOB: [birth year], 58 y.o.   MRN: [id number]  HPI 58 year old female with DM-2 w/ retinopathy, schizoaffective disorder, HTN, and HLD seen today at the request of the nurse.  Patient had a tooth fall out early today while eating.  Patient reports that she was eating when one of her teeth fell out (last upper tooth; cuspid # 11).  No reported trauma.  No bleeding or pain.  She is currently feeling well and has no complaints at this time.  Review of Systems Per HPI    Objective:   Physical Exam General: obese, well appearing; resting in bed. NAD. Mouth/Oral:  Upper dentition - portion of left cuspid noted; protrudes just below the gum line. Mild redness noted.  No focal abscess or bleeding noted.  Lower dentition - poor; caries noted.      Assessment & Plan:  See Problem List

## 2014-10-30 ENCOUNTER — Non-Acute Institutional Stay (INDEPENDENT_AMBULATORY_CARE_PROVIDER_SITE_OTHER): Payer: Medicare Other | Admitting: Family Medicine

## 2014-10-30 DIAGNOSIS — E785 Hyperlipidemia, unspecified: Secondary | ICD-10-CM

## 2014-10-30 DIAGNOSIS — R0689 Other abnormalities of breathing: Secondary | ICD-10-CM

## 2014-10-30 DIAGNOSIS — I10 Essential (primary) hypertension: Secondary | ICD-10-CM

## 2014-10-30 DIAGNOSIS — R0609 Other forms of dyspnea: Secondary | ICD-10-CM

## 2014-10-30 DIAGNOSIS — E669 Obesity, unspecified: Secondary | ICD-10-CM

## 2014-10-30 DIAGNOSIS — R0600 Dyspnea, unspecified: Secondary | ICD-10-CM

## 2014-10-30 DIAGNOSIS — S025XXD Fracture of tooth (traumatic), subsequent encounter for fracture with routine healing: Secondary | ICD-10-CM

## 2014-10-30 DIAGNOSIS — R0989 Other specified symptoms and signs involving the circulatory and respiratory systems: Secondary | ICD-10-CM | POA: Insufficient documentation

## 2014-10-30 DIAGNOSIS — F259 Schizoaffective disorder, unspecified: Secondary | ICD-10-CM

## 2014-10-30 NOTE — Assessment & Plan Note (Signed)
With nasal congestion and increased rhinorrhea, mild subjective fever, and some coarseness/tightness throughout all lung fields, but otherwise well-appearing with no fever per nurse from 12/26 (most recent vitals). Only 5 days symptoms. - Fluids, rest. - Flonase, albuterol PRN, and asked nursing to collect vitals today. - [facility] patient early this week. If not improving in a few days, would consider further workup / antibiotics.

## 2014-10-30 NOTE — Assessment & Plan Note (Signed)
-   Continue lipitor. Due for recheck next June.

## 2014-10-30 NOTE — Progress Notes (Signed)
Patient ID: Charna ElizabethDebra J Mollenhauer, female   DOB: October 02, 1956, 58 y.o.   MRN: [id number] Subjective:   CC: Nursing home visit  HPI:   This is a 58 y.o. female who I am visiting in the Nursing Home for her regular nursing home visit.  Cold symptoms Present about 1 week, including nasal congestion, cough and nasal congestion productive of mucus (unsure of appearance), and feeling like her head is "warm" with no neck stiffness, dyspnea, nausea, vomiting, diarrhea, or chest pain.   Broken tooth 12/3 Since this time, patient has had tooth extracted 1-2 weeks ago per patient by dentist. She currently does not have any pain or issues with this area.  No other complaints today.  Review of Systems - Per HPI.  PMH - Schizoaffective disorder, obesity, hypertriglyceridemia, HTN, HLD, h/o DVT, hiatal hernia, GERD, DM type II with retinopathy and peripheral neuropathy, DJD bilateral knees, CVA.    Med[facility] - [facility]  Objective:  Physical Exam BP 156/85 mmHg  Pulse 92  Temp(Src) 97.2 F (36.2 C)  Resp 19  SpO2 99%  LMP 12/31/2009 GEN: NAD, lying in bed CV: RRR, no m/r/g PULM: Coarse throughout with decreased air entry but no increase work of breathing; occasional wet-sounding ocugh HEENT: AT/[place], nasal congestion easily heard, EOMI, o/p mildly tacky, neck supple ABD: S/mildly tender generalized, nondistended, morbidly obese EXTR: No LE edema or calf tenderness; 4/5 strength bilateral hip flexors and dorsiflexion of feet NEURO: Awake, alert, no focal deficits, normal speech and interacts appropriately     Assessment:     Charna ElizabethDebra J Koppelman is a 58 y.o. female seen in Nursing Home for routine visit.    Plan:     # See problem list and after visit summary for problem-specific plans. - F/u diabetes and HTN at next visit.   # Health Maintenance: Needs pap smear (at next visit will plan to perform) and f/u of abnormal mammogram. [facility] stating that she had abnoraml evaluation 03/10/2014 but they were unable to reach her to ensure return for f/u exam. Will place order for nursing to contact [facility] to set up this appointment.  Follow-up: Follow up in 2 months for next routine visit.   Leona SingletonMaria T Tymeshia Awan, MD [facility]

## 2014-10-30 NOTE — Assessment & Plan Note (Signed)
Values creeping up on current regimen, possibly due to metabolic syndrome/weight gain. - Continue current diltiazem 360mg . - Continue working on weight.

## 2014-10-30 NOTE — Assessment & Plan Note (Signed)
Weight continues to increase. Patient states she cannot exercise much due to knee pain and being bed-bound mostly. Goal weight previously listed as 150 lbs. - Continue to encourage getting up frequently for weight and h/o sacral decubitus ulcer. - Encourage better dietary habits. Difficult as we have discussed this many times. Nutrition has also met with her. - F/u Diabetes at next visit.

## 2014-10-30 NOTE — Assessment & Plan Note (Signed)
Tooth since extracted and patient has not had further issues. Resolved.

## 2014-10-30 NOTE — Assessment & Plan Note (Addendum)
Negative symptoms and metabolic syndrome from latuda likely partly to blame for worsening obesity. Had reconsulted psychiatry last visit for help with managing neg symptoms. Had also increased latuda from 40 to 80mg  daily, but as this can worsen metabolic syndrome, this is back down to 40mg  daily and wellbutrin XL 150mg  daily added.

## 2014-11-01 ENCOUNTER — Non-Acute Institutional Stay (INDEPENDENT_AMBULATORY_CARE_PROVIDER_SITE_OTHER): Payer: Medicare Other | Admitting: Family Medicine

## 2014-11-01 DIAGNOSIS — R05 Cough: Secondary | ICD-10-CM | POA: Insufficient documentation

## 2014-11-01 DIAGNOSIS — R059 Cough, unspecified: Secondary | ICD-10-CM

## 2014-11-01 NOTE — Progress Notes (Signed)
   Subjective:    Patient ID: Krista [name], female    DOB: November 25, 1955, 58 y.o.   MRN: [id number]  HPI 58 year old female with a complicated past medical history including hypertension, hyperlipidemia, DM 2 with complications, and schizoaffective disorder seen today for follow-up.  Patient was recently seen on 12/28 by her primary provider.  At that time she was experiencing URI symptoms.  Her PCP elected to treat her conservatively with Flonase and PRN Albuterol.  PCP also recommended close follow-up.  Patient seen for follow-up of her URI symptoms today.  Patient reports that she continues to be bothered by productive cough.  No reported fevers although she does feel quite warm/sweaty frequently.  No other complaints at this time. No reports of shortness of breath or chest pain.  She has not received any albuterol per her report.  Review of Systems  Per HPI    Objective:   Physical Exam Vitals: T 98.9, HR 104, RR 20, BP 127/91. Exam: General: Obese female resting in bed, no acute distress. Cardiovascular: Mild tachycardia. No murmurs. Respiratory: Diffuse expiratory wheezing noted. No rhonchi appreciated.     Assessment & Plan:  See Problem List.

## 2014-11-01 NOTE — Assessment & Plan Note (Signed)
Patient with a history of tobacco abuse. Patient now experiencing productive cough and diffuse wheezing on exam.  No increased work of breathing. Symptoms consistent with COPD exacerbation likely as a result of viral illness. Treating with DuoNeb's, Tessalon perles, prednisone 50 mg 5 days, and doxycycline. Obtaining chest x-ray for further evaluation. Patient will need close follow-up.

## 2014-11-07 NOTE — Progress Notes (Signed)
Patient ID: Krista ElizabethDebra J Marcello, female   DOB: 12/17/1955, 59 y.o.   MRN: [id number] I discussed with  Dr Adriana Simasook.  I agree with their plans documented in their acute visit  Note. Patient improved with empiric treatment for Acute Exacerbation of COPD.

## 2014-11-07 NOTE — Progress Notes (Signed)
Patient ID: Krista ElizabethDebra J Jaber, female   DOB: 06/23/1956, 59 y.o.   MRN: [id number]  I reviewed Dr Lucienne Minkshekkekandam's plan.  I agree with it.   Patient's CHEST XRAY did not show an acute process.  Pt reponded well to empiric therapy for Acute exacerbation COPD.

## 2014-11-12 IMAGING — CR DG CHEST 1V PORT
1 series · 1 of 1 positions shown · non-contrast
Comparison: One-view chest x-ray 01/02/2013.

CLINICAL DATA: Acute mental status changes.  Hypotension.

PORTABLE CHEST - 1 VIEW [DATE]/7220 7220 hours:

[AP]
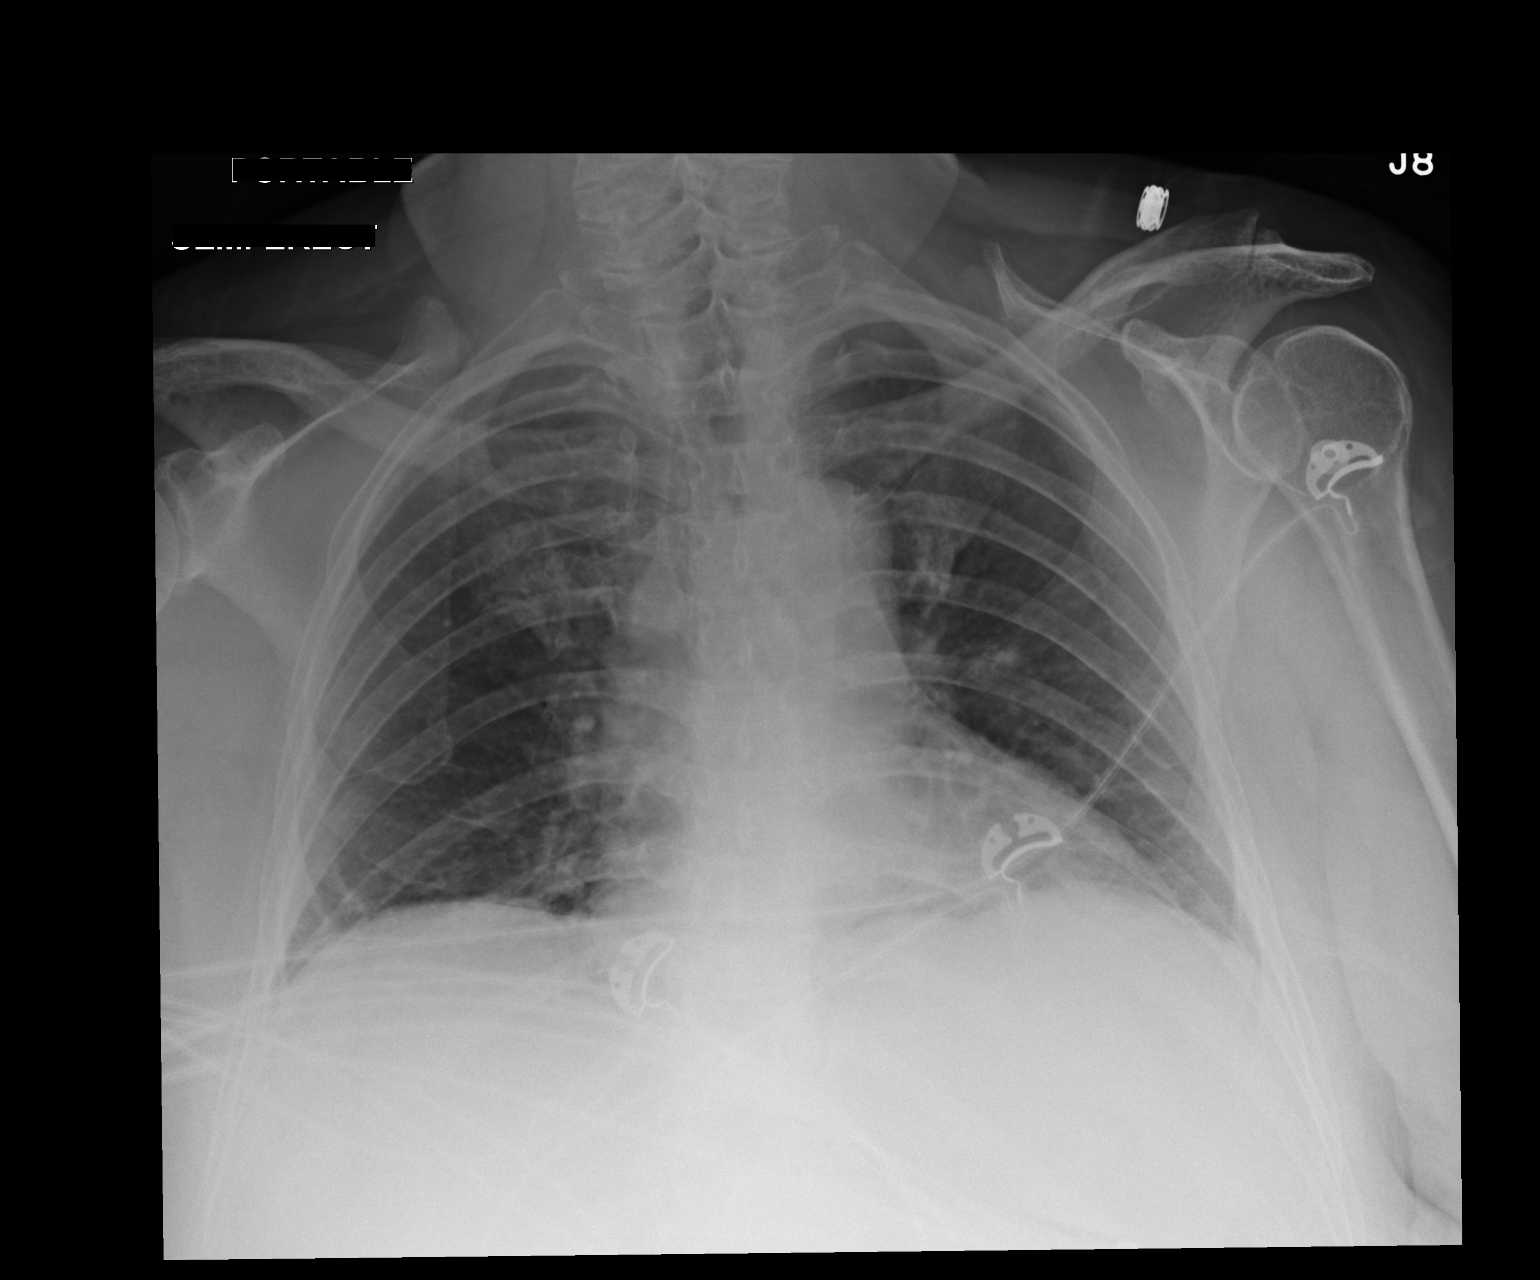

[1 of 1 positions shown; findings below may reference images not displayed]

FINDINGS: Suboptimal inspiration which accounts for atelectasis at
the bases and accentuates the cardiac silhouette.  Taking this into
account, cardiac silhouette mildly enlarged but stable.  Lungs
otherwise clear.  Pulmonary vascularity normal.
IMPRESSION: Suboptimal inspiration accounts for bibasilar atelectasis.  No
acute cardiopulmonary disease otherwise.  Stable cardiomegaly
without pulmonary edema.

## 2014-11-13 ENCOUNTER — Encounter: Payer: Self-pay | Admitting: Pharmacist

## 2014-11-13 IMAGING — US US RENAL
1 series · 14 of 25 positions shown · non-contrast
Comparison: None.

CLINICAL DATA: Elevated BUN and creatinine.

RENAL/URINARY TRACT ULTRASOUND COMPLETE

[Series 1: us renal · 0.31mm/px · 14 of 29 slices shown]
[im 1/29]
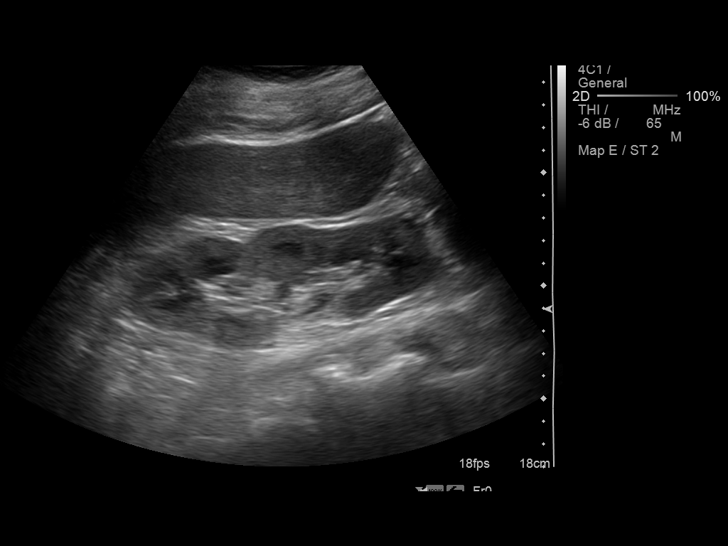
[im 3/29]
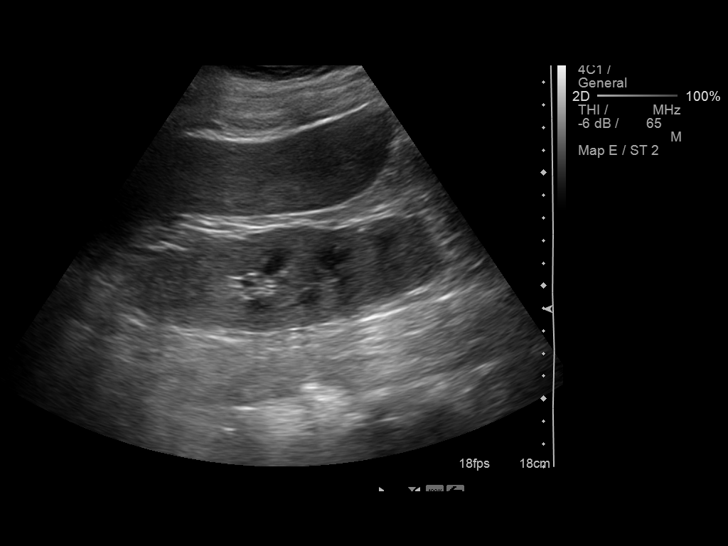
[im 5/29]
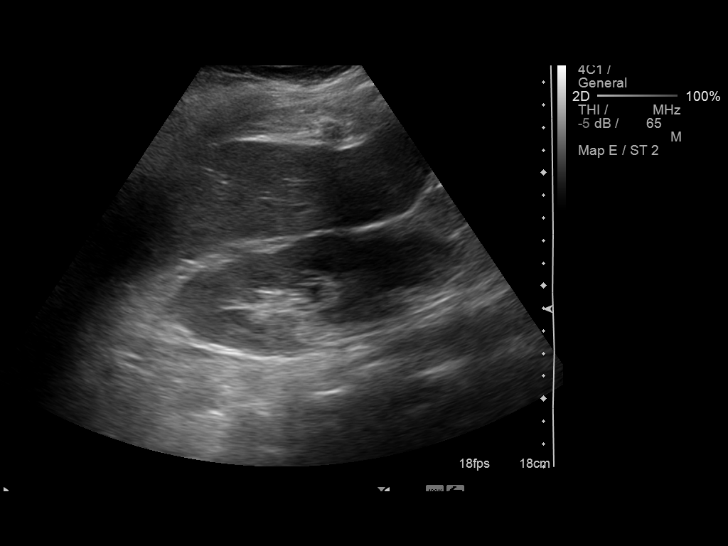
[im 8/29]
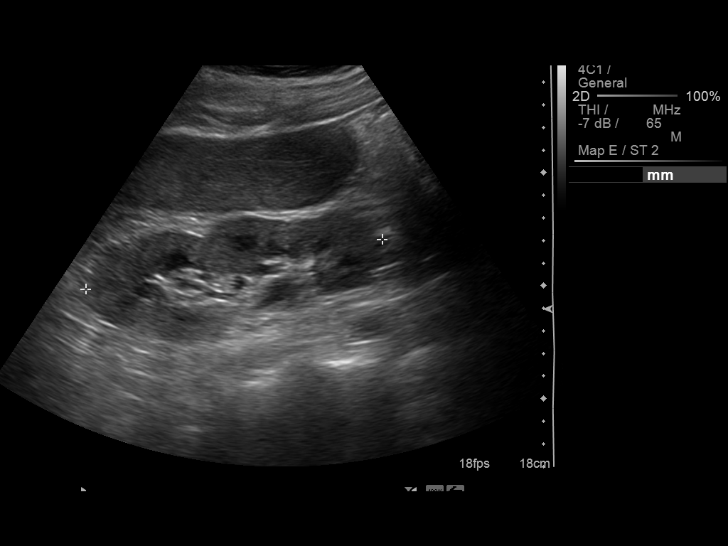
[im 10/29]
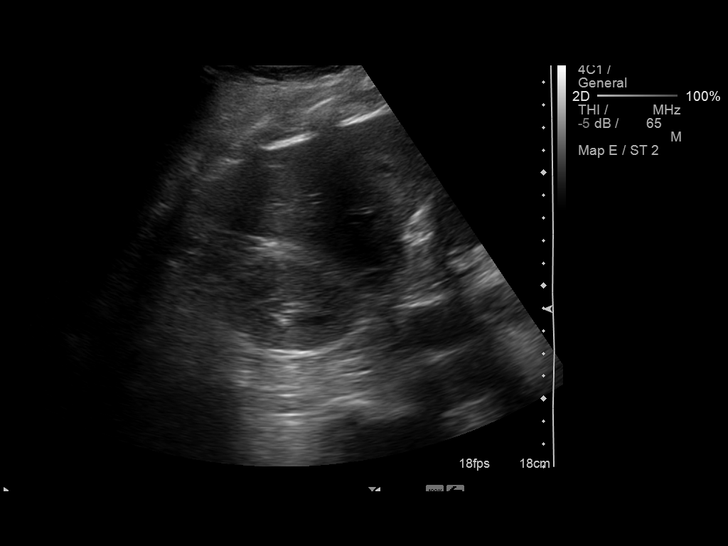
[im 11/29]
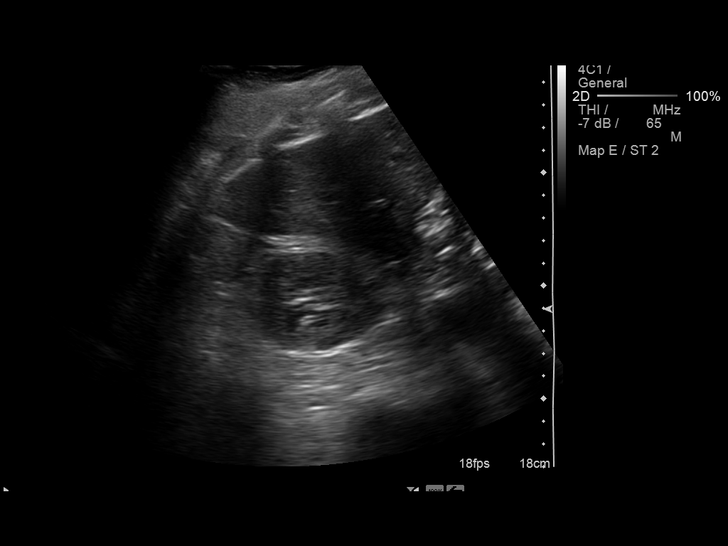
[im 13/29]
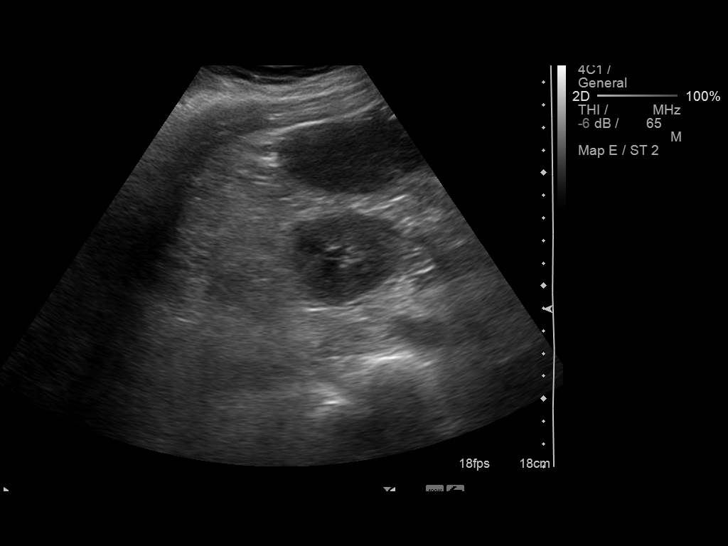
[im 16/29]
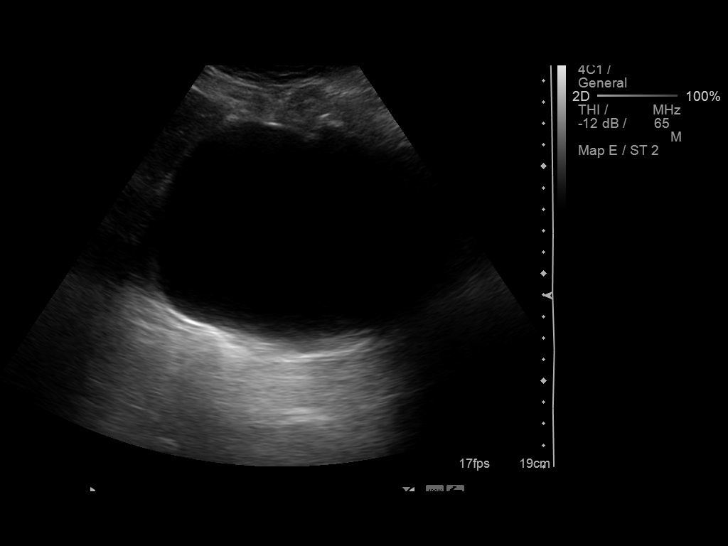
[im 18/29]
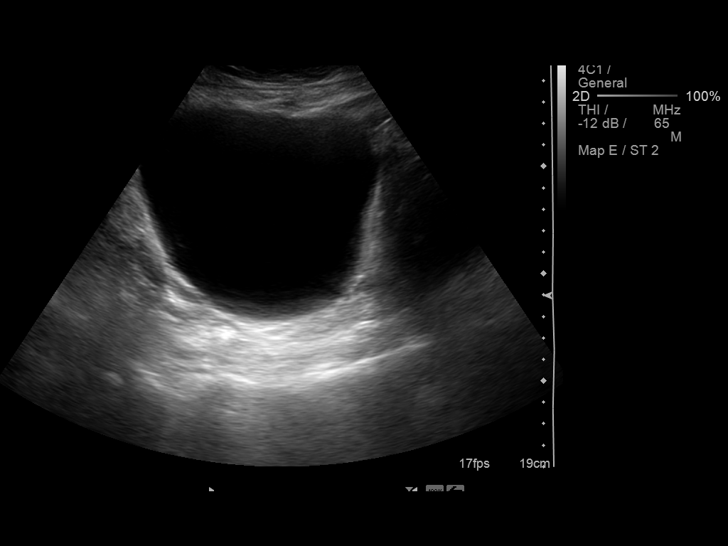
[im 19/29]
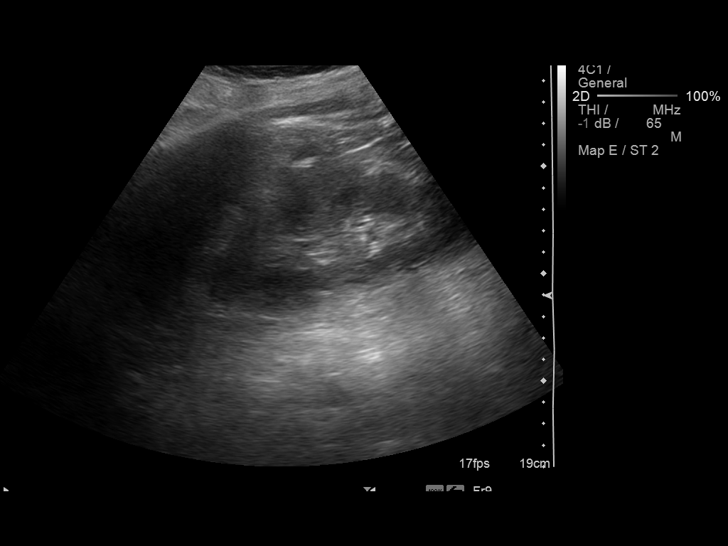
[im 22/29]
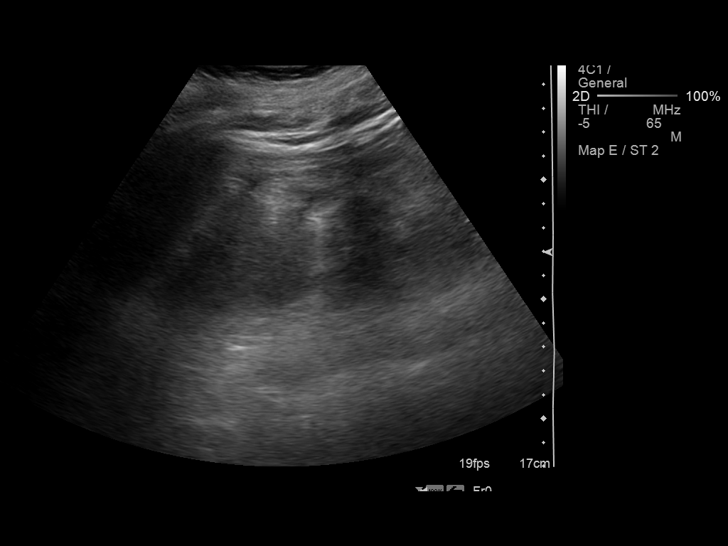
[im 24/29]
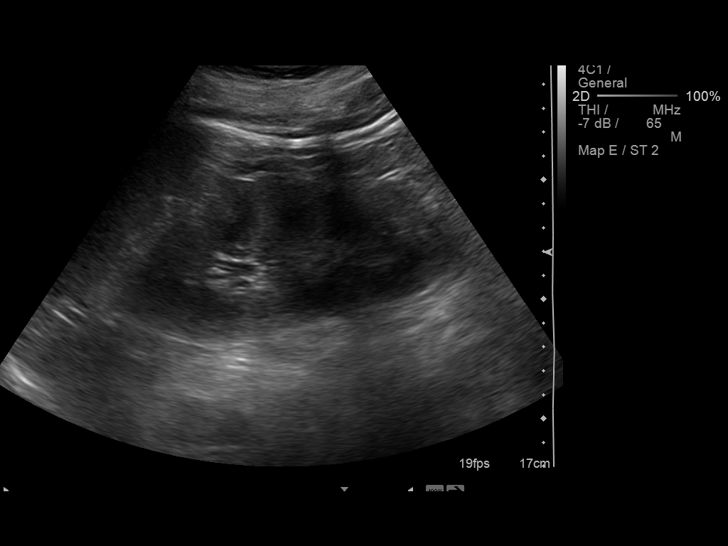
[im 26/29]
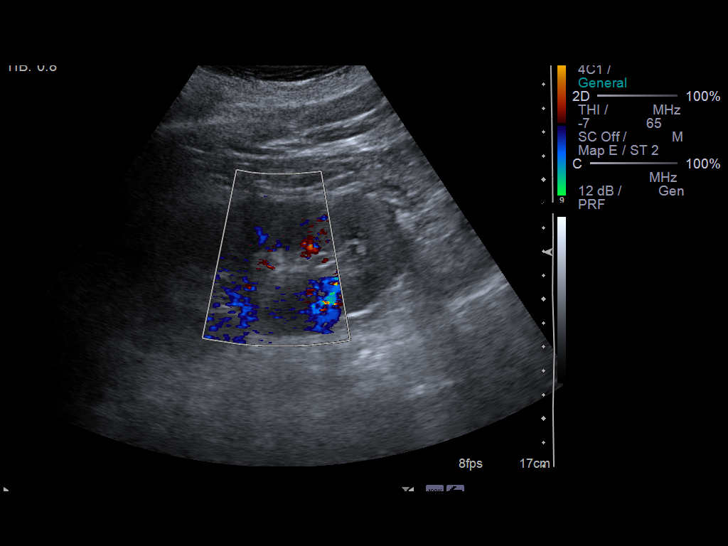
[im 29/29]
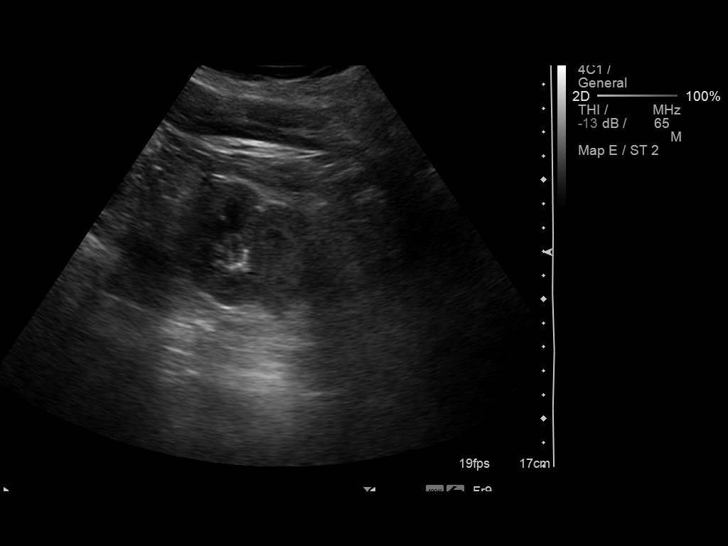

[14 of 25 positions shown; findings below may reference images not displayed]

FINDINGS: Right Kidney:  13.3 cm in length.  Normal renal cortical thickness
with slight increased echogenicity but no focal renal lesions or
hydronephrosis.

Left Kidney:  13.9 cm in length.  Normal renal cortical thickness
but slight increased echogenicity.  No renal lesion or
hydronephrosis.

Bladder:  Normal.
IMPRESSION: Slight increased echogenicity of both kidneys suggesting medical
renal disease.  No hydronephrosis.

## 2014-11-14 ENCOUNTER — Encounter: Payer: Self-pay | Admitting: Family Medicine

## 2014-11-14 ENCOUNTER — Non-Acute Institutional Stay: Payer: Medicare Other | Admitting: Family Medicine

## 2014-11-14 DIAGNOSIS — Z86718 Personal history of other venous thrombosis and embolism: Secondary | ICD-10-CM

## 2014-11-14 DIAGNOSIS — I10 Essential (primary) hypertension: Secondary | ICD-10-CM

## 2014-11-14 DIAGNOSIS — I639 Cerebral infarction, unspecified: Secondary | ICD-10-CM | POA: Diagnosis not present

## 2014-11-14 DIAGNOSIS — F259 Schizoaffective disorder, unspecified: Secondary | ICD-10-CM

## 2014-11-14 DIAGNOSIS — E09319 Drug or chemical induced diabetes mellitus with unspecified diabetic retinopathy without macular edema: Secondary | ICD-10-CM

## 2014-11-14 DIAGNOSIS — R928 Other abnormal and inconclusive findings on diagnostic imaging of breast: Secondary | ICD-10-CM

## 2014-11-14 DIAGNOSIS — R159 Full incontinence of feces: Secondary | ICD-10-CM

## 2014-11-14 HISTORY — DX: Full incontinence of feces: R15.9

## 2014-11-14 NOTE — Assessment & Plan Note (Signed)
[facility].

## 2014-11-14 NOTE — Assessment & Plan Note (Signed)
Stable Tolerating Rivaroxaban and statin Adequate BP control with Diltiazem  Continue all three medications.  Will need to perform MoCA or MMSE next visit to get a general sense of patient's ability to particpate in informed consent process.

## 2014-11-14 NOTE — Assessment & Plan Note (Signed)
Stable. Tolerating Bupropion started by [facility]CEP in Fall 2015. Tolerating Latuda.  (+) metabolic syndrome being addressed with medications.

## 2014-11-14 NOTE — Assessment & Plan Note (Addendum)
Adequate glycemic control.  Pt is tolerating the current medication regiment. Continue current treatment plan.   Diabetes Prevention             Daily Aspirin: on rivaroxaban              Statin: yes             Dental evaluation in 6-months: yes             Recent eGFR: 0.9 in 06/2014             ACEI: No.  Eye Exam: No  Foot Exam: yes   Diet pattern: No added sugar  Need to arrange ophthalmology outpatient consultation to follow up diagnosis of diabetic retinopathy.   

## 2014-11-14 NOTE — Progress Notes (Signed)
Patient ID: Krista [name], female   DOB: 1956-07-15, 59 y.o.   MRN: [id number] [facility]  Visit  Provider: Judie Petit. Benjamin Stain, MD Location of Care: [facility] Visit Information: a scheduled routine follow-up visit Patient accompanied by patient Source(s) of information for visit: patient, nursing home and past medical records  Chief Complaint:  Chief Complaint  Patient presents with  . OTHER    Regulatory Visit    HISTORY OF PRESENT ILLNESS: Problem List Items Addressed This Visit      Schizoaffective disorder - Longstanding diagnosis - On Latuda 40 mg daily and bupropion - Evaluated by NCEP without further recommendations for lack of motivation except reduction in Latuda dose - (+) weight gain.  (+) hyperlipidemia, (+) truncal obesity, (+) Hypertension, (+) DMT2 - No delusions or hallucination   HYPERTENSION, BENIGN SYSTEMIC - Chroinc problem - No chest pain, shortness of breath, new face or limb weakness or numbness - taking diltiazem - variable leg swelling bilateral - No excericse.  On no-added salt diet.    History of DVT (deep vein thrombosis) - Last DVT was in Right leg in July 2014. - Has vena cava filter. On rivaroxaban daily - No bleeding.  No blood in stool  - no new swelling in legs.  No shortness of breath or chest pains.     Encopresis - Primary - Longstanding issue since admission to NH - Patient lack awareness when she defecates.  - No diarrhea.  Stools soft but formed.    Diabetes mellitus with diabetic retinopathy - CBGs running 103 Fasting to 420 evening preprandial. - On Levemir 8 units SQ twice a day  - On SSI Novolog usually 2 to 4 units, occasional 12 units with high preprandial CBG - No hypoglycemia - Denies change in vision.    CVA (cerebral vascular accident) - Subcentimeter areas of acute infarction in frontoparietal white matter bilaterally, R>Left. - On rivaroxaban.  - See symptoms of Hypertension and diabetes.  -  cognition uncertain.     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  - Patient denies pain over sacral area.   Outpatient Encounter Prescriptions as of 11/14/2014  Medication Sig  . acetaminophen (TYLENOL) 650 MG CR tablet Take 650 mg by mouth 3 (three) times daily.   Marland Kitchen atorvastatin (LIPITOR) 40 MG tablet Take 1 tablet (40 mg total) by mouth daily.  Marland Kitchen buPROPion (WELLBUTRIN SR) 150 MG 12 hr tablet Take 150 mg by mouth daily. 1 tab daily for depression  . calcium citrate (CALCITRATE - DOSED IN MG ELEMENTAL CALCIUM) 950 MG tablet Take 400 mg of elemental calcium by mouth daily.  . Cholecalciferol (VITAMIN D3) 50000 UNITS CAPS Take 50,000 Units by mouth every 30 (thirty) days.  Marland Kitchen diltiazem (TIAZAC) 360 MG 24 hr capsule Take 360 mg by mouth daily.  . famotidine (PEPCID) 40 MG tablet Take 40 mg by mouth at bedtime.  . fluticasone (VERAMYST) 27.5 MCG/SPRAY nasal spray Place 2 sprays into the nose daily.  . insulin aspart (NOVOLOG) 100 UNIT/ML injection Inject 0-12 Units into the skin 3 (three) times daily before meals. Sliding scale: 0-150: 0 units 151-200: 2 units 201-250: 4 units 251-300: 6 units 301-350: 8 units 351-400: 10 units 401 and greater: 12 units  . insulin detemir (LEVEMIR) 100 UNIT/ML injection Inject 8 Units into the skin 2 (two) times daily.  Marland Kitchen lidocaine (LIDODERM) 5 % Place 1 patch onto the skin daily.  Apply to left knee Remove & Discard patch within 12 hours or as directed by MD  . lurasidone (LATUDA) 40 MG TABS tablet Take 40 mg by mouth daily with breakfast.   . metFORMIN (GLUMETZA) 500 MG (MOD) 24 hr tablet Take 1,000 mg by mouth 2 (two) times daily with a meal.   . Multiple Vitamin (MULTIVITAMIN WITH MINERALS) TABS Take 1 tablet by mouth daily.  . polyethylene glycol powder (GLYCOLAX/MIRALAX) powder Take 17 g by mouth daily.  . Rivaroxaban (XARELTO) 20 MG TABS tablet Take 1 tablet (20 mg total)  by mouth daily.  Marland Kitchen albuterol (PROVENTIL HFA;VENTOLIN HFA) 108 (90 BASE) MCG/ACT inhaler Inhale 2 puffs into the lungs every 4 (four) hours as needed for wheezing or shortness of breath.  . bisacodyl (DULCOLAX) 10 MG suppository Place rectally as needed for moderate constipation.  . Magnesium Hydroxide (MILK OF MAGNESIA PO) Take by mouth.  . traMADol (ULTRAM) 50 MG tablet Take 1 tablet (50 mg total) by mouth every 8 (eight) hours as needed. (Patient not taking: Reported on 11/14/2014)  . [DISCONTINUED] acetaminophen (TYLENOL) 325 MG tablet Take 650 mg by mouth every 6 (six) hours as needed.   . [DISCONTINUED] benzonatate (TESSALON) 100 MG capsule Take by mouth 3 (three) times daily as needed for cough.   History Patient Active Problem List   Diagnosis Date Noted  . Encopresis 11/14/2014  . Cough 11/01/2014  . History of DVT (deep vein thrombosis) 06/22/2013  . Weakness 05/18/2013  . CVA (cerebral vascular accident) 05/17/2013  . Presence of IVC filter 05/17/2013  . Diabetic peripheral neuropathy associated with type 2 diabetes mellitus 10/07/2012  . 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 Hyperlipidemia 02/16/2008  . DEGENERATIVE JOINT DISEASE, BOTH KNEES, SEVERE 10/12/2007  . DIABETES MELLITUS, TYPE II, WITH RETINOPATHY 12/31/2006  . HYPERTRIGLYCERIDEMIA 12/31/2006  . Obesity 12/31/2006  . Schizoaffective disorder 12/31/2006  . HYPERTENSION, BENIGN SYSTEMIC 12/31/2006  . GASTROESOPHAGEAL REFLUX, NO ESOPHAGITIS 12/31/2006  . HERNIA, HIATAL, NONCONGENITAL 12/31/2006  . INCONTINENCE, URGE 12/31/2006   Past Medical History  Diagnosis Date  . Arthritis   . Hyperlipidemia   . Hypertension   . Allergy   . GERD (gastroesophageal reflux disease)   . Back pain   . Type II diabetes mellitus   . Schizophrenia     Krista [name] 02/17/2013  . 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    RLE DVT   . Anemia, chronic disease 05/18/2013    Most likely cause given recent illness and chronic  sacral ulcer   . Gastrostomy tube in place 06/22/2013    04/08/2013: Placement of percutaneous 44F pull-through gastrostomy tube. For severe protein calorie malnutrition.    . Abdominal pain 11/25/2013  . Chest pain 01/31/2013  . Elevated alkaline phosphatase level 08/18/2013  . Gastrostomy tube in place 06/22/2013    04/08/2013: Placement of percutaneous 44F pull-through gastrostomy tube. For severe protein calorie malnutrition.    Marland Kitchen PEG (percutaneous endoscopic gastrostomy) status 06/22/2013  . Presence of IVC filter 05/17/2013    Placed for DVT with hematuria on anticoagulation   . Schizoaffective disorder 12/31/2006    Qualifier: Diagnosis of  By: Haydee Salter    . Septic shock(785.52) 03/11/2013  . Severe sepsis with septic shock 01/24/2013  . TOBACCO DEPENDENCE 12/31/2006    Quit date 03/09/2013-most recent hospitalization.     . Wheezing symptom 02/12/2012  . Acute kidney injury 01/24/2013  . Hematuria, gross 01/20/2013  . Sacral decubitus ulcer 05/17/2013  . SACROILIITIS, RIGHT 07/19/2010  Qualifier: Diagnosis of  By: Humberto SealsSaxon NP, Darl PikesSusan    . Skin abrasion 05/17/2013  . Acute delirium 01/24/2013  . Rectal trauma 10/07/2012  . Acute respiratory failure 03/25/2013  . HCAP (healthcare-associated pneumonia) 03/25/2013  . SINUS TACHYCARDIA 02/15/2010    Qualifier: Diagnosis of  By: Lanier PrudeBolden  MD, Cathrine Musteraineisha    . Tracheostomy status 04/04/2013  . Acute pain of left shoulder 02/15/2014  . Encopresis 11/14/2014   Past Surgical History  Procedure Laterality Date  . Tubal ligation    . Tracheostomy  03/25/2013    during prolonged hospitalization when patient was intubated and required bronchoscopy  . Gastrostomy tube placement  04/08/2013     Placement of percutaneous 1F pull-through gastrostomy tube. For severe protein calorie malnutrition.    Family History  Problem Relation Age of Onset  . Diabetes Mother   . COPD Mother   . Heart disease Mother   . Hyperlipidemia Mother   . Hypertension Mother   .  Arthritis Mother     reports that she has quit smoking. Her smoking use included Cigarettes. She smoked 1.00 pack per day. She has never used smokeless tobacco. She reports that she does not drink alcohol or use illicit drugs.  Basic Activities of Daily Living  Dependent or needs assistance in dressing, bathing, toileting and meal prep  Instrumental Activities of Daily Living Dependent or needs assistance in finances, housekeeping, shopping, telephoning and travel outside home    Falls in the past six months:   no  Diet:  general Supplemental shakes:  no  Review of Systems  See HPI General: Denies fevers Eyes: Denies  blurred vision Cardiovascular: Denies complaints, chest pains, palpitations, dyspnea on exertion, orthopnea, peripheral edema.  Respiratory: Denies cough, sputum, dyspnea.  Gastrointestinal: Denies abd pain, bloating, constipation, diarrhea.    PHYSICAL EXAM:. Wt Readings from Last 3 Encounters:  07/18/14 208 lb (94.348 kg)  02/11/14 183 lb (83.008 kg)  12/05/13 168 lb 6.4 oz (76.386 kg)   Temp Readings from Last 3 Encounters:  11/11/14 97 F (36.1 C)   10/18/14 97.2 F (36.2 C)   07/18/14 97.5 F (36.4 C)    BP Readings from Last 3 Encounters:  11/11/14 138/80  10/18/14 156/85  07/18/14 108/76   Pulse Readings from Last 3 Encounters:  11/11/14 81  10/18/14 92  07/18/14 100    General: No acute distress, well nourished, pleasant, lying on left side flat without difficuty talking or increase WOB HEENT:  No scleral icterus, no nasal secretions,   CV:  RRR, no murmur, no ankle swelling RESP: No resp distress or accessory muscle use.  Clear to ausc bilat. No wheezing, no rales, no rhonchi.  ABD:  Soft, Non-tender, non-distended, +bowel sounds, no masses MSK:  No back pain, no joint pain.  No joint swelling or redness Skin:  ~ 1 cm circular skin defect over midline sacrum with dermal appearing base, no undermining, raised border, no drainage,  nontender, no surrounding erythema.  Psych:  Cooperative, Social conversation,  MMSE or MoCa:  unknown / 30 Years of Education: not asked  Assessment and Plan:   See Problem List for individual problem's assessment and plans.   Code Status:    (+)DNR Emergency contact:  While patient's mother, Krista [name], is listed as primary contact, Ms Wards diminished capacities may limit her ability to assist.   Follow Up:  Next 60  days unless acute issues arise.

## 2014-11-14 NOTE — Assessment & Plan Note (Signed)
Adequate blood pressure control.  No evidence of new end organ damage.  Tolerating Diltiazem without significant adverse effects.  Plan to continue current blood pressure regiment.  Will start a low dose ACEI given diabetes, Lisinopril 2.5 mg daily.  Check BMET in 1 to 2 weeks.

## 2014-11-14 NOTE — Assessment & Plan Note (Addendum)
Chronic problem Stable.  Lacks awareness when defecating.  Accompanying urinary incontinence Possible contributing conditions include stroke, cognitive impairment, diabetic neuropathy Need to rule out fecal impaction.   Recommend decreasing Miralax from daily dosing

## 2014-11-14 NOTE — Assessment & Plan Note (Signed)
Chronic Stable. Tolerating Rivaroxaban without adverse effect Continue NOAC

## 2014-11-16 IMAGING — CT CT HEAD W/O CM
1 of 2 series · 13 of 30 positions shown, 17 images · non-contrast
Comparison: 01/19/2013 CT

CLINICAL DATA: 56-year-old female with altered mental status.

CT HEAD WITHOUT CONTRAST
TECHNIQUE: Contiguous axial images were obtained from the base of
the skull through the vertex without contrast.

[Series 2: brain · axial · 0.47mm/px · z∈[-213,-74]mm · 13 of 32 slices shown, 17 images]
[im 3/32  brain]
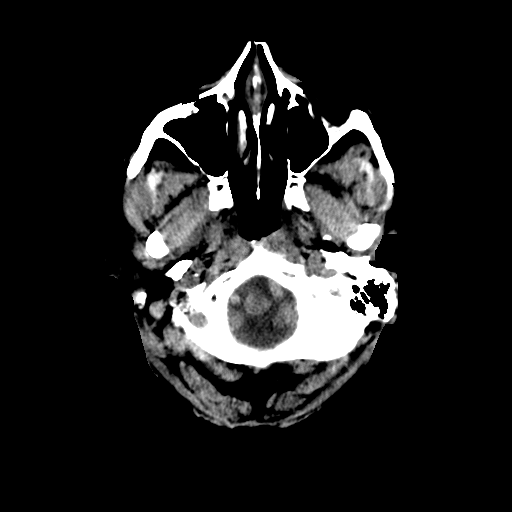
[im 3/32  bone]
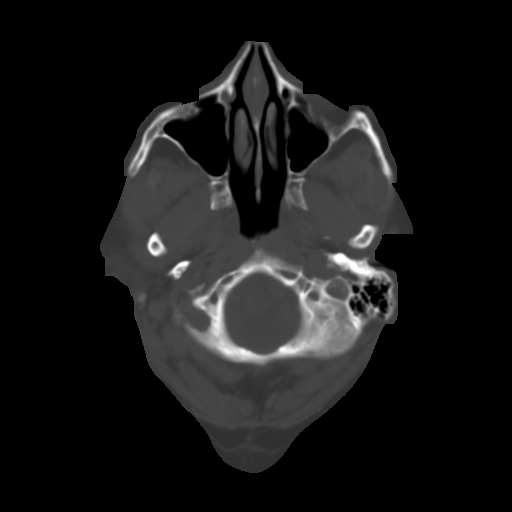
[im 5/32  brain]
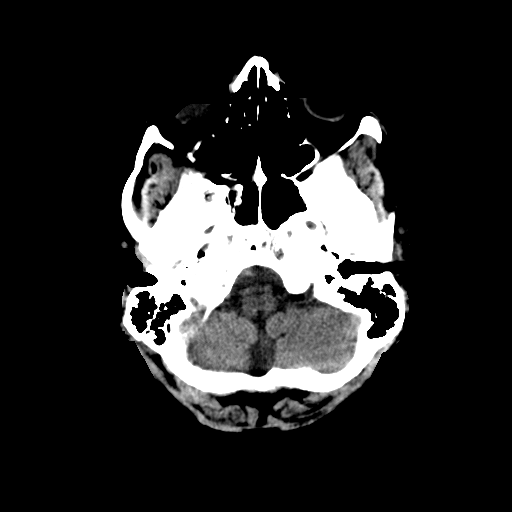
[im 7/32  brain]
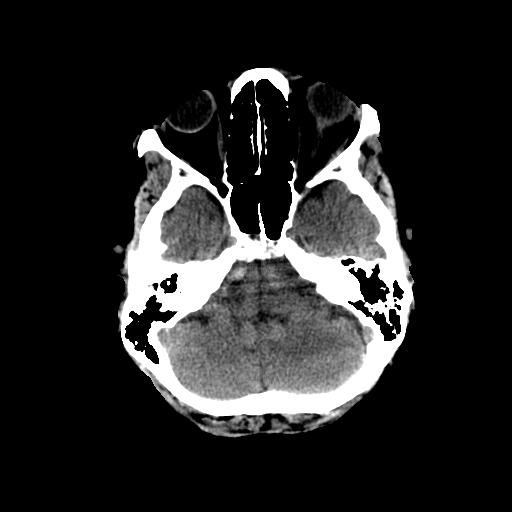
[im 9/32  brain]
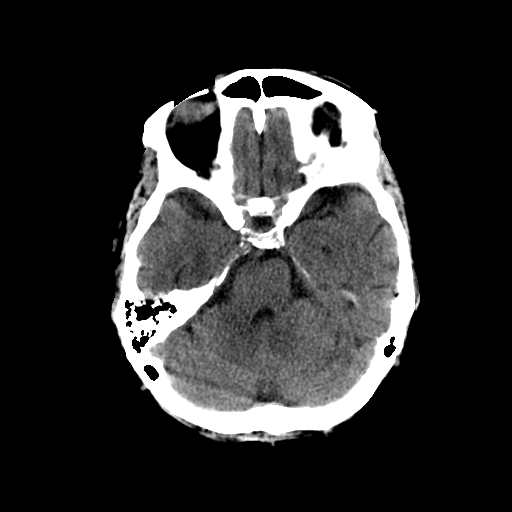
[im 12/32  brain]
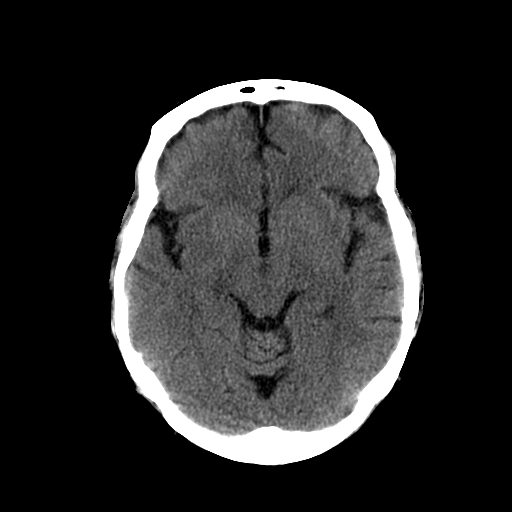
[im 12/32  bone]
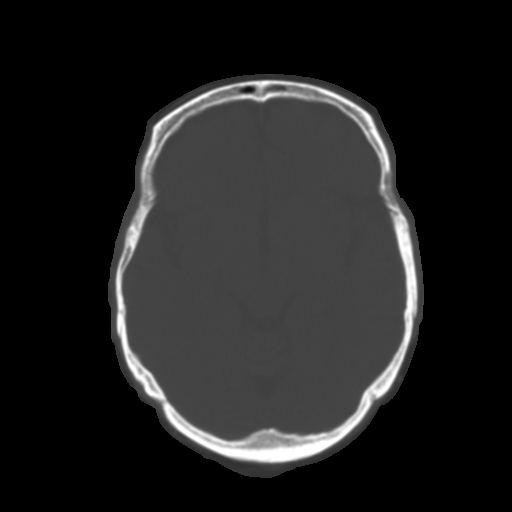
[im 14/32  brain]
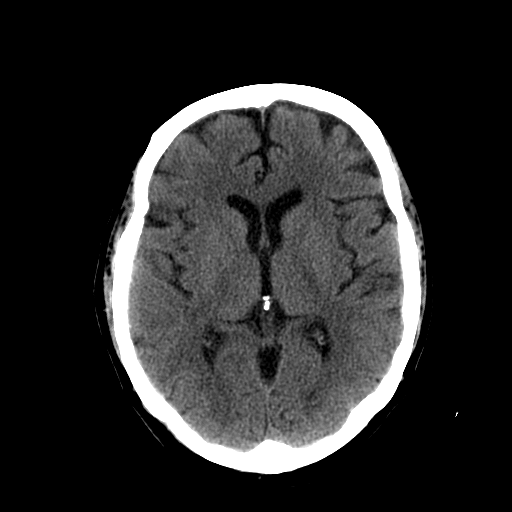
[im 16/32  brain]
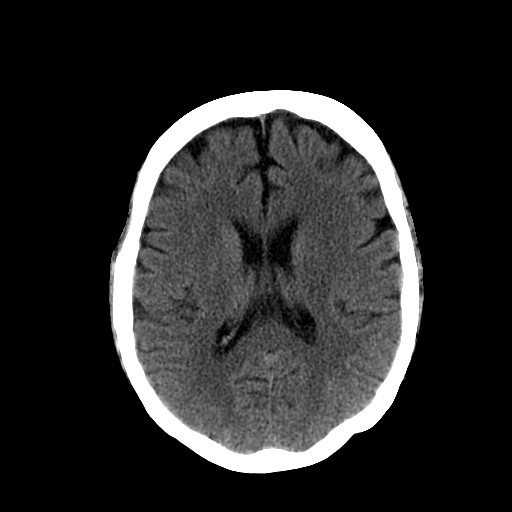
[im 18/32  brain]
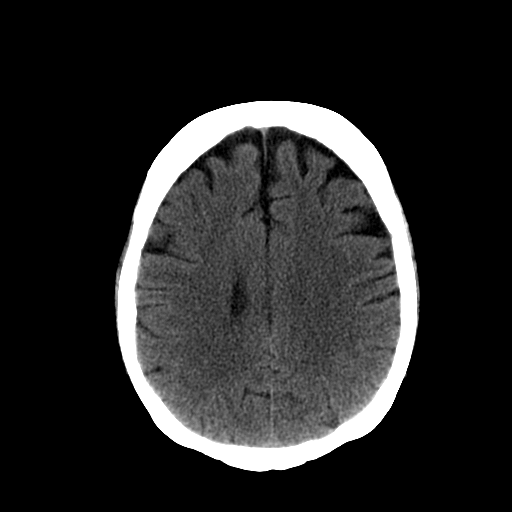
[im 20/32  brain]
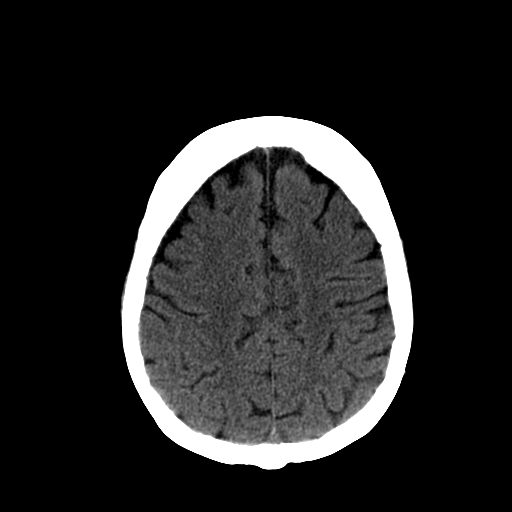
[im 20/32  bone]
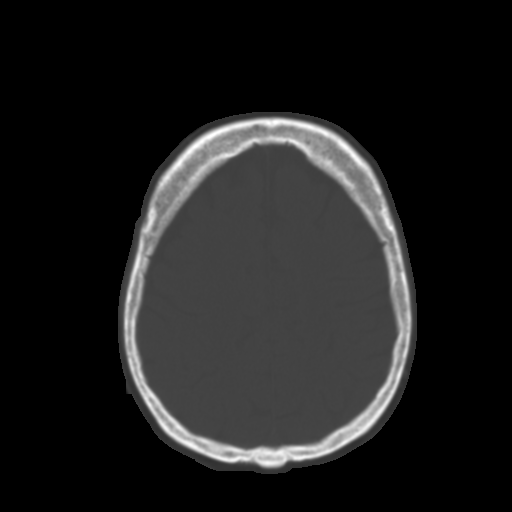
[im 23/32  brain]
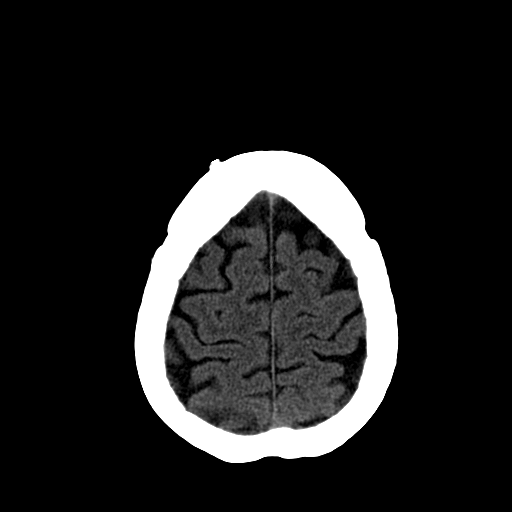
[im 25/32  brain]
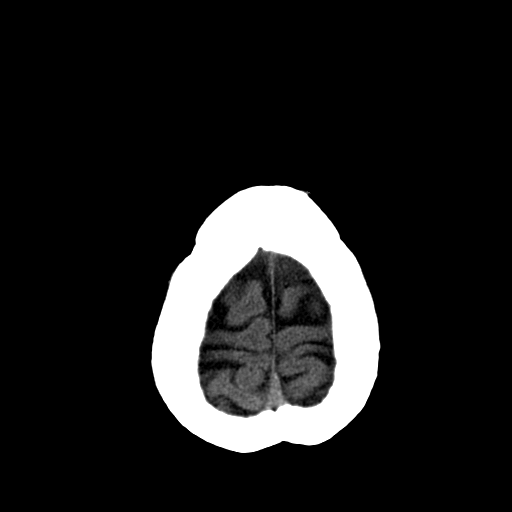
[im 27/32  brain]
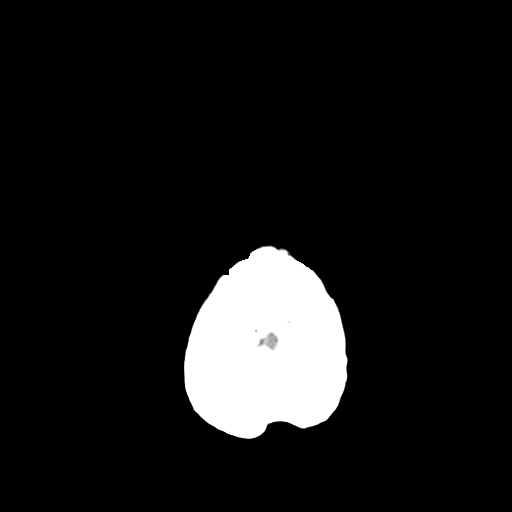
[im 29/32  brain]
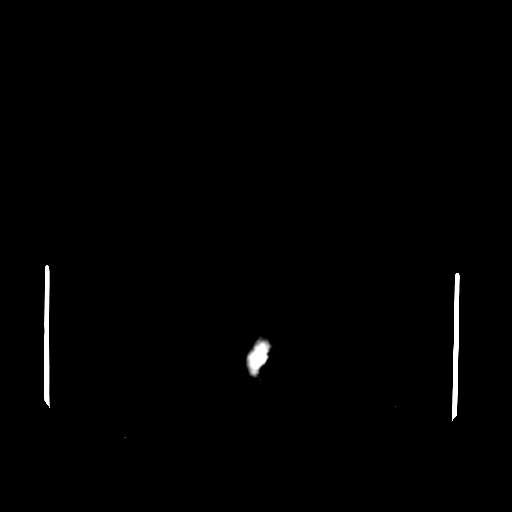
[im 29/32  bone]
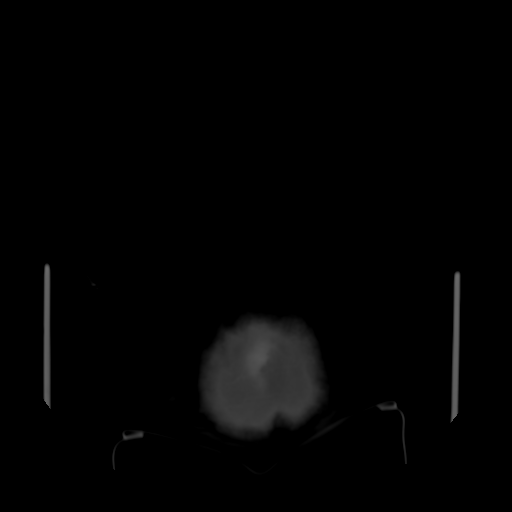

[13 of 30 positions shown; findings below may reference images not displayed]

FINDINGS: No acute intracranial abnormalities are identified,
including mass lesion or mass effect, hydrocephalus, extra-axial
fluid collection, midline shift, hemorrhage, or acute infarction.

The visualized bony calvarium is unremarkable.
IMPRESSION: No evidence of acute intracranial abnormality.

## 2014-12-05 ENCOUNTER — Other Ambulatory Visit: Payer: Self-pay | Admitting: Family Medicine

## 2014-12-05 MED ORDER — DILTIAZEM HCL ER BEADS 240 MG PO CP24: 240.0000 mg | ORAL_CAPSULE | Freq: Every day | ORAL | Status: DC

## 2014-12-05 MED ORDER — LISINOPRIL 2.5 MG PO TABS: 2.5000 mg | ORAL_TABLET | Freq: Every day | ORAL | Status: DC

## 2014-12-14 ENCOUNTER — Encounter: Payer: Self-pay | Admitting: Pharmacist

## 2015-01-01 ENCOUNTER — Non-Acute Institutional Stay (INDEPENDENT_AMBULATORY_CARE_PROVIDER_SITE_OTHER): Payer: Medicare Other | Admitting: Family Medicine

## 2015-01-01 DIAGNOSIS — R0781 Pleurodynia: Secondary | ICD-10-CM | POA: Insufficient documentation

## 2015-01-01 NOTE — Assessment & Plan Note (Signed)
New onset pain following being lifted up in bed. Likely MS in origin. Normal respiratory rate and is not tachycardic making PE an unlikely cause. Normal lung exam and afebrile making infectious etiology less likely. Given increased dose of tramadol over the weekend. Will continue to monitor at this time. If develops dyspnea, chest pain, or fever or vital sign abnormalities will need to consider further work-up.

## 2015-01-01 NOTE — Progress Notes (Signed)
Patient ID: Krista [name], female   DOB: 1956/03/30, 59 y.o.   MRN: [id number]  Marikay AlarEric Lizabeth Fellner, MD Phone: [phone]  Krista [name] is a 59 y.o. female who is seen in follow-up at the nursing home.  Left lower rib pain started 2 days ago after being lifted up in bed. Notes she felt an ache following this action. No sharp pain. Is in lower left ribs. No bruising noted. Has been intermittent. No fall or direct blow to the area. No shortness of breath or chest pain. Notes it does hurt with a deep breath.    ROS: Per HPI   Physical Exam Filed Vitals:   01/01/15 1026  BP: 148/77  Pulse: 76  Temp: 98.2 F (36.8 C)  Resp: 19    Gen: Well NAD, sitting up in chair with no difficulty Lungs: CTABL Nl WOB Heart: RRR  MSK: no bruising noted over left ribs, no swelling noted, there is tenderness to palpation of the left low ribs Exts: Non edematous BL  LE, warm and well perfused.    Assessment/Plan: Patient with likely muscle strain related to moving around bed. Has pain medication on board already. Can apply heat. Advised of precautions to inform nursing of.   Marikay AlarEric Jaishawn Witzke, MD [facility] PGY[facility]

## 2015-01-03 ENCOUNTER — Other Ambulatory Visit: Payer: Self-pay | Admitting: Family Medicine

## 2015-01-03 MED ORDER — CARVEDILOL 3.125 MG PO TABS: 3.1250 mg | ORAL_TABLET | Freq: Two times a day (BID) | ORAL | Status: DC

## 2015-01-12 ENCOUNTER — Encounter: Payer: Self-pay | Admitting: Family Medicine

## 2015-01-12 ENCOUNTER — Non-Acute Institutional Stay: Payer: Medicare Other | Admitting: Family Medicine

## 2015-01-12 DIAGNOSIS — R531 Weakness: Secondary | ICD-10-CM | POA: Diagnosis not present

## 2015-01-12 DIAGNOSIS — G629 Polyneuropathy, unspecified: Secondary | ICD-10-CM

## 2015-01-12 DIAGNOSIS — E1142 Type 2 diabetes mellitus with diabetic polyneuropathy: Secondary | ICD-10-CM | POA: Diagnosis not present

## 2015-01-12 DIAGNOSIS — I214 Non-ST elevation (NSTEMI) myocardial infarction: Secondary | ICD-10-CM | POA: Diagnosis not present

## 2015-01-12 DIAGNOSIS — R928 Other abnormal and inconclusive findings on diagnostic imaging of breast: Secondary | ICD-10-CM

## 2015-01-12 DIAGNOSIS — F259 Schizoaffective disorder, unspecified: Secondary | ICD-10-CM

## 2015-01-12 MED ORDER — CARVEDILOL 6.25 MG PO TABS: 6.2500 mg | ORAL_TABLET | Freq: Two times a day (BID) | ORAL | Status: AC

## 2015-01-12 NOTE — Addendum Note (Signed)
Addended byPerley Jain: MCDIARMID, TODD D on: 01/12/2015 03:41 PM   Modules accepted: Orders

## 2015-01-12 NOTE — Assessment & Plan Note (Signed)
Lab Results  Component Value Date   HGBA1C 7.5* 06/12/2014   CBGs needing 52 units SSI in 10 days. Time to recheck A1C Given NSTEMI status, may be a candidate for empagliflozin, if A1c not at goal.

## 2015-01-12 NOTE — Assessment & Plan Note (Signed)
Stable. No evidence ongoing ischemia. Patient tolerating Carvedilol start 3.125 mg TWICE DAILY.  Pulse rate well above 60 bpm. Increasing to Carvedilol 6.25 mg TWICE DAILY and monitor response. Patient already on Lisinopril.

## 2015-01-12 NOTE — Assessment & Plan Note (Signed)
Received [facility] from [facility] today stating that she had abnoraml evaluation 03/10/2014 but they were unable to reach her to ensure return for f/u exam. Patient and sister have declined further workup.

## 2015-01-12 NOTE — Progress Notes (Signed)
Patient ID: Krista [name], female   DOB: 1956/03/03, 59 y.o.   MRN: [id number] [facility]  Visit  Provider: Judie Petit. Benjamin Stain, MD Location of Care: [facility] Visit Information: a scheduled routine follow-up visit Patient accompanied by patient Source(s) of information for visit: patient, nursing home and past medical records  Nursing Concerns: none. Nutrition concerns: wieght gain, eating 75 to 100% of meals Wound Care: Sacral Pressure ulcer 1.8 cm x 2.6 cm pale open erythematous wound.  On Air mattress.  PT concern: Pt is participating in restorative care 5 times a week.  Chief Complaint:  Chief Complaint  Patient presents with  . Regulatory Visit    HISTORY OF PRESENT ILLNESS: Problem List Items Addressed This Visit      Schizoaffective disorder - Longstanding diagnosis - On Latuda 40 mg daily and bupropion - (+) weight gain(+) truncal obesity, (+) Hypertension, (+) DMT2 - No active delusions or hallucination   HYPERTENSION, BENIGN SYSTEMIC - Chronic problem - No chest pain, shortness of breath, new face or limb weakness or numbness - taking Coreg 3.125 twice a day and Lisinopril 10 mg daily - Denies leg edema and denies cough - No excericse.  On no-added salt diet.    History of DVT (deep vein thrombosis) - Last DVT was in Right leg in July 2014. - Has vena cava filter. On rivaroxaban daily - No bleeding.  No blood in stool  - no new swelling in legs.  No shortness of breath or chest pains.     Encopresis - Primary - Longstanding issue since admission to NH - Patient lack awareness when she defecates.  - No diarrhea.  Stools soft but formed.  - Contributory risk factor for sacral pressure ulcer, along with patient's urinary incontinence.    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  - No fever - (+) bowel and bladder incontinence - Stays in bed much of the time, can transfer to wheel chair.  Outpatient Encounter Prescriptions as of 01/12/2015  Medication Sig  . acetaminophen (TYLENOL) 650 MG CR tablet Take 650 mg by mouth 3 (three) times daily.   Marland Kitchen albuterol (PROVENTIL HFA;VENTOLIN HFA) 108 (90 BASE) MCG/ACT inhaler Inhale 2 puffs into the lungs every 4 (four) hours as needed for wheezing or shortness of breath.  . bisacodyl (DULCOLAX) 10 MG suppository Place rectally as needed for moderate constipation.  Marland Kitchen buPROPion (WELLBUTRIN SR) 150 MG 12 hr tablet Take 150 mg by mouth daily. 1 tab daily for depression  . calcium citrate (CALCITRATE - DOSED IN MG ELEMENTAL CALCIUM) 950 MG tablet Take 400 mg of elemental calcium by mouth daily.  . carvedilol (COREG) 3.125 MG tablet Take 1 tablet (3.125 mg total) by mouth 2 (two) times daily with a meal.  . Cholecalciferol (VITAMIN D3) 50000 UNITS CAPS Take 50,000 Units by mouth every 30 (thirty) days.  . famotidine (PEPCID) 40 MG tablet Take 40 mg by mouth at bedtime.  . fluticasone (VERAMYST) 27.5 MCG/SPRAY nasal  spray Place 2 sprays into the nose daily.  . insulin aspart (NOVOLOG) 100 UNIT/ML injection Inject 0-12 Units into the skin 3 (three) times daily before meals. Sliding scale: 0-150: 0 units 151-200: 2 units 201-250: 4 units 251-300: 6 units 301-350: 8 units 351-400: 10 units 401 and greater: 12 units  . insulin detemir (LEVEMIR) 100 UNIT/ML injection Inject 8 Units into the skin 2 (two) times daily.  Marland Kitchen lidocaine (LIDODERM) 5 % Place 1 patch onto the skin daily. Apply to left knee Remove & Discard patch within 12 hours or as directed by MD  . lisinopril (PRINIVIL,ZESTRIL) 10  MG tablet Take 10 mg by mouth daily.  Marland Kitchen lurasidone (LATUDA) 40 MG TABS tablet Take 40 mg by mouth daily with breakfast.   . metFORMIN (GLUMETZA) 500 MG (MOD) 24 hr tablet Take 1,000 mg by mouth 2 (two) times daily with a meal.   . Multiple Vitamin (MULTIVITAMIN WITH MINERALS) TABS Take 1 tablet by mouth daily.  . polyethylene glycol powder (GLYCOLAX/MIRALAX) powder Take 17 g by mouth daily.  . Rivaroxaban (XARELTO) 20 MG TABS tablet Take 1 tablet (20 mg total) by mouth daily.  . traMADol (ULTRAM) 50 MG tablet Take 1 tablet (50 mg total) by mouth every 8 (eight) hours as needed.  Marland Kitchen atorvastatin (LIPITOR) 40 MG tablet Take 1 tablet (40 mg total) by mouth daily. (Patient not taking: Reported on 01/12/2015)  . Magnesium Hydroxide (MILK OF MAGNESIA PO) Take by mouth.   History Patient Active Problem List   Diagnosis Date Noted  . Rib pain on left side 01/01/2015  . Encopresis 11/14/2014  . Abnormal mammogram 11/14/2014  . Cough 11/01/2014  . History of DVT (deep vein thrombosis) 06/22/2013  . Weakness 05/18/2013  . CVA (cerebral vascular accident) 05/17/2013  . Presence of IVC filter 05/17/2013  . Diabetic peripheral neuropathy associated with type 2 diabetes mellitus 10/07/2012  . 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 Hyperlipidemia 02/16/2008  . DEGENERATIVE JOINT DISEASE, BOTH KNEES, SEVERE 10/12/2007  . Diabetes mellitus with diabetic retinopathy 12/31/2006  . HYPERTRIGLYCERIDEMIA 12/31/2006  . Obesity 12/31/2006  . Schizoaffective disorder 12/31/2006  . HYPERTENSION, BENIGN SYSTEMIC 12/31/2006  . GASTROESOPHAGEAL REFLUX, NO ESOPHAGITIS 12/31/2006  . HERNIA, HIATAL, NONCONGENITAL 12/31/2006  . INCONTINENCE, URGE 12/31/2006   Past Medical History  Diagnosis Date  . Arthritis   . Hyperlipidemia   . Hypertension   . Allergy   . GERD (gastroesophageal reflux disease)   . Back pain   . Type II diabetes mellitus   . Schizophrenia     [name] 02/17/2013  .  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    RLE DVT   . Anemia, chronic disease 05/18/2013    Most likely cause given recent illness and chronic sacral ulcer   . Gastrostomy tube in place 06/22/2013    04/08/2013: Placement of percutaneous 69F pull-through gastrostomy tube. For severe protein calorie malnutrition.    . Abdominal pain 11/25/2013  . Chest pain 01/31/2013  . Elevated alkaline phosphatase level 08/18/2013  . Gastrostomy tube in place 06/22/2013    04/08/2013: Placement of percutaneous 69F pull-through gastrostomy tube. For severe protein calorie malnutrition.    Marland Kitchen PEG (percutaneous endoscopic gastrostomy) status 06/22/2013  . Presence of IVC filter 05/17/2013    Placed for DVT with hematuria on anticoagulation   . Schizoaffective disorder 12/31/2006    Qualifier: Diagnosis of  By: Haydee Salter    . Septic shock(785.52) 03/11/2013  . Severe sepsis with septic shock 01/24/2013  .  TOBACCO DEPENDENCE 12/31/2006    Quit date 03/09/2013-most recent hospitalization.     . Wheezing symptom 02/12/2012  . Acute kidney injury 01/24/2013  . Hematuria, gross 01/20/2013  . Sacral decubitus ulcer 05/17/2013  . SACROILIITIS, RIGHT 07/19/2010    Qualifier: Diagnosis of  By: Humberto SealsSaxon NP, Darl PikesSusan    . Skin abrasion 05/17/2013  . Acute delirium 01/24/2013  . Rectal trauma 10/07/2012  . Acute respiratory failure 03/25/2013  . HCAP (healthcare-associated pneumonia) 03/25/2013  . SINUS TACHYCARDIA 02/15/2010    Qualifier: Diagnosis of  By: Lanier PrudeBolden  MD, Cathrine Musteraineisha    . Tracheostomy status 04/04/2013  . Acute pain of left shoulder 02/15/2014  . Encopresis 11/14/2014   Past Surgical History  Procedure Laterality Date  . Tubal ligation    . Tracheostomy  03/25/2013    during prolonged hospitalization when patient was intubated and required bronchoscopy  . Gastrostomy tube placement  04/08/2013     Placement of percutaneous 44F pull-through gastrostomy tube. For severe protein calorie malnutrition.    Family History  Problem Relation Age of  Onset  . Diabetes Mother   . COPD Mother   . Heart disease Mother   . Hyperlipidemia Mother   . Hypertension Mother   . Arthritis Mother     reports that she has quit smoking. Her smoking use included Cigarettes. She smoked 1.00 pack per day. She has never used smokeless tobacco. She reports that she does not drink alcohol or use illicit drugs.  Basic Activities of Daily Living  Dependent or needs assistance in dressing, bathing, toileting and meal prep  Instrumental Activities of Daily Living Dependent or needs assistance in finances, housekeeping, shopping, telephoning and travel outside home    Falls in the past six months:   no  Diet:  general Supplemental shakes:  no  Review of Systems  See HPI General: Denies fevers Eyes: Denies  blurred vision Cardiovascular: Denies complaints, chest pains, palpitations, dyspnea on exertion, orthopnea, peripheral edema.  Respiratory: Denies cough, sputum, dyspnea.  Gastrointestinal: Denies abd pain, bloating, constipation, diarrhea.    PHYSICAL EXAM:. Wt Readings from Last 3 Encounters:  01/04/15 214 lb (97.07 kg)  07/18/14 208 lb (94.348 kg)  02/11/14 183 lb (83.008 kg)   Temp Readings from Last 3 Encounters:  01/06/15 97.7 F (36.5 C)   01/01/15 98.2 F (36.8 C)   11/11/14 97 F (36.1 C)    BP Readings from Last 3 Encounters:  01/06/15 128/79  01/01/15 148/77  11/11/14 138/80   Pulse Readings from Last 3 Encounters:  01/06/15 82  01/01/15 76  11/11/14 81    General: No acute distress, well nourished, pleasant, lying on left side flat without difficuty talking or increase WOB HEENT:  No scleral icterus, no nasal secretions, PERRL, pupils 2 to 3 mm bilaterally,  CV:  RRR, no murmur, no ankle swelling RESP: No resp distress or accessory muscle use.  Clear to ausc bilat. No wheezing, no rales, no rhonchi.  ABD:  Soft, Non-tender, non-distended, +bowel sounds, no masses MSK:  No back pain, no joint pain.  No joint  swelling or redness Psych:  Cooperative, Social conversation,    Assessment and Plan:   See Problem List for individual problem's assessment and plans.   Code Status:    (+)DNR Emergency contact:  While patient's mother, Leodis BinetShirley Ward, is listed as primary contact, Ms Wards diminished capacities may limit her ability to assist.   Follow Up:  Next 60  days unless acute issues arise.  Patient ID: Krista [name], female   DOB: Mar 11, 1956, 59 y.o.   MRN: [id number]

## 2015-01-12 NOTE — Assessment & Plan Note (Signed)
Sedentary with most of day in bed watching TV. Developing complications of inactivity such as ongoing weight gain and sacral pressure ulcers.  Patient is participating in restorative care 5 times a week.  Will recommend an additional 150 mg of Bupropion SR either in morning or by 2 pm. Goal of increasing motivation to activity and curb weight gain.

## 2015-01-12 NOTE — Assessment & Plan Note (Addendum)
Stable. Tolerating Lurasidone except for metabolic changes of weight gain, glycemic dysregulation, truncal obesity.  Will continue Lurasidone for now. Consider a Gradual Dose Reduction next Regulatory Visit.

## 2015-01-15 ENCOUNTER — Encounter: Payer: Self-pay | Admitting: Family Medicine

## 2015-01-15 DIAGNOSIS — N183 Chronic kidney disease, stage 3 unspecified: Secondary | ICD-10-CM | POA: Insufficient documentation

## 2015-01-22 ENCOUNTER — Encounter: Payer: Self-pay | Admitting: Pharmacist

## 2015-01-25 LAB — BASIC METABOLIC PANEL
BUN: 21 mg/dL (ref 4–21)
CREATININE: 0.8 mg/dL (ref <=1.1)
GLUCOSE: 133 mg/dL
Potassium: 4.5 mmol/L (ref 3.4–5.3)
Sodium: 136 mmol/L — AB (ref 137–147)

## 2015-02-08 LAB — BASIC METABOLIC PANEL
BUN: 17 mg/dL (ref 4–21)
Creatinine: 0.9 mg/dL (ref 0.5–1.1)
GLUCOSE: 161 mg/dL
Potassium: 4.4 mmol/L (ref 3.4–5.3)
Sodium: 137 mmol/L (ref 137–147)

## 2015-02-12 ENCOUNTER — Encounter: Payer: Self-pay | Admitting: Family Medicine

## 2015-02-14 ENCOUNTER — Non-Acute Institutional Stay (INDEPENDENT_AMBULATORY_CARE_PROVIDER_SITE_OTHER): Payer: Medicare Other | Admitting: Family Medicine

## 2015-02-14 DIAGNOSIS — J069 Acute upper respiratory infection, unspecified: Secondary | ICD-10-CM

## 2015-02-14 NOTE — Progress Notes (Signed)
Krista [name] is a 59 y.o. female seen today for ongoing sore throat/cough.  Sore throat/cough - Ongoing now for about 3-4 days.  Sore throat, increase mucous production, denies fever, chills, or sweats, denies productive cough, dyspnea, CP.  No changes in appetite or output.  Has not tried anything for this at this time.   Current Outpatient Prescriptions on File Prior to Visit  Medication Sig Dispense Refill  . acetaminophen (TYLENOL) 650 MG CR tablet Take 650 mg by mouth 3 (three) times daily.     Marland Kitchen albuterol (PROVENTIL HFA;VENTOLIN HFA) 108 (90 BASE) MCG/ACT inhaler Inhale 2 puffs into the lungs every 4 (four) hours as needed for wheezing or shortness of breath.    Marland Kitchen atorvastatin (LIPITOR) 40 MG tablet Take 1 tablet (40 mg total) by mouth daily. 90 tablet 3  . bisacodyl (DULCOLAX) 10 MG suppository Place rectally as needed for moderate constipation.    Marland Kitchen buPROPion (WELLBUTRIN SR) 150 MG 12 hr tablet Take 150 mg by mouth daily. 1 tab daily for depression    . calcium citrate (CALCITRATE - DOSED IN MG ELEMENTAL CALCIUM) 950 MG tablet Take 400 mg of elemental calcium by mouth daily.    . carvedilol (COREG) 6.25 MG tablet Take 1 tablet (6.25 mg total) by mouth 2 (two) times daily with a meal.    . Cholecalciferol (VITAMIN D3) 50000 UNITS CAPS Take 50,000 Units by mouth every 30 (thirty) days.    . famotidine (PEPCID) 40 MG tablet Take 40 mg by mouth at bedtime.    . fluticasone (VERAMYST) 27.5 MCG/SPRAY nasal spray Place 2 sprays into the nose daily.    . insulin aspart (NOVOLOG) 100 UNIT/ML injection Inject 0-12 Units into the skin 3 (three) times daily before meals. Sliding scale: 0-150: 0 units 151-200: 2 units 201-250: 4 units 251-300: 6 units 301-350: 8 units 351-400: 10 units 401 and greater: 12 units    . insulin detemir (LEVEMIR) 100 UNIT/ML injection Inject 8 Units into the skin 2 (two) times daily.    Marland Kitchen lidocaine (LIDODERM) 5 % Place 1 patch onto the skin daily. Apply to left  knee Remove & Discard patch within 12 hours or as directed by MD    . lisinopril (PRINIVIL,ZESTRIL) 10 MG tablet Take 10 mg by mouth daily.    Marland Kitchen lurasidone (LATUDA) 40 MG TABS tablet Take 40 mg by mouth daily with breakfast.     . Magnesium Hydroxide (MILK OF MAGNESIA PO) Take by mouth.    . metFORMIN (GLUMETZA) 500 MG (MOD) 24 hr tablet Take 1,000 mg by mouth 2 (two) times daily with a meal.     . Multiple Vitamin (MULTIVITAMIN WITH MINERALS) TABS Take 1 tablet by mouth daily.    . polyethylene glycol powder (GLYCOLAX/MIRALAX) powder Take 17 g by mouth daily. 3350 g 1  . Rivaroxaban (XARELTO) 20 MG TABS tablet Take 1 tablet (20 mg total) by mouth daily. 30 tablet   . traMADol (ULTRAM) 50 MG tablet Take 1 tablet (50 mg total) by mouth every 8 (eight) hours as needed. 30 tablet 0   No current facility-administered medications on file prior to visit.    ROS: Per HPI.  All other systems reviewed and are negative.   Physical Exam Filed Vitals:   02/14/15 1217  BP: 133/91  Pulse: 95  Temp: 98.7 F (37.1 C)  Resp: 20    Physical Examination: General appearance - alert, well appearing, and in no distress Mouth - mucous membranes moist, pharynx  normal without lesions Neck - no LAD  Chest - clear to auscultation, no wheezes, rales or rhonchi, symmetric air entry Heart - normal rate and regular rhythm   A/P 1) Viral URI  Tx with Mucinex 600 mg BID, albuterol 2 puffs q 4-6 hrs PRN cough.  No evidence of PNA on PE or Hx, but continue to follow.

## 2015-02-26 ENCOUNTER — Encounter: Payer: Self-pay | Admitting: Pharmacist

## 2015-03-08 ENCOUNTER — Encounter: Payer: Self-pay | Admitting: Family Medicine

## 2015-03-26 ENCOUNTER — Encounter: Payer: Self-pay | Admitting: Student-PharmD

## 2015-03-30 ENCOUNTER — Non-Acute Institutional Stay: Payer: Medicare Other | Admitting: Family Medicine

## 2015-03-30 DIAGNOSIS — L89152 Pressure ulcer of sacral region, stage 2: Secondary | ICD-10-CM | POA: Diagnosis not present

## 2015-03-30 DIAGNOSIS — E669 Obesity, unspecified: Secondary | ICD-10-CM

## 2015-03-30 DIAGNOSIS — F259 Schizoaffective disorder, unspecified: Secondary | ICD-10-CM

## 2015-03-30 DIAGNOSIS — E781 Pure hyperglyceridemia: Secondary | ICD-10-CM | POA: Diagnosis not present

## 2015-03-30 DIAGNOSIS — E11319 Type 2 diabetes mellitus with unspecified diabetic retinopathy without macular edema: Secondary | ICD-10-CM

## 2015-03-30 DIAGNOSIS — I639 Cerebral infarction, unspecified: Secondary | ICD-10-CM

## 2015-03-30 DIAGNOSIS — Z86718 Personal history of other venous thrombosis and embolism: Secondary | ICD-10-CM

## 2015-03-30 DIAGNOSIS — I10 Essential (primary) hypertension: Secondary | ICD-10-CM

## 2015-03-30 DIAGNOSIS — E785 Hyperlipidemia, unspecified: Secondary | ICD-10-CM

## 2015-04-01 ENCOUNTER — Encounter: Payer: Self-pay | Admitting: Family Medicine

## 2015-04-01 DIAGNOSIS — L89159 Pressure ulcer of sacral region, unspecified stage: Secondary | ICD-10-CM | POA: Insufficient documentation

## 2015-04-01 NOTE — Assessment & Plan Note (Signed)
Continue lipitor and ordered recheck in June. 

## 2015-04-01 NOTE — Assessment & Plan Note (Signed)
Obesity - weight listed as 206.8lbs 5/4, down from 214 3/3, though nursing states that is likely an error as they do not feel she has lost any weight. She is mostly bed-bound but states she tries to get up regularly (out of bed, in wheelchair). -Continued to encourage better dietary habits, limit portion sizes. -Get up frequently for weight and h/o sacral decubitus ulcer -Taper down latuda and recent increase in wellbutrin. -DM and HTN f/u per those problem items.

## 2015-04-01 NOTE — Assessment & Plan Note (Signed)
Continue lipitor and ordered recheck in June.

## 2015-04-01 NOTE — Assessment & Plan Note (Signed)
Diabetes control: A1c 7.5 06/2014; checked 3/16 - will check result. Last creatinine 0.9 02/2015.  -Medications: Levemir and SSI; Consider empagliflozin with NSTEMI hx if A1c not at goal checked 01/2015. -Heart: Continue xarelto (so no aspirin), atorvastatin, and lisinopril. Diet/exercise discussed though difficult with negative schizophrenia symptoms. Will attempt to taper latuda. Lipid panel due June 2016 (ordered) -Kidneys: normal 02/2015.  -Feet: Repeat foot exam due about 10 months.  -Eyes: Need optho outpatient eval for h/o DM retinopathy  -Follow-up: 2 months for next A1c.

## 2015-04-01 NOTE — Assessment & Plan Note (Signed)
CVA stable. Tolerating rivaroxaban and statin. - Needs improved BP control. Currently on diltiazem and lisinopril $RemoveBefor [id number]$[id number]$20mg

## 2015-04-01 NOTE — Assessment & Plan Note (Signed)
Worsened since December likely due to increased weight. Shallow ulceration, open. Difficult to heal due to pt inactivity. No fevers/chills or purulence. -Continue daily dressing changes and weekly wound care team visits. -Continue to urge pt out of bed. Will decrease latuda this visit (20mg ) and try to taper off in 2 weeks. -Wellbutrin also recently increased.

## 2015-04-01 NOTE — Assessment & Plan Note (Signed)
Stable, chronic.  -Continue rivaroxaban.

## 2015-04-01 NOTE — Assessment & Plan Note (Signed)
Control: BP elevated, most recently 145/63.  -Medications: Lisinopril, increase to 20mg  daily; diltiazem 360mg  daily -Renal function: 0.9 02/2015. Check in 2 weeks given increase in lisinopril. -Diet/exercise: Constant struggle with Ms [name] as she likes to eat and lay in bed with negative symptoms from schizophrenia and with latuda. Will attempt to decrease latuda. -Tobacco use: Former smoker - F/u: 2 months.

## 2015-04-01 NOTE — Assessment & Plan Note (Signed)
Negative symptoms and metabolic syndrome from latuda were likely partly to blame for worsening obesity.  -Latuda 40mg  daily, psychiatry added wellbutrin XL 150mg  daily fall 2015.  -Added additional 150mg  of bupropion SR in morning 01/12/15 to increase motivation to activity/decrease weight gain. -Gradual latuda reduction, begin this visit down to 20mg  x 2 weeks. Then plan to stop if doing well.

## 2015-04-01 NOTE — Progress Notes (Signed)
Patient ID: Krista ElizabethDebra J Domingos, female   DOB: [birth year], 59 y.o.   MRN: [id number] Subjective:   CC: Nursing home visit  HPI:   This is a 59 y.o. female who I am visiting in the Nursing Home.   F/u Diabetes Mellitus Medications: SSI and levemir. Denies side effects. Blood sugar control: mostly 170s-low 300. Sx of hypoglycemia: None. Sx of hyperglycemia: None though pt incontinent so difficult to determine. End-organ symptoms: No CP or dyspnea  Heart: On xarelto so no asa. Taking atorvastatin and lisinopril. Lipids last checked 06/2014. Kidneys: Creatinine last checked and normal 02/2015. Feet: Checked recently. Needs next check in ~10 months. Eyes: Due for ophthalmologist f/u of DM retinopathy.  Follow-up hypertension Blood pressures at NH: 145/63 most recently, 130s-160s SBP since Dec 2015. Medications: lisinopril 10mg  daily ,diltiazem. Side effects: None reported. End-organ symptoms: CP/SOB? No.  Renal function: 0.9 02/2015. Diet/exercise: Minimal exercise; 75-100% of each meal. Tobacco use: Former smoker.  Obesity Schizophrenia Per nursing, Stanton KidneyDebra does not want to get out of bed often but does at least for the morning, staying in her wheelchair. She eats "too much" per nursing and they doubt any weight loss. Per patient, she has been trying to cut down on portion sizes (info elicited after I asked her if she was trying to lose weight).  Pressure ulcer According to [place], no pain. According to her nurse, wound care team sees pt weekly and dressing is changed daily.    Review of Systems - Per HPI. Additionally, back pain but no fevers, chills, or change in range of motion.  PMH: Schizoaffective disorder, obesity, hypertriglyceridemia, HTN, HLD, h/o DVT, hiatal hernia, GERD, DM type II with retinopathy and peripheral neuropathy, DJD bilateral knees, CVA.   Med[facility] - [facility]    Objective:  Physical Exam BP 145/63 mmHg  Pulse 83  Temp(Src) 96.9  F (36.1 C)  Resp 19  Wt 206 lb 12.8 oz (93.804 kg)  LMP 12/31/2009 GEN: NAD CV: RRR, no m/r/g, 2+ B radial pulses PULM: CTAB, normal effort ABD: S/NT/ND, morbidly obese EXTR: No LE edema or calf tenderness; spontaneous purposeful movements but muscles atrophied bilateral calves and quads SKIN: No rash or cyanosis; left medial buttocks /sacrum with 2cm shallow open ulceration with no surrounding erythema or purulence; no tenderness NEURO: Awake, alert, normal speech; no focal deficit, moderate core strength allowing pt to turn in bed easily PSYCH: Mood and affect euthymic    Assessment:     Krista [name] is a 59 y.o. female here for routine visit - f/u DM and HTN this visit.    Plan:     # See problem list and after visit summary for problem-specific plans.   # Health Maintenance:  -Ordered to f/u abnormal mammogram 11/14/14. Pt has since declined further workup. -Due for pap smear   Follow-up: Follow up in 2 months for next routine visit.   Leona SingletonMaria T Stpehen Petitjean, MD [facility]

## 2015-04-09 NOTE — Progress Notes (Signed)
I have seen and examined this patient. I have reviewed labs and imaging results.  I have discussed with Dr Benjamin Stainthekkekandam.  I agree with their findings and plans as documented in their regulatory note.

## 2015-04-09 NOTE — Addendum Note (Signed)
Addended byPerley Jain: Jerika Wales D on: 04/09/2015 10:50 AM   Modules accepted: Level of Service

## 2015-04-18 ENCOUNTER — Telehealth: Payer: Self-pay | Admitting: Family Medicine

## 2015-04-18 NOTE — Telephone Encounter (Signed)
Contacted by [facility] nurse regarding incident earlier this evening. Patient was pulling up on head of the bed and it broke and fell on her, she reports that it his her in the head. She has no visible injuries, no bump or bruise where she reports it hit. Vitals are stable and she is well appearing without neurological change. Nursing will continue neuro checks throughout the night and alert [place] of any change. No need for ED evaluation at this time.

## 2015-05-16 ENCOUNTER — Encounter: Payer: Self-pay | Admitting: Student

## 2015-05-16 LAB — LIPID PANEL
CHOLESTEROL, TOTAL: 131
HDL: 32 — AB (ref 46–?)
LDL (calc): 35
Triglycerides: 320 — ABNORMAL HIGH
VLDL Cholesterol Cal: 64 — ABNORMAL HIGH

## 2015-05-16 LAB — HEMOGLOBIN A1C: A1C: 7.3 — AB

## 2015-05-23 ENCOUNTER — Non-Acute Institutional Stay: Payer: Medicare Other | Admitting: Family Medicine

## 2015-05-23 DIAGNOSIS — E11319 Type 2 diabetes mellitus with unspecified diabetic retinopathy without macular edema: Secondary | ICD-10-CM | POA: Diagnosis not present

## 2015-05-23 DIAGNOSIS — L89153 Pressure ulcer of sacral region, stage 3: Secondary | ICD-10-CM

## 2015-05-23 NOTE — Assessment & Plan Note (Signed)
CBG data reviewed - likely less controlled diabetes than A1c of 7.3% on 6/16 would suggest, perhaps due to sacral wound. Needs tighter control for optimal wound healing. Will add mealtime insulin 2u qAC and follow up.

## 2015-05-23 NOTE — Assessment & Plan Note (Addendum)
Stage III: without evidence of infection. Continue foley catheter and dressing changes to keep borders dry and weekly wound care team visits.

## 2015-05-23 NOTE — Progress Notes (Signed)
Subjective: Krista [name] is a 59 y.o. female resident of [facility] being seen to evaluate sacral wound.   Pt reports some back pain, foley catheter is uncomfortable. No fevers.    Objective: Wt Readings from Last 3 Encounters:  03/24/15 206 lb 12.8 oz (93.804 kg)  01/04/15 214 lb (97.07 kg)  07/18/14 208 lb (94.348 kg)   Gen: Obese 59 y.o. female in no distress Skin: Approximately 8cm x 6cm wound overlying sacrum. Wet, irregular borders with some maceration. Undermining superiorly also noted. Wound bed into muscle with scar formation, some granulation tissue and slough.   Assessment/Plan: Krista [name] is a 59 y.o. female with sacral pressure ulcer.   See problem list for plan.

## 2015-05-24 ENCOUNTER — Encounter: Payer: Self-pay | Admitting: Family Medicine

## 2015-06-03 ENCOUNTER — Telehealth: Payer: Self-pay | Admitting: Family Medicine

## 2015-06-04 NOTE — Telephone Encounter (Signed)
Received call from [facility] this morning informing me that the patient had expired. Gave verbal order that it was ok to release the body to the funeral home. Staff at [facility] had no further questions.  Katina Degree. Jimmey Ralph, MD [facility] Resident PGY-2 06-15-2015 9:32 AM

## 2015-06-04 NOTE — Progress Notes (Signed)
Patient ID: Krista [name], female   DOB: 1956-09-06, 59 y.o.   MRN: [id number] I have seen and examined this patient with Dr Jarvis Newcomer  I have discussed with him the patient's sacral pressure ulcer.  I agree with their findings and plans as documented in their acute visit note.

## 2015-06-04 NOTE — Telephone Encounter (Addendum)
Records Reviewed: [facility] EMR and nursing report (interviewed 8/1 AM) is only significant for a recent room change (from the 100 hall to the 300 hall due to suspicion for bed bugs thought to be introduced by a family member). No complaints were made by the patient before she was found unresponsive in cardiac arrest on the morning of 08/24/24by the nurse aid. As she was a DNR, no attempts at resuscitation were made.  Vital signs recorded 7/30 at 6:16pm include oral temperature 102.36F, Hr 98 bpm, RR 22/min, BP 94/76. These were not reported to the medical staff.   Presumed time/place of death: 2015/06/27 at 06:15 in [facility] nursing home room 301.  Presumed cause of death: Cardiovascular event.   Discussed case with Medical Examiner, Volney Presser, this morning who, after reviewing the case, reports that this does not satisfy requirements for a postmortem examination.

## 2015-06-04 NOTE — Addendum Note (Signed)
Addended byPerley Jain, TODD D on: 06/04/2015 12:41 PM   Modules accepted: Level of Service

## 2015-06-04 DEATH — deceased

## 2015-06-25 ENCOUNTER — Encounter: Payer: Self-pay | Admitting: Student

## 2015-06-25 LAB — CBC
HCT: 41 %
HEMOGLOBIN: 13.2
MCH: 29.6
MCHC: 32.4
MCV: 91.3
MPV: 8.9 fL (ref 7.5–11.5)
RBC: 4.46
RDW: 13.8
WBC: 9.3
platelet count: 313

## 2015-06-25 LAB — PREALBUMIN

## 2015-06-25 LAB — COMPREHENSIVE METABOLIC PANEL
ALBUMIN - PEBFLD: 3.6
ALT: 24
AST: 18 U/L
Alkaline Phosphatase: 151 U/L — ABNORMAL HIGH
BILIRUBIN TOTAL: 0.4 mg/dL
BUN: 18 mg/dL (ref 4–21)
CO2: 23 mmol/L
CREATININE: 0.84
Calcium: 9 mg/dL
Chloride: 100 mmol/L
GLUCOSE: 243 — AB
Potassium: 4.6 mmol/L
SODIUM: 136
Total Protein: 6.3 g/dL

## 2015-06-25 LAB — TSH: TSH: 2.076
